# Patient Record
Sex: Female | Born: 1955 | Race: White | Hispanic: No | Marital: Married | Smoking: Never smoker
Health system: Southern US, Community
[De-identification: ages and names within clinical notes are randomized; demographics above are authoritative.]

## PROBLEM LIST (undated history)

## (undated) DIAGNOSIS — E079 Disorder of thyroid, unspecified: Secondary | ICD-10-CM

## (undated) DIAGNOSIS — I10 Essential (primary) hypertension: Secondary | ICD-10-CM

## (undated) DIAGNOSIS — H269 Unspecified cataract: Secondary | ICD-10-CM

## (undated) DIAGNOSIS — E785 Hyperlipidemia, unspecified: Secondary | ICD-10-CM

## (undated) DIAGNOSIS — K432 Incisional hernia without obstruction or gangrene: Secondary | ICD-10-CM

## (undated) DIAGNOSIS — F419 Anxiety disorder, unspecified: Secondary | ICD-10-CM

## (undated) DIAGNOSIS — Z8619 Personal history of other infectious and parasitic diseases: Secondary | ICD-10-CM

## (undated) DIAGNOSIS — E11620 Type 2 diabetes mellitus with diabetic dermatitis: Secondary | ICD-10-CM

## (undated) DIAGNOSIS — C649 Malignant neoplasm of unspecified kidney, except renal pelvis: Secondary | ICD-10-CM

## (undated) DIAGNOSIS — C801 Malignant (primary) neoplasm, unspecified: Secondary | ICD-10-CM

## (undated) DIAGNOSIS — G20A1 Parkinson's disease without dyskinesia, without mention of fluctuations: Secondary | ICD-10-CM

## (undated) DIAGNOSIS — G4730 Sleep apnea, unspecified: Secondary | ICD-10-CM

## (undated) DIAGNOSIS — D649 Anemia, unspecified: Secondary | ICD-10-CM

## (undated) DIAGNOSIS — M1990 Unspecified osteoarthritis, unspecified site: Secondary | ICD-10-CM

## (undated) DIAGNOSIS — K219 Gastro-esophageal reflux disease without esophagitis: Secondary | ICD-10-CM

## (undated) DIAGNOSIS — T7840XA Allergy, unspecified, initial encounter: Secondary | ICD-10-CM

## (undated) DIAGNOSIS — F32A Depression, unspecified: Secondary | ICD-10-CM

## (undated) HISTORY — DX: Disorder of thyroid, unspecified: E07.9

## (undated) HISTORY — DX: Hyperlipidemia, unspecified: E78.5

## (undated) HISTORY — PX: TUBAL LIGATION: SHX77

## (undated) HISTORY — DX: Type 2 diabetes mellitus with diabetic dermatitis: E11.620

## (undated) HISTORY — DX: Malignant (primary) neoplasm, unspecified: C80.1

## (undated) HISTORY — PX: JOINT REPLACEMENT: SHX530

## (undated) HISTORY — DX: Allergy, unspecified, initial encounter: T78.40XA

## (undated) HISTORY — PX: TONSILLECTOMY: SUR1361

## (undated) HISTORY — DX: Personal history of other infectious and parasitic diseases: Z86.19

## (undated) HISTORY — DX: Depression, unspecified: F32.A

## (undated) HISTORY — DX: Unspecified cataract: H26.9

## (undated) HISTORY — DX: Malignant neoplasm of unspecified kidney, except renal pelvis: C64.9

## (undated) HISTORY — DX: Anemia, unspecified: D64.9

---

## 1988-04-16 HISTORY — PX: OTHER SURGICAL HISTORY: SHX169

## 1989-04-16 HISTORY — PX: BREAST BIOPSY: SHX20

## 1992-04-16 HISTORY — PX: CHOLECYSTECTOMY: SHX55

## 1992-04-16 HISTORY — PX: APPENDECTOMY: SHX54

## 1992-04-16 HISTORY — PX: NEPHRECTOMY: SHX65

## 2004-09-19 ENCOUNTER — Ambulatory Visit: Payer: Self-pay | Admitting: Family Medicine

## 2004-09-22 ENCOUNTER — Ambulatory Visit: Payer: Self-pay | Admitting: Family Medicine

## 2005-04-30 ENCOUNTER — Ambulatory Visit: Payer: Self-pay | Admitting: Family Medicine

## 2006-09-25 ENCOUNTER — Ambulatory Visit: Payer: Self-pay | Admitting: Family Medicine

## 2007-07-16 ENCOUNTER — Ambulatory Visit: Payer: Self-pay | Admitting: Family Medicine

## 2007-07-16 DIAGNOSIS — I10 Essential (primary) hypertension: Secondary | ICD-10-CM | POA: Insufficient documentation

## 2007-07-16 DIAGNOSIS — E785 Hyperlipidemia, unspecified: Secondary | ICD-10-CM | POA: Insufficient documentation

## 2007-10-09 ENCOUNTER — Ambulatory Visit: Payer: Self-pay | Admitting: Family Medicine

## 2007-10-21 ENCOUNTER — Ambulatory Visit: Payer: Self-pay | Admitting: Family Medicine

## 2008-05-10 ENCOUNTER — Ambulatory Visit: Payer: Self-pay | Admitting: General Surgery

## 2008-08-06 ENCOUNTER — Ambulatory Visit: Payer: Self-pay | Admitting: Family Medicine

## 2008-08-06 DIAGNOSIS — F410 Panic disorder [episodic paroxysmal anxiety] without agoraphobia: Secondary | ICD-10-CM | POA: Insufficient documentation

## 2008-10-25 ENCOUNTER — Ambulatory Visit: Payer: Self-pay | Admitting: Family Medicine

## 2009-09-14 DIAGNOSIS — G4710 Hypersomnia, unspecified: Secondary | ICD-10-CM | POA: Insufficient documentation

## 2009-09-15 DIAGNOSIS — J309 Allergic rhinitis, unspecified: Secondary | ICD-10-CM | POA: Insufficient documentation

## 2009-09-15 DIAGNOSIS — B354 Tinea corporis: Secondary | ICD-10-CM | POA: Insufficient documentation

## 2009-11-07 ENCOUNTER — Ambulatory Visit: Payer: Self-pay | Admitting: Family Medicine

## 2009-12-21 ENCOUNTER — Ambulatory Visit: Payer: Self-pay | Admitting: Family Medicine

## 2011-01-01 ENCOUNTER — Ambulatory Visit: Payer: Self-pay | Admitting: Family Medicine

## 2011-02-12 ENCOUNTER — Ambulatory Visit: Payer: Self-pay | Admitting: Unknown Physician Specialty

## 2011-02-12 LAB — HM COLONOSCOPY

## 2012-05-20 ENCOUNTER — Ambulatory Visit: Payer: Self-pay | Admitting: Family Medicine

## 2012-05-20 LAB — HM MAMMOGRAPHY

## 2012-11-03 LAB — BASIC METABOLIC PANEL
BUN: 12 mg/dL (ref 4–21)
GLUCOSE: 106 mg/dL
POTASSIUM: 4.7 mmol/L (ref 3.4–5.3)
SODIUM: 139 mmol/L (ref 137–147)

## 2012-11-03 LAB — TSH: TSH: 3.61 u[IU]/mL (ref 0.41–5.90)

## 2014-01-20 LAB — BASIC METABOLIC PANEL: CREATININE: 1.1 mg/dL (ref <=1.1)

## 2014-01-20 LAB — LIPID PANEL
CHOLESTEROL: 163 mg/dL (ref 0–200)
HDL: 53 mg/dL (ref 35–70)
LDL Cholesterol: 88 mg/dL
TRIGLYCERIDES: 110 mg/dL (ref 40–160)

## 2014-01-20 LAB — HEMOGLOBIN A1C: Hgb A1c MFr Bld: 5.8 % (ref 4.0–6.0)

## 2014-09-22 ENCOUNTER — Encounter: Payer: Self-pay | Admitting: Family Medicine

## 2014-09-22 ENCOUNTER — Ambulatory Visit (INDEPENDENT_AMBULATORY_CARE_PROVIDER_SITE_OTHER): Payer: 59 | Admitting: Family Medicine

## 2014-09-22 VITALS — BP 110/70 | HR 120 | Temp 98.2°F | Resp 16 | Ht 60.0 in | Wt 228.0 lb

## 2014-09-22 DIAGNOSIS — C649 Malignant neoplasm of unspecified kidney, except renal pelvis: Secondary | ICD-10-CM | POA: Insufficient documentation

## 2014-09-22 DIAGNOSIS — R7303 Prediabetes: Secondary | ICD-10-CM | POA: Insufficient documentation

## 2014-09-22 DIAGNOSIS — E11620 Type 2 diabetes mellitus with diabetic dermatitis: Secondary | ICD-10-CM

## 2014-09-22 DIAGNOSIS — G4733 Obstructive sleep apnea (adult) (pediatric): Secondary | ICD-10-CM | POA: Insufficient documentation

## 2014-09-22 DIAGNOSIS — F410 Panic disorder [episodic paroxysmal anxiety] without agoraphobia: Secondary | ICD-10-CM

## 2014-09-22 DIAGNOSIS — N6310 Unspecified lump in the right breast, unspecified quadrant: Secondary | ICD-10-CM | POA: Insufficient documentation

## 2014-09-22 DIAGNOSIS — L299 Pruritus, unspecified: Secondary | ICD-10-CM | POA: Insufficient documentation

## 2014-09-22 DIAGNOSIS — L308 Other specified dermatitis: Secondary | ICD-10-CM | POA: Insufficient documentation

## 2014-09-22 HISTORY — DX: Type 2 diabetes mellitus with diabetic dermatitis: E11.620

## 2014-09-22 MED ORDER — ALPRAZOLAM 0.5 MG PO TABS: 0.5000 mg | ORAL_TABLET | ORAL | Status: DC | PRN

## 2014-09-22 MED ORDER — ALPRAZOLAM 0.5 MG PO TABS: 0.5000 mg | ORAL_TABLET | Freq: Every evening | ORAL | Status: DC | PRN

## 2014-09-22 NOTE — Progress Notes (Signed)
Patient: Brittany Benton Female    DOB: May 27, 1955   59 y.o.   MRN: [id number] Visit Date: 09/22/2014  Today's Provider: Lelon Huh, MD   Chief Complaint  Patient presents with  . Anxiety   Subjective:    Anxiety Presents for follow-up visit. Onset was in the past 7 days. The problem has been unchanged. Symptoms include nervous/anxious behavior and panic. Patient reports no chest pain, compulsions, confusion, decreased concentration, depressed mood, dizziness, dry mouth, excessive worry, feeling of choking, hyperventilation, impotence, insomnia, irritability, malaise, muscle tension, nausea, obsessions, palpitations, restlessness or suicidal ideas. Symptoms occur occasionally. The severity of symptoms is moderate. The symptoms are aggravated by work stress. The quality of sleep is good. Nighttime awakenings: occasional.   Risk factors include family history. Compliance with prior treatments has been good. Compliance with medications is 76-100%.   Has long history of anxiety an panic attacks which has been under complete control for over 15 years on imipramine, but started having a few minor panic attacks over the last few weeks. Had severe panic attack yesterday which didn't go away until going to sleep. Are exactly the same as previous panic attacks. Has never been on any other medications for panic attacks. Has been a little stressed at work, but no more than usual.    Previous Medications   AMLODIPINE (NORVASC) 5 MG TABLET    Take 1 tablet by mouth daily.   ASPIRIN 81 MG TABLET    Take 1 tablet by mouth daily.   ATORVASTATIN (LIPITOR) 10 MG TABLET    Take 1 tablet by mouth daily.   CLOTRIMAZOLE-BETAMETHASONE (LOTRISONE) CREAM    Apply 1 application topically 2 (two) times daily as needed. to affected area   FLUTICASONE (FLONASE) 50 MCG/ACT NASAL SPRAY    Place 2 sprays into both nostrils daily as needed.   GLUCOSE BLOOD TEST STRIP    Check blood sugar daily   IMIPRAMINE (TOFRANIL) 25 MG  TABLET    Take 1 tablet by mouth daily.   IMIPRAMINE (TOFRANIL) 50 MG TABLET    Take 1 tablet by mouth daily.   MULTIPLE VITAMINS-MINERALS (MULTIVITAMIN ADULT PO)    Take 1 tablet by mouth daily.    Review of Systems  Constitutional: Negative for irritability.  Cardiovascular: Negative for chest pain and palpitations.  Gastrointestinal: Negative for nausea.  Genitourinary: Negative for impotence.  Neurological: Negative for dizziness.  Psychiatric/Behavioral: Negative for suicidal ideas, confusion and decreased concentration. The patient is nervous/anxious. The patient does not have insomnia.     History  Substance Use Topics  . Smoking status: Never Smoker   . Smokeless tobacco: Not on file  . Alcohol Use: 0.0 oz/week    0 Standard drinks or equivalent per week     Comment: occasional use   Objective:   BP 110/70 mmHg  Pulse 120  Temp(Src) 98.2 F (36.8 C) (Oral)  Resp 16  Ht 5' (1.524 m)  Wt 228 lb (103.42 kg)  BMI 44.53 kg/m2  SpO2 96%  Physical Exam  General appearance: alert, well developed, well nourished, cooperative and in no distress Head: Normocephalic, without obvious abnormality, atraumatic Lungs: Respirations even and unlabored Extremities: No gross deformities Skin: Skin color, texture, turgor normal. No rashes seen  Psych: Appropriate mood and affect. Neurologic: Mental status: Alert, oriented to person, place, and time, thought content appropriate.     Assessment & Plan:     1. Panic disorder Worsening over the last few weeks. She has been taking  imipramine for many years and would like to continue, but go up to 50mg  in the morning for now. Consider titration up further, consider SSRI. Will cover with prn benzo for now.  - ALPRAZolam (XANAX) 0.5 MG tablet; Take 1 tablet (0.5 mg total) by mouth every 4 (four) hours as needed for anxiety.  Dispense: 15 tablet; Refill: 0   Given work excuse until her appointment next week.     Follow up: Return in  about 1 week (around 09/29/2014).

## 2014-09-29 ENCOUNTER — Encounter: Payer: Self-pay | Admitting: Family Medicine

## 2014-09-29 ENCOUNTER — Ambulatory Visit (INDEPENDENT_AMBULATORY_CARE_PROVIDER_SITE_OTHER): Payer: 59 | Admitting: Family Medicine

## 2014-09-29 VITALS — BP 120/78 | HR 124 | Temp 98.5°F | Resp 16 | Ht 61.0 in | Wt 230.0 lb

## 2014-09-29 DIAGNOSIS — Z6841 Body Mass Index (BMI) 40.0 and over, adult: Secondary | ICD-10-CM

## 2014-09-29 DIAGNOSIS — Z23 Encounter for immunization: Secondary | ICD-10-CM

## 2014-09-29 DIAGNOSIS — R7303 Prediabetes: Secondary | ICD-10-CM

## 2014-09-29 DIAGNOSIS — R7309 Other abnormal glucose: Secondary | ICD-10-CM

## 2014-09-29 DIAGNOSIS — E559 Vitamin D deficiency, unspecified: Secondary | ICD-10-CM

## 2014-09-29 DIAGNOSIS — Z01419 Encounter for gynecological examination (general) (routine) without abnormal findings: Secondary | ICD-10-CM | POA: Diagnosis not present

## 2014-09-29 DIAGNOSIS — I10 Essential (primary) hypertension: Secondary | ICD-10-CM

## 2014-09-29 NOTE — Patient Instructions (Signed)

## 2014-09-29 NOTE — Progress Notes (Signed)
Patient: Brittany Benton, Female    DOB: 1955-12-13, 59 y.o.   MRN: [id number] Visit Date: 09/29/2014  Today's Provider: Lelon Huh, MD   Chief Complaint  Patient presents with  . Annual Exam  . Hypertension    follow up  . Panic Attack    follow up   Subjective:    Annual physical exam Brittany Benton is a 59 y.o. female who presents today for health maintenance and complete physical. She feels well. She reports exercising never. She reports she is sleeping fairly well.  -----------------------------------------------------------------  Hypertension, follow-up:  BP Readings from Last 3 Encounters:  09/29/14 120/78  09/22/14 110/70  01/29/14 112/74    She was last seen for hypertension 7 months ago.  BP at that visit was  110/70. Management changes since that visit include none. She reports good compliance with treatment. She is not having side effects.  She is not exercising. She is not adherent to low salt diet.   Outside blood pressures are  Not being checked. She is experiencing none.  Patient denies chest pain, chest pressure/discomfort, claudication, dyspnea, exertional chest pressure/discomfort, fatigue, irregular heart beat, lower extremity edema and near-syncope.   Cardiovascular risk factors include advanced age (older than 61 for men, 42 for women).  Use of agents associated with hypertension: none.    Weight trend: stable Current diet: in general, an "unhealthy" diet  ------------------------------------------------------------------------   Panic Disorder Follow up:   Patient was last seen 09/22/2014. Changes made during this visit includes increasing Imipramine to 50mg  every morning. Patient was also started on Xanax as needed and advised to recheck in 1 week. Patient comes in today reporting symptoms have greatly improved but she is still not 100% better.  She has only taken Xanax three times and does not thinks she will need to take it much in the  future.   Review of Systems  Constitutional: Negative.   HENT: Positive for tinnitus. Negative for congestion, ear pain, facial swelling, hearing loss, rhinorrhea and sinus pressure.   Eyes: Negative.   Respiratory: Negative.   Cardiovascular: Negative.   Gastrointestinal: Negative.   Endocrine: Negative.   Genitourinary: Negative.   Musculoskeletal: Positive for arthralgias (right knee pain).  Allergic/Immunologic: Negative.   Neurological: Negative.   All other systems reviewed and are negative.   Social History She  reports that she has never smoked. She does not have any smokeless tobacco history on file. She reports that she drinks alcohol. She reports that she does not use illicit drugs.  Patient Active Problem List   Diagnosis Date Noted  . Vitamin D deficiency 09/29/2014  . 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 Lump of right breast 09/22/2014  . Prediabetes 09/22/2014  . Pruritic dermatitis 09/22/2014  . Obstructive sleep apnea 09/22/2014  . Allergic rhinitis 09/15/2009  . Hypersomnia 09/14/2009  . Panic disorder 08/06/2008  . BMI 40.0-44.9, adult 09/08/2007  . Essential (primary) hypertension 07/16/2007  . Hyperlipidemia 07/16/2007    Past Surgical History  Procedure Laterality Date  . Appendectomy  1994  . Cholecystectomy  1994  . Nephrectomy  1994    Renal Cell Carcinoma  . Breast biopsy  1991  . Parotid gland removal  1990    also removed a tumor    Family History Her family history includes Dementia in her mother; Heart disease in her father; Osteoporosis in her mother.    Previous Medications   ALPRAZOLAM (XANAX) 0.5 MG TABLET    Take 1 tablet (0.5 mg  total) by mouth every 4 (four) hours as needed for anxiety.   AMLODIPINE (NORVASC) 5 MG TABLET    Take 1 tablet by mouth daily.   ASPIRIN 81 MG TABLET    Take 1 tablet by mouth daily.   ATORVASTATIN (LIPITOR) 10 MG TABLET    Take 1 tablet by mouth daily.   CLOTRIMAZOLE-BETAMETHASONE (LOTRISONE)  CREAM    Apply 1 application topically 2 (two) times daily as needed. to affected area   FLUTICASONE (FLONASE) 50 MCG/ACT NASAL SPRAY    Place 2 sprays into both nostrils daily as needed.   GLUCOSE BLOOD TEST STRIP    Check blood sugar daily   IMIPRAMINE (TOFRANIL) 50 MG TABLET    Take 1 tablet by mouth 2 (two) times daily.    MULTIPLE VITAMINS-MINERALS (MULTIVITAMIN ADULT PO)    Take 1 tablet by mouth daily.    Patient Care Team: Birdie Sons, MD as PCP - General (Family Medicine)     Objective:   Vitals: BP 120/78 mmHg  Pulse 124  Temp(Src) 98.5 F (36.9 C) (Oral)  Resp 16  Ht 5\' 1"  (1.549 m)  Wt 230 lb (104.327 kg)  BMI 43.48 kg/m2  SpO2 97%   Physical Exam BP 120/78 mmHg  Pulse 124  Temp(Src) 98.5 F (36.9 C) (Oral)  Resp 16  Ht 5\' 1"  (1.549 m)  Wt 230 lb (104.327 kg)  BMI 43.48 kg/m2  SpO2 97%  General Appearance:    Alert, cooperative, no distress, appears stated age, morbidly obese  Head:    Normocephalic, without obvious abnormality, atraumatic  Eyes:    PERRL, conjunctiva/corneas clear, EOM's intact, fundi    benign, both eyes  Ears:    Normal TM's and external ear canals, both ears  Nose:   Nares normal, septum midline, mucosa normal, no drainage    or sinus tenderness  Throat:   Lips, mucosa, and tongue normal; teeth and gums normal  Neck:   Supple, symmetrical, trachea midline, no adenopathy;    thyroid:  no enlargement/tenderness/nodules; no carotid   bruit or JVD  Back:     Symmetric, no curvature, ROM normal, no CVA tenderness  Lungs:     Clear to auscultation bilaterally, respirations unlabored  Chest Wall:    No tenderness or deformity   Heart:    Regular rate and rhythm, S1 and S2 normal, no murmur, rub   or gallop  Breast Exam:    No tenderness, masses, or nipple abnormality  Abdomen:     Soft, non-tender, bowel sounds active all four quadrants,    no masses, no organomegaly  Genitalia:    Normal female without lesion, discharge or tenderness      Extremities:   Extremities normal, atraumatic, no cyanosis or edema  Pulses:   2+ and symmetric all extremities  Skin:   Skin color, texture, turgor normal, no rashes or lesions  Lymph nodes:   Cervical, supraclavicular, and axillary nodes normal  Neurologic:   CNII-XII intact, normal strength, sensation and reflexes    throughout    Depression Screen PHQ 2/9 Scores 09/29/2014  PHQ - 2 Score 0  PHQ- 9 Score 0      Assessment & Plan:     Routine Health Maintenance and Physical Exam   Immunization History  Administered Date(s) Administered  . Tdap 09/29/2014  . Zoster 09/29/2014    Health Maintenance  Topic Date Due  . PNEUMOCOCCAL POLYSACCHARIDE VACCINE (1) 07/17/1955  . FOOT EXAM  07/17/1963  .  OPHTHALMOLOGY EXAM  07/17/1963  . URINE MICROALBUMIN  07/17/1963  . HIV Screening  07/16/1968  . PAP SMEAR  07/17/1971  . TETANUS/TDAP  07/16/1972  . ZOSTAVAX  07/16/2013  . MAMMOGRAM  05/20/2014  . HEMOGLOBIN A1C  07/22/2014  . INFLUENZA VACCINE  11/15/2014  . COLONOSCOPY  02/11/2021      Discussed health benefits of physical activity, and encouraged her to engage in regular exercise appropriate for her age and condition.    --------------------------------------------------------------------  1. Well woman exam with routine gynecological exam  - Pap IG and HPV (high risk) DNA detection  2. Prediabetes  - TSH - Hemoglobin A1c  3. Essential (primary) hypertension well controlled Continue current medications.   - EKG 12-Lead  4. BMI 40.0-44.9, adult Encouraged healthy diet and exercise.   5. Vitamin D deficiency  - Vit D  25 hydroxy (rtn osteoporosis monitoring)  6. Panic disorder - Doing much better with increase in imipramine and occasional alprazolam

## 2014-09-30 LAB — HEMOGLOBIN A1C
Est. average glucose Bld gHb Est-mCnc: 131 mg/dL
Hgb A1c MFr Bld: 6.2 % — ABNORMAL HIGH (ref 4.8–5.6)

## 2014-09-30 LAB — TSH: TSH: 3.41 u[IU]/mL (ref 0.450–4.500)

## 2014-09-30 LAB — VITAMIN D 25 HYDROXY (VIT D DEFICIENCY, FRACTURES): Vit D, 25-Hydroxy: 29.5 ng/mL — ABNORMAL LOW (ref 30.0–100.0)

## 2014-10-01 ENCOUNTER — Encounter: Payer: Self-pay | Admitting: Family Medicine

## 2014-10-01 ENCOUNTER — Other Ambulatory Visit: Payer: Self-pay | Admitting: Family Medicine

## 2014-10-01 LAB — PAP IG AND HPV HIGH-RISK: PAP SMEAR COMMENT: 0

## 2014-10-01 MED ORDER — CEPHALEXIN 500 MG PO CAPS: 500.0000 mg | ORAL_CAPSULE | Freq: Four times a day (QID) | ORAL | Status: AC

## 2014-10-05 ENCOUNTER — Ambulatory Visit (INDEPENDENT_AMBULATORY_CARE_PROVIDER_SITE_OTHER): Payer: 59 | Admitting: Family Medicine

## 2014-10-05 ENCOUNTER — Telehealth: Payer: Self-pay | Admitting: Family Medicine

## 2014-10-05 ENCOUNTER — Encounter: Payer: Self-pay | Admitting: Family Medicine

## 2014-10-05 VITALS — BP 140/82 | HR 120 | Temp 97.7°F | Resp 17

## 2014-10-05 DIAGNOSIS — F410 Panic disorder [episodic paroxysmal anxiety] without agoraphobia: Secondary | ICD-10-CM

## 2014-10-05 MED ORDER — SERTRALINE HCL 25 MG PO TABS: ORAL_TABLET | ORAL | Status: DC

## 2014-10-05 NOTE — Telephone Encounter (Signed)
Called pt and scheduled her to come in today at 3:45 this afternoon. Thanks TNP

## 2014-10-05 NOTE — Telephone Encounter (Signed)
The 3:45 slot is open today.

## 2014-10-05 NOTE — Progress Notes (Signed)
Patient: Brittany Benton Female    DOB: 1955-05-29   59 y.o.   MRN: [id number] Visit Date: 10/05/2014  Today's Provider: Lelon Huh, MD   No chief complaint on file.  Subjective:    Anxiety Presents for follow-up visit. Onset was more than 5 years ago. The problem has been unchanged. Symptoms include nervous/anxious behavior and panic. Patient reports no chest pain, dizziness, excessive worry, hyperventilation, insomnia, irritability, palpitations or shortness of breath. Symptoms occur occasionally. The severity of symptoms is severe and interfering with daily activities. The symptoms are aggravated by social activities and work stress. The quality of sleep is good. Nighttime awakenings: none.   Her past medical history is significant for anxiety/panic attacks. The treatment provided mild relief. Compliance with prior treatments has been good.    She states her anxiety had been much better after increasing dose of imipramine earlier this month. She had been out of work to to panic attack, but had no panic attacks when she was out of work. On her first day back to work she started to have panic attack early in the afternoon and had to go home. She is very anxious about return to work as she feels this is a likely trigger.   Previous Medications   ALPRAZOLAM (XANAX) 0.5 MG TABLET    Take 1 tablet (0.5 mg total) by mouth every 4 (four) hours as needed for anxiety.   AMLODIPINE (NORVASC) 5 MG TABLET    Take 1 tablet by mouth daily.   ASPIRIN 81 MG TABLET    Take 1 tablet by mouth daily.   ATORVASTATIN (LIPITOR) 10 MG TABLET    Take 1 tablet by mouth daily.   CEPHALEXIN (KEFLEX) 500 MG CAPSULE    Take 1 capsule (500 mg total) by mouth 4 (four) times daily.   CLOTRIMAZOLE-BETAMETHASONE (LOTRISONE) CREAM    Apply 1 application topically 2 (two) times daily as needed. to affected area   FLUTICASONE (FLONASE) 50 MCG/ACT NASAL SPRAY    Place 2 sprays into both nostrils daily as needed.   GLUCOSE  BLOOD TEST STRIP    Check blood sugar daily   IMIPRAMINE (TOFRANIL) 50 MG TABLET    Take 1 tablet by mouth 2 (two) times daily.    MULTIPLE VITAMINS-MINERALS (MULTIVITAMIN ADULT PO)    Take 1 tablet by mouth daily.    Review of Systems  Constitutional: Negative for irritability.  Respiratory: Negative for shortness of breath.   Cardiovascular: Negative for chest pain and palpitations.  Neurological: Negative for dizziness.  Psychiatric/Behavioral: The patient is nervous/anxious. The patient does not have insomnia.     History  Substance Use Topics  . Smoking status: Never Smoker   . Smokeless tobacco: Not on file  . Alcohol Use: 0.0 oz/week    0 Standard drinks or equivalent per week     Comment: occasional use   Objective:   BP 140/82 mmHg  Pulse 120  Temp(Src) 97.7 F (36.5 C) (Oral)  Resp 17  SpO2 97%  Physical Exam  General Appearance:    Alert, cooperative, no distress  Eyes:    PERRL, conjunctiva/corneas clear, EOM's intact       Lungs:     Clear to auscultation bilaterally, respirations unlabored  Heart:    Regular rate and rhythm  Neurologic:   Awake, alert, oriented x 3. No apparent focal neurological           defect. Anxious at times when discussing work stresses.  Assessment & Plan:     1. Panic disorder Long discussion regarding counseling versus adding SSRI to regiment. She wants to stay out of work until she is better. Will start on low dose of sertraline and titrate slowly to 50mg  daily. Work excuse to return on 7-5--16. Follow up here in 3 weeks. Call any trouble with medications.  Spent over half of this 25 minute visit in counseling and coordination of care.   Follow up: 3 weeks.

## 2014-10-05 NOTE — Telephone Encounter (Signed)
Pt would like to see you today and wanted to see if you can work her in today. Pt stated that when she went back to work she started having panic attacks and anxiety. Pt stated it only happens when she is at work. Pt wanted to discuss being written out of work for a little longer. Thanks TNP

## 2014-10-08 ENCOUNTER — Telehealth: Payer: Self-pay

## 2014-10-08 NOTE — Telephone Encounter (Signed)
-----   Message from Birdie Sons, MD sent at 10/07/2014  3:20 PM EDT ----- Please advise patient that pap test is negative. Repeat in 3-4 years.

## 2014-10-08 NOTE — Telephone Encounter (Signed)
Pt advised as directed below.   Thanks,   -Oskar Cretella  

## 2014-10-22 ENCOUNTER — Telehealth: Payer: Self-pay | Admitting: Family Medicine

## 2014-10-22 ENCOUNTER — Other Ambulatory Visit: Payer: Self-pay | Admitting: Family Medicine

## 2014-10-22 NOTE — Telephone Encounter (Signed)
Brittany Benton wanted to let Dr. Caryn Section know they need the information for pt's short term disability by Monday 10/25/14. Brittany Benton stated it has been faxed multiple times and wanted to know if our office received it and need it back by Monday. Fax# [phone]. Thanks TNP

## 2014-10-25 ENCOUNTER — Ambulatory Visit (INDEPENDENT_AMBULATORY_CARE_PROVIDER_SITE_OTHER): Payer: 59 | Admitting: Family Medicine

## 2014-10-25 ENCOUNTER — Encounter: Payer: Self-pay | Admitting: Family Medicine

## 2014-10-25 VITALS — BP 110/70 | HR 117 | Temp 98.3°F | Resp 16 | Wt 229.0 lb

## 2014-10-25 DIAGNOSIS — F410 Panic disorder [episodic paroxysmal anxiety] without agoraphobia: Secondary | ICD-10-CM | POA: Diagnosis not present

## 2014-10-25 MED ORDER — SERTRALINE HCL 50 MG PO TABS: 75.0000 mg | ORAL_TABLET | Freq: Every day | ORAL | Status: DC

## 2014-10-25 NOTE — Progress Notes (Signed)
Patient: Brittany Benton Female    DOB: [birth year]   59 y.o.   MRN: [id number] Visit Date: 10/25/2014  Today's Provider: Lelon Huh, MD   Chief Complaint  Patient presents with  . Follow-up    3 weeks  . Panic Attack   Subjective:    HPI   Follow-up for panic attacks, she was last seen 10/05/14 and was advised to start on low dose of sertraline and titrate slowly to 50mg  daily. She did have one panic attack a few days after that visit,  but has had none since. She states she has had a few minor dizzy spells and felt a little car sick one time after starting sertraline, but overall she feels much better. She still gets a little anxious from time to time, but she was able to return to work on 10-19-2014 and is not having any significant difficulty.   Allergies  Allergen Reactions  . Codeine   . Morphine    Previous Medications   ALPRAZOLAM (XANAX) 0.5 MG TABLET    Take 1 tablet (0.5 mg total) by mouth every 4 (four) hours as needed for anxiety.   AMLODIPINE (NORVASC) 5 MG TABLET    Take 1 tablet by mouth daily.   ASPIRIN 81 MG TABLET    Take 1 tablet by mouth daily.   ATORVASTATIN (LIPITOR) 10 MG TABLET    Take 1 tablet by mouth daily.   CLOTRIMAZOLE-BETAMETHASONE (LOTRISONE) CREAM    Apply 1 application topically 2 (two) times daily as needed. to affected area   FLUTICASONE (FLONASE) 50 MCG/ACT NASAL SPRAY    Place 2 sprays into both nostrils daily as needed.   GLUCOSE BLOOD TEST STRIP    Check blood sugar daily   IMIPRAMINE (TOFRANIL) 50 MG TABLET    Take 1 tablet (50 mg total) by mouth 2 (two) times daily.   MULTIPLE VITAMINS-MINERALS (MULTIVITAMIN ADULT PO)    Take 1 tablet by mouth daily.   SERTRALINE (ZOLOFT) 50 MG TABLET    Take 1 tablet (50 mg total) by mouth daily.    Review of Systems  Constitutional: Negative for fatigue.  HENT: Positive for tinnitus. Negative for drooling and ear discharge.   Respiratory: Positive for chest tightness. Negative for wheezing.     Cardiovascular: Negative for chest pain, palpitations and leg swelling.  Gastrointestinal: Negative for abdominal pain.  Neurological: Positive for dizziness, light-headedness and headaches.    History  Substance Use Topics  . Smoking status: Never Smoker   . Smokeless tobacco: Not on file  . Alcohol Use: 0.0 oz/week    0 Standard drinks or equivalent per week     Comment: occasional use   Objective:   BP 110/70 mmHg  Pulse 117  Temp(Src) 98.3 F (36.8 C) (Oral)  Resp 16  Wt 229 lb (103.874 kg)  SpO2 97%  Physical Exam  General appearance: alert, well developed, well nourished, cooperative and in no distress Head: Normocephalic, without obvious abnormality, atraumatic Lungs: Respirations even and unlabored Extremities: No gross deformities Skin: Skin color, texture, turgor normal. No rashes seen  Psych: Appropriate mood and affect. Neurologic: Mental status: Alert, oriented to person, place, and time, thought content appropriate.     Assessment & Plan:     1. Panic disorder Doing much better, although still gets anxious every few days. Will increase sertraline to 1 1/2 x 50mg  a day and follow up in a month.     Follow up: Return  in about 4 weeks (around 11/22/2014).      Lelon Huh, MD  [facility]

## 2014-10-27 ENCOUNTER — Other Ambulatory Visit: Payer: Self-pay | Admitting: Family Medicine

## 2014-10-27 DIAGNOSIS — Z1231 Encounter for screening mammogram for malignant neoplasm of breast: Secondary | ICD-10-CM

## 2014-11-01 ENCOUNTER — Ambulatory Visit
Admission: RE | Admit: 2014-11-01 | Discharge: 2014-11-01 | Disposition: A | Discharge status: Home / Self Care | Payer: 59 | Source: Ambulatory Visit | Attending: Family Medicine | Admitting: Family Medicine

## 2014-11-01 DIAGNOSIS — Z1231 Encounter for screening mammogram for malignant neoplasm of breast: Secondary | ICD-10-CM

## 2014-11-16 ENCOUNTER — Other Ambulatory Visit: Payer: Self-pay | Admitting: Family Medicine

## 2014-11-16 DIAGNOSIS — F410 Panic disorder [episodic paroxysmal anxiety] without agoraphobia: Secondary | ICD-10-CM

## 2014-11-16 MED ORDER — SERTRALINE HCL 50 MG PO TABS: 75.0000 mg | ORAL_TABLET | Freq: Every day | ORAL | Status: DC

## 2014-11-29 ENCOUNTER — Ambulatory Visit (INDEPENDENT_AMBULATORY_CARE_PROVIDER_SITE_OTHER): Payer: 59 | Admitting: Family Medicine

## 2014-11-29 ENCOUNTER — Encounter: Payer: Self-pay | Admitting: Family Medicine

## 2014-11-29 VITALS — BP 120/70 | HR 119 | Temp 98.1°F | Resp 18 | Wt 230.0 lb

## 2014-11-29 DIAGNOSIS — F410 Panic disorder [episodic paroxysmal anxiety] without agoraphobia: Secondary | ICD-10-CM

## 2014-11-29 NOTE — Progress Notes (Signed)
       Patient: Brittany Benton Female    DOB: 14-Apr-1956   59 y.o.   MRN: [id number] Visit Date: 11/29/2014  Today's Provider: Lelon Huh, MD   Chief Complaint  Patient presents with  . Panic Attack    follow up   Subjective:    HPI Panic Disorder follow up: Last office visit was 4 weeks ago. Changes made during this visit includes increasing Sertraline 50mg  to 1.5 tablets daily. Today patient comes in stating her panic attacks have improved significantly since her last visit. Patient states up until today she has not had any episodes of panic attacks. She states she did have one today while at the [facility] doctor. She states she took a Xanax and symptoms resolved. Patient reports good compliance with treatment, good tolerance and good symptom control.     Allergies  Allergen Reactions  . Codeine   . Morphine    Previous Medications   ALPRAZOLAM (XANAX) 0.5 MG TABLET    Take 1 tablet (0.5 mg total) by mouth every 4 (four) hours as needed for anxiety.   AMLODIPINE (NORVASC) 5 MG TABLET    Take 1 tablet by mouth daily.   ASPIRIN 81 MG TABLET    Take 1 tablet by mouth daily.   ATORVASTATIN (LIPITOR) 10 MG TABLET    Take 1 tablet by mouth daily.   CLOTRIMAZOLE-BETAMETHASONE (LOTRISONE) CREAM    Apply 1 application topically 2 (two) times daily as needed. to affected area   FLUTICASONE (FLONASE) 50 MCG/ACT NASAL SPRAY    Place 2 sprays into both nostrils daily as needed.   GLUCOSE BLOOD TEST STRIP    Check blood sugar daily   IMIPRAMINE (TOFRANIL) 50 MG TABLET    Take 1 tablet (50 mg total) by mouth 2 (two) times daily.   MULTIPLE VITAMINS-MINERALS (MULTIVITAMIN ADULT PO)    Take 1 tablet by mouth daily.   SERTRALINE (ZOLOFT) 50 MG TABLET    Take 1.5 tablets (75 mg total) by mouth daily.    Review of Systems  Psychiatric/Behavioral: The patient is nervous/anxious (one episode of panic attack today).     Social History  Substance Use Topics  . Smoking status: Never Smoker   .  Smokeless tobacco: Not on file  . Alcohol Use: 0.0 oz/week    0 Standard drinks or equivalent per week     Comment: occasional use   Objective:   BP 120/70 mmHg  Pulse 119  Temp(Src) 98.1 F (36.7 C) (Oral)  Resp 18  Wt 230 lb (104.327 kg)  SpO2 97%  Physical Exam  General appearance: alert, well developed, obese, cooperative and in no distress Head: Normocephalic, without obvious abnormality, atraumatic Lungs: Respirations even and unlabored Extremities: No gross deformities Skin: Skin color, texture, turgor normal. No rashes seen  Psych: Appropriate mood and affect. Neurologic: Mental status: Alert, oriented to person, place, and time, thought content appropriate.     Assessment & Plan:     1. Panic disorder Doing very well with sertraline and rarely taking alprazolam. Continue current medications.  She is planning on retiring in December and would like to continue on sertraline until then. Will have her follow up in January and consider weaning SSRI at that time.       Lelon Huh, MD  [facility]

## 2014-11-30 LAB — HM DIABETES EYE EXAM

## 2014-12-02 ENCOUNTER — Other Ambulatory Visit: Payer: Self-pay | Admitting: Family Medicine

## 2014-12-24 ENCOUNTER — Other Ambulatory Visit: Payer: Self-pay | Admitting: Family Medicine

## 2014-12-24 DIAGNOSIS — F410 Panic disorder [episodic paroxysmal anxiety] without agoraphobia: Secondary | ICD-10-CM

## 2014-12-24 MED ORDER — SERTRALINE HCL 50 MG PO TABS: 75.0000 mg | ORAL_TABLET | Freq: Every day | ORAL | Status: DC

## 2015-01-21 ENCOUNTER — Other Ambulatory Visit: Payer: Self-pay | Admitting: *Deleted

## 2015-01-21 MED ORDER — IMIPRAMINE HCL 50 MG PO TABS: 50.0000 mg | ORAL_TABLET | Freq: Two times a day (BID) | ORAL | Status: DC

## 2015-01-28 ENCOUNTER — Other Ambulatory Visit: Payer: Self-pay | Admitting: *Deleted

## 2015-01-29 MED ORDER — AMLODIPINE BESYLATE 5 MG PO TABS: 5.0000 mg | ORAL_TABLET | Freq: Every day | ORAL | Status: DC

## 2015-02-15 ENCOUNTER — Encounter: Payer: Self-pay | Admitting: Family Medicine

## 2015-02-15 ENCOUNTER — Ambulatory Visit (INDEPENDENT_AMBULATORY_CARE_PROVIDER_SITE_OTHER): Payer: 59 | Admitting: Family Medicine

## 2015-02-15 VITALS — BP 122/72 | Temp 97.9°F | Resp 20 | Wt 231.0 lb

## 2015-02-15 DIAGNOSIS — M25562 Pain in left knee: Secondary | ICD-10-CM | POA: Insufficient documentation

## 2015-02-15 MED ORDER — NAPROXEN 500 MG PO TABS: 500.0000 mg | ORAL_TABLET | Freq: Two times a day (BID) | ORAL | Status: DC

## 2015-02-15 NOTE — Progress Notes (Signed)
Patient: Brittany Benton Female    DOB: 05/23/1955   59 y.o.   MRN: [id number] Visit Date: 02/15/2015  Today's Provider: Lelon Huh, MD   Chief Complaint  Patient presents with  . Knee Pain   Subjective:    Knee Pain  There was no injury mechanism. The pain is present in the right knee and left knee. The pain has been constant since onset. Associated symptoms include an inability to bear weight. She has tried NSAIDs (Aleve) for the symptoms. The treatment provided mild relief.  Patient states she has had pain in her right knee for 1 year. She has taken Advil for this ongoing pain and it seems to relieve the pain. Pain worsens when walking on [place]. Patient states in the past week she has developed pain in her left knee also that feel different. Patient describes the left knee pain as constant pressure that occurs when every she stands up. A little worse when walking, especially going up stairs. . Patient has tried taking Aleve for this knee also and it does not help with relieving pain.     Allergies  Allergen Reactions  . Codeine   . Morphine    Previous Medications   ALPRAZOLAM (XANAX) 0.5 MG TABLET    Take 1 tablet (0.5 mg total) by mouth every 4 (four) hours as needed for anxiety.   AMLODIPINE (NORVASC) 5 MG TABLET    Take 1 tablet (5 mg total) by mouth daily.   ASPIRIN 81 MG TABLET    Take 1 tablet by mouth daily.   ATORVASTATIN (LIPITOR) 10 MG TABLET    Take 1 tablet by mouth daily.   CLOTRIMAZOLE-BETAMETHASONE (LOTRISONE) CREAM    Apply 1 application topically 2 (two) times daily as needed. to affected area   FLUTICASONE (FLONASE) 50 MCG/ACT NASAL SPRAY    Place 2 sprays into both nostrils daily as needed.   IMIPRAMINE (TOFRANIL) 50 MG TABLET    Take 1 tablet (50 mg total) by mouth 2 (two) times daily.   MULTIPLE VITAMINS-MINERALS (MULTIVITAMIN ADULT PO)    Take 1 tablet by mouth daily.   SERTRALINE (ZOLOFT) 50 MG TABLET    Take 1.5 tablets (75 mg total) by mouth  daily.    Review of Systems  Constitutional: Negative for fever, chills, diaphoresis, appetite change and fatigue.  Respiratory: Negative for chest tightness and shortness of breath.   Cardiovascular: Negative for chest pain and palpitations.  Gastrointestinal: Negative for nausea, vomiting and abdominal pain.  Musculoskeletal: Positive for joint swelling (in left knee), arthralgias (bilateral knee pain) and gait problem.  Neurological: Negative for dizziness and weakness.    Social History  Substance Use Topics  . Smoking status: Never Smoker   . Smokeless tobacco: Not on file  . Alcohol Use: 0.0 oz/week    0 Standard drinks or equivalent per week     Comment: occasional use   Objective:   BP 122/72 mmHg  Temp(Src) 97.9 F (36.6 C) (Oral)  Resp 20  Wt 231 lb (104.781 kg)  SpO2 97%  Physical Exam  General appearance: alert, well developed, well nourished, cooperative and in no distress Head: Normocephalic, without obvious abnormality, atraumatic Lungs: Respirations even and unlabored Extremities: No gross deformities Skin: Skin color, texture, turgor normal. No rashes seen  Psych: Appropriate mood and affect. Neurologic: Mental status: Alert, oriented to person, place, and time, thought content appropriate. MS: Slight swelling around patella. Tender along medial and superior aspect of  left patella.     Assessment & Plan:     1. Left knee pain No recent trauma or injury. Likely OA. Try NSAID and OTC elastic knee brace. Call for Xrays if not improving in 3-4 days.  - naproxen (NAPROSYN) 500 MG tablet; Take 1 tablet (500 mg total) by mouth 2 (two) times daily with a meal.  Dispense: 30 tablet; Refill: 0        Lelon Huh, MD  [facility]

## 2015-02-15 NOTE — Patient Instructions (Signed)
Wear an elastic knee brace during the day.   Call for order for knee Xray if not much better by the end of the week.

## 2015-02-17 ENCOUNTER — Ambulatory Visit (INDEPENDENT_AMBULATORY_CARE_PROVIDER_SITE_OTHER): Payer: 59 | Admitting: Family Medicine

## 2015-02-17 ENCOUNTER — Encounter: Payer: Self-pay | Admitting: Family Medicine

## 2015-02-17 VITALS — BP 122/72 | HR 125 | Temp 97.6°F | Resp 20 | Wt 230.0 lb

## 2015-02-17 DIAGNOSIS — M25561 Pain in right knee: Secondary | ICD-10-CM

## 2015-02-17 NOTE — Patient Instructions (Signed)
Apply ice to back of knee for 8-10 minutes every four hours today and Friday. Continue to wear knee wrap and take naprosyn.

## 2015-02-17 NOTE — Progress Notes (Signed)
Patient: Brittany Benton Female    DOB: October 25, 1955   59 y.o.   MRN: [id number] Visit Date: 02/17/2015  Today's Provider: Lelon Huh, MD   Chief Complaint  Patient presents with  . Knee Pain    left knee   Subjective:    HPI   Patient was walking to parking lot yesterday when she had sudden onset of pain in the back of her left leg and knee after stepping down off of curb. He knee felt like it was frozen and she could hardly move it. Pain has persisted off and on since then. She was last seen 02/15/2015 for arthritic left knee pain and was started on Naprosyn and OTC elastic knee brace, and had improved until incident occurred yesterday.     Allergies  Allergen Reactions  . Codeine   . Morphine    Previous Medications   ALPRAZOLAM (XANAX) 0.5 MG TABLET    Take 1 tablet (0.5 mg total) by mouth every 4 (four) hours as needed for anxiety.   AMLODIPINE (NORVASC) 5 MG TABLET    Take 1 tablet (5 mg total) by mouth daily.   ASPIRIN 81 MG TABLET    Take 1 tablet by mouth daily.   ATORVASTATIN (LIPITOR) 10 MG TABLET    Take 1 tablet by mouth daily.   CLOTRIMAZOLE-BETAMETHASONE (LOTRISONE) CREAM    Apply 1 application topically 2 (two) times daily as needed. to affected area   FLUTICASONE (FLONASE) 50 MCG/ACT NASAL SPRAY    Place 2 sprays into both nostrils daily as needed.   IMIPRAMINE (TOFRANIL) 50 MG TABLET    Take 1 tablet (50 mg total) by mouth 2 (two) times daily.   MULTIPLE VITAMINS-MINERALS (MULTIVITAMIN ADULT PO)    Take 1 tablet by mouth daily.   NAPROXEN (NAPROSYN) 500 MG TABLET    Take 1 tablet (500 mg total) by mouth 2 (two) times daily with a meal.   SERTRALINE (ZOLOFT) 50 MG TABLET    Take 1.5 tablets (75 mg total) by mouth daily.    Review of Systems  Constitutional: Negative for fever, chills, diaphoresis, appetite change and fatigue.  Respiratory: Negative for chest tightness and shortness of breath.   Cardiovascular: Negative for chest pain and palpitations.    Gastrointestinal: Negative for nausea, vomiting and abdominal pain.  Musculoskeletal: Positive for myalgias (left leg) and arthralgias (knee pain).  Neurological: Negative for dizziness and weakness.    Social History  Substance Use Topics  . Smoking status: Never Smoker   . Smokeless tobacco: Not on file  . Alcohol Use: 0.0 oz/week    0 Standard drinks or equivalent per week     Comment: occasional use   Objective:   BP 122/72 mmHg  Pulse 125  Temp(Src) 97.6 F (36.4 C) (Oral)  Resp 20  Wt 230 lb (104.327 kg)  SpO2 97%  Physical Exam  General appearance: alert, well developed, well nourished, cooperative and in no distress Head: Normocephalic, without obvious abnormality, atraumatic Lungs: Respirations even and unlabored Extremities: No gross deformities Skin: Skin color, texture, turgor normal. No rashes seen  Psych: Appropriate mood and affect. Neurologic: Mental status: Alert, oriented to person, place, and time, thought content appropriate. MS: FROM of knee. Mild joint line tenderness. Tender at origin of gastrocnemius. Slightly swelling. No cords. Negative Homan's sign. No calf tenderness.     Assessment & Plan:     1. Posterior knee pain, right New since evaluation of 02/15/2015 and suggestive  of soft tissue injury. She is to continue naprosyn and knee brace and apply ice Q4 hours when able. She is to call if not much improved over the weekend.        Lelon Huh, MD  [facility]

## 2015-03-14 ENCOUNTER — Telehealth: Payer: Self-pay | Admitting: Family Medicine

## 2015-03-14 DIAGNOSIS — M25569 Pain in unspecified knee: Secondary | ICD-10-CM

## 2015-03-14 NOTE — Telephone Encounter (Signed)
Left message to call back  

## 2015-03-14 NOTE — Telephone Encounter (Signed)
She needs referral to orthopedist. Please advise and forward to sarah for referral.

## 2015-03-14 NOTE — Telephone Encounter (Signed)
Pt states she is still having pain left knee and is having trouble walking.  Pt is asking if she needs to come back in to see Dr Caryn Section or can she be referred to a specialist.  [phone]

## 2015-03-14 NOTE — Telephone Encounter (Signed)
Advised patient as below. Please refer patient to ortho. Im not sure if order was placed or not. Be more than happy to put order in just let me know. Thanks!

## 2015-03-23 DIAGNOSIS — M1610 Unilateral primary osteoarthritis, unspecified hip: Secondary | ICD-10-CM | POA: Insufficient documentation

## 2015-04-05 ENCOUNTER — Other Ambulatory Visit: Payer: Self-pay | Admitting: Family Medicine

## 2015-04-05 DIAGNOSIS — F410 Panic disorder [episodic paroxysmal anxiety] without agoraphobia: Secondary | ICD-10-CM

## 2015-04-05 MED ORDER — ALPRAZOLAM 0.5 MG PO TABS: 0.5000 mg | ORAL_TABLET | ORAL | Status: DC | PRN

## 2015-04-05 NOTE — Telephone Encounter (Signed)
Pt needs new refill on her xanax.  She will be traveling and it causes anxiety.  She uses [facility].  Her call back is [phone]

## 2015-04-05 NOTE — Telephone Encounter (Signed)
Please call in alprazolam.  

## 2015-04-06 NOTE — Telephone Encounter (Signed)
Rx called in to pharmacy. 

## 2015-04-27 ENCOUNTER — Other Ambulatory Visit: Payer: Self-pay | Admitting: Family Medicine

## 2015-04-28 ENCOUNTER — Other Ambulatory Visit: Payer: Self-pay

## 2015-05-09 ENCOUNTER — Encounter: Payer: Self-pay | Admitting: Family Medicine

## 2015-05-09 ENCOUNTER — Ambulatory Visit (INDEPENDENT_AMBULATORY_CARE_PROVIDER_SITE_OTHER): Payer: 59 | Admitting: Family Medicine

## 2015-05-09 VITALS — BP 120/70 | HR 122 | Temp 97.6°F | Resp 18 | Wt 229.0 lb

## 2015-05-09 DIAGNOSIS — Z6841 Body Mass Index (BMI) 40.0 and over, adult: Secondary | ICD-10-CM

## 2015-05-09 DIAGNOSIS — I10 Essential (primary) hypertension: Secondary | ICD-10-CM

## 2015-05-09 DIAGNOSIS — L608 Other nail disorders: Secondary | ICD-10-CM

## 2015-05-09 DIAGNOSIS — F410 Panic disorder [episodic paroxysmal anxiety] without agoraphobia: Secondary | ICD-10-CM | POA: Diagnosis not present

## 2015-05-09 DIAGNOSIS — R7303 Prediabetes: Secondary | ICD-10-CM

## 2015-05-09 LAB — POCT GLYCOSYLATED HEMOGLOBIN (HGB A1C)
Est. average glucose Bld gHb Est-mCnc: 128
HEMOGLOBIN A1C: 6.1

## 2015-05-09 MED ORDER — METFORMIN HCL ER 500 MG PO TB24: 500.0000 mg | ORAL_TABLET | Freq: Every day | ORAL | Status: DC

## 2015-05-09 NOTE — Addendum Note (Signed)
Addended by: Birdie Sons on: 05/09/2015 05:19 PM   Modules accepted: Orders

## 2015-05-09 NOTE — Progress Notes (Addendum)
Patient: Brittany Benton Female    DOB: 11-18-1955   60 y.o.   MRN: [id number] Visit Date: 05/09/2015  Today's Provider: Lelon Huh, MD   Chief Complaint  Patient presents with  . Panic Attack    follow up  . Hyperglycemia    follow up  . Nail Problem   Subjective:    HPI   Follow up Panic disorder:  Last office visit was 5 months ago and no changes were made. Would consider weaning Sertraline at next follow up after patient retires. Since last visit patient states she has retired 03/2015. She reports that she is very happy and has not had anymore recurrences of panic attacks.     Hyperglycemia, Follow-up:   Lab Results  Component Value Date   HGBA1C 6.2* 09/29/2014   HGBA1C 5.8 01/20/2014    Last seen for for this 7 months ago.  Management since then includes advising patient to work on diet. Would recheck AIC in 6 months. Current symptoms include none and have been stable.  Weight trend: stable Prior visit with dietician: no Current diet: in general, an "unhealthy" diet Current exercise: walking  Pertinent Labs:    Component Value Date/Time   CHOL 163 01/20/2014   TRIG 110 01/20/2014   CREATININE 1.1 01/20/2014    Wt Readings from Last 3 Encounters:  02/17/15 230 lb (104.327 kg)  02/15/15 231 lb (104.781 kg)  11/29/14 230 lb (104.327 kg)     Skin discoloration:  Patient comes in stating she has found a black spot underneath the thumb nail on her right hand. Patient first noticed the spot 1 month ago. Patient reports the spot seems to be moving up her nail bed.   Allergies  Allergen Reactions  . Codeine   . Morphine    Previous Medications   ALPRAZOLAM (XANAX) 0.5 MG TABLET    Take 1 tablet (0.5 mg total) by mouth every 4 (four) hours as needed for anxiety.   AMLODIPINE (NORVASC) 5 MG TABLET    Take 1 tablet (5 mg total) by mouth daily.   ASPIRIN 81 MG TABLET    Take 1 tablet by mouth daily.   ATORVASTATIN (LIPITOR) 10 MG TABLET    TAKE 1  TABLET EVERY DAY USUALLY IN THE EVENING   CLOTRIMAZOLE-BETAMETHASONE (LOTRISONE) CREAM    Apply 1 application topically 2 (two) times daily as needed. to affected area   FLUTICASONE (FLONASE) 50 MCG/ACT NASAL SPRAY    Place 2 sprays into both nostrils daily as needed.   IMIPRAMINE (TOFRANIL) 50 MG TABLET    Take 1 tablet (50 mg total) by mouth 2 (two) times daily.   MELOXICAM (MOBIC) 15 MG TABLET    Take 15 mg by mouth daily. For inflammatory pain   MULTIPLE VITAMINS-MINERALS (MULTIVITAMIN ADULT PO)    Take 1 tablet by mouth daily.   SERTRALINE (ZOLOFT) 50 MG TABLET    Take 1.5 tablets (75 mg total) by mouth daily.    Review of Systems  Constitutional: Negative for fever, chills, appetite change and fatigue.  Respiratory: Negative for chest tightness and shortness of breath.   Cardiovascular: Negative for chest pain and palpitations.  Gastrointestinal: Negative for nausea, vomiting and abdominal pain.  Endocrine: Negative for cold intolerance, heat intolerance, polydipsia, polyphagia and polyuria.  Skin: Positive for color change.  Neurological: Negative for dizziness and weakness.  Psychiatric/Behavioral: Negative for suicidal ideas, behavioral problems, confusion, sleep disturbance, self-injury, dysphoric mood, decreased concentration and agitation.  The patient is not nervous/anxious.     Social History  Substance Use Topics  . Smoking status: Never Smoker   . Smokeless tobacco: Not on file  . Alcohol Use: 0.0 oz/week    0 Standard drinks or equivalent per week     Comment: occasional   Objective:   BP 120/70 mmHg  Pulse 122  Temp(Src) 97.6 F (36.4 C) (Oral)  Resp 18  Wt 229 lb (103.874 kg)  SpO2 99%  Physical Exam  General Appearance:    Alert, cooperative, no distress, morbidly obese  Eyes:    PERRL, conjunctiva/corneas clear, EOM's intact       Lungs:     Clear to auscultation bilaterally, respirations unlabored  Heart:    Regular rate and rhythm  Neurologic:   Awake,  alert, oriented x 3. No apparent focal neurological           defect.   Integ:   About 61mm black dot with trail not cuticle seen through right thumbnail.     Results for orders placed or performed in visit on 05/09/15  POCT HgB A1C  Result Value Ref Range   Hemoglobin A1C 6.1    Est. average glucose Bld gHb Est-mCnc 128        Assessment & Plan:     1. Prediabetes  - POCT HgB A1C - metFORMIN (GLUCOPHAGE XR) 500 MG 24 hr tablet; Take 1 tablet (500 mg total) by mouth daily with breakfast.  Dispense: 30 tablet; Refill: 5  2. Essential (primary) hypertension Well controlled.  Continue current medications.    3. Panic disorder Well controlled since retiring. Continue SSRI for now. Consider reducing sertraline dose if still doing well at follow up.   4. BMI 40.0-44.9, adult [facility]) She inquired about bariatric surgery which she would be a candidate. For. Discussed risks and benefits and advised she could call for referral if she would like.   5. Nail discoloration Refer dermatology  Follow up 4 months.         Lelon Huh, MD  [facility]

## 2015-06-08 DIAGNOSIS — E669 Obesity, unspecified: Secondary | ICD-10-CM | POA: Insufficient documentation

## 2015-06-08 DIAGNOSIS — K219 Gastro-esophageal reflux disease without esophagitis: Secondary | ICD-10-CM | POA: Insufficient documentation

## 2015-06-08 DIAGNOSIS — G4733 Obstructive sleep apnea (adult) (pediatric): Secondary | ICD-10-CM | POA: Insufficient documentation

## 2015-06-08 DIAGNOSIS — Z9989 Dependence on other enabling machines and devices: Secondary | ICD-10-CM | POA: Insufficient documentation

## 2015-06-08 DIAGNOSIS — K432 Incisional hernia without obstruction or gangrene: Secondary | ICD-10-CM | POA: Insufficient documentation

## 2015-06-08 DIAGNOSIS — R5381 Other malaise: Secondary | ICD-10-CM | POA: Insufficient documentation

## 2015-06-15 ENCOUNTER — Other Ambulatory Visit: Payer: Self-pay | Admitting: Bariatrics

## 2015-06-17 ENCOUNTER — Other Ambulatory Visit: Payer: Self-pay | Admitting: Family Medicine

## 2015-06-23 ENCOUNTER — Other Ambulatory Visit: Payer: Self-pay | Admitting: Bariatrics

## 2015-06-23 ENCOUNTER — Ambulatory Visit
Admission: RE | Admit: 2015-06-23 | Discharge: 2015-06-23 | Disposition: A | Discharge status: Home / Self Care | Payer: 59 | Source: Ambulatory Visit | Attending: Bariatrics | Admitting: Bariatrics

## 2015-06-23 DIAGNOSIS — K219 Gastro-esophageal reflux disease without esophagitis: Secondary | ICD-10-CM | POA: Diagnosis not present

## 2015-06-23 DIAGNOSIS — Z9049 Acquired absence of other specified parts of digestive tract: Secondary | ICD-10-CM | POA: Insufficient documentation

## 2015-07-04 ENCOUNTER — Other Ambulatory Visit: Payer: Self-pay | Admitting: Family Medicine

## 2015-08-01 ENCOUNTER — Ambulatory Visit (INDEPENDENT_AMBULATORY_CARE_PROVIDER_SITE_OTHER)
Admission: RE | Admit: 2015-08-01 | Discharge: 2015-08-01 | Disposition: A | Discharge status: Home / Self Care | Payer: Self-pay | Source: Ambulatory Visit | Attending: Cardiovascular Disease | Admitting: Cardiovascular Disease

## 2015-08-01 ENCOUNTER — Encounter: Payer: Self-pay | Admitting: Cardiovascular Disease

## 2015-08-01 ENCOUNTER — Ambulatory Visit (INDEPENDENT_AMBULATORY_CARE_PROVIDER_SITE_OTHER): Payer: 59 | Admitting: Cardiovascular Disease

## 2015-08-01 ENCOUNTER — Encounter (INDEPENDENT_AMBULATORY_CARE_PROVIDER_SITE_OTHER): Payer: Self-pay

## 2015-08-01 VITALS — BP 118/80 | HR 110 | Ht 60.0 in | Wt 215.1 lb

## 2015-08-01 DIAGNOSIS — Z7689 Persons encountering health services in other specified circumstances: Secondary | ICD-10-CM

## 2015-08-01 DIAGNOSIS — Z7189 Other specified counseling: Secondary | ICD-10-CM

## 2015-08-01 DIAGNOSIS — E785 Hyperlipidemia, unspecified: Secondary | ICD-10-CM

## 2015-08-01 DIAGNOSIS — R9431 Abnormal electrocardiogram [ECG] [EKG]: Secondary | ICD-10-CM

## 2015-08-01 DIAGNOSIS — Z01810 Encounter for preprocedural cardiovascular examination: Secondary | ICD-10-CM

## 2015-08-01 DIAGNOSIS — R000 Tachycardia, unspecified: Secondary | ICD-10-CM | POA: Diagnosis not present

## 2015-08-01 DIAGNOSIS — I10 Essential (primary) hypertension: Secondary | ICD-10-CM

## 2015-08-01 DIAGNOSIS — R7303 Prediabetes: Secondary | ICD-10-CM

## 2015-08-01 MED ORDER — METOPROLOL SUCCINATE ER 50 MG PO TB24: 50.0000 mg | ORAL_TABLET | Freq: Every day | ORAL | Status: DC

## 2015-08-01 NOTE — Assessment & Plan Note (Signed)
CT coronary calcium score ordered in the next day or so. If the score comes back very low, she would be except will risk for upcoming gastric surgery. No other testing needed. No strong indication for stress testing, will proceed with calcium score scan as above.

## 2015-08-01 NOTE — Progress Notes (Signed)
Patient ID: Brittany Benton, female    DOB: 1955-06-14, 60 y.o.   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 She was referred for consultation by Dr. Arletta Bale.  In general she reports that she is doing well, denies any significant shortness of breath or chest pain on exertion. No prior cardiac history. She denies any strong family history of coronary disease. She's been on cholesterol medication for approximately 20 years. Reports that her sugars are typically well controlled. Most recent hemoglobin A1c 6.1. Previously had problems with panic attacks, this has not been a issue recently. Sees Dr. Caryn Section for this.  She has indicated that she would like to have hip surgery though she cannot have hip surgery until she loses weight per the patient. Given this, she would like to have bariatric surgery followed by hip surgery at a later date. She is hoping for minimally invasive laparoscopic procedure, possibly sleeve procedure to be done by Dr. Duke Salvia. This has not been scheduled yet.  She reports that she was has a limited heart rate. This seems to run in her family Review of prior office note records typically shows heart rate greater than 100. She takes amlodipine for blood pressure control.  EKG on today's visit shows sinus tachycardia with rate 10 7 bpm, no significant ST or T-wave changes   Allergies  Allergen Reactions  . Codeine   . Morphine     Current Outpatient Prescriptions on File Prior to Visit  Medication Sig Dispense Refill  . ALPRAZolam (XANAX) 0.5 MG tablet Take 1 tablet (0.5 mg total) by mouth every 4 (four) hours as needed for anxiety. 15 tablet 0  . aspirin 81 MG tablet Take 1 tablet by mouth daily.    Marland Kitchen atorvastatin (LIPITOR) 10 MG tablet TAKE  1 TABLET EVERY DAY USUALLY IN THE EVENING 90 tablet 4  . fluticasone (FLONASE) 50 MCG/ACT nasal spray Place 2 sprays into both nostrils daily as needed.    Marland Kitchen imipramine (TOFRANIL) 50 MG tablet TAKE ONE TABLET TWICE DAILY 60 tablet 6  . meloxicam (MOBIC) 15 MG tablet Take 15 mg by mouth daily. For inflammatory pain    . metFORMIN (GLUCOPHAGE XR) 500 MG 24 hr tablet Take 1 tablet (500 mg total) by mouth daily with breakfast. 30 tablet 5  . Multiple Vitamins-Minerals (MULTIVITAMIN ADULT PO) Take 1 tablet by mouth daily.    . sertraline (ZOLOFT) 50 MG tablet TAKE 1 AND 1/2 TABLETS EVERY DAY 45 tablet 12   No current facility-administered medications on file prior to visit.    Past Medical History  Diagnosis Date  . Renal cell carcinoma ([place])     s/p right nephrectomy 1994  . History of chicken pox     Past Surgical History  Procedure Laterality Date  . Appendectomy  1994  . Cholecystectomy  1994  . Nephrectomy  1994    Renal Cell Carcinoma  . Parotid gland removal  1990    also removed a tumor  . Breast biopsy Right 1991    Negative    Social History  reports that she has never smoked. She does not have any smokeless tobacco history on file. She reports that she drinks alcohol. She reports that she does not use illicit drugs.  Family History family history includes Breast cancer in her  maternal aunt; Dementia in her mother; Heart disease in her father; Osteoporosis in her mother.  Review of Systems  Constitutional: Negative.   Eyes: Negative.   Respiratory: Negative.   Cardiovascular: Negative.   Gastrointestinal: Negative.   Endocrine: Negative.   Musculoskeletal: Positive for arthralgias.  Allergic/Immunologic: Negative.   Neurological: Negative.   Hematological: Negative.   Psychiatric/Behavioral: Negative.   All other systems reviewed and are negative.   BP 118/80 mmHg  Pulse 110  Ht 5' (1.524 m)  Wt 215 lb 1.9 oz (97.578 kg)  BMI 42.01 kg/m2  SpO2  96%   Physical Exam  Constitutional: She is oriented to person, place, and time. She appears well-developed and well-nourished.  Obese  HENT:  Head: Normocephalic.  Nose: Nose normal.  Mouth/Throat: Oropharynx is clear and moist.  Eyes: Conjunctivae are normal. Pupils are equal, round, and reactive to light.  Neck: Normal range of motion. Neck supple. No JVD present.  Cardiovascular: Normal rate, regular rhythm, normal heart sounds and intact distal pulses.  Exam reveals no gallop and no friction rub.   No murmur heard. Pulmonary/Chest: Effort normal and breath sounds normal. No respiratory distress. She has no wheezes. She has no rales. She exhibits no tenderness.  Abdominal: Soft. Bowel sounds are normal. She exhibits no distension. There is no tenderness.  Musculoskeletal: Normal range of motion. She exhibits no edema or tenderness.  Lymphadenopathy:    She has no cervical adenopathy.  Neurological: She is alert and oriented to person, place, and time. Coordination normal.  Skin: Skin is warm and dry. No rash noted. No erythema.  Psychiatric: She has a normal mood and affect. Her behavior is normal. Judgment and thought content normal.

## 2015-08-01 NOTE — Assessment & Plan Note (Signed)
Blood pressure well controlled, heart rate elevated Recommended she stop the amlodipine, start metoprolol succinate 25 mg daily If no dramatic improvement in her heart rate, would increase up to 50 mg daily

## 2015-08-01 NOTE — Assessment & Plan Note (Signed)
Heart rate above 100 on a regular basis. Recommended we change amlodipine to a beta blocker as above for heart rate control.

## 2015-08-01 NOTE — Assessment & Plan Note (Signed)
Cholesterol is at goal on the current lipid regimen. No changes to the medications were made.  

## 2015-08-01 NOTE — Assessment & Plan Note (Signed)
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 This is likely a reading error, overall looks relatively normal. To confirm this, CT coronary calcium score has been ordered. If score is low, this would place her at very low risk with upcoming procedure.

## 2015-08-01 NOTE — Patient Instructions (Addendum)
You are doing well.  Stop the amlodipine  Start metoprolol 1/2 pill in the morning If your heart rate continue to run fast (>95 beats per minute), Then increase the metoprolol up to a full pill  We will schedule a CT coronary calcium score for abnormal EKG There is a one-time fee of $150 due at the time of your procedure  Today @ 2:00, please arrive @ 1:45  [address] [place] [phone]  Please call us if you have new issues that need to be addressed before your next appt.    Coronary Calcium Scan A coronary calcium scan is an imaging test used to look for deposits of calcium and other fatty materials (plaques) in the inner lining of the blood vessels of your heart (coronary arteries). These deposits of calcium and plaques can partly clog and narrow the coronary arteries without producing any symptoms or warning signs. This puts you at risk for a heart attack. This test can detect these deposits before symptoms develop.  LET [facility] CARE PROVIDER KNOW ABOUT:  Any allergies you have.  All medicines you are taking, including vitamins, herbs, eye drops, creams, and over-the-counter medicines.  Previous problems you or members of your family have had with the use of anesthetics.  Any blood disorders you have.  Previous surgeries you have had.  Medical conditions you have.  Possibility of pregnancy, if this applies. RISKS AND COMPLICATIONS Generally, this is a safe procedure. However, as with any procedure, complications can occur. This test involves the use of radiation. Radiation exposure can be dangerous to a pregnant woman and her unborn baby. If you are pregnant, you should not have this procedure done.  BEFORE THE PROCEDURE There is no special preparation for the procedure. PROCEDURE  You will need to undress and put on a hospital gown. You will need to remove any jewelry around your neck or chest.  Sticky electrodes are placed on your chest  and are connected to an electrocardiogram (EKG or electrocardiography) machine to recorda tracing of the electrical activity of your heart.  A CT scanner will take pictures of your heart. During this time, you will be asked to lie still and hold your breath for 2-3 seconds while a picture is being taken of your heart. AFTER THE PROCEDURE   You will be allowed to get dressed.  You can return to your normal activities after the scan is done.   This information is not intended to replace advice given to you by your health care provider. Make sure you discuss any questions you have with your health care provider.   Document Released: 09/29/2007 Document Revised: 04/07/2013 Document Reviewed: 12/08/2012 Elsevier Interactive Patient Education [employer].

## 2015-08-01 NOTE — Assessment & Plan Note (Signed)
We have encouraged continued exercise, careful diet management in an effort to lose weight. 

## 2015-08-03 ENCOUNTER — Telehealth: Payer: Self-pay | Admitting: Cardiovascular Disease

## 2015-08-03 ENCOUNTER — Encounter: Payer: Self-pay | Admitting: *Deleted

## 2015-08-03 NOTE — Telephone Encounter (Signed)
Pt returning a call  States it may be about her CT test she did Please call back

## 2015-08-03 NOTE — Telephone Encounter (Signed)
CT calcium score of 0, excellent news Can we send a preop letter to her surgeon doing gastric bypass surgery Acceptable risk, no further testing needed

## 2015-08-03 NOTE — Telephone Encounter (Signed)
Letter completed and forwarded to Dr. Duke Salvia in [place].

## 2015-08-03 NOTE — Telephone Encounter (Signed)
Patient returning call regarding CT results. Reviewed results with her and she then wanted to know if Dr. Rockey Situ would be releasing her for upcoming bariatric surgery. Let her know that I would forward to him for that release and she had no further questions at this time.

## 2015-08-08 ENCOUNTER — Telehealth: Payer: Self-pay | Admitting: Family Medicine

## 2015-08-08 NOTE — Telephone Encounter (Signed)
Patient is having bariatric surgery and needs to have proof that she uses a cpap machine at home. Patient was told that we could get data readings from her cpap machine. Advised patient that she needs to contact the company where she gets her cpap machine and supplies.

## 2015-08-08 NOTE — Telephone Encounter (Signed)
Pt needs you to call her back regarding the usage of her C-pap machine, and reordering parts.  Please call her at [phone]  Thanks, Con Memos

## 2015-08-17 ENCOUNTER — Other Ambulatory Visit: Payer: Self-pay | Admitting: Family Medicine

## 2015-11-02 ENCOUNTER — Other Ambulatory Visit: Payer: Self-pay | Admitting: Family Medicine

## 2015-11-23 ENCOUNTER — Other Ambulatory Visit: Payer: Self-pay | Admitting: Family Medicine

## 2016-01-04 ENCOUNTER — Telehealth: Payer: Self-pay | Admitting: Family Medicine

## 2016-01-04 NOTE — Telephone Encounter (Signed)
Pt called saying she needs a written note saying when she needs to stop the baby Asprin that she is taking and when she needs to start taking it again. She is having bariatric surgery possible as soon as next week.  She will come pick up the note when it is ready.  Her call back is [phone]  Thanks Con Memos

## 2016-01-05 NOTE — Telephone Encounter (Signed)
I will be out of office until 01-14-2016 so I an unable to write this note. Ok to take last dose of aspirin 6 days before surgery

## 2016-01-06 NOTE — Telephone Encounter (Signed)
Patient was notified. Patient stated that she found out yesterday it will be as late as 02/02/2016 before she has the bariatric surgery. Patient stated that the surgeon  Dr. Duke Salvia told her she had to have a letter with written instructions. Patient said it is okay with her to wait until after you return.

## 2016-01-19 ENCOUNTER — Other Ambulatory Visit: Payer: Self-pay | Admitting: Family Medicine

## 2016-02-02 HISTORY — PX: LAPAROSCOPIC GASTRIC SLEEVE RESECTION: SHX5895

## 2016-03-23 ENCOUNTER — Other Ambulatory Visit: Payer: Self-pay | Admitting: Family Medicine

## 2016-03-23 DIAGNOSIS — Z1231 Encounter for screening mammogram for malignant neoplasm of breast: Secondary | ICD-10-CM

## 2016-03-26 ENCOUNTER — Other Ambulatory Visit: Payer: Self-pay | Admitting: Family Medicine

## 2016-03-26 ENCOUNTER — Encounter: Payer: Self-pay | Admitting: Family Medicine

## 2016-03-26 ENCOUNTER — Ambulatory Visit (INDEPENDENT_AMBULATORY_CARE_PROVIDER_SITE_OTHER): Payer: 59 | Admitting: Family Medicine

## 2016-03-26 ENCOUNTER — Ambulatory Visit
Admission: RE | Admit: 2016-03-26 | Discharge: 2016-03-26 | Disposition: A | Discharge status: Home / Self Care | Payer: 59 | Source: Ambulatory Visit | Attending: Family Medicine | Admitting: Family Medicine

## 2016-03-26 VITALS — BP 112/72 | HR 112 | Temp 97.8°F | Resp 16 | Wt 190.0 lb

## 2016-03-26 DIAGNOSIS — I10 Essential (primary) hypertension: Secondary | ICD-10-CM

## 2016-03-26 DIAGNOSIS — Z9884 Bariatric surgery status: Secondary | ICD-10-CM

## 2016-03-26 DIAGNOSIS — M1611 Unilateral primary osteoarthritis, right hip: Secondary | ICD-10-CM | POA: Diagnosis not present

## 2016-03-26 DIAGNOSIS — E785 Hyperlipidemia, unspecified: Secondary | ICD-10-CM | POA: Diagnosis not present

## 2016-03-26 DIAGNOSIS — Z1231 Encounter for screening mammogram for malignant neoplasm of breast: Secondary | ICD-10-CM

## 2016-03-26 DIAGNOSIS — R7303 Prediabetes: Secondary | ICD-10-CM | POA: Diagnosis not present

## 2016-03-26 LAB — POCT GLYCOSYLATED HEMOGLOBIN (HGB A1C)
Est. average glucose Bld gHb Est-mCnc: 100
Hemoglobin A1C: 5.1

## 2016-03-26 MED ORDER — TRAMADOL HCL 50 MG PO TABS: 50.0000 mg | ORAL_TABLET | Freq: Three times a day (TID) | ORAL | 2 refills | Status: DC | PRN

## 2016-03-26 NOTE — Progress Notes (Signed)
Patient: Brittany Benton Female    DOB: [birth year]   60 y.o.   MRN: [id number] Visit Date: 03/26/2016  Today's Provider: Lelon Huh, MD   Chief Complaint  Patient presents with  . Hypertension  . Hyperglycemia  . Hyperlipidemia   Subjective:    HPI  Prediabetes, Follow-up:   Lab Results  Component Value Date   HGBA1C 5.1 03/26/2016   HGBA1C 6.1 05/09/2015   HGBA1C 6.2 (H) 09/29/2014    Last seen for for this10 months ago.  Management since that visit includes starting on Metformin. Patient reports that she has been off of the Metformin since her bariatric surgery in October ( 2 months ago).  Current symptoms include none and have been stable.  Weight trend: decreasing steadily Prior visit with dietician: no Current diet: on average, 3 meals per day Current exercise: none  Pertinent Labs:    Component Value Date/Time   CHOL 163 01/20/2014   TRIG 110 01/20/2014   CREATININE 1.1 01/20/2014    Wt Readings from Last 3 Encounters:  03/26/16 190 lb (86.2 kg)  08/01/15 215 lb 1.9 oz (97.6 kg)  05/09/15 229 lb (103.9 kg)    Hypertension, follow-up:  BP Readings from Last 3 Encounters:  03/26/16 112/72  08/01/15 118/80  05/09/15 120/70    She was last seen for hypertension 10 months ago.  BP at that visit was 120/70. Management since that visit includes no changes. However, patient reports that she has seen the cardiologist and he started her on Metoprolol.  She reports good compliance with treatment. She is not having side effects.  She is not exercising. She is adherent to low salt diet.   Outside blood pressures are not being checked. She is experiencing fatigue.  Patient denies exertional chest pressure/discomfort and syncope.   Cardiovascular risk factors include dyslipidemia, obesity (BMI >= 30 kg/m2) and sedentary lifestyle.     Weight trend: decreasing steadily Wt Readings from Last 3 Encounters:  03/26/16 190 lb (86.2 kg)  08/01/15 215  lb 1.9 oz (97.6 kg)  05/09/15 229 lb (103.9 kg)    Current diet: on average, 3 meals per day   Has follow up Dr. Duke Salvia at the end of the month regarding bariatric surgery  She states that she has stopped tylenol and meloxicam since bariatric surgery, but is having more right hip pain. He was seen by Dr. Sabra Heck prior to bariatric surgery and is anticipating hip replacement next year, but is now mostly confined to chair due to severe pain in hip when walking.     Allergies  Allergen Reactions  . Codeine   . Morphine      Current Outpatient Prescriptions:  .  ALPRAZolam (XANAX) 0.5 MG tablet, Take 1 tablet (0.5 mg total) by mouth every 4 (four) hours as needed for anxiety., Disp: 15 tablet, Rfl: 0 .  fluticasone (FLONASE) 50 MCG/ACT nasal spray, Place 2 sprays into both nostrils daily as needed., Disp: , Rfl:  .  imipramine (TOFRANIL) 50 MG tablet, TAKE ONE TABLET TWICE DAILY, Disp: 180 tablet, Rfl: 1 .  metoprolol succinate (TOPROL-XL) 50 MG 24 hr tablet, Take 1 tablet (50 mg total) by mouth daily. Take with or immediately following a meal., Disp: 90 tablet, Rfl: 3 .  Multiple Vitamins-Minerals (MULTIVITAMIN ADULT PO), Take 1 tablet by mouth daily., Disp: , Rfl:  .  omeprazole (PRILOSEC) 20 MG capsule, Take 20 mg by mouth daily., Disp: , Rfl:  .  sertraline (  ZOLOFT) 50 MG tablet, TAKE ONE AND A HALF TABLETS BY MOUTH EVERY DAY, Disp: 135 tablet, Rfl: 0 .  aspirin 81 MG tablet, Take 1 tablet by mouth daily., Disp: , Rfl:  .  atorvastatin (LIPITOR) 10 MG tablet, TAKE 1 TABLET EVERY DAY USUALLY IN THE EVENING (Patient not taking: Reported on 03/26/2016), Disp: 90 tablet, Rfl: 4 .  meloxicam (MOBIC) 15 MG tablet, Take 15 mg by mouth daily. For inflammatory pain, Disp: , Rfl:  .  metFORMIN (GLUCOPHAGE-XR) 500 MG 24 hr tablet, Take 1 tablet (500 mg total) by mouth daily with breakfast. PATIENT NEEDS TO SCHEDULE OFFICE VISIT FOR FOLLOW UP (Patient not taking: Reported on 03/26/2016), Disp: 90  tablet, Rfl: 0 .  metFORMIN (GLUCOPHAGE-XR) 500 MG 24 hr tablet, TAKE ONE TABLET BY MOUTH EVERY DAY WITH BREAKFAST (Patient not taking: Reported on 03/26/2016), Disp: 90 tablet, Rfl: 4  Review of Systems  Constitutional: Positive for activity change and fatigue.  Respiratory: Negative.   Cardiovascular: Negative.   Genitourinary: Negative.   Musculoskeletal: Negative.   Neurological: Negative.     Social History  Substance Use Topics  . Smoking status: Never Smoker  . Smokeless tobacco: Not on file  . Alcohol use 0.0 oz/week     Comment: occasional   Objective:   BP 112/72 (BP Location: Left Arm, Patient Position: Sitting, Cuff Size: Large)   Pulse (!) 112   Temp 97.8 F (36.6 C)   Resp 16   Wt 190 lb (86.2 kg)   SpO2 97%   BMI 37.11 kg/m   Physical Exam  General Appearance:    Alert, cooperative, no distress, obese  Eyes:    PERRL, conjunctiva/corneas clear, EOM's intact       Lungs:     Clear to auscultation bilaterally, respirations unlabored  Heart:    Regular rate and rhythm  Neurologic:   Awake, alert, oriented x 3. No apparent focal neurological           defect.         Results for orders placed or performed in visit on 03/26/16  POCT glycosylated hemoglobin (Hb A1C)  Result Value Ref Range   Hemoglobin A1C 5.1    Est. average glucose Bld gHb Est-mCnc 100        Assessment & Plan:     1. Essential (primary) hypertension Was changed to metoprolol by cardiology prior to surgery. Continue current medications.  Consider weaning betablocker after a few months if she continues to lose weight.   2. Hyperlipidemia, unspecified hyperlipidemia type Off of statin since bariatric surgery.  - Lipid panel  3. Prediabetes A1c now normal off of metformin since bariatric surgery - POCT glycosylated hemoglobin (Hb A1C)  4. Osteoarthritis of right hip, unspecified osteoarthritis type Try tramadol 50mg  for pain until she gets back in with orthopedics.   5. Status  post bariatric surgery      The entirety of the information documented in the History of Present Illness, Review of Systems and Physical Exam were personally obtained by me. Portions of this information were initially documented by Wilburt Finlay, CMA and reviewed by me for thoroughness and accuracy.    Lelon Huh, MD  [facility]

## 2016-03-31 ENCOUNTER — Encounter: Payer: Self-pay | Admitting: Family Medicine

## 2016-03-31 LAB — LIPID PANEL
CHOL/HDL RATIO: 5.4 ratio — AB (ref 0.0–4.4)
Cholesterol, Total: 212 mg/dL — ABNORMAL HIGH (ref 100–199)
HDL: 39 mg/dL — ABNORMAL LOW (ref >39)
LDL Calculated: 141 mg/dL — ABNORMAL HIGH (ref 0–99)
Triglycerides: 159 mg/dL — ABNORMAL HIGH (ref 0–149)
VLDL CHOLESTEROL CAL: 32 mg/dL (ref 5–40)

## 2016-04-19 ENCOUNTER — Other Ambulatory Visit: Payer: Self-pay | Admitting: Family Medicine

## 2016-06-08 ENCOUNTER — Telehealth: Payer: Self-pay | Admitting: Cardiovascular Disease

## 2016-06-08 NOTE — Telephone Encounter (Signed)
Received cardiac clearance request for pt to proceed w/ Rt total hip surgery w/ Dr. Earnestine Leys under spinal anesthesia DOS has not been scheduled pending this clearance.  [facility] requests clearance and any notes & EKGs be sent to [phone].

## 2016-06-10 NOTE — Telephone Encounter (Signed)
Previous CT coronary calcium score of zero Would confirm no new sx since her last clinic visit Need to maker sure BP is acceptable for surgery If above acceptable with no new sx, She would be acceptable risk for surgery

## 2016-06-11 NOTE — Telephone Encounter (Signed)
Spoke w/ pt.  She reports that she had bariatric surgery at the end of 2017 and has lost ~ 60 lbs. She states that she is doing well, denies any chest pain, SOB or any other sx and that her BP has been "perfect". She reports that she is doing very well and is ready to get her hip pain taken care of.  Advised her that I am routing this clearance to Dr. Ammie Ferrier office and asked her to call back if we can be of further assistance.

## 2016-06-15 ENCOUNTER — Ambulatory Visit (INDEPENDENT_AMBULATORY_CARE_PROVIDER_SITE_OTHER): Payer: 59 | Admitting: Family Medicine

## 2016-06-15 ENCOUNTER — Telehealth: Payer: Self-pay | Admitting: Family Medicine

## 2016-06-15 VITALS — BP 128/76 | HR 88 | Temp 97.7°F | Resp 16 | Wt 168.0 lb

## 2016-06-15 DIAGNOSIS — Z01818 Encounter for other preprocedural examination: Secondary | ICD-10-CM

## 2016-06-15 DIAGNOSIS — I10 Essential (primary) hypertension: Secondary | ICD-10-CM

## 2016-06-15 NOTE — Progress Notes (Signed)
Patient: Brittany Benton Female    DOB: Jul 04, 1955   61 y.o.   MRN: [id number] Visit Date: 06/15/2016  Today's Provider: Lelon Huh, MD   Chief Complaint  Patient presents with  . Pre-op Exam   Subjective:    HPI  Patient presents today for a surgical clearance of her right hip in which she is having surgery in April. She is 4 1/2 months s/p gastric sleeve resection and recovering well. She has had no complications from prior surgeries or anesthesia. She has no chest pain, shortness or breath, dizziness, or palpitations. She has had cardiac clearance by Dr. Rockey Situ.     Allergies  Allergen Reactions  . Codeine   . Morphine   . Tramadol     hallucinations     Current Outpatient Prescriptions:  .  ALPRAZolam (XANAX) 0.5 MG tablet, Take 1 tablet (0.5 mg total) by mouth every 4 (four) hours as needed for anxiety., Disp: 15 tablet, Rfl: 0 .  fluticasone (FLONASE) 50 MCG/ACT nasal spray, Place 2 sprays into both nostrils daily as needed., Disp: , Rfl:  .  imipramine (TOFRANIL) 50 MG tablet, TAKE ONE TABLET TWICE DAILY, Disp: 180 tablet, Rfl: 1 .  metoprolol succinate (TOPROL-XL) 50 MG 24 hr tablet, Take 1 tablet (50 mg total) by mouth daily. Take with or immediately following a meal., Disp: 90 tablet, Rfl: 3 .  Multiple Vitamins-Minerals (MULTIVITAMIN ADULT PO), Take 1 tablet by mouth daily., Disp: , Rfl:  .  omeprazole (PRILOSEC) 20 MG capsule, Take 20 mg by mouth daily., Disp: , Rfl:  .  sertraline (ZOLOFT) 50 MG tablet, TAKE ONE AND A HALF TABLETS BY MOUTH EVERY DAY, Disp: 135 tablet, Rfl: 12 .  traMADol (ULTRAM) 50 MG tablet, Take 1 tablet (50 mg total) by mouth every 8 (eight) hours as needed. (Patient not taking: Reported on 06/15/2016), Disp: 30 tablet, Rfl: 2  Review of Systems  Constitutional: Negative.   Respiratory: Negative.   Cardiovascular: Negative.   Musculoskeletal: Positive for arthralgias, back pain and gait problem.  Psychiatric/Behavioral: Negative.      Past Surgical History:  Procedure Laterality Date  . APPENDECTOMY  1994  . BREAST BIOPSY Right 1991   Negative  . CHOLECYSTECTOMY  1994  . LAPAROSCOPIC GASTRIC SLEEVE RESECTION  02/02/2016   Dr Duke Salvia at [facility]  . NEPHRECTOMY  1994   Renal Cell Carcinoma  . parotid gland removal  1990   also removed a tumor   Patient Active Problem List   Diagnosis Date Noted  . Osteoarthritis of right hip 03/26/2016  . Status post bariatric surgery 03/26/2016  . Sinus tachycardia 08/01/2015  . Abnormal EKG 08/01/2015  . Left knee pain 02/15/2015  . Vitamin D deficiency 09/29/2014  . 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 Lump of right breast 09/22/2014  . Prediabetes 09/22/2014  . Pruritic dermatitis 09/22/2014  . Obstructive sleep apnea 09/22/2014  . Allergic rhinitis 09/15/2009  . Hypersomnia 09/14/2009  . Panic disorder 08/06/2008  . BMI 40.0-44.9, adult ([place]) 09/08/2007  . Essential (primary) hypertension 07/16/2007  . Hyperlipidemia 07/16/2007   Past Medical History:  Diagnosis Date  . History of chicken pox   . Renal cell carcinoma [facility])    s/p right nephrectomy 1994   Lab Results  Component Value Date   HGBA1C 5.1 03/26/2016    Social History  Substance Use Topics  . Smoking status: Never Smoker  . Smokeless tobacco: Not on file  . Alcohol  use 0.0 oz/week     Comment: occasional   Objective:   BP 128/76 (BP Location: Left Arm, Patient Position: Sitting, Cuff Size: Normal)   Pulse 88   Temp 97.7 F (36.5 C)   Resp 16   Wt 168 lb (76.2 kg)   SpO2 98%   BMI 32.81 kg/m  Vitals:   06/15/16 1356  BP: 128/76  Pulse: 88  Resp: 16  Temp: 97.7 F (36.5 C)  SpO2: 98%  Weight: 168 lb (76.2 kg)     Physical Exam   General Appearance:    Alert, cooperative, no distress  Eyes:    PERRL, conjunctiva/corneas clear, EOM's intact       Lungs:     Clear to auscultation bilaterally, respirations unlabored  Heart:    Regular rate and rhythm. No  murmurs, rubs, or gallops.   Neurologic:   Awake, alert, oriented x 3. No apparent focal neurological           defect.           Assessment & Plan:     1. Pre-op exam Patient has no absolute or relative contraindications to planned surgery and anesthesia including general anesthesia. She is at low risk for non-cardiac complications. Cardiac clearance deferred to Dr. Rockey Situ.       Lelon Huh, MD  [facility]

## 2016-06-15 NOTE — Telephone Encounter (Signed)
Please advise patient that orthopedist wants [place] to check blood tests, urinalysis and EKG before they will do surgery. Have printed lab order. She just needs to stop back to pick lab, get EKG, and provide urine samples.

## 2016-06-18 ENCOUNTER — Telehealth: Payer: Self-pay | Admitting: Family Medicine

## 2016-06-18 ENCOUNTER — Other Ambulatory Visit: Payer: Self-pay | Admitting: Family Medicine

## 2016-06-18 NOTE — Telephone Encounter (Signed)
LMTCB, both numbers.

## 2016-06-18 NOTE — Telephone Encounter (Signed)
Pt returning call.  CB#[phone]/MW

## 2016-06-19 NOTE — Telephone Encounter (Signed)
Left message to call back  

## 2016-06-19 NOTE — Telephone Encounter (Signed)
Refer to previous message 

## 2016-06-20 NOTE — Telephone Encounter (Signed)
Pt advised as below. Renaldo Fiddler, CMA

## 2016-06-22 LAB — HM DIABETES EYE EXAM

## 2016-06-25 ENCOUNTER — Other Ambulatory Visit (INDEPENDENT_AMBULATORY_CARE_PROVIDER_SITE_OTHER): Payer: 59 | Admitting: *Deleted

## 2016-06-25 DIAGNOSIS — Z01818 Encounter for other preprocedural examination: Secondary | ICD-10-CM

## 2016-06-25 LAB — POCT URINALYSIS DIPSTICK
Bilirubin, UA: NEGATIVE
GLUCOSE UA: NEGATIVE
Ketones, UA: NEGATIVE
NITRITE UA: NEGATIVE
PH UA: 6
PROTEIN UA: NEGATIVE
SPEC GRAV UA: 1.02
UROBILINOGEN UA: 0.2

## 2016-06-26 ENCOUNTER — Encounter: Payer: Self-pay | Admitting: *Deleted

## 2016-06-26 LAB — COMPREHENSIVE METABOLIC PANEL
A/G RATIO: 1.9 (ref 1.2–2.2)
ALBUMIN: 4.5 g/dL (ref 3.6–4.8)
ALT: 21 IU/L (ref 0–32)
AST: 23 IU/L (ref 0–40)
Alkaline Phosphatase: 103 IU/L (ref 39–117)
BILIRUBIN TOTAL: 0.5 mg/dL (ref 0.0–1.2)
BUN / CREAT RATIO: 12 (ref 12–28)
BUN: 10 mg/dL (ref 8–27)
CHLORIDE: 100 mmol/L (ref 96–106)
CO2: 25 mmol/L (ref 18–29)
Calcium: 9.9 mg/dL (ref 8.7–10.3)
Creatinine, Ser: 0.85 mg/dL (ref 0.57–1.00)
GFR calc non Af Amer: 74 mL/min/{1.73_m2} (ref >59)
GFR, EST AFRICAN AMERICAN: 85 mL/min/{1.73_m2} (ref >59)
GLUCOSE: 91 mg/dL (ref 65–99)
Globulin, Total: 2.4 g/dL (ref 1.5–4.5)
Potassium: 4.6 mmol/L (ref 3.5–5.2)
Sodium: 141 mmol/L (ref 134–144)
TOTAL PROTEIN: 6.9 g/dL (ref 6.0–8.5)

## 2016-06-26 LAB — CBC
HEMATOCRIT: 45.7 % (ref 34.0–46.6)
HEMOGLOBIN: 15 g/dL (ref 11.1–15.9)
MCH: 27 pg (ref 26.6–33.0)
MCHC: 32.8 g/dL (ref 31.5–35.7)
MCV: 82 fL (ref 79–97)
Platelets: 434 10*3/uL — ABNORMAL HIGH (ref 150–379)
RBC: 5.56 x10E6/uL — AB (ref 3.77–5.28)
RDW: 14.7 % (ref 12.3–15.4)
WBC: 8.8 10*3/uL (ref 3.4–10.8)

## 2016-06-26 LAB — HEMOGLOBIN A1C
Est. average glucose Bld gHb Est-mCnc: 100 mg/dL
Hgb A1c MFr Bld: 5.1 % (ref 4.8–5.6)

## 2016-07-02 ENCOUNTER — Encounter: Payer: Self-pay | Admitting: Cardiovascular Disease

## 2016-07-02 NOTE — Telephone Encounter (Signed)
This encounter was created in error - please disregard.

## 2016-07-02 NOTE — Telephone Encounter (Signed)
Received repeat cardiac clearance request. Routed this message to Dr. Ammie Ferrier office @ [phone].

## 2016-07-13 ENCOUNTER — Other Ambulatory Visit: Payer: Self-pay | Admitting: Cardiovascular Disease

## 2016-07-13 MED ORDER — METOPROLOL SUCCINATE ER 50 MG PO TB24: 50.0000 mg | ORAL_TABLET | Freq: Every day | ORAL | 0 refills | Status: DC

## 2016-07-19 ENCOUNTER — Telehealth: Payer: Self-pay | Admitting: Cardiovascular Disease

## 2016-07-19 NOTE — Telephone Encounter (Signed)
Lmov for patient to call back and schedule appointment with Dr Rockey Situ  Will try again at a later time

## 2016-07-19 NOTE — Telephone Encounter (Signed)
-----   Message from Kittie Plater, LPN sent at 05/22/154  5:16 PM EDT ----- Pt needs f/u appt with [facility]. Thanks

## 2016-07-22 ENCOUNTER — Other Ambulatory Visit: Payer: Self-pay | Admitting: Specialist

## 2016-07-25 ENCOUNTER — Encounter
Admission: RE | Admit: 2016-07-25 | Discharge: 2016-07-25 | Disposition: A | Discharge status: Home / Self Care | Payer: 59 | Source: Ambulatory Visit | Attending: Specialist | Admitting: Specialist

## 2016-07-25 DIAGNOSIS — Z01812 Encounter for preprocedural laboratory examination: Secondary | ICD-10-CM | POA: Diagnosis present

## 2016-07-25 DIAGNOSIS — Z0183 Encounter for blood typing: Secondary | ICD-10-CM | POA: Diagnosis not present

## 2016-07-25 HISTORY — DX: Unspecified osteoarthritis, unspecified site: M19.90

## 2016-07-25 HISTORY — DX: Sleep apnea, unspecified: G47.30

## 2016-07-25 HISTORY — DX: Gastro-esophageal reflux disease without esophagitis: K21.9

## 2016-07-25 HISTORY — DX: Essential (primary) hypertension: I10

## 2016-07-25 HISTORY — DX: Incisional hernia without obstruction or gangrene: K43.2

## 2016-07-25 HISTORY — DX: Anxiety disorder, unspecified: F41.9

## 2016-07-25 LAB — CBC
HEMATOCRIT: 43.2 % (ref 35.0–47.0)
HEMOGLOBIN: 14.3 g/dL (ref 12.0–16.0)
MCH: 27.5 pg (ref 26.0–34.0)
MCHC: 33 g/dL (ref 32.0–36.0)
MCV: 83.3 fL (ref 80.0–100.0)
Platelets: 297 10*3/uL (ref 150–440)
RBC: 5.19 MIL/uL (ref 3.80–5.20)
RDW: 15 % — ABNORMAL HIGH (ref 11.5–14.5)
WBC: 7.2 10*3/uL (ref 3.6–11.0)

## 2016-07-25 LAB — TYPE AND SCREEN
ABO/RH(D): A POS
Antibody Screen: NEGATIVE
EXTEND SAMPLE REASON: UNDETERMINED

## 2016-07-25 LAB — SURGICAL PCR SCREEN
MRSA, PCR: NEGATIVE
STAPHYLOCOCCUS AUREUS: POSITIVE — AB

## 2016-07-25 NOTE — Patient Instructions (Signed)
Your procedure is scheduled on: Wednesday August 08, 2016 Su procedimiento est programado para: Report to- come thru revolving door of medical mall to the second floor Presntese a: To find out your arrival time please call [phone] between 1PM - 3PM on Tuesday August 07, 2016 Para saber su hora de llegada por favor llame al ([place]:  Remember: Instructions that are not followed completely may result in serious medical risk, up to and including death, or upon the discretion of your surgeon and anesthesiologist your surgery may need to be rescheduled.  Recuerde: Las instrucciones que no se siguen completamente Heritage manager en un riesgo de salud grave, incluyendo hasta la [place] o a discrecin de su cirujano y Environmental health practitioner, su ciruga se puede posponer.   __X__ 1. Do not eat food or drink liquids after midnight. No gum chewing or hard candies.  No coma alimentos ni tome lquidos despus de la medianoche.  No mastique chicle ni caramelos  duros.     __X__ 2. No alcohol for 24 hours before or after surgery.    No tome alcohol durante las 24 horas antes ni despus de la [place].   ___X_ 3. Bring all medications with you on the day of surgery if instructed. BRING ANY NEW MEDICATION    Lleve todos los medicamentos con usted el da de su ciruga si se le ha indicado as.   __X__ 4. Notify your doctor if there is any change in your medical condition (cold, fever,                             infections).    Informe a su mdico si hay algn cambio en su condicin mdica (resfriado, fiebre, infecciones).   Do not wear jewelry, make-up, hairpins, clips or nail polish.  No use joyas, maquillajes, pinzas/ganchos para el cabello ni esmalte de uas.  Do not wear lotions, powders, or perfumes. You may NOT wear deodorant.  No use lociones, polvos o perfumes.  Puede usar desodorante.    Do not shave 48 hours prior to surgery. Men may shave face and neck.  No se  afeite 48 horas antes de la [place].  Los hombres pueden [employer] cara y el cuello.   Do not bring valuables to the hospital.   No lleve objetos [place] is not responsible for any belongings or valuables.  [place] no se hace responsable de ningn tipo de pertenencias u objetos de Geographical information systems officer.               Contacts, dentures or bridgework may not be worn into surgery.  Los lentes de [place], las dentaduras postizas o puentes no se pueden usar en la [place].  Leave your suitcase in the car. After surgery it may be brought to your room.  Deje su maleta en el auto.  Despus de la ciruga podr traerla a su habitacin.  For patients admitted to the hospital, discharge time is determined by your treatment team.  Para los pacientes que sean ingresados al hospital, el tiempo en el cual se le dar de alta es determinado por su                equipo de [place].   Patients discharged the day of surgery will not be allowed to drive home. A los pacientes que se les da de alta el mismo da de la  ciruga no se les permitir conducir a casa.   Please read over the following fact sheets that you were given: Por favor [place] informacin que le dieron:   MRSA INFO AND CHG SOAP    __X__ Take these medicines the morning of surgery with A SIP OF WATER:          [employer] medicinas la maana de la ciruga con UN SORBO DE AGUA:  1. METOPROLOL  2. OMEPRAZOLE DOSE NIGHT BEFORE AND MORNING   3. ZOLOFT  4. IMIPRAMINE      5.  6.  ____ Fleet Enema (as directed)          Enema de [place] (segn lo indicado)    ___X_ Use CHG Soap as directed          Utilice el jabn de CHG segn lo indicado  ____ Use inhalers on the day of surgery          Use los inhaladores el da de la ciruga  ____ Stop metformin 2 days prior to surgery          Deje de tomar el metformin 2 das antes de la ciruga    ____ Take 1/2 of usual insulin dose the night before surgery  and none on the morning of surgery           Tome la mitad de la dosis habitual de insulina la noche antes de la [place] y no tome nada en la maana de la             ciruga  ____ Stop Coumadin/Plavix/aspirin on           Deje de tomar el Coumadin/Plavix/aspirina el da:  ___X_ Stop Anti-inflammatories 7-10 DAYS BEFORE SURGERY           Deje de tomar antiinflamatorios el da:   ____ Stop supplements until after surgery            Deje de tomar suplementos hasta despus de la ciruga  _X___ Bring C-Pap to the hospital          [place] al hospital

## 2016-07-26 NOTE — Pre-Procedure Instructions (Signed)
POSITIVE STAPH FAXED TO DR H MILLER AND LM FOR Brittany Benton

## 2016-08-01 ENCOUNTER — Other Ambulatory Visit: Payer: Self-pay | Admitting: Family Medicine

## 2016-08-01 MED ORDER — METOPROLOL SUCCINATE ER 50 MG PO TB24: 50.0000 mg | ORAL_TABLET | Freq: Every day | ORAL | 4 refills | Status: DC

## 2016-08-01 NOTE — Telephone Encounter (Signed)
Pt contacted office for refill request on the following medications:  metoprolol succinate (TOPROL-XL) 50 MG 24 hr tablet.  [facility].  CB#[phone]/MW

## 2016-08-01 NOTE — Telephone Encounter (Signed)
Called patient, she is going to see if her PCP will take over on her care for medications, will call back if she needs [place]

## 2016-08-07 MED ORDER — CELECOXIB 200 MG PO CAPS
400.0000 mg | ORAL_CAPSULE | ORAL | Status: AC
Start: 2016-08-08 — End: 2016-08-08
  Administered 2016-08-08: 400 mg via ORAL

## 2016-08-07 MED ORDER — VANCOMYCIN HCL IN DEXTROSE 1-5 GM/200ML-% IV SOLN
1000.0000 mg | INTRAVENOUS | Status: AC
  Administered 2016-08-08: 1000 mg via INTRAVENOUS

## 2016-08-07 MED ORDER — PREGABALIN 75 MG PO CAPS
75.0000 mg | ORAL_CAPSULE | Freq: Two times a day (BID) | ORAL | Status: DC
  Administered 2016-08-08: 75 mg via ORAL

## 2016-08-08 ENCOUNTER — Inpatient Hospital Stay: Payer: 59 | Admitting: Certified Registered Nurse Anesthetist

## 2016-08-08 ENCOUNTER — Inpatient Hospital Stay
Admission: RE | Admit: 2016-08-08 | Discharge: 2016-08-11 | DRG: 470 | Disposition: A | Discharge status: Home Health | Payer: 59 | Source: Ambulatory Visit | Attending: Specialist | Admitting: Specialist

## 2016-08-08 ENCOUNTER — Inpatient Hospital Stay: Payer: 59

## 2016-08-08 ENCOUNTER — Encounter
Admission: RE | Disposition: A | Discharge status: Home Health | Payer: Self-pay | Source: Ambulatory Visit | Attending: Specialist

## 2016-08-08 DIAGNOSIS — D62 Acute posthemorrhagic anemia: Secondary | ICD-10-CM | POA: Diagnosis not present

## 2016-08-08 DIAGNOSIS — Z9049 Acquired absence of other specified parts of digestive tract: Secondary | ICD-10-CM | POA: Diagnosis not present

## 2016-08-08 DIAGNOSIS — F419 Anxiety disorder, unspecified: Secondary | ICD-10-CM | POA: Diagnosis present

## 2016-08-08 DIAGNOSIS — E860 Dehydration: Secondary | ICD-10-CM | POA: Diagnosis not present

## 2016-08-08 DIAGNOSIS — Z8249 Family history of ischemic heart disease and other diseases of the circulatory system: Secondary | ICD-10-CM | POA: Diagnosis not present

## 2016-08-08 DIAGNOSIS — Z888 Allergy status to other drugs, medicaments and biological substances status: Secondary | ICD-10-CM | POA: Diagnosis not present

## 2016-08-08 DIAGNOSIS — Z8262 Family history of osteoporosis: Secondary | ICD-10-CM | POA: Diagnosis not present

## 2016-08-08 DIAGNOSIS — Z885 Allergy status to narcotic agent status: Secondary | ICD-10-CM

## 2016-08-08 DIAGNOSIS — K219 Gastro-esophageal reflux disease without esophagitis: Secondary | ICD-10-CM | POA: Diagnosis present

## 2016-08-08 DIAGNOSIS — Z91048 Other nonmedicinal substance allergy status: Secondary | ICD-10-CM

## 2016-08-08 DIAGNOSIS — Z85528 Personal history of other malignant neoplasm of kidney: Secondary | ICD-10-CM

## 2016-08-08 DIAGNOSIS — I10 Essential (primary) hypertension: Secondary | ICD-10-CM | POA: Diagnosis present

## 2016-08-08 DIAGNOSIS — Z96649 Presence of unspecified artificial hip joint: Secondary | ICD-10-CM

## 2016-08-08 DIAGNOSIS — G629 Polyneuropathy, unspecified: Secondary | ICD-10-CM | POA: Diagnosis present

## 2016-08-08 DIAGNOSIS — Z803 Family history of malignant neoplasm of breast: Secondary | ICD-10-CM

## 2016-08-08 DIAGNOSIS — I959 Hypotension, unspecified: Secondary | ICD-10-CM | POA: Diagnosis not present

## 2016-08-08 DIAGNOSIS — G4733 Obstructive sleep apnea (adult) (pediatric): Secondary | ICD-10-CM | POA: Diagnosis present

## 2016-08-08 DIAGNOSIS — Z9884 Bariatric surgery status: Secondary | ICD-10-CM | POA: Diagnosis not present

## 2016-08-08 DIAGNOSIS — Z905 Acquired absence of kidney: Secondary | ICD-10-CM

## 2016-08-08 DIAGNOSIS — M1611 Unilateral primary osteoarthritis, right hip: Secondary | ICD-10-CM | POA: Diagnosis present

## 2016-08-08 DIAGNOSIS — F329 Major depressive disorder, single episode, unspecified: Secondary | ICD-10-CM | POA: Diagnosis present

## 2016-08-08 DIAGNOSIS — Z96641 Presence of right artificial hip joint: Secondary | ICD-10-CM

## 2016-08-08 HISTORY — PX: TOTAL HIP ARTHROPLASTY: SHX124

## 2016-08-08 LAB — CBC
HCT: 36.7 % (ref 35.0–47.0)
Hemoglobin: 12.3 g/dL (ref 12.0–16.0)
MCH: 27.8 pg (ref 26.0–34.0)
MCHC: 33.7 g/dL (ref 32.0–36.0)
MCV: 82.5 fL (ref 80.0–100.0)
PLATELETS: 225 10*3/uL (ref 150–440)
RBC: 4.44 MIL/uL (ref 3.80–5.20)
RDW: 14.3 % (ref 11.5–14.5)
WBC: 12.4 10*3/uL — ABNORMAL HIGH (ref 3.6–11.0)

## 2016-08-08 LAB — CREATININE, SERUM
CREATININE: 0.83 mg/dL (ref 0.44–1.00)
GFR calc Af Amer: >60 mL/min (ref >60)
GFR calc non Af Amer: >60 mL/min (ref >60)

## 2016-08-08 SURGERY — ARTHROPLASTY, HIP, TOTAL,POSTERIOR APPROACH
Anesthesia: Spinal | Site: Hip | Laterality: Right | Wound class: Clean

## 2016-08-08 MED ORDER — MENTHOL 3 MG MT LOZG
1.0000 | LOZENGE | OROMUCOSAL | Status: DC | PRN
  Filled 2016-08-08: qty 9

## 2016-08-08 MED ORDER — MAGNESIUM HYDROXIDE 400 MG/5ML PO SUSP: 30.0000 mL | Freq: Every day | ORAL | Status: DC | PRN

## 2016-08-08 MED ORDER — BUPIVACAINE-EPINEPHRINE (PF) 0.5% -1:200000 IJ SOLN
INTRAMUSCULAR | Status: AC
  Filled 2016-08-08: qty 30

## 2016-08-08 MED ORDER — ONDANSETRON HCL 4 MG PO TABS: 4.0000 mg | ORAL_TABLET | Freq: Four times a day (QID) | ORAL | Status: DC | PRN

## 2016-08-08 MED ORDER — BISACODYL 10 MG RE SUPP: 10.0000 mg | Freq: Every day | RECTAL | Status: DC | PRN

## 2016-08-08 MED ORDER — SODIUM CHLORIDE 0.9 % IJ SOLN
INTRAMUSCULAR | Status: AC
  Filled 2016-08-08: qty 50

## 2016-08-08 MED ORDER — IMIPRAMINE HCL 25 MG PO TABS
50.0000 mg | ORAL_TABLET | Freq: Two times a day (BID) | ORAL | Status: DC
  Administered 2016-08-08 – 2016-08-11 (×6): 50 mg via ORAL
  Filled 2016-08-08 (×6): qty 2

## 2016-08-08 MED ORDER — LIDOCAINE HCL (PF) 2 % IJ SOLN
INTRAMUSCULAR | Status: AC
  Filled 2016-08-08: qty 2

## 2016-08-08 MED ORDER — ENOXAPARIN SODIUM 30 MG/0.3ML ~~LOC~~ SOLN
30.0000 mg | Freq: Two times a day (BID) | SUBCUTANEOUS | Status: DC
  Administered 2016-08-09 – 2016-08-11 (×5): 30 mg via SUBCUTANEOUS
  Filled 2016-08-08 (×5): qty 0.3

## 2016-08-08 MED ORDER — PANTOPRAZOLE SODIUM 40 MG PO TBEC
40.0000 mg | DELAYED_RELEASE_TABLET | Freq: Every day | ORAL | Status: DC
  Administered 2016-08-09 – 2016-08-11 (×3): 40 mg via ORAL
  Filled 2016-08-08 (×3): qty 1

## 2016-08-08 MED ORDER — PHENOL 1.4 % MT LIQD
1.0000 | OROMUCOSAL | Status: DC | PRN
  Filled 2016-08-08: qty 177

## 2016-08-08 MED ORDER — BUPIVACAINE-EPINEPHRINE (PF) 0.25% -1:200000 IJ SOLN
INTRAMUSCULAR | Status: DC | PRN
  Administered 2016-08-08: 30 mL via PERINEURAL

## 2016-08-08 MED ORDER — METOCLOPRAMIDE HCL 5 MG/ML IJ SOLN: 5.0000 mg | Freq: Three times a day (TID) | INTRAMUSCULAR | Status: DC | PRN

## 2016-08-08 MED ORDER — CHLORHEXIDINE GLUCONATE CLOTH 2 % EX PADS: 6.0000 | MEDICATED_PAD | Freq: Once | CUTANEOUS | Status: DC

## 2016-08-08 MED ORDER — ALUM & MAG HYDROXIDE-SIMETH 200-200-20 MG/5ML PO SUSP: 30.0000 mL | ORAL | Status: DC | PRN

## 2016-08-08 MED ORDER — EPHEDRINE SULFATE 50 MG/ML IJ SOLN
INTRAMUSCULAR | Status: DC | PRN
  Administered 2016-08-08: 5 mg via INTRAVENOUS
  Administered 2016-08-08 (×2): 10 mg via INTRAVENOUS

## 2016-08-08 MED ORDER — CELECOXIB 200 MG PO CAPS
ORAL_CAPSULE | ORAL | Status: AC
  Administered 2016-08-08: 400 mg via ORAL
  Filled 2016-08-08: qty 2

## 2016-08-08 MED ORDER — METOCLOPRAMIDE HCL 10 MG PO TABS: 5.0000 mg | ORAL_TABLET | Freq: Three times a day (TID) | ORAL | Status: DC | PRN

## 2016-08-08 MED ORDER — PROPOFOL 500 MG/50ML IV EMUL
INTRAVENOUS | Status: AC
  Filled 2016-08-08: qty 50

## 2016-08-08 MED ORDER — FENTANYL CITRATE (PF) 100 MCG/2ML IJ SOLN
INTRAMUSCULAR | Status: DC | PRN
  Administered 2016-08-08 (×2): 50 ug via INTRAVENOUS

## 2016-08-08 MED ORDER — SENNA 8.6 MG PO TABS
1.0000 | ORAL_TABLET | Freq: Two times a day (BID) | ORAL | Status: DC
Start: 2016-08-08 — End: 2016-08-11
  Administered 2016-08-08 – 2016-08-11 (×6): 8.6 mg via ORAL
  Filled 2016-08-08 (×6): qty 1

## 2016-08-08 MED ORDER — HYDROMORPHONE HCL 1 MG/ML IJ SOLN: 1.0000 mg | INTRAMUSCULAR | Status: DC | PRN

## 2016-08-08 MED ORDER — ONDANSETRON HCL 4 MG/2ML IJ SOLN: 4.0000 mg | Freq: Four times a day (QID) | INTRAMUSCULAR | Status: DC | PRN

## 2016-08-08 MED ORDER — SODIUM CHLORIDE 0.9 % IV SOLN: 1000.0000 mg | INTRAVENOUS | Status: DC

## 2016-08-08 MED ORDER — ACETAMINOPHEN 10 MG/ML IV SOLN
INTRAVENOUS | Status: AC
  Filled 2016-08-08: qty 100

## 2016-08-08 MED ORDER — NEOMYCIN-POLYMYXIN B GU 40-200000 IR SOLN
Status: DC | PRN
  Administered 2016-08-08: 16 mL

## 2016-08-08 MED ORDER — BUPIVACAINE LIPOSOME 1.3 % IJ SUSP
INTRAMUSCULAR | Status: AC
  Filled 2016-08-08: qty 20

## 2016-08-08 MED ORDER — DEXAMETHASONE SODIUM PHOSPHATE 10 MG/ML IJ SOLN
INTRAMUSCULAR | Status: AC
  Filled 2016-08-08: qty 1

## 2016-08-08 MED ORDER — TETRACAINE HCL 1 % IJ SOLN
INTRAMUSCULAR | Status: DC | PRN
  Administered 2016-08-08: 10 mg via INTRASPINAL

## 2016-08-08 MED ORDER — TRANEXAMIC ACID 1000 MG/10ML IV SOLN
INTRAVENOUS | Status: AC
  Filled 2016-08-08: qty 10

## 2016-08-08 MED ORDER — SODIUM CHLORIDE 0.9 % IV SOLN
INTRAVENOUS | Status: DC | PRN
  Administered 2016-08-08: 10 ug/min via INTRAVENOUS

## 2016-08-08 MED ORDER — FENTANYL CITRATE (PF) 100 MCG/2ML IJ SOLN: 25.0000 ug | INTRAMUSCULAR | Status: DC | PRN

## 2016-08-08 MED ORDER — PROMETHAZINE HCL 25 MG/ML IJ SOLN: 6.2500 mg | INTRAMUSCULAR | Status: DC | PRN

## 2016-08-08 MED ORDER — VANCOMYCIN HCL IN DEXTROSE 1-5 GM/200ML-% IV SOLN
INTRAVENOUS | Status: AC
  Administered 2016-08-08: 1000 mg via INTRAVENOUS
  Filled 2016-08-08: qty 200

## 2016-08-08 MED ORDER — DIPHENHYDRAMINE HCL 12.5 MG/5ML PO ELIX
12.5000 mg | ORAL_SOLUTION | ORAL | Status: DC | PRN
  Administered 2016-08-09: 12.5 mg via ORAL
  Filled 2016-08-08: qty 10

## 2016-08-08 MED ORDER — ONDANSETRON HCL 4 MG/2ML IJ SOLN
INTRAMUSCULAR | Status: DC | PRN
  Administered 2016-08-08: 4 mg via INTRAVENOUS

## 2016-08-08 MED ORDER — ACETAMINOPHEN 500 MG PO TABS
1000.0000 mg | ORAL_TABLET | Freq: Four times a day (QID) | ORAL | Status: AC
  Administered 2016-08-08 – 2016-08-09 (×3): 1000 mg via ORAL
  Filled 2016-08-08 (×3): qty 2

## 2016-08-08 MED ORDER — SODIUM CHLORIDE 0.9 % IV SOLN
INTRAVENOUS | Status: DC | PRN
  Administered 2016-08-08: 60 mL

## 2016-08-08 MED ORDER — PREGABALIN 75 MG PO CAPS
75.0000 mg | ORAL_CAPSULE | Freq: Two times a day (BID) | ORAL | Status: DC
  Administered 2016-08-08 – 2016-08-11 (×5): 75 mg via ORAL
  Filled 2016-08-08 (×6): qty 1

## 2016-08-08 MED ORDER — BUPIVACAINE HCL (PF) 0.5 % IJ SOLN
INTRAMUSCULAR | Status: DC | PRN
  Administered 2016-08-08: 2 mL

## 2016-08-08 MED ORDER — ACETAMINOPHEN 650 MG RE SUPP
650.0000 mg | Freq: Four times a day (QID) | RECTAL | Status: DC | PRN
Start: 2016-08-08 — End: 2016-08-11

## 2016-08-08 MED ORDER — LIDOCAINE HCL (CARDIAC) 20 MG/ML IV SOLN
INTRAVENOUS | Status: DC | PRN
  Administered 2016-08-08: 40 mg via INTRAVENOUS

## 2016-08-08 MED ORDER — ACETAMINOPHEN 325 MG PO TABS
650.0000 mg | ORAL_TABLET | Freq: Four times a day (QID) | ORAL | Status: DC | PRN
  Administered 2016-08-10 – 2016-08-11 (×3): 650 mg via ORAL
  Filled 2016-08-08 (×3): qty 2

## 2016-08-08 MED ORDER — SERTRALINE HCL 50 MG PO TABS
50.0000 mg | ORAL_TABLET | Freq: Every day | ORAL | Status: DC
  Administered 2016-08-09 – 2016-08-11 (×3): 50 mg via ORAL
  Filled 2016-08-08 (×3): qty 1

## 2016-08-08 MED ORDER — HYDROCODONE-ACETAMINOPHEN 10-325 MG PO TABS
1.0000 | ORAL_TABLET | ORAL | Status: DC | PRN
  Administered 2016-08-09: 1 via ORAL
  Filled 2016-08-08: qty 1

## 2016-08-08 MED ORDER — FLEET ENEMA 7-19 GM/118ML RE ENEM: 1.0000 | ENEMA | Freq: Once | RECTAL | Status: DC | PRN

## 2016-08-08 MED ORDER — SODIUM CHLORIDE 0.45 % IV SOLN
INTRAVENOUS | Status: DC
  Administered 2016-08-08 – 2016-08-09 (×2): via INTRAVENOUS

## 2016-08-08 MED ORDER — PREGABALIN 75 MG PO CAPS
ORAL_CAPSULE | ORAL | Status: AC
Start: 2016-08-08 — End: 2016-08-08
  Administered 2016-08-08: 75 mg via ORAL
  Filled 2016-08-08: qty 1

## 2016-08-08 MED ORDER — VANCOMYCIN HCL IN DEXTROSE 1-5 GM/200ML-% IV SOLN
1000.0000 mg | Freq: Two times a day (BID) | INTRAVENOUS | Status: AC
  Administered 2016-08-08 – 2016-08-09 (×2): 1000 mg via INTRAVENOUS
  Filled 2016-08-08 (×2): qty 200

## 2016-08-08 MED ORDER — LACTATED RINGERS IV SOLN
INTRAVENOUS | Status: DC
  Administered 2016-08-08: 09:00:00 via INTRAVENOUS
  Administered 2016-08-08: 1000 mL via INTRAVENOUS

## 2016-08-08 MED ORDER — PROPOFOL 500 MG/50ML IV EMUL
INTRAVENOUS | Status: DC | PRN
  Administered 2016-08-08: 60 ug/kg/min via INTRAVENOUS

## 2016-08-08 MED ORDER — ACETAMINOPHEN 10 MG/ML IV SOLN
INTRAVENOUS | Status: DC | PRN
  Administered 2016-08-08: 1000 mg via INTRAVENOUS

## 2016-08-08 MED ORDER — CALCIUM CARBONATE-VITAMIN D 500-200 MG-UNIT PO TABS
1.0000 | ORAL_TABLET | Freq: Every day | ORAL | Status: DC
  Administered 2016-08-09 – 2016-08-11 (×3): 1 via ORAL
  Filled 2016-08-08 (×3): qty 1

## 2016-08-08 MED ORDER — METHOCARBAMOL 500 MG PO TABS
500.0000 mg | ORAL_TABLET | Freq: Four times a day (QID) | ORAL | Status: DC | PRN
  Administered 2016-08-09 (×2): 500 mg via ORAL
  Filled 2016-08-08 (×2): qty 1

## 2016-08-08 MED ORDER — FENTANYL CITRATE (PF) 100 MCG/2ML IJ SOLN
INTRAMUSCULAR | Status: AC
  Filled 2016-08-08: qty 2

## 2016-08-08 MED ORDER — PROPOFOL 10 MG/ML IV BOLUS
INTRAVENOUS | Status: AC
  Filled 2016-08-08: qty 20

## 2016-08-08 MED ORDER — METHOCARBAMOL 1000 MG/10ML IJ SOLN
500.0000 mg | Freq: Four times a day (QID) | INTRAVENOUS | Status: DC | PRN
  Filled 2016-08-08: qty 5

## 2016-08-08 MED ORDER — CALCIUM POLYCARBOPHIL 625 MG PO TABS
625.0000 mg | ORAL_TABLET | Freq: Three times a day (TID) | ORAL | Status: DC | PRN
  Filled 2016-08-08: qty 1

## 2016-08-08 MED ORDER — MIDAZOLAM HCL 2 MG/2ML IJ SOLN
INTRAMUSCULAR | Status: AC
  Filled 2016-08-08: qty 2

## 2016-08-08 MED ORDER — NEOMYCIN-POLYMYXIN B GU 40-200000 IR SOLN
Status: AC
  Filled 2016-08-08: qty 20

## 2016-08-08 MED ORDER — FERROUS SULFATE 325 (65 FE) MG PO TABS
325.0000 mg | ORAL_TABLET | Freq: Every day | ORAL | Status: DC
  Administered 2016-08-09 – 2016-08-11 (×3): 325 mg via ORAL
  Filled 2016-08-08 (×3): qty 1

## 2016-08-08 MED ORDER — TETRACAINE HCL 1 % IJ SOLN
INTRAMUSCULAR | Status: AC
  Filled 2016-08-08: qty 2

## 2016-08-08 MED ORDER — MEPERIDINE HCL 50 MG/ML IJ SOLN: 6.2500 mg | INTRAMUSCULAR | Status: DC | PRN

## 2016-08-08 MED ORDER — MIDAZOLAM HCL 5 MG/5ML IJ SOLN
INTRAMUSCULAR | Status: DC | PRN
  Administered 2016-08-08: 2 mg via INTRAVENOUS

## 2016-08-08 MED ORDER — DEXAMETHASONE SODIUM PHOSPHATE 10 MG/ML IJ SOLN
INTRAMUSCULAR | Status: DC | PRN
  Administered 2016-08-08: 10 mg via INTRAVENOUS

## 2016-08-08 MED ORDER — METOPROLOL SUCCINATE ER 50 MG PO TB24: 50.0000 mg | ORAL_TABLET | Freq: Every day | ORAL | Status: DC

## 2016-08-08 MED ORDER — BUPIVACAINE-EPINEPHRINE (PF) 0.25% -1:200000 IJ SOLN
INTRAMUSCULAR | Status: AC
  Filled 2016-08-08: qty 30

## 2016-08-08 MED ORDER — PHENYLEPHRINE HCL 10 MG/ML IJ SOLN
INTRAMUSCULAR | Status: DC | PRN
  Administered 2016-08-08: 50 ug via INTRAVENOUS
  Administered 2016-08-08: 100 ug via INTRAVENOUS
  Administered 2016-08-08: 200 ug via INTRAVENOUS
  Administered 2016-08-08 (×2): 100 ug via INTRAVENOUS

## 2016-08-08 MED ORDER — ADULT MULTIVITAMIN W/MINERALS CH
1.0000 | ORAL_TABLET | Freq: Every day | ORAL | Status: DC
  Administered 2016-08-09 – 2016-08-10 (×2): 1 via ORAL
  Filled 2016-08-08 (×2): qty 1

## 2016-08-08 SURGICAL SUPPLY — 54 items
AUTOTRANSFUS HAS 1/8 (MISCELLANEOUS) ×2
BAG COUNTER SPONGE EZ (MISCELLANEOUS) IMPLANT
BLADE DEBAKEY 8.0 (BLADE) ×2 IMPLANT
BLADE SAGITTAL WIDE XTHICK NO (BLADE) ×2 IMPLANT
BRUSH STANDARD PREP 14M (MISCELLANEOUS) ×2 IMPLANT
BUR EGG ELITE 5.0 (BURR) ×2 IMPLANT
CANISTER SUCT 1200ML W/VALVE (MISCELLANEOUS) ×2 IMPLANT
CANISTER SUCT 3000ML PPV (MISCELLANEOUS) ×2 IMPLANT
CAPT HIP TOTAL 2 ×2 IMPLANT
CATH FOL 2WAY LX 16X5 (CATHETERS) ×2 IMPLANT
CATH TRAY METER 16FR LF (MISCELLANEOUS) ×2 IMPLANT
CHLORAPREP W/TINT 26ML (MISCELLANEOUS) ×2 IMPLANT
DRAPE INCISE IOBAN 66X60 STRL (DRAPES) ×2 IMPLANT
DRAPE SHEET LG 3/4 BI-LAMINATE (DRAPES) ×2 IMPLANT
DRAPE TABLE BACK 80X90 (DRAPES) ×2 IMPLANT
DRSG AQUACEL AG ADV 3.5X10 (GAUZE/BANDAGES/DRESSINGS) IMPLANT
DRSG AQUACEL AG ADV 3.5X14 (GAUZE/BANDAGES/DRESSINGS) ×2 IMPLANT
ELECT BLADE 6 FLAT ULTRCLN (ELECTRODE) ×2 IMPLANT
ELECT REM PT RETURN 9FT ADLT (ELECTROSURGICAL) ×2
ELECTRODE REM PT RTRN 9FT ADLT (ELECTROSURGICAL) ×1 IMPLANT
GAUZE PETRO XEROFOAM 1X8 (MISCELLANEOUS) ×2 IMPLANT
GAUZE SPONGE 4X4 12PLY STRL (GAUZE/BANDAGES/DRESSINGS) ×2 IMPLANT
GLOVE INDICATOR 8.0 STRL GRN (GLOVE) ×2 IMPLANT
GLOVE SURG ORTHO 8.5 STRL (GLOVE) ×2 IMPLANT
GOWN STRL REUS W/ TWL LRG LVL3 (GOWN DISPOSABLE) ×2 IMPLANT
GOWN STRL REUS W/TWL LRG LVL3 (GOWN DISPOSABLE) ×2
GOWN STRL REUS W/TWL LRG LVL4 (GOWN DISPOSABLE) ×2 IMPLANT
KIT RM TURNOVER STRD PROC AR (KITS) ×2 IMPLANT
NEEDLE SPNL 18GX3.5 QUINCKE PK (NEEDLE) ×2 IMPLANT
NS IRRIG 1000ML POUR BTL (IV SOLUTION) ×2 IMPLANT
PACK HIP PROSTHESIS (MISCELLANEOUS) ×2 IMPLANT
PAD PREP 24X41 OB/GYN DISP (PERSONAL CARE ITEMS) ×2 IMPLANT
PIN STEIN THRED 5/32 (Pin) ×2 IMPLANT
PULSAVAC PLUS IRRIG FAN TIP (DISPOSABLE) ×2
SOL .9 NS 3000ML IRR  AL (IV SOLUTION) ×1
SOL .9 NS 3000ML IRR UROMATIC (IV SOLUTION) ×1 IMPLANT
SPONGE LAP 18X18 5 PK (GAUZE/BANDAGES/DRESSINGS) ×2 IMPLANT
STAPLER SKIN PROX 35W (STAPLE) ×2 IMPLANT
SUT BONE WAX W31G (SUTURE) ×2 IMPLANT
SUT DVC 2 QUILL PDO  T11 36X36 (SUTURE) ×1
SUT DVC 2 QUILL PDO T11 36X36 (SUTURE) ×1 IMPLANT
SUT ETHIBOND NAB CT1 #1 30IN (SUTURE) ×2 IMPLANT
SUT QUILL PDO 0 36 36 VIOLET (SUTURE) ×2 IMPLANT
SUT TICRON 2-0 30IN 311381 (SUTURE) ×8 IMPLANT
SUT VIC AB 2-0 CT1 27 (SUTURE) ×2
SUT VIC AB 2-0 CT1 TAPERPNT 27 (SUTURE) ×2 IMPLANT
SYR 20CC LL (SYRINGE) ×2 IMPLANT
SYR 30ML LL (SYRINGE) ×2 IMPLANT
SYSTEM AUTOTRANSFUS DUAL TROCR (MISCELLANEOUS) ×1 IMPLANT
TAPE TRANSPORE STRL 2 31045 (GAUZE/BANDAGES/DRESSINGS) ×2 IMPLANT
TIP BRUSH PULSAVAC PLUS 24.33 (MISCELLANEOUS) ×2 IMPLANT
TIP FAN IRRIG PULSAVAC PLUS (DISPOSABLE) ×1 IMPLANT
TUBE SUCT KAM VAC (TUBING) ×2 IMPLANT
WATER STERILE IRR 1000ML POUR (IV SOLUTION) ×2 IMPLANT

## 2016-08-08 NOTE — Progress Notes (Signed)
Notified floor RN of arrival in approx 15 minutes.

## 2016-08-08 NOTE — Progress Notes (Signed)
5 lbs bucks traction applied per Dr Sabra Heck verbal Order.

## 2016-08-08 NOTE — Progress Notes (Signed)
Autovac transferred over to hemovac per order. 100cc output from autovac.

## 2016-08-08 NOTE — Anesthesia Post-op Follow-up Note (Cosign Needed)
Anesthesia QCDR form completed.        

## 2016-08-08 NOTE — Op Note (Signed)
08/08/2016  10:26 AM  PATIENT:  Brittany Benton   MRN: [id number]  PRE-OPERATIVE DIAGNOSIS:  M16.11 Unilateral primary osteoarthritis, right hip  POST-OPERATIVE DIAGNOSIS:  Osteoarthritis right hip  PROCEDURE:  Procedure(s): TOTAL HIP ARTHROPLASTY  PREOPERATIVE INDICATIONS:    Brittany Benton is an 61 y.o. female who has a diagnosis of M16.11 Unilateral primary osteoarthritis,right hip and elected for surgical management after failing conservative treatment.  The risks benefits and alternatives were discussed with the patient including but not limited to the risks of nonoperative treatment, versus surgical intervention including infection, bleeding, nerve injury, periprosthetic fracture, the need for revision surgery, dislocation, leg length discrepancy, blood clots, cardiopulmonary complications, morbidity, mortality, among others, and they were willing to proceed.     OPERATIVE REPORT      SURGEON: Park Breed, MD    ASSISTANT:    Carlynn Spry, [facility]  ANESTHESIA: Spinal    COMPLICATIONS:  None.   DRAINS: 2 Autovacs  EBL:   300 cc                           REPLACED:   None    COMPONENTS:  Depuy AML  femur size 12.0 mm with a 32 mm/ +1 mm head ball and a Pinnacle 300 acetabular shell size 48 mm with a neutral polyethylene liner    PROCEDURE IN DETAIL:   The patient was met in the holding area and  identified.  The appropriate hip was identified and marked at the operative site.  The patient was then transported to the OR  and  placed under spinall anesthesia. Leg lengths showed minimal shortening on the right side. At that point, the patient was  placed in the lateral decubitus position with the operative side up and  secured to the operating room table and all bony prominences padded.     The operative lower extremity was prepped from the iliac crest to the distal leg.  Sterile draping was performed.  Time out was performed prior to incision.      A routine posterolateral  approach was utilized via sharp dissection  carried down to the subcutaneous tissue.  Gross bleeders were Bovie coagulated.  The iliotibial band was identified and incised along the length of the skin incision.  Self-retaining Charnley retractor was inserted.  With the hip internally rotated, the short external rotators  were identified, released, and tagged.  A Steinman pin was placed in the pelvis above the joint to evaluate leg length and bent appropriately.  The vastus was tagged to identify our starting length.   The hip capsule was released in a T-type fashion and the flaps tagged.    The hip was then dislocated posteriorly and the femoral neck was exposed.  I resected the femoral neck using the appropriate jig. This was performed at approximately a thumb's breadth above the lesser trochanter.    I then exposed the deep acetabulum, cleared out any tissue including the ligamentum teres.  A wing retractor was placed.  After adequate visualization, I excised the labrum, and then sequentially reamed.  I placed the trial acetabulum, which seated nicely, and then impacted the real cup into place.  Appropriate version and inclination was confirmed clinically matching their bony anatomy, and also with the use of the jig.  A trial polyethylene liner was placed and the wing retractor removed.    I then prepared the proximal femur using the cookie-cutter, the lateralizing reamer, and then  sequentially reamed and broached.  A trial broach, neck, and head was utilized, and I reduced the hip and it was found to have excellent stability with functional range of motion. The trial components were then removed, and the real polyethylene liner was placed with the lip directed posteriorly.  I then impacted the real femoral prosthesis into place into the appropriate version, slightly anteverted to the normal anatomy, and I impacted the real head ball into place. The hip was then reduced and taken through functional  range of motion and found to have excellent stability. Leg lengths were restored. Because of the patient's small stature, the shortest neck length was used.    I closed the T in the capsule and repaired the posterior capsule with #2 Ticron.   2 Autovac drains were placed. The Steinman pin was removed by hand.  I then irrigated the hip copiously again with pulse lavage, and repaired the fascia with #2 Quill, followed by 0 Quill for the subcutaneous tissue, and staplesl for the skin, Steri-Strips and Aquacel The wounds were injected. Sponge and needle counts were correct.  The patient was then awakened and returned to PACU in stable and satisfactory condition. There were no complications.  Park Breed, MD  08/08/2016 10:26 AM

## 2016-08-08 NOTE — Progress Notes (Signed)
Dr Randa Lynn in PACU, wants patient to receive The remainder of her iv fluid and then she should Be ok to transfer on to the floor.  1500 infused so far.

## 2016-08-08 NOTE — H&P (Signed)
THE PATIENT WAS SEEN PRIOR TO SURGERY TODAY.  HISTORY, ALLERGIES, HOME MEDICATIONS AND OPERATIVE PROCEDURE WERE REVIEWED. RISKS AND BENEFITS OF SURGERY DISCUSSED WITH PATIENT AGAIN.  NO CHANGES FROM INITIAL HISTORY AND PHYSICAL NOTED.    

## 2016-08-08 NOTE — NC FL2 (Signed)
[place] LEVEL OF CARE SCREENING TOOL     IDENTIFICATION  Patient Name: Brittany Benton Birthdate: 01/18/1956 Sex: female Admission Date (Current Location): 08/08/2016  [place] and [place] Number:  Engineering geologist and Address:  [facility], [address], [place], [place] [address]      Provider Number: [id number]  Attending Physician Name and Address:  Earnestine Leys, MD  Relative Name and Phone Number:       Current Level of Care: Hospital Recommended Level of Care: [place] Prior Approval Number:    Date Approved/Denied:   PASRR Number:  [id number])  Discharge Plan: SNF    Current Diagnoses: Patient Active Problem List   Diagnosis Date Noted  . Status post total hip replacement, 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  . Osteoarthritis of right hip 03/26/2016  . Status post bariatric surgery 03/26/2016  . Sinus tachycardia 08/01/2015  . Abnormal EKG 08/01/2015  . Left knee pain 02/15/2015  . Vitamin D deficiency 09/29/2014  . 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 Lump of right breast 09/22/2014  . Prediabetes 09/22/2014  . Pruritic dermatitis 09/22/2014  . Obstructive sleep apnea 09/22/2014  . Allergic rhinitis 09/15/2009  . Hypersomnia 09/14/2009  . Panic disorder 08/06/2008  . BMI 40.0-44.9, adult ([place]) 09/08/2007  . Essential (primary) hypertension 07/16/2007  . Hyperlipidemia 07/16/2007    Orientation RESPIRATION BLADDER Height & Weight     Self, Time, Situation, Place  Normal Continent Weight: 156 lb (70.8 kg) Height:  5' (152.4 cm)  BEHAVIORAL SYMPTOMS/MOOD NEUROLOGICAL BOWEL NUTRITION STATUS   (none)  (none) Continent Diet (Diet: clear liquid to be advanced )  AMBULATORY STATUS COMMUNICATION OF NEEDS Skin   Extensive Assist Verbally Surgical wounds (Incision: Right Hip. )                       Personal Care Assistance Level of Assistance  Bathing, Feeding, Dressing Bathing Assistance:  Limited assistance Feeding assistance: Independent Dressing Assistance: Limited assistance     Functional Limitations Info  Sight, Hearing, Speech Sight Info: Impaired Hearing Info: Adequate Speech Info: Adequate    SPECIAL CARE FACTORS FREQUENCY  PT (By licensed PT), OT (By licensed OT)     PT Frequency:  (5) OT Frequency:  (5)            Contractures      Additional Factors Info  Code Status, Allergies Code Status Info:  (Full Code. ) Allergies Info:  (Codeine, Morphine, Tape, Tramadol)           Current Medications (08/08/2016):  This is the current hospital active medication list Current Facility-Administered Medications  Medication Dose Route Frequency Provider Last Rate Last Dose  . 0.45 % sodium chloride infusion   Intravenous Continuous Earnestine Leys, MD 75 mL/hr at 08/08/16 1400    . acetaminophen (TYLENOL) tablet 650 mg  650 mg Oral Q6H PRN Earnestine Leys, MD       Or  . acetaminophen (TYLENOL) suppository 650 mg  650 mg Rectal Q6H PRN Earnestine Leys, MD      . acetaminophen (TYLENOL) tablet 1,000 mg  1,000 mg Oral Q6H Earnestine Leys, MD      . alum & mag hydroxide-simeth (MAALOX/MYLANTA) 200-200-20 MG/5ML suspension 30 mL  30 mL Oral Q4H PRN Earnestine Leys, MD      . bisacodyl (DULCOLAX) suppository 10 mg  10 mg Rectal Daily PRN Earnestine Leys, MD      . calcium-vitamin D Darron Doom WITH  D) 500-200 MG-UNIT per tablet 1 tablet  1 tablet Oral Daily Earnestine Leys, MD      . diphenhydrAMINE (BENADRYL) 12.5 MG/5ML elixir 12.5-25 mg  12.5-25 mg Oral Q4H PRN Earnestine Leys, MD      . Derrill Memo ON 08/09/2016] enoxaparin (LOVENOX) injection 30 mg  30 mg Subcutaneous Q12H Earnestine Leys, MD      . Derrill Memo ON 08/09/2016] ferrous sulfate tablet 325 mg  325 mg Oral Q breakfast Earnestine Leys, MD      . HYDROcodone-acetaminophen [facility]) 10-325 MG per tablet 1-2 tablet  1-2 tablet Oral Q4H PRN Earnestine Leys, MD      . HYDROmorphone (DILAUDID) injection 1 mg  1 mg Intravenous Q2H PRN Earnestine Leys, MD      . imipramine (TOFRANIL) tablet 50 mg  50 mg Oral BID Earnestine Leys, MD      . magnesium hydroxide (MILK OF MAGNESIA) suspension 30 mL  30 mL Oral Daily PRN Earnestine Leys, MD      . menthol-cetylpyridinium (CEPACOL) lozenge 3 mg  1 lozenge Oral PRN Earnestine Leys, MD       Or  . phenol (CHLORASEPTIC) mouth spray 1 spray  1 spray Mouth/Throat PRN Earnestine Leys, MD      . methocarbamol (ROBAXIN) tablet 500 mg  500 mg Oral Q6H PRN Earnestine Leys, MD       Or  . methocarbamol (ROBAXIN) 500 mg in dextrose 5 % 50 mL IVPB  500 mg Intravenous Q6H PRN Earnestine Leys, MD      . metoCLOPramide (REGLAN) tablet 5-10 mg  5-10 mg Oral Q8H PRN Earnestine Leys, MD       Or  . metoCLOPramide (REGLAN) injection 5-10 mg  5-10 mg Intravenous Q8H PRN Earnestine Leys, MD      . Derrill Memo ON 08/09/2016] metoprolol succinate (TOPROL-XL) 24 hr tablet 50 mg  50 mg Oral Daily Earnestine Leys, MD      . multivitamin with minerals tablet 1 tablet  1 tablet Oral Daily Earnestine Leys, MD      . ondansetron [facility]) tablet 4 mg  4 mg Oral Q6H PRN Earnestine Leys, MD       Or  . ondansetron [facility]) injection 4 mg  4 mg Intravenous Q6H PRN Earnestine Leys, MD      . Derrill Memo ON 08/09/2016] pantoprazole (PROTONIX) EC tablet 40 mg  40 mg Oral Daily Earnestine Leys, MD      . polycarbophil (FIBERCON) tablet 625 mg  625 mg Oral TID PRN Earnestine Leys, MD      . pregabalin (LYRICA) capsule 75 mg  75 mg Oral BID Earnestine Leys, MD      . [facility]) tablet 8.6 mg  1 tablet Oral BID Earnestine Leys, MD      . Derrill Memo ON 08/09/2016] sertraline (ZOLOFT) tablet 50 mg  50 mg Oral Daily Earnestine Leys, MD      . sodium phosphate (FLEET) 7-19 GM/118ML enema 1 enema  1 enema Rectal Once PRN Earnestine Leys, MD      . vancomycin (VANCOCIN) IVPB 1000 mg/200 mL premix  1,000 mg Intravenous Q12H Earnestine Leys, MD         Discharge Medications: Please see discharge summary for a list of discharge medications.  Relevant Imaging Results:  Relevant Lab  Results:   Additional Information  (SSN: [id number])  Ananias Kolander, Veronia Beets, LCSW

## 2016-08-08 NOTE — Anesthesia Procedure Notes (Signed)
Spinal  Patient location during procedure: OR Start time: 08/08/2016 7:40 AM End time: 08/08/2016 7:50 AM Staffing Anesthesiologist: Emmie Niemann Performed: anesthesiologist  Preanesthetic Checklist Completed: patient identified, site marked, surgical consent, pre-op evaluation, timeout performed, IV checked, risks and benefits discussed and monitors and equipment checked Spinal Block Patient position: sitting Prep: ChloraPrep Patient monitoring: heart rate, continuous pulse ox, blood pressure and cardiac monitor Approach: midline Location: L4-5 Injection technique: single-shot Needle Needle type: Introducer and Pencil-Tip  Needle gauge: 24 G Needle length: 9 cm Additional Notes Negative paresthesia. Negative blood return. Positive free-flowing CSF. Expiration date of kit checked and confirmed. Patient tolerated procedure well, without complications.

## 2016-08-08 NOTE — Progress Notes (Signed)
PT Cancellation Note  Patient Details Name: Brittany Benton MRN: [id number] DOB: 08/30/1955   Cancelled Treatment:    Reason Eval/Treat Not Completed: Other (comment). Evaluation attempted, however pt with no sensation in R LE. Unable to get out of bed at this time. Will re-attempt next date.   Hilary Milks 08/08/2016, 3:14 PM Greggory Stallion, PT, DPT [phone]

## 2016-08-08 NOTE — Progress Notes (Signed)
While rounding unit [place] visited the Pt. Pt met Pt. Pt's sister was bedside. Pt stated that her surgery went well and asked [place] to offer a prayer of thanksgivings, which [place] provided with a ministry of presence.    08/08/16 1500  Clinical Encounter Type  Visited With Patient;Family  Visit Type Initial;Spiritual support  Referral From Nurse  Consult/Referral To Chaplain  Spiritual Encounters  Spiritual Needs Prayer;Other (Comment)

## 2016-08-08 NOTE — Transfer of Care (Signed)
Immediate Anesthesia Transfer of Care Note  Patient: Brittany Benton  Procedure(s) Performed: Procedure(s): TOTAL HIP ARTHROPLASTY (Right)  Patient Location: PACU  Anesthesia Type:Spinal  Level of Consciousness: awake, alert  and oriented  Airway & Oxygen Therapy: Patient Spontanous Breathing and Patient connected to nasal cannula oxygen  Post-op Assessment: Report given to RN and Post -op Vital signs reviewed and stable  Post vital signs: Reviewed and stable  Last Vitals:  Vitals:   08/08/16 0619  BP: 138/84  Pulse: 64  Resp: 12  Temp: 36.2 C    Last Pain:  Vitals:   08/08/16 0619  TempSrc: Tympanic         Complications: No apparent anesthesia complications

## 2016-08-08 NOTE — Anesthesia Preprocedure Evaluation (Signed)
Anesthesia Evaluation  Patient identified by MRN, date of birth, ID band Patient awake    Reviewed: Allergy & Precautions, NPO status , Patient's Chart, lab work & pertinent test results  History of Anesthesia Complications Negative for: history of anesthetic complications  Airway Mallampati: II  TM Distance: >3 FB Neck ROM: Full    Dental no notable dental hx.    Pulmonary sleep apnea (has not needed CPAP since weight loss after bariatric surgery) , neg COPD,    breath sounds clear to auscultation- rhonchi (-) wheezing      Cardiovascular hypertension, Pt. on medications (-) CAD and (-) Past MI  Rhythm:Regular Rate:Normal - Systolic murmurs and - Diastolic murmurs    Neuro/Psych PSYCHIATRIC DISORDERS Anxiety negative neurological ROS     GI/Hepatic Neg liver ROS, GERD  ,  Endo/Other  negative endocrine ROSneg diabetes  Renal/GU Renal disease: hx of RCC s/p nephrectomy.     Musculoskeletal  (+) Arthritis ,   Abdominal (+) + obese,   Peds  Hematology negative hematology ROS (+)   Anesthesia Other Findings Past Medical History: No date: Anxiety No date: Arthritis     Comment: osteoarthritis -right hip No date: GERD (gastroesophageal reflux disease) No date: Hernia, incisional     Comment: after renal surgery No date: History of chicken pox No date: Hypertension No date: Renal cell carcinoma (HCC)     Comment: s/p right nephrectomy 1994 No date: Sleep apnea   Reproductive/Obstetrics                             Anesthesia Physical Anesthesia Plan  ASA: III  Anesthesia Plan: Spinal   Post-op Pain Management:    Induction:   Airway Management Planned: Natural Airway  Additional Equipment:   Intra-op Plan:   Post-operative Plan:   Informed Consent: I have reviewed the patients History and Physical, chart, labs and discussed the procedure including the risks, benefits and  alternatives for the proposed anesthesia with the patient or authorized representative who has indicated his/her understanding and acceptance.   Dental advisory given  Plan Discussed with: Anesthesiologist and CRNA  Anesthesia Plan Comments:         Lab Results  Component Value Date   WBC 7.2 07/25/2016   HGB 14.3 07/25/2016   HCT 43.2 07/25/2016   MCV 83.3 07/25/2016   PLT 297 07/25/2016    Anesthesia Quick Evaluation

## 2016-08-09 ENCOUNTER — Encounter: Payer: Self-pay | Admitting: Specialist

## 2016-08-09 LAB — BASIC METABOLIC PANEL
Anion gap: 6 (ref 5–15)
BUN: 18 mg/dL (ref 6–20)
CO2: 27 mmol/L (ref 22–32)
Calcium: 8.9 mg/dL (ref 8.9–10.3)
Chloride: 105 mmol/L (ref 101–111)
Creatinine, Ser: 0.76 mg/dL (ref 0.44–1.00)
Glucose, Bld: 115 mg/dL — ABNORMAL HIGH (ref 65–99)
Potassium: 4.1 mmol/L (ref 3.5–5.1)
SODIUM: 138 mmol/L (ref 135–145)

## 2016-08-09 LAB — CBC
HCT: 34.5 % — ABNORMAL LOW (ref 35.0–47.0)
Hemoglobin: 11.7 g/dL — ABNORMAL LOW (ref 12.0–16.0)
MCH: 27.6 pg (ref 26.0–34.0)
MCHC: 33.8 g/dL (ref 32.0–36.0)
MCV: 81.9 fL (ref 80.0–100.0)
Platelets: 241 10*3/uL (ref 150–440)
RBC: 4.22 MIL/uL (ref 3.80–5.20)
RDW: 14.3 % (ref 11.5–14.5)
WBC: 11.2 10*3/uL — ABNORMAL HIGH (ref 3.6–11.0)

## 2016-08-09 LAB — HEMOGLOBIN AND HEMATOCRIT, BLOOD
HEMATOCRIT: 28.7 % — AB (ref 35.0–47.0)
HEMOGLOBIN: 9.7 g/dL — AB (ref 12.0–16.0)

## 2016-08-09 MED ORDER — HYDROCODONE-ACETAMINOPHEN 5-325 MG PO TABS: 1.0000 | ORAL_TABLET | ORAL | Status: DC | PRN

## 2016-08-09 MED ORDER — SODIUM CHLORIDE 0.9 % IV BOLUS (SEPSIS)
500.0000 mL | Freq: Once | INTRAVENOUS | Status: AC
  Administered 2016-08-09: 500 mL via INTRAVENOUS

## 2016-08-09 MED ORDER — SODIUM CHLORIDE 0.9 % IV SOLN
INTRAVENOUS | Status: DC
  Administered 2016-08-09 – 2016-08-10 (×4): via INTRAVENOUS

## 2016-08-09 MED ORDER — SODIUM CHLORIDE 0.9 % IV BOLUS (SEPSIS)
1000.0000 mL | Freq: Once | INTRAVENOUS | Status: AC
  Administered 2016-08-09: 1000 mL via INTRAVENOUS

## 2016-08-09 NOTE — Progress Notes (Signed)
OT Cancellation Note  Patient Details Name: RAELA BOHL MRN: [id number] DOB: 1955-04-20   Cancelled Treatment:    Reason Eval/Treat Not Completed: Medical issues which prohibited therapy (Pt. has Low BP. Will continue to monitor, and eval when appropriate.)  Harrel Carina, MS, OTR/L 08/09/2016, 11:17 AM

## 2016-08-09 NOTE — Progress Notes (Signed)
Pt resting with eyes closed semi-fowlers. Family at bedside. Pt alert and oriented x 3. Pt arousable. BP 95/46 P 89 resp 16 Ax temp 97.7 Oxy sat 92% RA. Pt denies pain. IV running NS at 125 cc/hr. Pt states she is "awaiting supper".

## 2016-08-09 NOTE — Progress Notes (Signed)
PT Cancellation Note  Patient Details Name: Brittany Benton MRN: [id number] DOB: 1956-01-29   Cancelled Treatment:    Reason Eval/Treat Not Completed: Other (comment). Pt continues with low BP. Per MD, will be bedrest this date and PT to start tomorrow.   [name] 08/09/2016, 1:16 PM  Greggory Stallion, PT, DPT [phone]

## 2016-08-09 NOTE — Progress Notes (Signed)
PT Cancellation Note  Patient Details Name: Brittany Benton MRN: [id number] DOB: [birth year]   Cancelled Treatment:    Reason Eval/Treat Not Completed: Other (comment)  Pt's BP in supine 59/30, RN in room and assessed BP. RN stated she will inform MD. PT will re-attempt at a later date/time as tolerated by pt.    Isaul Landi L 08/09/2016, 10:18 AM   Geoffry Paradise, PT,DPT 08/09/16 10:18 AM Phone: [phone] Fax: [phone]

## 2016-08-09 NOTE — Progress Notes (Signed)
Pt in semi fowlers position. BP 114/60 p 86. Pt resting comfortably. Pt remains alert and oriented x 3. Family at bedside and supportive. Will continue to monitor.

## 2016-08-09 NOTE — Progress Notes (Signed)
OT Cancellation Note  Patient Details Name: Brittany Benton MRN: [id number] DOB: 04-07-1956   Cancelled Treatment:    Reason Eval/Treat Not Completed: Medical issues which prohibited therapy. Pt continues to have low BP. On bed rest this date per MD. Will re-attempt OT evaluation tomorrow.  Jeni Salles, MPH, MS, OTR/L ascom [phone] 08/09/16, 3:52 PM

## 2016-08-09 NOTE — Progress Notes (Addendum)
BP 62/42. NS IV bolus started. Dr. Sabra Heck paged. Pt sitting semi fowlers in bed. Pt is alert and oriented x 3. Pt lethargic but arousable. Pt denies pain. Rapid Response paged. Pt placed in trendelenberg.  Monitoring.

## 2016-08-09 NOTE — Progress Notes (Signed)
Rept to Dr. Sabra Heck regarding pt's blood pressure. Order received for 500 CC normal saline IV bolus. Family at bedside and supportive. Pt remains lethargic but arousable and alert and oriented x 3. Rapid Response in. Monitoring pt's condition.

## 2016-08-09 NOTE — Progress Notes (Signed)
Subjective: 1 Day Post-Op Procedure(s) (LRB): TOTAL HIP ARTHROPLASTY (Right)    Patient reports pain as none. Awake and alert.  BP has been low all morning but she is bright and alert.  Will continue IV fluids. Hospitalist will see soon.  Objective:   VITALS:   Vitals:   08/09/16 0532 08/09/16 0807  BP: 113/65 (!) 99/35  Pulse: 93 87  Resp: 18 16  Temp: 97.7 F (36.5 C) 97.5 F (36.4 C)    Neurologically intact ABD soft Neurovascular intact Sensation intact distally Intact pulses distally Dorsiflexion/Plantar flexion intact Incision: no drainage  LABS  Recent Labs  08/08/16 1423 08/09/16 0531  HGB 12.3 11.7*  HCT 36.7 34.5*  WBC 12.4* 11.2*  PLT 225 241     Recent Labs  08/08/16 1423 08/09/16 0531  NA  --  138  K  --  4.1  BUN  --  18  CREATININE 0.83 0.76  GLUCOSE  --  115*    No results for input(s): LABPT, INR in the last 72 hours.   Assessment/Plan: 1 Day Post-Op Procedure(s) (LRB): TOTAL HIP ARTHROPLASTY (Right)   Continue IV fluids Recheck H/H Bedrest for now PT tomorrow

## 2016-08-09 NOTE — Progress Notes (Signed)
500 cc NS IV bolus in. BP 103/81 P 77. Pt talkative. Pt's family at bedside and supportive. Rapid Response out of room. Monitoring.

## 2016-08-09 NOTE — Progress Notes (Signed)
Pts. Husband refused for pt. To be woke up for midnight vital signs.

## 2016-08-09 NOTE — Progress Notes (Signed)
Rept to Dr. Verdell Carmine. Pt's BP now 74/39 p 88. Per his order, 1000 cc IV NS bolus started. Pt remains in trendelenberg. Pt remains talkative and family at bedside and supportive. Monitoring.

## 2016-08-09 NOTE — Progress Notes (Signed)
Chaplain responded to a RR page for a Pt in [place]. Pt presented low blood pressure. Pt's husband and sister at bedside. [place] provide support and presence for the family as the MT was assessing Pt. CH made a follow up visit with Pt again at 11:11am. Pt stated that her blood pressure was low and requested prayers for healing, which [place] provided.     08/09/16 1100  Clinical Encounter Type  Visited With Patient;Patient and family together  Visit Type Follow-up;Spiritual support;Code;Trauma;Other (Comment)  Referral From Nurse  Consult/Referral To Nurse  Spiritual Encounters  Spiritual Needs Prayer;Other (Comment)

## 2016-08-09 NOTE — Anesthesia Postprocedure Evaluation (Signed)
Anesthesia Post Note  Patient: Brittany Benton  Procedure(s) Performed: Procedure(s) (LRB): TOTAL HIP ARTHROPLASTY (Right)  Patient location during evaluation: Nursing Unit Anesthesia Type: Spinal Level of consciousness: awake, awake and alert and oriented Pain management: pain level controlled Vital Signs Assessment: post-procedure vital signs reviewed and stable Respiratory status: spontaneous breathing and nonlabored ventilation Cardiovascular status: stable Postop Assessment: no headache, no backache, patient able to bend at knees and adequate PO intake Anesthetic complications: no     Last Vitals:  Vitals:   08/08/16 2200 08/09/16 0532  BP: 119/60 113/65  Pulse: 86 93  Resp:  18  Temp: 36.8 C 36.5 C    Last Pain:  Vitals:   08/09/16 0606  TempSrc:   PainSc: Yolanda Manges

## 2016-08-09 NOTE — Care Management Note (Signed)
Case Management Note  Patient Details  Name: Brittany Benton MRN: [id number] Date of Birth: 04/01/1956  Subjective/Objective:   Patient a bundle patient. OP PT has been arranged and scheduled. Equipment has been ordered by bundle case manager. Will use ASA at DC. Will monitor chart for acute needs until discharge.                Action/Plan:   Expected Discharge Date:                  Expected Discharge Plan:  OP Rehab  In-House Referral:     Discharge planning Services  CM Consult  Post Acute Care Choice:  NA Choice offered to:     DME Arranged:    DME Agency:     HH Arranged:    HH Agency:     Status of Service:  In process, will continue to follow  If discussed at Long Length of Stay Meetings, dates discussed:    Additional Comments:  Jolly Mango, RN 08/09/2016, 11:58 AM

## 2016-08-09 NOTE — Progress Notes (Signed)
Pt 1000cc IV NS bolus in. Pt BP 100/54. Trial out of trendelenberg. Pt still alert and oriented x 3. Family at bedside and supportive. Monitoring.

## 2016-08-09 NOTE — Consult Note (Signed)
[facility] [place] NAME: Brittany Benton    MR#:  [id number]  DATE OF BIRTH:  Jan 17, 1956  DATE OF CONSULT:  08/09/2016  PRIMARY CARE PHYSICIAN: Lelon Huh, MD   REQUESTING/REFERRING PHYSICIAN: Dr. Earnestine Leys  CHIEF COMPLAINT:  No chief complaint on file. Hypotension  HISTORY OF PRESENT ILLNESS:  Brittany Benton  is a 61 y.o. female with a known history of Osteoarthritis, hypertension, GERD, history of renal cell carcinoma, obstructive sleep apnea who presented to the hospital for elective right-sided hip replacement for severe osteoarthritis. Patient is postop day #1 today and noted to be severely hypotensive with systolic blood pressures in the 70s. Clinically patient was asymptomatic and not having any dizziness, chest pain, shortness of breath nausea vomiting or any other associated symptoms. Patient was given a IV fluid bolus and blood pressure did improve but she continues to be more on the hypotensive side and therefore hospitalist services were contacted for consultation.  PAST MEDICAL HISTORY:   Past Medical History:  Diagnosis Date  . Anxiety   . Arthritis    osteoarthritis -right hip  . GERD (gastroesophageal reflux disease)   . Hernia, incisional    after renal surgery  . History of chicken pox   . Hypertension   . Renal cell carcinoma ([place])    s/p right nephrectomy 1994  . Sleep apnea     PAST SURGICAL HISTOIRY:   Past Surgical History:  Procedure Laterality Date  . APPENDECTOMY  1994  . BREAST BIOPSY Right 1991   Negative  . CHOLECYSTECTOMY  1994  . LAPAROSCOPIC GASTRIC SLEEVE RESECTION  02/02/2016   Dr Duke Sal[facility] at [facility]  . NEPHRECTOMY  1994   Renal Cell Carcinoma  . parotid gland removal  1990   also removed a tumor  . TONSILLECTOMY    . TOTAL HIP ARTHROPLASTY Right 08/08/2016   Procedure: TOTAL HIP ARTHROPLASTY;  Surgeon: Earnestine Leys, MD;  Location: [facility] ORS;  Service: Orthopedics;  Laterality: Right;     SOCIAL HISTORY:   Social History  Substance Use Topics  . Smoking status: Never Smoker  . Smokeless tobacco: Never Used  . Alcohol use 0.0 oz/week     Comment: occasional    FAMILY HISTORY:   Family History  Problem Relation Age of Onset  . Osteoporosis Mother   . Dementia Mother   . Heart disease Father   . Heart attack Father   . Breast cancer Maternal Aunt     DRUG ALLERGIES:   Allergies  Allergen Reactions  . Codeine Nausea And Vomiting  . Morphine Hives and Swelling  . Tape Other (See Comments)    blisters  . Tramadol Other (See Comments)    Hallucinations    REVIEW OF SYSTEMS:   Review of Systems  Constitutional: Negative for fever and weight loss.  HENT: Negative for congestion, nosebleeds and tinnitus.   Eyes: Negative for blurred vision, double vision and redness.  Respiratory: Negative for cough, hemoptysis and shortness of breath.   Cardiovascular: Negative for chest pain, orthopnea, leg swelling and PND.  Gastrointestinal: Negative for abdominal pain, diarrhea, melena, nausea and vomiting.  Genitourinary: Negative for dysuria, hematuria and urgency.  Musculoskeletal: Negative for falls and joint pain.  Neurological: Negative for dizziness, tingling, sensory change, focal weakness, seizures, weakness and headaches.  Endo/Heme/Allergies: Negative for polydipsia. Does not bruise/bleed easily.  Psychiatric/Behavioral: Negative for depression and memory loss. The patient is not nervous/anxious.      MEDICATIONS AT  HOME:   Prior to Admission medications   Medication Sig Start Date End Date Taking? Authorizing Provider  Ca Phosphate-Cholecalciferol (CALCIUM 500 + D3) 250-500 MG-UNIT CHEW Chew 1 tablet by mouth daily.   Yes Historical Provider, MD  imipramine (TOFRANIL) 50 MG tablet TAKE ONE TABLET BY MOUTH TWICE DAILY 06/18/16  Yes Birdie Sons, MD  metoprolol succinate (TOPROL-XL) 50 MG 24 hr tablet Take 1 tablet (50 mg total) by mouth daily. Take  with or immediately following a meal. 08/01/16  Yes Birdie Sons, MD  Multiple Vitamins-Minerals (MULTIVITAMIN ADULT PO) Take 1 tablet by mouth daily. 07/16/07  Yes Historical Provider, MD  omeprazole (PRILOSEC) 20 MG capsule Take 20 mg by mouth daily before breakfast.    Yes Historical Provider, MD  sertraline (ZOLOFT) 50 MG tablet TAKE ONE AND A HALF TABLETS BY MOUTH EVERY DAY 04/19/16  Yes Birdie Sons, MD  Wheat Dextrin (BENEFIBER DRINK MIX PO) Take 10 mLs by mouth 3 (three) times daily as needed.   Yes Historical Provider, MD      VITAL SIGNS:  Blood pressure (!) 86/42, pulse 87, temperature 97.5 F (36.4 C), temperature source Oral, resp. rate 16, height 5' (1.524 m), weight 70.8 kg (156 lb), SpO2 93 %.  PHYSICAL EXAMINATION:  GENERAL:  61 y.o.-year-old patient lying in bed in no acute distress.  EYES: Pupils equal, round, reactive to light and accommodation. No scleral icterus. Extraocular muscles intact.  HEENT: Head atraumatic, normocephalic. Oropharynx and nasopharynx clear.  NECK:  Supple, no jugular venous distention. No thyroid enlargement, no tenderness.  LUNGS: Normal breath sounds bilaterally, no wheezing, rales, rhonchi . No use of accessory muscles of respiration.  CARDIOVASCULAR: S1, S2, RRR. No murmurs, rubs, gallops, clicks.  ABDOMEN: Soft, nontender, nondistended. Bowel sounds present. No organomegaly or mass.  EXTREMITIES: No pedal edema, cyanosis, or clubbing. Right Hip dressing from recent surgery and right leg in tractions NEUROLOGIC: Cranial nerves II through XII are intact. No focal motor or sensory deficits appreciated bilaterally  PSYCHIATRIC: The patient is alert and oriented x 3. Good affect SKIN: No obvious rash, lesion, or ulcer.   LABORATORY PANEL:   CBC  Recent Labs Lab 08/09/16 0531 08/09/16 1230  WBC 11.2*  --   HGB 11.7* 9.7*  HCT 34.5* 28.7*  PLT 241  --     ------------------------------------------------------------------------------------------------------------------  Chemistries   Recent Labs Lab 08/09/16 0531  NA 138  K 4.1  CL 105  CO2 27  GLUCOSE 115*  BUN 18  CREATININE 0.76  CALCIUM 8.9   ------------------------------------------------------------------------------------------------------------------  Cardiac Enzymes No results for input(s): TROPONINI in the last 168 hours. ------------------------------------------------------------------------------------------------------------------  RADIOLOGY:  Dg Hip Unilat W Or W/o Pelvis 1v Right  Result Date: 08/08/2016 CLINICAL DATA:  Status post RIGHT hemiarthroplasty. EXAM: DG HIP (WITH OR WITHOUT PELVIS) 1V RIGHT COMPARISON:  None. FINDINGS: Satisfactory postoperative appearance. Acetabular and femoral components. Good position. Surgical drain. IMPRESSION: No adverse features. Electronically Signed   By: Staci Righter M.D.   On: 08/08/2016 11:38     IMPRESSION AND PLAN:   61 year old female with past medical history of renal cell carcinoma status post right-sided nephrectomy in the past, hypertension, osteoarthritis and neuropathy who presented to the hospital for elective right-sided hip replacement and postoperatively noted to be hypotensive.  1. Hypotension-secondary to dehydration postoperatively and also patient getting high doses of narcotics. -Mental status stable, patient is asymptomatic. Hypotension is improved to with aggressive IV fluid hydration which I will continue. -We'll check TSH,  cortisol level. -Follow clinically. No evidence of acute kidney injury.  2. Status post right hip replacement postop day #1-continue pain control and further care as per orthopedics. -We'll start physical therapy tomorrow.  3. Essential hypertension-discontinue Toprol given her relative hypotension presently.  4. GERD-continue Protonix.  5. Neuropathy-continue  Lyrica.  6. Depression-continue Zoloft.  Thanks for the consult and will follow along with you.  All the records are reviewed and case discussed with Consulting provider. Management plans discussed with the patient, family and they are in agreement.  CODE STATUS: Full code  TOTAL TIME TAKING CARE OF THIS PATIENT: 40 minutes.    Henreitta Leber M.D on 08/09/2016 at 3:27 PM  Between 7am to 6pm - Pager - [phone]  After 6pm go to [email or web address] - password [email or web address] [facility]  [facility]  Office  [phone]  CC: Primary care Physician: Lelon Huh, MD

## 2016-08-10 LAB — CBC
HCT: 26.8 % — ABNORMAL LOW (ref 35.0–47.0)
HEMOGLOBIN: 8.9 g/dL — AB (ref 12.0–16.0)
MCH: 27.5 pg (ref 26.0–34.0)
MCHC: 33.2 g/dL (ref 32.0–36.0)
MCV: 82.7 fL (ref 80.0–100.0)
Platelets: 176 10*3/uL (ref 150–440)
RBC: 3.24 MIL/uL — ABNORMAL LOW (ref 3.80–5.20)
RDW: 14.5 % (ref 11.5–14.5)
WBC: 9.2 10*3/uL (ref 3.6–11.0)

## 2016-08-10 LAB — SURGICAL PATHOLOGY

## 2016-08-10 MED ORDER — HYDROCODONE-ACETAMINOPHEN 5-325 MG PO TABS: 1.0000 | ORAL_TABLET | Freq: Four times a day (QID) | ORAL | 0 refills | Status: DC | PRN

## 2016-08-10 MED ORDER — GABAPENTIN 400 MG PO CAPS: 400.0000 mg | ORAL_CAPSULE | Freq: Two times a day (BID) | ORAL | 3 refills | Status: DC

## 2016-08-10 MED ORDER — ASPIRIN EC 325 MG PO TBEC: 325.0000 mg | DELAYED_RELEASE_TABLET | Freq: Two times a day (BID) | ORAL | 0 refills | Status: DC

## 2016-08-10 NOTE — Progress Notes (Signed)
Brittany Benton NAME: Brittany Benton    MR#:  [id number]  DATE OF BIRTH:  06-20-1955  SUBJECTIVE:  CHIEF COMPLAINT:  No chief complaint on file.  - Status post right hip surgery for arthritis, postop day 2 today. -Medicine consult for  hypotension. Blood pressure is much improved with fluids.  REVIEW OF SYSTEMS:  Review of Systems  Constitutional: Negative for chills, fever and malaise/fatigue.  HENT: Negative for ear discharge, hearing loss and nosebleeds.   Eyes: Negative for blurred vision and double vision.  Respiratory: Negative for cough, shortness of breath and wheezing.   Cardiovascular: Negative for chest pain, palpitations and leg swelling.  Gastrointestinal: Negative for abdominal pain, constipation, diarrhea, nausea and vomiting.  Genitourinary: Negative for dysuria.  Musculoskeletal: Positive for joint pain and myalgias.  Neurological: Negative for dizziness, speech change, focal weakness, seizures and headaches.  Psychiatric/Behavioral: Negative for depression.    DRUG ALLERGIES:   Allergies  Allergen Reactions  . Codeine Nausea And Vomiting  . Morphine Hives and Swelling  . Tape Other (See Comments)    blisters  . Tramadol Other (See Comments)    Hallucinations    VITALS:  Blood pressure 116/90, pulse (!) 118, temperature 98.2 F (36.8 C), temperature source Oral, resp. rate 16, height 5' (1.524 m), weight 70.8 kg (156 lb), SpO2 94 %.  PHYSICAL EXAMINATION:  Physical Exam  GENERAL:  61 y.o.-year-old patient lying in the bed with no acute distress.  EYES: Pupils equal, round, reactive to light and accommodation. No scleral icterus. Extraocular muscles intact.  HEENT: Head atraumatic, normocephalic. Oropharynx and nasopharynx clear.  NECK:  Supple, no jugular venous distention. No thyroid enlargement, no tenderness.  LUNGS: Normal breath sounds bilaterally, no wheezing, rales,rhonchi or crepitation. No use  of accessory muscles of respiration. Decreased bibasilar breath sounds. CARDIOVASCULAR: S1, S2 normal. No murmurs, rubs, or gallops.  ABDOMEN: Soft, nontender, nondistended. Bowel sounds present. No organomegaly or mass.  EXTREMITIES: No pedal edema, cyanosis, or clubbing. Right hip dressing, more posteriorly and TENS stimulator in place NEUROLOGIC: Cranial nerves II through XII are intact. Muscle strength 5/5 in all extremities. Sensation intact. Gait not checked.  PSYCHIATRIC: The patient is alert and oriented x 3.  SKIN: No obvious rash, lesion, or ulcer.    LABORATORY PANEL:   CBC  Recent Labs Lab 08/10/16 0547  WBC 9.2  HGB 8.9*  HCT 26.8*  PLT 176   ------------------------------------------------------------------------------------------------------------------  Chemistries   Recent Labs Lab 08/09/16 0531  NA 138  K 4.1  CL 105  CO2 27  GLUCOSE 115*  BUN 18  CREATININE 0.76  CALCIUM 8.9   ------------------------------------------------------------------------------------------------------------------  Cardiac Enzymes No results for input(s): TROPONINI in the last 168 hours. ------------------------------------------------------------------------------------------------------------------  RADIOLOGY:  No results found.  EKG:   Orders placed or performed in visit on 06/25/16  . EKG 12-Lead    ASSESSMENT AND PLAN:   61 year old female with past medical history significant for osteoarthritis, hypertension, GERD, history of renal cell carcinoma, obstructive sleep apnea admitted to hospital for elective right hip replacement surgery secondary to arthritis. Medical consult requested for hypotension.  #1 hypertension-likely secondary to hypovolemia. -Takes low-dose Toprol at home which is on hold at this time. -Blood pressure has improved significantly with IV fluids.  #2 sinus tachycardia-Toprol is on hold. Can be restarted when her blood pressure is  improved  #3 osteoarthritis-status post right hip replacement surgery, postoperative day 2 today. Abdomen pain is much Well  controlled. Continue further care per orthopedics. -Physical therapy consulted  #4 GERD-on Protonix  #5 depression-continue Zoloft  #6 DVT prophylaxis-on Lovenox   Medically stable for discharge tomorrow. Will sign off. Call if any questions. Patient will be discharged home with home health.   All the records are reviewed and case discussed with Care Management/Social Workerr. Management plans discussed with the patient, family and they are in agreement.  CODE STATUS: Full code  TOTAL TIME TAKING CARE OF THIS PATIENT: 28 minutes.   POSSIBLE D/C TOMORROW,  DEPENDING ON CLINICAL CONDITION.   Gladstone Lighter M.D on 08/10/2016 at 1:31 PM  Between 7am to 6pm - Pager - [phone]  After 6pm go to [email or web address] - password [email or web address] [facility]  Office  [phone]  CC: Primary care physician; Lelon Huh, MD

## 2016-08-10 NOTE — Care Management Note (Signed)
Case Management Note  Patient Details  Name: Brittany Benton MRN: [id number] Date of Birth: 1956-01-25  Subjective/Objective:  Met with patient to discuss PT recommendations. Patient reluctant to have home health but after speaking with Dr. Sabra Heck she is agreeable. [facility]erral to [facility]. [facility] bundle case manager is having a walker and bsc delivered.                    Action/Plan: [facility] for PT.   Expected Discharge Date:    08/11/2016              Expected Discharge Plan:  [facility]  In-House Referral:     Discharge planning Services  CM Consult  Post Acute Care Choice:  Home Health Choice offered to:  Patient  DME Arranged:    DME Agency:     HH Arranged:  PT [place]:  [facility]  Status of Service:  In process, will continue to follow  If discussed at Long Length of Stay Meetings, dates discussed:    Additional Comments:  Jolly Mango, RN 08/10/2016, 12:29 PM

## 2016-08-10 NOTE — Progress Notes (Signed)
Patient BP 99/42, patient denies any dizziness. Patient fingernails are blue and oxygen saturations down to 89%, Rn placed on 2LO2 and now 95%. MD notified. No new orders at this time.  Deri Fuelling, RN

## 2016-08-10 NOTE — Plan of Care (Signed)
Problem: Physical Regulation: Goal: Ability to maintain clinical measurements within normal limits will improve Outcome: Progressing Patient last BP 111/54. MD notified and BP is doing a lot higher than yesterday. Patient still having hypotensive episodes when getting up.   Deri Fuelling, RN

## 2016-08-10 NOTE — Discharge Summary (Signed)
Physician Discharge Summary  Patient ID: Brittany Benton MRN: [id number] DOB/AGE: October 22, 1955 61 y.o.  Admit date: 08/08/2016 Discharge date: 08/10/2016  Admission Diagnoses: Severe arthritis right hip  Discharge Diagnoses:  Active Problems:   Status post total hip replacement, right   Discharged Condition: good  Hospital Course: The patient had satisfactory right total hip replacement on 08/08/2016.  Postop x-rays were excellent.  She had little or no pain.  She remained hypotensive.  The day after surgery and received increase fluids which corrected this.  PT started on 08/10/2016.  She is to be discharged tomorrow 08/11/2016 to home with home health PT.  Consults: Hospitalist services  Significant Diagnostic Studies: radiology: X-Ray: Satisfactory right total hip replacement  Treatments: antibiotics: vancomycin  Discharge Exam: Blood pressure 116/90, pulse (!) 118, temperature 98.2 F (36.8 C), temperature source Oral, resp. rate 16, height 5' (1.524 m), weight 70.8 kg (156 lb), SpO2 94 %. The right leg and hip were stable.  Leg lengths are good.  Dressing is changed and wound is benign.  Disposition: Final discharge disposition not confirmed  Discharge Instructions    Ambulatory referral to [place]    Complete by:  As directed    Please evaluate [name] for admission to [facility].  Disciplines requested: Physical Therapy   Services to provide:  Strengthening Exercises  Physician to follow patient's care (the person listed here will be responsible for signing ongoing orders): Referring Provider  Requested Start of Care Date: Tomorrow  I certify that this patient is under my care and that I, or a Nurse Practitioner or Physician's Assistant working with me, had a face-to-face encounter that meets the physician face-to-face requirements with patient on 08/10/2016. The encounter with the patient was in whole, or in part for the following medical condition(s) which is  the primary reason for home health care (List medical condition). Total hip replacement  Special Instructions:  PWB right leg   Does the patient have Medicare or Medicaid?:  No   The encounter with the patient was in whole, or in part, for the following medical condition, which is the primary reason for home health care:  right total hip replacement   Reason for Medically Necessary Home Health Services:  Therapy- Personnel officer, Public librarian   My clinical findings support the need for the above services:  Unable to leave home safely without assistance and/or assistive device   I certify that, based on my findings, the following services are medically necessary home health services:  Physical therapy   Further, I certify that my clinical findings support that this patient is homebound due to:  Ambulates short distances less than 300 feet   Call MD for:  persistant nausea and vomiting    Complete by:  As directed    Call MD for:  redness, tenderness, or signs of infection (pain, swelling, redness, odor or green/yellow discharge around incision site)    Complete by:  As directed    Call MD for:  severe uncontrolled pain    Complete by:  As directed    Call MD for:  temperature >100.4    Complete by:  As directed    Diet - low sodium heart healthy    Complete by:  As directed    Discharge instructions    Complete by:  As directed    Partial weight on right leg with walker. Enteric-coated aspirin 325 mg twice a day for 6 weeks Ice to right  hip Return to clinic next Wednesday   Increase activity slowly    Complete by:  As directed    Partial weight bearing    Complete by:  As directed    50%     Allergies as of 08/10/2016      Reactions   Codeine Nausea And Vomiting   Morphine Hives, Swelling   Tape Other (See Comments)   blisters   Tramadol Other (See Comments)   Hallucinations      Medication List    TAKE these medications   aspirin EC 325 MG  tablet Take 1 tablet (325 mg total) by mouth 2 (two) times daily.   BENEFIBER DRINK MIX PO Take 10 mLs by mouth 3 (three) times daily as needed.   CALCIUM 500 + D3 250-500 MG-UNIT Chew Generic drug:  Ca Phosphate-Cholecalciferol Chew 1 tablet by mouth daily.   gabapentin 400 MG capsule Commonly known as:  NEURONTIN Take 1 capsule (400 mg total) by mouth 2 (two) times daily.   HYDROcodone-acetaminophen 5-325 MG tablet Commonly known as:  NORCO Take 1 tablet by mouth every 6 (six) hours as needed.   imipramine 50 MG tablet Commonly known as:  TOFRANIL TAKE ONE TABLET BY MOUTH TWICE DAILY   metoprolol succinate 50 MG 24 hr tablet Commonly known as:  TOPROL-XL Take 1 tablet (50 mg total) by mouth daily. Take with or immediately following a meal.   MULTIVITAMIN ADULT PO Take 1 tablet by mouth daily.   omeprazole 20 MG capsule Commonly known as:  PRILOSEC Take 20 mg by mouth daily before breakfast.   sertraline 50 MG tablet Commonly known as:  ZOLOFT TAKE ONE AND A HALF TABLETS BY MOUTH EVERY DAY            Durable Medical Equipment        Start     Ordered   08/10/16 1310  For home use only DME Walker  [facility])  Once    Question:  Patient needs a walker to treat with the following condition  Answer:  Status post total hip replacement, right   08/10/16 1318   08/08/16 1349  DME Walker rolling  Once    Question:  Patient needs a walker to treat with the following condition  Answer:  Status post total hip replacement, right   08/08/16 1349       Signed: Delva Derden E 08/10/2016, 1:19 PM

## 2016-08-10 NOTE — Progress Notes (Signed)
Clinical Social Worker (CSW) received SNF consult. PT is recommending home health. RN case manager aware of above. Please reconsult if future social work needs arise. CSW signing off.   Nechama Escutia, LCSW [phone] 

## 2016-08-10 NOTE — Progress Notes (Signed)
Physical Therapy Treatment Patient Details Name: Brittany Benton MRN: [id number] DOB: September 30, 1955 Today's Date: 08/10/2016    History of Present Illness Pt. is a 61 y.o. female who was admitted to [facility] for an elective Right Total Hip replacement. Pt. PMHx includes: Osteoarthritis, HTN, GERD, Renal Cell Carcinoma, Anxiety, Obstructive Sleep Apnea, Hypertensive.     PT Comments    Pt is making good progress towards goals with improved ambulation distance this session. Pt appears motivated to participate, however fatigues quickly and still has low BP with ambulation (95/53). RN aware. Needs chair follow for safety as she does not complain of dizziness. Good endurance with HEP and reviewed post hip precautions. Needs to do stair training prior to dc home. Will continue to assess.   Follow Up Recommendations  Home health PT     Equipment Recommendations  Rolling walker with 5" wheels;3in1 (PT)    Recommendations for Other Services       Precautions / Restrictions Precautions Precautions: Posterior Hip Precaution Booklet Issued: Yes (comment) Restrictions Weight Bearing Restrictions: Yes RLE Weight Bearing: Partial weight bearing    Mobility  Bed Mobility Overal bed mobility: Needs Assistance Bed Mobility: Sit to Supine     Supine to sit: Min assist Sit to supine: Mod assist   General bed mobility comments: assist for bringing B LEs up onto bed and assist for trunk support. Pt limited by pain  Transfers Overall transfer level: Needs assistance Equipment used: Rolling walker (2 wheeled) Transfers: Sit to/from Stand Sit to Stand: Min assist         General transfer comment: multiple transfers practiced with pt able to improve from needing min assist to cga. Pt cued for safe technique with pushing from seated surface. Upright stance performed with ability to maintain correct WB status  Ambulation/Gait Ambulation/Gait assistance: Min guard Ambulation Distance (Feet): 45  Feet Assistive device: Rolling walker (2 wheeled) Gait Pattern/deviations: Step-to pattern     General Gait Details: Pt able to ambulate with slow step to gait pattern with chair follow for safety. Pt with cues to look forward during ambulation. Complains of arm fatigue during ambulation, needed seated rest break and wheeled back to room. BP assessed at 95/53. RN informed. No complaints of dizziness with ambulation   Stairs            Wheelchair Mobility    Modified Rankin (Stroke Patients Only)       Balance Overall balance assessment: Needs assistance Sitting-balance support: Feet supported;Bilateral upper extremity supported Sitting balance-Leahy Scale: Good     Standing balance support: Bilateral upper extremity supported Standing balance-Leahy Scale: Fair                              Cognition Arousal/Alertness: Awake/alert Behavior During Therapy: WFL for tasks assessed/performed Overall Cognitive Status: Within Functional Limits for tasks assessed                                        Exercises Other Exercises Other Exercises: Seated ther-ex performed x 10 reps on R LE including ankle pumps, quad sets, glut sets, LAQ, and hip abd/add. ALl, ther-ex performed with min assist Other Exercises: Pt ambulated to [facility] for BM needing min assist for transfer and cga for hygiene.     General Comments        Pertinent Vitals/Pain Pain  Assessment: 0-10 Pain Score: 2  Pain Location: Right Hip Pain Descriptors / Indicators: Operative site guarding Pain Intervention(s): Limited activity within patient's tolerance    Home Living Family/patient expects to be discharged to:: Private residence Living Arrangements: Spouse/significant other Available Help at Discharge: Family Type of Home: House Home Access: Stairs to enter   Home Layout: Multi-level Home Equipment: Environmental consultant - 4 wheels;Cane - single point      Prior Function Level of  Independence: Needs assistance      Comments: Pt was limited household ambulator with rollater, limited by pain.   PT Goals (current goals can now be found in the care plan section) Acute Rehab PT Goals Patient Stated Goal: To return home PT Goal Formulation: With patient Time For Goal Achievement: 08/24/16 Potential to Achieve Goals: Good Additional Goals Additional Goal #1: Pt will be able to perform bed mobility/transfers with cga and safe technique in order to improve functional mobility Progress towards PT goals: Progressing toward goals    Frequency    BID      PT Plan Current plan remains appropriate    Co-evaluation             End of Session Equipment Utilized During Treatment: Gait belt Activity Tolerance: Patient limited by fatigue Patient left: in bed;with bed alarm set Nurse Communication: Mobility status PT Visit Diagnosis: Unsteadiness on feet (R26.81);Muscle weakness (generalized) (M62.81);Pain Pain - Right/Left: Right Pain - part of body: Hip     Time: 1348-1430 PT Time Calculation (min) (ACUTE ONLY): 42 min  Charges:  $Gait Training: 8-22 mins $Therapeutic Exercise: 8-22 mins $Therapeutic Activity: 8-22 mins                    G Codes:       Brittany Benton, PT, DPT [phone]    Brittany Benton 08/10/2016, 4:03 PM

## 2016-08-10 NOTE — Progress Notes (Signed)
Pt. Request not to be woke up for midnight vital signs.

## 2016-08-10 NOTE — Progress Notes (Signed)
Subjective: 2 Days Post-Op Procedure(s) (LRB): TOTAL HIP ARTHROPLASTY (Right)    Patient reports pain as mild.  Doing much better today.  Blood pressures have been stable.  Hemoglobin is 8.9.  After hydration.  She started PT.  We will let her go home tomorrow with home health PT for a week.  She is to see me in my office next Wednesday.  Objective:   VITALS:   Vitals:   08/10/16 1100 08/10/16 1114  BP: 122/70 116/90  Pulse: (!) 118   Resp:    Temp:      Neurologically intact ABD soft Neurovascular intact Sensation intact distally Intact pulses distally Dorsiflexion/Plantar flexion intact Incision: no drainage  LABS  Recent Labs  08/08/16 1423 08/09/16 0531 08/09/16 1230 08/10/16 0547  HGB 12.3 11.7* 9.7* 8.9*  HCT 36.7 34.5* 28.7* 26.8*  WBC 12.4* 11.2*  --  9.2  PLT 225 241  --  176     Recent Labs  08/08/16 1423 08/09/16 0531  NA  --  138  K  --  4.1  BUN  --  18  CREATININE 0.83 0.76  GLUCOSE  --  115*    No results for input(s): LABPT, INR in the last 72 hours.   Assessment/Plan: 2 Days Post-Op Procedure(s) (LRB): TOTAL HIP ARTHROPLASTY (Right)   Advance diet Up with therapy D/C IV fluids Discharge home with home health

## 2016-08-10 NOTE — Care Management (Signed)
Walker and bsc delivered.

## 2016-08-10 NOTE — Evaluation (Signed)
Physical Therapy Evaluation Patient Details Name: Brittany Benton MRN: [id number] DOB: 22-Oct-1955 Today's Date: 08/10/2016   History of Present Illness  Pt. is a 61 y.o. female who was admitted to [facility] for an elective Right Total Hip replacement. Pt. PMHx includes: Osteoarthritis, HTN, GERD, Renal Cell Carcinoma, Anxiety, Obstructive Sleep Apnea, Hypertensive.   Clinical Impression  Pt is a pleasant 61 year old female who was admitted for R THR. Pt performs bed mobility/transfers with min assist and ambulation with cga and RW. Pt is very motivated to perform therapy. Secondary to BP issues, seated BP assessed at 122/70 with no reports of dizziness. Pt demonstrates deficits with strength/mobility/pain. Would benefit from skilled PT to address above deficits and promote optimal return to PLOF. Recommend transition to [place] upon discharge from acute hospitalization.        Follow Up Recommendations (P) Home health PT    Equipment Recommendations  Rolling walker with 5" wheels;3in1 (PT)    Recommendations for Other Services       Precautions / Restrictions Precautions Precautions: Posterior Hip Precaution Booklet Issued: No Restrictions Weight Bearing Restrictions: Yes RLE Weight Bearing: Partial weight bearing      Mobility  Bed Mobility Overal bed mobility: Needs Assistance Bed Mobility: Supine to Sit     Supine to sit: Min assist     General bed mobility comments: assist for sliding B LE off bed and cues for sequencing. Once seated at EOB, pt with slight post trunk leaning, however is correctable with verbal/tactile cues  Transfers Overall transfer level: Needs assistance Equipment used: Rolling walker (2 wheeled) Transfers: Sit to/from Stand Sit to Stand: Min assist         General transfer comment: cues for sequencing and maintaining correct WBing status. Once upright, pt with no reports of dizziness. BP at 122/70 with static  sitting  Ambulation/Gait Ambulation/Gait assistance: Min guard Ambulation Distance (Feet): 3 Feet Assistive device: Rolling walker (2 wheeled) Gait Pattern/deviations: Step-to pattern     General Gait Details: needs cues for sequencing and moving towards recliner including maintaining hip precautions.  Stairs            Wheelchair Mobility    Modified Rankin (Stroke Patients Only)       Balance Overall balance assessment: Needs assistance Sitting-balance support: Feet supported;Bilateral upper extremity supported Sitting balance-Leahy Scale: Good     Standing balance support: Bilateral upper extremity supported Standing balance-Leahy Scale: Fair                               Pertinent Vitals/Pain Pain Assessment: 0-10 Pain Score: 1  Pain Location: Right Hip Pain Descriptors / Indicators: Aching Pain Intervention(s): Limited activity within patient's tolerance;Repositioned    Home Living Family/patient expects to be discharged to:: Private residence Living Arrangements: Spouse/significant other Available Help at Discharge: Family Type of Home: House Home Access: Stairs to enter   [employer] of Steps: 4 stairs to enter with B rails followed by 4 steps with R rail Home Layout: Multi-level Home Equipment: Walker - 4 wheels;Cane - single point      Prior Function Level of Independence: Needs assistance      ADL's / Homemaking Assistance Needed: Pt. required assist from husband for LE ADL tasks, independent with IADLs, limited driving, and limited meal prep since bariatric surgery.  Comments: Pt was limited household ambulator with rollater, limited by pain.     Hand Dominance   Dominant  Hand: Right    Extremity/Trunk Assessment   Upper Extremity Assessment Upper Extremity Assessment: Overall WFL for tasks assessed    Lower Extremity Assessment Lower Extremity Assessment: Generalized weakness (R LE grossly 3/5)        Communication   Communication: No difficulties  Cognition Arousal/Alertness: Awake/alert Behavior During Therapy: WFL for tasks assessed/performed Overall Cognitive Status: Within Functional Limits for tasks assessed                                        General Comments      Exercises Other Exercises Other Exercises: supine ther-ex performed x 10 reps on R LE including ankle pump, quad sets, hip abd/add, glut sets, and SAQ. All ther-ex performed x 10 reps with min assist and cues for technique.   Assessment/Plan    PT Assessment Patient needs continued PT services  PT Problem List Decreased strength;Decreased activity tolerance;Decreased balance;Decreased mobility;Pain       PT Treatment Interventions Gait training;Therapeutic activities;Therapeutic exercise;Stair training    PT Goals (Current goals can be found in the Care Plan section)  Acute Rehab PT Goals Patient Stated Goal: To return home PT Goal Formulation: With patient Time For Goal Achievement: 08/24/16 Potential to Achieve Goals: Good    Frequency BID   Barriers to discharge        Co-evaluation               End of Session Equipment Utilized During Treatment: Gait belt Activity Tolerance: Patient tolerated treatment well Patient left: in chair;with chair alarm set Nurse Communication: Mobility status PT Visit Diagnosis: Unsteadiness on feet (R26.81);Muscle weakness (generalized) (M62.81);Pain Pain - Right/Left: Right Pain - part of body: Hip    Time: 1030-1108 PT Time Calculation (min) (ACUTE ONLY): 38 min   Charges:   PT Evaluation $PT Eval Moderate Complexity: 1 Procedure PT Treatments $Therapeutic Exercise: 8-22 mins   PT G Codes:        Greggory Stallion, PT, DPT [phone]   Jonee Lamore 08/10/2016, 1:19 PM

## 2016-08-10 NOTE — Evaluation (Signed)
Occupational Therapy Evaluation Patient Details Name: Brittany Benton MRN: [id number] DOB: 28-May-1955 Today's Date: 08/10/2016    History of Present Illness Pt. is a 61 y.o. female who was admitted to [facility] for an elective Right Total Hip replacement. Pt. PMHx includes: Osteoarthritis, HTN, GERD, Renal Cell Carcinoma, Anxiety, Obstructive Sleep Apnea, Hypertensive.    Clinical Impression   Pt. Is a 61 y.o. female who was admitted to [facility] for an elective Right Total Hip Replacement. Pt. presents with pain, posterior hip precautions, and impaired functional mobility which hinder her ability to complete ADL, and IADL functioning. Pt. BP 119/60 during session. Pt. Had episodes of low BP, and was on bedrest the previous day. Pt. declined attempts for OOB activity, or sitting at EOB. Pt. could benefit from skilled OT services to work for ADL training, A/E training, UE there. Ex, there. Act., and to provide pt. Education about home modification, and DME in order to return to PLOF at her previous living envronment. Pt. Plans to return home with husband upon discharge.    Follow Up Recommendations  Home health OT    Equipment Recommendations       Recommendations for Other Services       Precautions / Restrictions Precautions Precautions: Posterior Hip Restrictions Weight Bearing Restrictions: Yes RLE Weight Bearing: Partial weight bearing                                                    ADL either performed or assessed with clinical judgement   ADL Overall ADL's : Needs assistance/impaired Eating/Feeding: Set up   Grooming: Set up               Lower Body Dressing: Maximal assistance                 General ADL Comments: Pt. education was provided about A/E use for LE dressing skills, and posterior hip precautions.     Vision         Perception     Praxis      Pertinent Vitals/Pain Pain Assessment: 0-10 Pain Score: 5  Pain Location: Right  Hip Pain Descriptors / Indicators: Aching     Hand Dominance Right   Extremity/Trunk Assessment Upper Extremity Assessment Upper Extremity Assessment: Overall WFL for tasks assessed           Communication Communication Communication: No difficulties   Cognition Arousal/Alertness: Awake/alert Behavior During Therapy: WFL for tasks assessed/performed Overall Cognitive Status: Within Functional Limits for tasks assessed                                     General Comments       Exercises     Shoulder Instructions      Home Living Family/patient expects to be discharged to:: Private residence Living Arrangements: Spouse/significant other Available Help at Discharge: Family Type of Home: House Home Access: Stairs to enter [employer] of Steps: 2 steps to enter, 4 steps once in the house to bedroom, bathroom, and den, which pt. plans to spend most of the time.   Home Layout: Multi-level     Bathroom Shower/Tub: Walk-in shower;Door         Home Equipment: Hand held shower head;Walker - 4 wheels;Cane - single  point          Prior Functioning/Environment Level of Independence: Needs assistance    ADL's / Homemaking Assistance Needed: Pt. required assist from husband for LE ADL tasks, independent with IADLs, limited driving, and limited meal prep since bariatric surgery.            OT Problem List: Decreased strength;Decreased activity tolerance;Pain;Decreased knowledge of use of DME or AE      OT Treatment/Interventions: Therapeutic exercise;Energy conservation;DME and/or AE instruction;Therapeutic activities;Patient/family education;Visual/perceptual remediation/compensation    OT Goals(Current goals can be found in the care plan section) Acute Rehab OT Goals Patient Stated Goal: To return home OT Goal Formulation: With patient Potential to Achieve Goals: Good  OT Frequency: Min 2X/week   Barriers to D/C:             Co-evaluation              End of Session    Activity Tolerance: Patient tolerated treatment well Patient left: in bed;with bed alarm set;with call [place]/phone within reach  OT Visit Diagnosis: Muscle weakness (generalized) (M62.81)                Time: 7782-4235 OT Time Calculation (min): 31 min Charges:  OT General Charges $OT Visit: 1 Procedure OT Evaluation $OT Eval Low Complexity: 1 Procedure G-Codes:    Harrel Carina, MS, OTR/L Harrel Carina, MS, OTR/L 08/10/2016, 11:36 AM

## 2016-08-11 LAB — HEMOGLOBIN AND HEMATOCRIT, BLOOD
HCT: 27.5 % — ABNORMAL LOW (ref 35.0–47.0)
Hemoglobin: 9.3 g/dL — ABNORMAL LOW (ref 12.0–16.0)

## 2016-08-11 LAB — CBC
HEMATOCRIT: 22.8 % — AB (ref 35.0–47.0)
Hemoglobin: 7.8 g/dL — ABNORMAL LOW (ref 12.0–16.0)
MCH: 28.5 pg (ref 26.0–34.0)
MCHC: 34.2 g/dL (ref 32.0–36.0)
MCV: 83.2 fL (ref 80.0–100.0)
Platelets: 176 10*3/uL (ref 150–440)
RBC: 2.74 MIL/uL — ABNORMAL LOW (ref 3.80–5.20)
RDW: 14.6 % — ABNORMAL HIGH (ref 11.5–14.5)
WBC: 7.7 10*3/uL (ref 3.6–11.0)

## 2016-08-11 LAB — PREPARE RBC (CROSSMATCH)

## 2016-08-11 MED ORDER — SODIUM CHLORIDE 0.9 % IV SOLN
Freq: Once | INTRAVENOUS | Status: AC
  Administered 2016-08-11: 13:00:00 via INTRAVENOUS

## 2016-08-11 MED ORDER — DIPHENHYDRAMINE HCL 25 MG PO CAPS
25.0000 mg | ORAL_CAPSULE | Freq: Once | ORAL | Status: AC
  Administered 2016-08-11: 25 mg via ORAL
  Filled 2016-08-11: qty 1

## 2016-08-11 MED ORDER — ACETAMINOPHEN 325 MG PO TABS
650.0000 mg | ORAL_TABLET | Freq: Once | ORAL | Status: AC
  Administered 2016-08-11: 650 mg via ORAL
  Filled 2016-08-11: qty 2

## 2016-08-11 NOTE — Care Management Note (Signed)
Case Management Note  Patient Details  Name: Brittany Benton MRN: [id number] Date of Birth: October 22, 1955  Subjective/Objective:       Referral for HH-PT called to [facility] at [facility]. Bundle manager from [facility] will provide DME.            Action/Plan:   Expected Discharge Date:  08/11/16               Expected Discharge Plan:  OP Rehab  In-House Referral:     Discharge planning Services  CM Consult  Post Acute Care Choice:  Home Health Choice offered to:  Patient  DME Arranged:    DME Agency:     HH Arranged:  PT [place]:  [place]  Status of Service:  In process, will continue to follow  If discussed at Long Length of Stay Meetings, dates discussed:    Additional Comments:  [name] A, RN 08/11/2016, 11:16 AM

## 2016-08-11 NOTE — Progress Notes (Signed)
  Subjective:  POD #3 s/p right THA.  Patient is dressed and packed to go home.  She is sitting up in a chair and states she has no pain.  She walked around the nurses station today and practiced the stairs successfully.  She is ready to go home.    Objective:   VITALS:   Vitals:   08/10/16 1702 08/10/16 1930 08/11/16 0021 08/11/16 0749  BP: (!) 111/53 (!) 132/54 (!) 105/56 (!) 124/57  Pulse: (!) 112 (!) 110 (!) 113 (!) 112  Resp:  18 16 18   Temp:  99.5 F (37.5 C) 97.8 F (36.6 C) 98.9 F (37.2 C)  TempSrc:  Oral Oral Oral  SpO2: 100% 96% 94% 98%  Weight:      Height:        PHYSICAL EXAM: Right lower extremity: Neurovascular intact Sensation intact distally Intact pulses distally Dorsiflexion/Plantar flexion intact Compartment soft  LABS  Results for orders placed or performed during the hospital encounter of 08/08/16 (from the past 24 hour(s))  CBC     Status: Abnormal   Collection Time: 08/11/16  4:09 AM  Result Value Ref Range   WBC 7.7 3.6 - 11.0 K/uL   RBC 2.74 (L) 3.80 - 5.20 MIL/uL   Hemoglobin 7.8 (L) 12.0 - 16.0 g/dL   HCT 22.8 (L) 35.0 - 47.0 %   MCV 83.2 80.0 - 100.0 fL   MCH 28.5 26.0 - 34.0 pg   MCHC 34.2 32.0 - 36.0 g/dL   RDW 14.6 (H) 11.5 - 14.5 %   Platelets 176 150 - 440 K/uL    No results found.  Assessment/Plan: 3 Days Post-Op   Active Problems:   Status post total hip replacement, right  Patient doing well post-op.  She is going home on ECASA for DVT prophylaxis and TED stockings.   Despite having a previous gastric bypass, ECASA use has been approved by her bariatric surgeon Dr. Duke Salvia per the patient.   She will follow up with Dr. Earnestine Leys within 2 weeks.    Thornton Park , MD 08/11/2016, 11:27 AM

## 2016-08-11 NOTE — Progress Notes (Signed)
PRBC transfusion running. Started at 1310. STAT H/H ordered for 530pm to recheck levels after infusion.  Loleta Dicker, RN

## 2016-08-11 NOTE — Progress Notes (Signed)
OT Cancellation Note  Patient Details Name: Brittany Benton MRN: [id number] DOB: 08/22/1955   Cancelled Treatment:     Patient reports she is getting ready to leave and did not feel the need for OT this am.  Verbally reviewed adaptive equipment and precautions and where she can purchase equipment locally.  Husband and sister in the room who will be assisting patient upon discharge.  No further OT needs at this time.   Amy T Lovett, OTR/L, CLT   Lovett,Amy 08/11/2016, 9:26 AM

## 2016-08-11 NOTE — Progress Notes (Signed)
@   1750, H/H returned at 9.3 and 27.5. Results read to Dr. Mack Guise. He also asked about her vitals. Last vitals done at 1626 were 123/56 and HR 102. Per Dr. Mack Guise, if pt feels ok, ok for d/c home.  Order for d/c home placed. Pt d/c home w/ husband via w/c. IV d/c'd. AVS discussed w/ pt, rx's given to pt. Pt voices understanding.

## 2016-08-11 NOTE — Progress Notes (Signed)
I have discussed with the patient that her hemoglobin is 7.8 today.  She is tachycardic.  Therefore I discussed with the patient about a blood transfusion and she would like to proceed.  Patient will have a CBC checked after her transfusion.  If the hemoglobin improves, she may go home this evening.

## 2016-08-11 NOTE — Progress Notes (Signed)
Physical Therapy Treatment Patient Details Name: Brittany Benton MRN: [id number] DOB: 10/22/1955 Today's Date: 08/11/2016    History of Present Illness Pt. is a 61 y.o. female who was admitted to [facility] for an elective Right Total Hip replacement. Pt. PMHx includes: Osteoarthritis, HTN, GERD, Renal Cell Carcinoma, Anxiety, Obstructive Sleep Apnea, Hypertensive.     PT Comments    Bed mobility improved today and was able to do without assist but with increased time.  Stood and was able to ambulate 15' and 54' with walker and step-to pattern PWB R.  Practiced up/down 4 steps with bilateral rails and overall steady gait with min guard.  Pt reported it was easier now than before her surgery and with no pain.  She has 4 steps with B rails then 4 steps with right rail only but has chair molding that she uses for support. Pt shown options for up.down with 1 rail but declined to trial stating she was comfortable.  Family in attendance and agreed.  Participated in exercises as described below.  Reviewed precautions and answered pt/family questions as appropriate.   Follow Up Recommendations  Home health PT     Equipment Recommendations  Rolling walker with 5" wheels;3in1 (PT)    Recommendations for Other Services       Precautions / Restrictions Precautions Precautions: Posterior Hip Precaution Booklet Issued: Yes (comment) Restrictions Weight Bearing Restrictions: Yes RLE Weight Bearing: Partial weight bearing    Mobility  Bed Mobility Overal bed mobility: Needs Assistance Bed Mobility: Sit to Supine     Supine to sit: Supervision     General bed mobility comments: able to do without assist today  Transfers Overall transfer level: Needs assistance Equipment used: Rolling walker (2 wheeled) Transfers: Sit to/from Stand              Ambulation/Gait Ambulation/Gait assistance: Supervision Ambulation Distance (Feet): 60 Feet Assistive device: Rolling walker (2 wheeled) Gait  Pattern/deviations: Step-to pattern   Gait velocity interpretation: <1.8 ft/sec, indicative of risk for recurrent falls General Gait Details: 45' 60' generally steady   Stairs Stairs: Yes   Stair Management: Two rails      Wheelchair Mobility    Modified Rankin (Stroke Patients Only)       Balance Overall balance assessment: Needs assistance Sitting-balance support: Feet supported;Bilateral upper extremity supported       Standing balance support: Bilateral upper extremity supported Standing balance-Leahy Scale: Good                              Cognition Arousal/Alertness: Awake/alert Behavior During Therapy: WFL for tasks assessed/performed Overall Cognitive Status: Within Functional Limits for tasks assessed                                        Exercises Other Exercises Other Exercises: supine Ab/add, slr and heel slides x 10, glut and quad sets and ankle pumps independantly    General Comments        Pertinent Vitals/Pain Pain Assessment: No/denies pain Pain Location: Right Hip Pain Descriptors / Indicators: Operative site guarding Pain Intervention(s): Limited activity within patient's tolerance    Home Living Family/patient expects to be discharged to:: Private residence Living Arrangements: Spouse/significant other                  Prior Function  PT Goals (current goals can now be found in the care plan section) Progress towards PT goals: Progressing toward goals    Frequency    BID      PT Plan Current plan remains appropriate    Co-evaluation             End of Session Equipment Utilized During Treatment: Gait belt Activity Tolerance: Patient limited by fatigue Patient left: in chair;with call Reiley/phone within reach;with family/visitor present Nurse Communication: Other (comment) Pain - Right/Left: Right Pain - part of body: Hip     Time: 7893-8101 PT Time Calculation  (min) (ACUTE ONLY): 31 min  Charges:  $Gait Training: 8-22 mins $Therapeutic Exercise: 8-22 mins                    G Codes:       Chesley Noon, PTA 08/11/16, 10:33 AM

## 2016-08-12 LAB — TYPE AND SCREEN
ABO/RH(D): A POS
ANTIBODY SCREEN: NEGATIVE
UNIT DIVISION: 0

## 2016-08-12 LAB — BPAM RBC
Blood Product Expiration Date: 201805072359
ISSUE DATE / TIME: 201804281247
UNIT TYPE AND RH: 6200

## 2016-08-24 ENCOUNTER — Other Ambulatory Visit: Payer: Self-pay | Admitting: Family Medicine

## 2016-08-28 ENCOUNTER — Encounter: Payer: Self-pay | Admitting: Cardiovascular Disease

## 2016-08-28 ENCOUNTER — Ambulatory Visit (INDEPENDENT_AMBULATORY_CARE_PROVIDER_SITE_OTHER): Payer: 59 | Admitting: Cardiovascular Disease

## 2016-08-28 VITALS — BP 122/60 | HR 116 | Ht 60.0 in | Wt 154.5 lb

## 2016-08-28 DIAGNOSIS — Z96641 Presence of right artificial hip joint: Secondary | ICD-10-CM | POA: Diagnosis not present

## 2016-08-28 DIAGNOSIS — E785 Hyperlipidemia, unspecified: Secondary | ICD-10-CM | POA: Diagnosis not present

## 2016-08-28 DIAGNOSIS — R000 Tachycardia, unspecified: Secondary | ICD-10-CM

## 2016-08-28 DIAGNOSIS — Z9884 Bariatric surgery status: Secondary | ICD-10-CM | POA: Insufficient documentation

## 2016-08-28 DIAGNOSIS — I10 Essential (primary) hypertension: Secondary | ICD-10-CM

## 2016-08-28 MED ORDER — METOPROLOL SUCCINATE ER 25 MG PO TB24: 25.0000 mg | ORAL_TABLET | Freq: Every day | ORAL | 3 refills | Status: DC | PRN

## 2016-08-28 MED ORDER — METOPROLOL SUCCINATE ER 25 MG PO TB24: 25.0000 mg | ORAL_TABLET | Freq: Every day | ORAL | 3 refills | Status: DC

## 2016-08-28 NOTE — Progress Notes (Signed)
Cardiology Office Note  Date:  08/28/2016   ID:  CAMYRA VAETH, DOB December 30, 1955, MRN [id number]  PCP:  Birdie Sons, MD   Chief Complaint  Patient presents with  . other    Discuss BP medication Metoprolol D/C. Meds reviewed verbally with pt.    HPI:  Ms. Keys is a very pleasant 61 year old woman with history of  panic attacks,  borderline diabetes, HBA1C 5.1 hyperlipidemia,  obesity with osteoarthritis of her hip  who presents  bariatric surgery,  Sleeve  01/2016 CT coronary calcium score in 2017 of  0 Baseline sinus tachycardia Who presents for routine follow-up of her hypertension  Weight is down from 215 pounds on her last clinic visit in April 2017 Weight today 150 pounds  She had total hip replacement on the right and of April 2018 Date of surgery April 25 She was hypotensive and tachycardic after surgery and required fluids Secondary to blood loss, anesthesia, pain meds Blood loss anemia   Problems with anemia over the past 2 weeks hematocrit 34 down to 27 Required blood transfusion  Feels well,  She has baseline tachycardia even prior to surgery Tachycardia persisting today She is asymptomatic Walking with a walker but more active, participating in physical therapy No significant leg swelling, wearing compression hose Lipitor held Amlodipine held as blood pressure has improved She is no longer taking metoprolol, this was held after surgery  EKG personally reviewed by myself on today's visit showing sinus tachycardia rate 116 bpm no significant ST or T-wave changes  Other past medical history On cholesterol medication for 20 years  problems with panic attacks, this has not been a issue recently.    PMH:   has a past medical history of Anxiety; Arthritis; GERD (gastroesophageal reflux disease); Hernia, incisional; History of chicken pox; Hypertension; Renal cell carcinoma ([place]); and Sleep apnea.  PSH:    Past Surgical History:  Procedure Laterality  Date  . APPENDECTOMY  1994  . BREAST BIOPSY Right 1991   Negative  . CHOLECYSTECTOMY  1994  . LAPAROSCOPIC GASTRIC SLEEVE RESECTION  02/02/2016   Dr Duke Salvia at [facility]  . NEPHRECTOMY  1994   Renal Cell Carcinoma  . parotid gland removal  1990   also removed a tumor  . TONSILLECTOMY    . TOTAL HIP ARTHROPLASTY Right 08/08/2016   Procedure: TOTAL HIP ARTHROPLASTY;  Surgeon: Earnestine Leys, MD;  Location: [facility] ORS;  Service: Orthopedics;  Laterality: Right;    Current Outpatient Prescriptions  Medication Sig Dispense Refill  . Ca Phosphate-Cholecalciferol (CALCIUM 500 + D3) 250-500 MG-UNIT CHEW Chew 1 tablet by mouth daily.    Marland Kitchen gabapentin (NEURONTIN) 400 MG capsule Take 1 capsule (400 mg total) by mouth 2 (two) times daily. 60 capsule 3  . HYDROcodone-acetaminophen (NORCO) 5-325 MG tablet Take 1 tablet by mouth every 6 (six) hours as needed. 40 tablet 0  . imipramine (TOFRANIL) 50 MG tablet TAKE ONE TABLET BY MOUTH TWICE DAILY 180 tablet 4  . Multiple Vitamins-Minerals (MULTIVITAMIN ADULT PO) Take 1 tablet by mouth daily.    Marland Kitchen omeprazole (PRILOSEC) 20 MG capsule TAKE 1 CAPSULE BY MOUTH EVERY DAY AS DIRECTED 30 capsule 12  . sertraline (ZOLOFT) 50 MG tablet TAKE ONE AND A HALF TABLETS BY MOUTH EVERY DAY 135 tablet 12  . Wheat Dextrin (BENEFIBER DRINK MIX PO) Take 10 mLs by mouth 3 (three) times daily as needed.    . metoprolol succinate (TOPROL-XL) 25 MG 24 hr tablet Take 1 tablet (25  mg total) by mouth daily as needed. Take with or immediately following a meal. 90 tablet 3   No current facility-administered medications for this visit.      Allergies:   Codeine; Morphine; Tape; and Tramadol   Social History:  The patient  reports that she has never smoked. She has never used smokeless tobacco. She reports that she drinks alcohol. She reports that she does not use drugs.   Family History:   family history includes Breast cancer in her maternal aunt; Dementia in her mother; Heart attack  in her father; Heart disease in her father; Osteoporosis in her mother.    Review of Systems: Review of Systems  Constitutional: Positive for weight loss.  Respiratory: Negative.   Cardiovascular: Negative.   Gastrointestinal: Negative.   Musculoskeletal: Negative.   Neurological: Negative.   Psychiatric/Behavioral: Negative.   All other systems reviewed and are negative.    PHYSICAL EXAM: VS:  BP 122/60 (BP Location: Right Arm, Patient Position: Sitting, Cuff Size: Normal)   Pulse (!) 116   Ht 5' (1.524 m)   Wt 154 lb 8 oz (70.1 kg)   BMI 30.17 kg/m  , BMI Body mass index is 30.17 kg/m. GEN: Well nourished, well developed, in no acute distress , walking with a walker, no distress HEENT: normal  Neck: no JVD, carotid bruits, or masses Cardiac: Regular rhythm, tachycardic,; no murmurs, rubs, or gallops,no edema  Respiratory:  clear to auscultation bilaterally, normal work of breathing GI: soft, nontender, nondistended, + BS MS: no deformity or atrophy , compression hose in place Skin: warm and dry, no rash Neuro:  Strength and sensation are intact Psych: euthymic mood, full affect    Recent Labs: 06/25/2016: ALT 21 08/09/2016: BUN 18; Creatinine, Ser 0.76; Potassium 4.1; Sodium 138 08/11/2016: Hemoglobin 9.3; Platelets 176    Lipid Panel Lab Results  Component Value Date   CHOL 212 (H) 03/30/2016   HDL 39 (L) 03/30/2016   LDLCALC 141 (H) 03/30/2016   TRIG 159 (H) 03/30/2016      Wt Readings from Last 3 Encounters:  08/28/16 154 lb 8 oz (70.1 kg)  08/08/16 156 lb (70.8 kg)  07/25/16 156 lb (70.8 kg)       ASSESSMENT AND PLAN:  Essential (primary) hypertension - Plan: EKG 12-Lead Blood pressure improved We'll restart low-dose metoprolol only as tolerated for tachycardia  Hyperlipidemia, unspecified hyperlipidemia type - Plan: EKG 12-Lead She is no longer taking Lipitor Recommended we wait until she has repeat lipid panel before making a decision on  whether to restart medication  Sinus tachycardia - Plan: EKG 12-Lead Long discussion concerning her sinus tachycardia Recommended she try the metoprolol succinate one half pill in the evening with close monitoring of her blood pressure. His could be increased up to a full pill as tolerated She is relatively asymptomatic and realizes we are only treating her tachycardia not her symptoms  Status post total hip replacement, right She has question on her aspirin 325 mg twice a day postoperatively She has placed a call to Dr. Sabra Heck to see how long she needs to stay on this As she has anemia,  We suggested that we will research other options for her.  Potentially she could take low-dose Xarelto 10 mg typically taken for the first 35 days  Alternatively she could take eliquis 2.5 mg twice a day for the first 35 days  H/O gastric bypass 70 pound weight loss over the past year since her bypass surgery  Disposition:  F/U  12 months   Total encounter time more than 25 minutes  Greater than 50% was spent in counseling and coordination of care with the patient    Orders Placed This Encounter  Procedures  . EKG 12-Lead     Signed, Esmond Plants, M.D., Ph.D. 08/28/2016  [place], [place]

## 2016-08-28 NOTE — Patient Instructions (Signed)
Medication Instructions:   No medication changes made  Please take 1/2 pill up to whole pill as needed for tachycardia  Labwork:  No new labs needed  Testing/Procedures:  No further testing at this time   I recommend watching educational videos on topics of interest to you at:       [email or web address]  Enter code: [email or web address]    Follow-Up: It was a pleasure seeing you in the office today. Please call us if you have new issues that need to be addressed before your next appt.  [phone]  Your physician wants you to follow-up in: 12 months.  You will receive a reminder letter in the mail two months in advance. If you don't receive a letter, please call our office to schedule the follow-up appointment.  If you need a refill on your cardiac medications before your next appointment, please call your pharmacy.

## 2016-08-29 ENCOUNTER — Telehealth: Payer: Self-pay | Admitting: *Deleted

## 2016-08-29 NOTE — Telephone Encounter (Signed)
Spoke with patient at length regarding Dr. Donivan Scull recommendations and she verbalized understanding and would like to think about this for a day before making any decisions. Her hip surgery was 08/08/16 so 35 days would be 09/11/16. She wrote down both medications and our number to call back tomorrow once she thinks on this. Sh e reports that she has not had any new symptoms taking the aspirin. She stated that she would call back tomorrow with her decision.

## 2016-08-29 NOTE — Telephone Encounter (Signed)
-----   Message from Minna Merritts, MD sent at 08/28/2016  7:59 PM EDT ----- Regarding: anticoagulation Would let patient know there is some data on 325 mg aspirin twice a day for up to 35 days after hip surgery  More compelling data is using xarelto 10 mg daily for 35 days Or eliquis 2.5 mill grams twice a day for 35 days following hip surgery  If she would like either of the last 2, we could provide samples until she has reached 35 days  TG

## 2016-08-31 ENCOUNTER — Ambulatory Visit: Payer: 59 | Admitting: Family Medicine

## 2016-08-31 ENCOUNTER — Telehealth: Payer: Self-pay | Admitting: Cardiovascular Disease

## 2016-08-31 NOTE — Telephone Encounter (Signed)
Patient returning call. tx Olin Hauser

## 2016-08-31 NOTE — Telephone Encounter (Signed)
Left detailed voicemail message that I saw where she called back stating that she would like to continue the aspirin per Dr. Sabra Heck and that if she should change her mind to give [place] a call back or if she should have any further questions.

## 2016-08-31 NOTE — Telephone Encounter (Signed)
Patient stated that she is,"so grateful for the kinds words that Dr. Rockey Situ said (to her) and for the follow-up call regarding the Aspirin." Patient has decided to continue with Dr. Ammie Ferrier orders.

## 2016-08-31 NOTE — Telephone Encounter (Signed)
Left voicemail message for her to call back.  

## 2016-08-31 NOTE — Telephone Encounter (Signed)
See other telephone encounter.

## 2016-11-16 ENCOUNTER — Encounter: Payer: Self-pay | Admitting: Family Medicine

## 2017-03-04 IMAGING — MG MM DIGITAL SCREENING BILAT W/ TOMO W/ CAD
8 of 13 series · 8 of 29 positions shown · non-contrast
Comparison: Previous exam(s).

ACR Breast Density Category a: The breast tissue is almost entirely
fatty.

CLINICAL DATA: Screening.

EXAM:
2D DIGITAL SCREENING BILATERAL MAMMOGRAM WITH CAD AND ADJUNCT TOMO

[L XCCL]
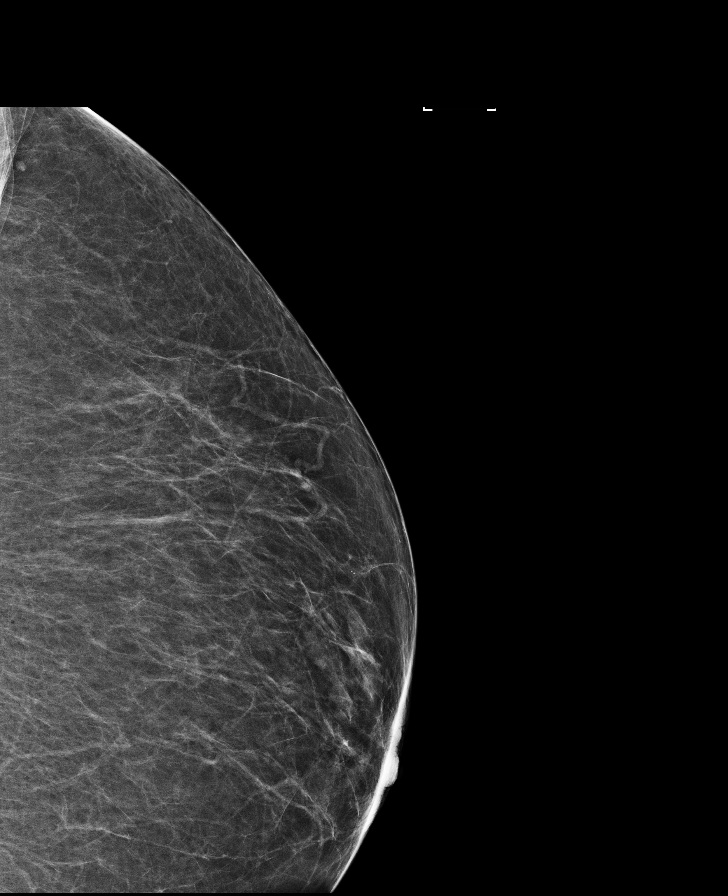

[L CC synth-2D]
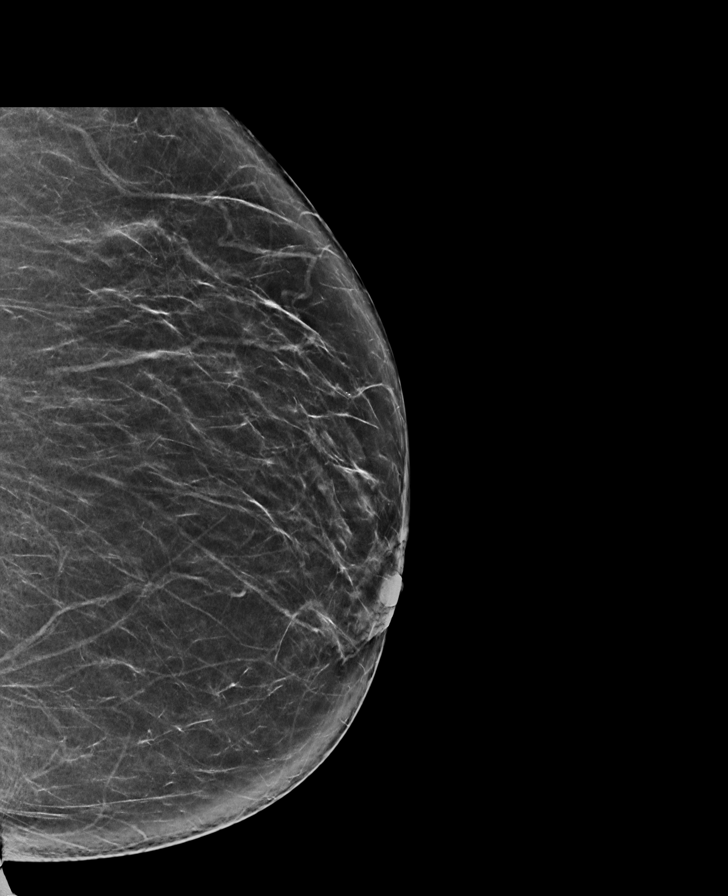

[R MLO]
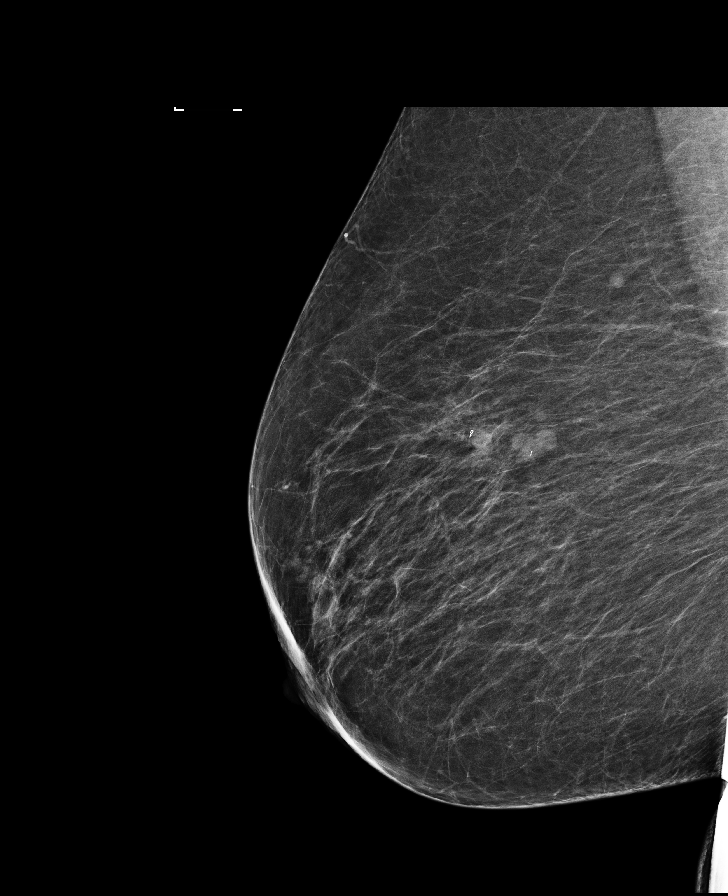

[R MLO synth-2D]
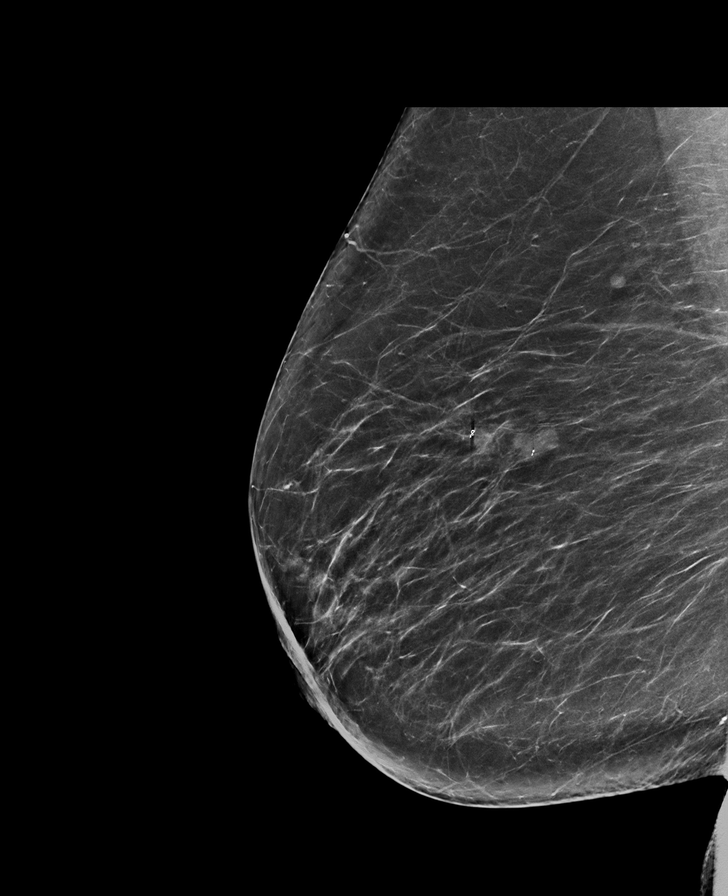

[L MLO]
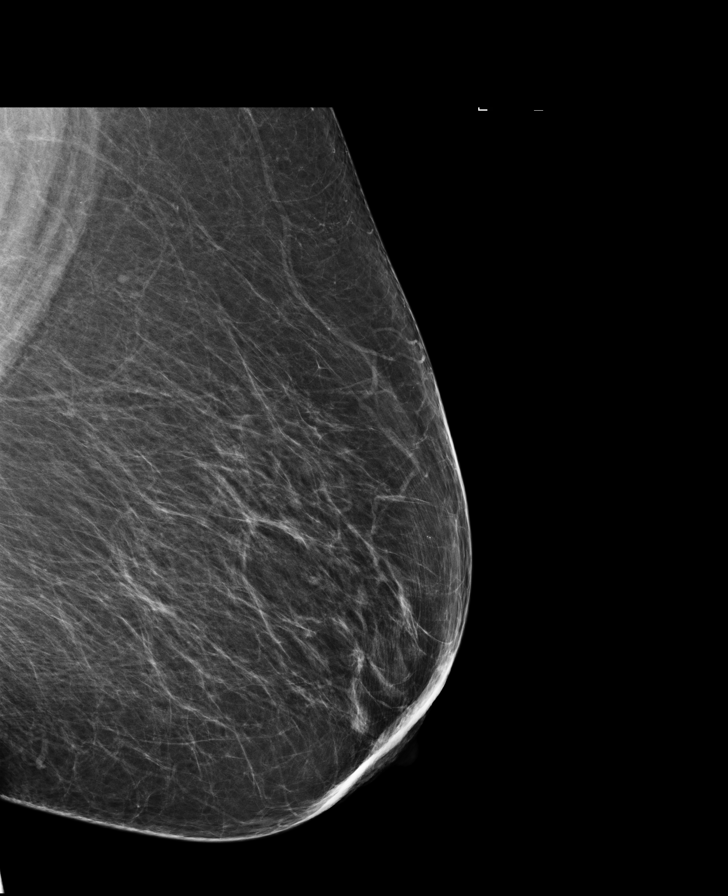

[L CC]
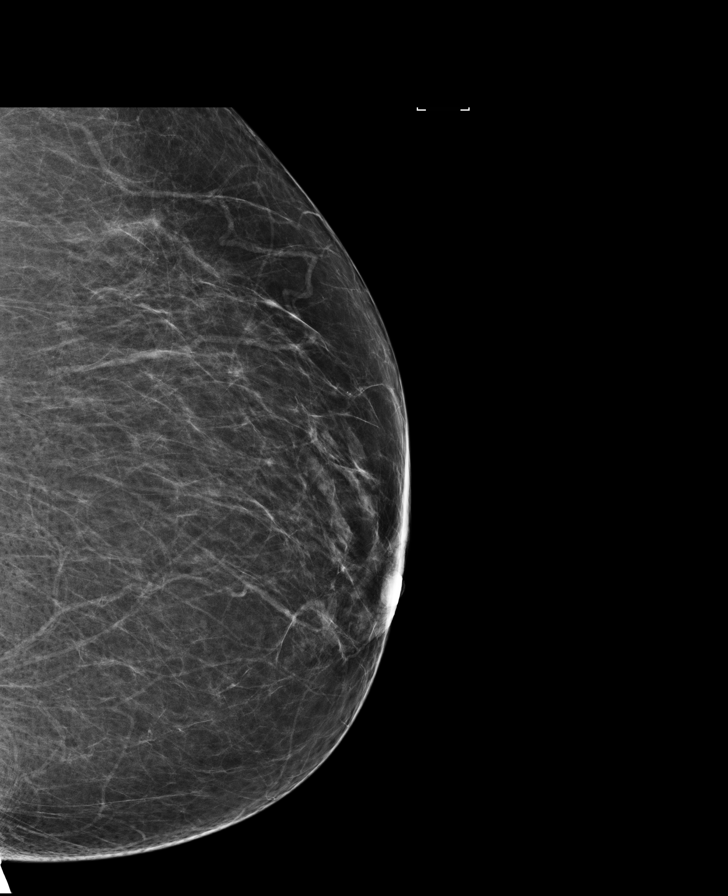

[L MLO synth-2D]
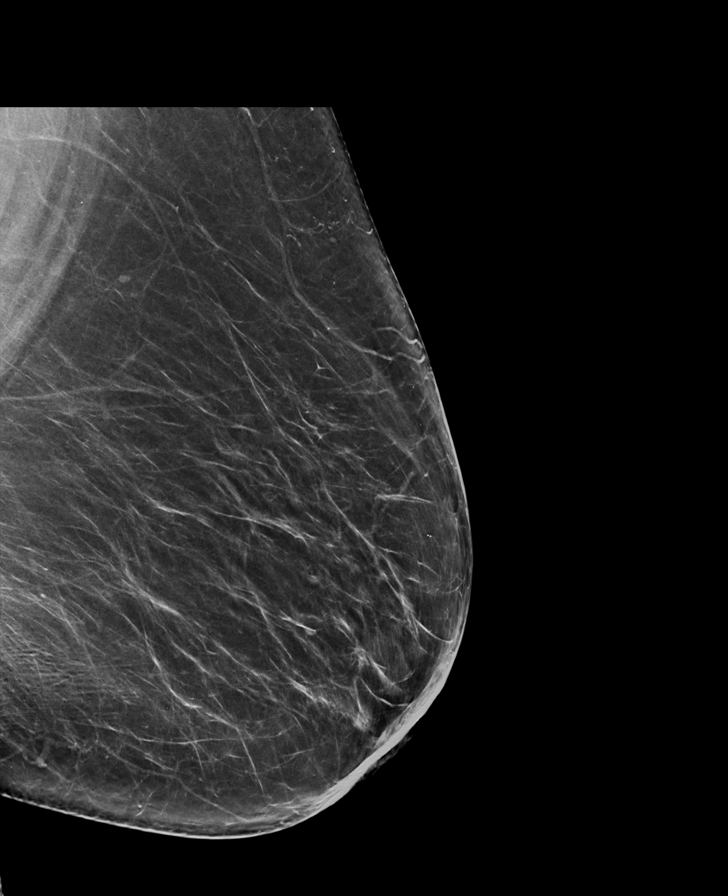

[R CC synth-2D]
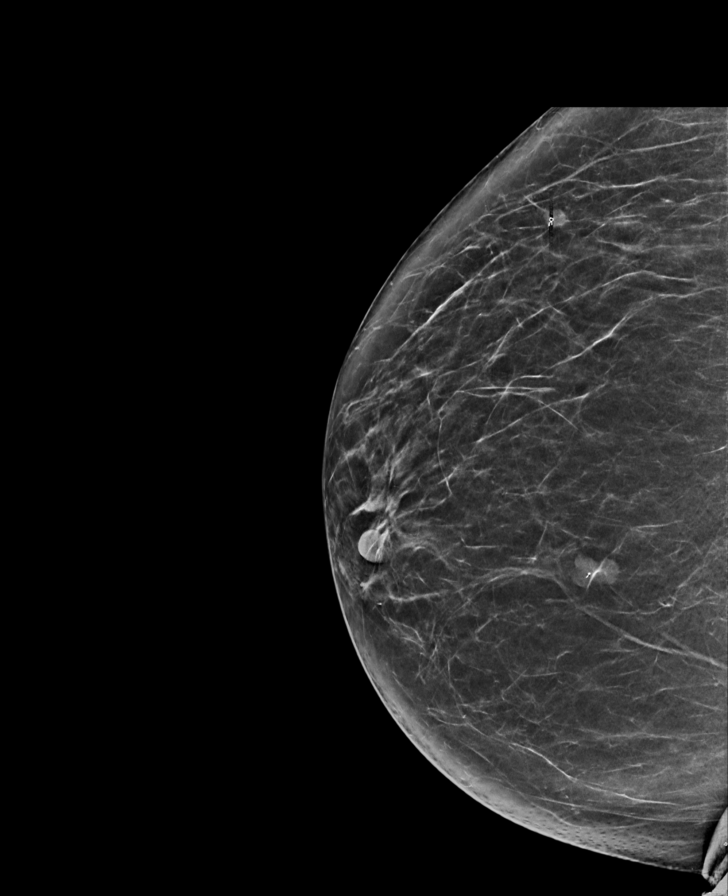

[8 of 29 positions shown; findings below may reference images not displayed]

FINDINGS: There are no findings suspicious for malignancy. Images were
processed with CAD.
IMPRESSION: No mammographic evidence of malignancy. A result letter of this
screening mammogram will be mailed directly to the patient.

RECOMMENDATION:
Screening mammogram in one year. (Code:1B-[id number])

BI-RADS CATEGORY  1: Negative.

## 2017-06-05 ENCOUNTER — Other Ambulatory Visit: Payer: Self-pay | Admitting: Family Medicine

## 2017-07-04 ENCOUNTER — Other Ambulatory Visit: Payer: Self-pay | Admitting: Family Medicine

## 2017-08-02 ENCOUNTER — Ambulatory Visit: Payer: BLUE CROSS/BLUE SHIELD | Admitting: Family Medicine

## 2017-08-02 ENCOUNTER — Encounter: Payer: Self-pay | Admitting: Family Medicine

## 2017-08-02 VITALS — BP 138/78 | HR 92 | Temp 98.7°F | Resp 16

## 2017-08-02 DIAGNOSIS — J329 Chronic sinusitis, unspecified: Secondary | ICD-10-CM

## 2017-08-02 MED ORDER — AMOXICILLIN 500 MG PO CAPS: 1000.0000 mg | ORAL_CAPSULE | Freq: Two times a day (BID) | ORAL | 0 refills | Status: DC

## 2017-08-02 NOTE — Progress Notes (Signed)
Patient: Brittany Benton Female    DOB: 1956/02/11   61 y.o.   MRN: [id number] Visit Date: 08/02/2017  Today's Provider: Lelon Huh, MD   Chief Complaint  Patient presents with  . Cough    x 3-4 weeks   Subjective:    Cough  This is a new problem. Episode onset: 3-4 weeks ago. The problem has been gradually worsening. The cough is productive of sputum (green-yellow mucus). Associated symptoms include ear pain, a fever (low grade 99), headaches, nasal congestion, postnasal drip, rhinorrhea and a sore throat. Pertinent negatives include no chest pain, chills, eye redness, hemoptysis, shortness of breath, sweats or wheezing. The symptoms are aggravated by lying down. Treatments tried: Tylenol and Claritin  The treatment provided mild relief.      Allergies  Allergen Reactions  . Codeine Nausea And Vomiting  . Morphine Hives and Swelling  . Tape Other (See Comments)    blisters  . Tramadol Other (See Comments)    Hallucinations     Current Outpatient Medications:  .  Ca Phosphate-Cholecalciferol (CALCIUM 500 + D3) 250-500 MG-UNIT CHEW, Chew 1 tablet by mouth daily., Disp: , Rfl:  .  Cholecalciferol (VITAMIN D-3) 1000 units CAPS, Take 1 capsule by mouth daily., Disp: , Rfl:  .  imipramine (TOFRANIL) 50 MG tablet, TAKE 1 TABLET BY MOUTH TWICE DAILY, Disp: 180 tablet, Rfl: 4 .  metoprolol succinate (TOPROL-XL) 25 MG 24 hr tablet, Take 1 tablet (25 mg total) by mouth daily as needed. Take with or immediately following a meal., Disp: 90 tablet, Rfl: 3 .  Multiple Vitamins-Minerals (MULTIVITAMIN ADULT PO), Take 1 tablet by mouth daily., Disp: , Rfl:  .  omeprazole (PRILOSEC) 20 MG capsule, TAKE 1 CAPSULE EVERY DAY AS DIRECTED, Disp: 30 capsule, Rfl: 12 .  sertraline (ZOLOFT) 50 MG tablet, TAKE ONE AND A HALF TABLETS BY MOUTH EVERY DAY, Disp: 135 tablet, Rfl: 12 .  gabapentin (NEURONTIN) 400 MG capsule, Take 1 capsule (400 mg total) by mouth 2 (two) times daily. (Patient not  taking: Reported on 08/02/2017), Disp: 60 capsule, Rfl: 3 .  HYDROcodone-acetaminophen (NORCO) 5-325 MG tablet, Take 1 tablet by mouth every 6 (six) hours as needed. (Patient not taking: Reported on 08/02/2017), Disp: 40 tablet, Rfl: 0 .  Wheat Dextrin (BENEFIBER DRINK MIX PO), Take 10 mLs by mouth 3 (three) times daily as needed., Disp: , Rfl:   Review of Systems  Constitutional: Positive for fever (low grade 99). Negative for appetite change, chills, diaphoresis and fatigue.  HENT: Positive for congestion, ear pain, postnasal drip, rhinorrhea and sore throat.   Eyes: Negative for photophobia, pain, discharge, redness, itching and visual disturbance.  Respiratory: Positive for cough (productive cough with green-yellow mucus). Negative for hemoptysis, chest tightness, shortness of breath and wheezing.   Cardiovascular: Negative for chest pain and palpitations.  Gastrointestinal: Negative for abdominal pain, nausea and vomiting.  Neurological: Positive for headaches. Negative for dizziness and weakness.  Psychiatric/Behavioral: Positive for sleep disturbance.    Social History   Tobacco Use  . Smoking status: Never Smoker  . Smokeless tobacco: Never Used  Substance Use Topics  . Alcohol use: Not Currently    Alcohol/week: 0.0 oz   Objective:   BP 138/78 (BP Location: Left Arm, Patient Position: Sitting, Cuff Size: Large)   Pulse 92   Temp 98.7 F (37.1 C) (Oral)   Resp 16   SpO2 98% Comment: room air    Physical Exam  General  Appearance:    Alert, cooperative, no distress  HENT:   ENT exam normal, no neck nodes. Frontal sinuses tenderness  Eyes:    PERRL, conjunctiva/corneas clear, EOM's intact       Lungs:     Clear to auscultation bilaterally, respirations unlabored  Heart:    Regular rate and rhythm  Neurologic:   Awake, alert, oriented x 3. No apparent focal neurological           defect.            Assessment & Plan:     1. Sinusitis, unspecified chronicity,  unspecified location  - amoxicillin (AMOXIL) 500 MG capsule; Take 2 capsules (1,000 mg total) by mouth 2 (two) times daily for 7 days.  Dispense: 28 capsule; Refill: 0 Call if symptoms change or if not rapidly improving.          Lelon Huh, MD  [facility]

## 2017-08-07 ENCOUNTER — Ambulatory Visit: Payer: BLUE CROSS/BLUE SHIELD | Admitting: Physician Assistant

## 2017-08-07 ENCOUNTER — Encounter: Payer: Self-pay | Admitting: Physician Assistant

## 2017-08-07 VITALS — BP 128/74 | HR 66 | Temp 98.4°F | Wt 159.0 lb

## 2017-08-07 DIAGNOSIS — R05 Cough: Secondary | ICD-10-CM

## 2017-08-07 DIAGNOSIS — J40 Bronchitis, not specified as acute or chronic: Secondary | ICD-10-CM

## 2017-08-07 DIAGNOSIS — R062 Wheezing: Secondary | ICD-10-CM | POA: Diagnosis not present

## 2017-08-07 DIAGNOSIS — R059 Cough, unspecified: Secondary | ICD-10-CM

## 2017-08-07 MED ORDER — BENZONATATE 100 MG PO CAPS: 100.0000 mg | ORAL_CAPSULE | Freq: Three times a day (TID) | ORAL | 0 refills | Status: AC | PRN

## 2017-08-07 MED ORDER — ALBUTEROL SULFATE HFA 108 (90 BASE) MCG/ACT IN AERS
2.0000 | INHALATION_SPRAY | Freq: Four times a day (QID) | RESPIRATORY_TRACT | 2 refills | Status: DC | PRN
Start: 2017-08-07 — End: 2019-05-08

## 2017-08-07 NOTE — Progress Notes (Signed)
Patient: Brittany Benton Female    DOB: [birth year]   62 y.o.   MRN: [id number] Visit Date: 08/07/2017  Today's Provider: Trinna Post, PA-C   Chief Complaint  Patient presents with  . Cough   Subjective:    Cough  This is a new problem. The current episode started more than 1 month ago. The problem has been gradually worsening. The cough is productive of sputum. Associated symptoms include nasal congestion. Associated symptoms comments: Hoarseness . Treatments tried: Patient was seen on 08/02/17 by Dr. Caryn Section for sinusitis. She was prescribed Amoxicillin 500 mg. She reports good compliance with treatment plan. She states she is still coughing. She took Mucinex with no relief.      Allergies  Allergen Reactions  . Codeine Nausea And Vomiting  . Morphine Hives and Swelling  . Tape Other (See Comments)    blisters blisters  . Tramadol Other (See Comments)    Hallucinations     Current Outpatient Medications:  .  amoxicillin (AMOXIL) 500 MG capsule, Take 2 capsules (1,000 mg total) by mouth 2 (two) times daily for 7 days., Disp: 28 capsule, Rfl: 0 .  Ca Phosphate-Cholecalciferol (CALCIUM 500 + D3) 250-500 MG-UNIT CHEW, Chew 1 tablet by mouth daily., Disp: , Rfl:  .  Cholecalciferol (VITAMIN D-3) 1000 units CAPS, Take 1 capsule by mouth daily., Disp: , Rfl:  .  imipramine (TOFRANIL) 50 MG tablet, TAKE 1 TABLET BY MOUTH TWICE DAILY, Disp: 180 tablet, Rfl: 4 .  metoprolol succinate (TOPROL-XL) 25 MG 24 hr tablet, Take 1 tablet (25 mg total) by mouth daily as needed. Take with or immediately following a meal., Disp: 90 tablet, Rfl: 3 .  Multiple Vitamins-Minerals (MULTIVITAMIN ADULT PO), Take 1 tablet by mouth daily., Disp: , Rfl:  .  omeprazole (PRILOSEC) 20 MG capsule, TAKE 1 CAPSULE EVERY DAY AS DIRECTED, Disp: 30 capsule, Rfl: 12 .  sertraline (ZOLOFT) 50 MG tablet, TAKE ONE AND A HALF TABLETS BY MOUTH EVERY DAY, Disp: 135 tablet, Rfl: 12 .  Wheat Dextrin (BENEFIBER DRINK  MIX PO), Take 10 mLs by mouth 3 (three) times daily as needed., Disp: , Rfl:    Review of Systems  Constitutional: Negative.   Respiratory: Positive for cough.   Cardiovascular: Negative.    Family History  Problem Relation Age of Onset  . Osteoporosis Mother   . Dementia Mother   . Heart disease Father   . Heart attack Father   . Breast cancer Maternal Aunt    Past Surgical History:  Procedure Laterality Date  . APPENDECTOMY  1994  . BREAST BIOPSY Right 1991   Negative  . CHOLECYSTECTOMY  1994  . LAPAROSCOPIC GASTRIC SLEEVE RESECTION  02/02/2016   Dr Duke Salvia at [facility]  . NEPHRECTOMY  1994   Renal Cell Carcinoma  . parotid gland removal  1990   also removed a tumor  . TONSILLECTOMY    . TOTAL HIP ARTHROPLASTY Right 08/08/2016   Procedure: TOTAL HIP ARTHROPLASTY;  Surgeon: Earnestine Leys, MD;  Location: [facility] ORS;  Service: Orthopedics;  Laterality: Right;    Social History   Tobacco Use  . Smoking status: Never Smoker  . Smokeless tobacco: Never Used  Substance Use Topics  . Alcohol use: Not Currently    Alcohol/week: 0.0 oz   Objective:   BP 128/74 (BP Location: Right Arm, Patient Position: Sitting, Cuff Size: Normal)   Pulse 66   Temp 98.4 F (36.9 C) (Oral)  Wt 159 lb (72.1 kg)   SpO2 98%   BMI 31.05 kg/m    Physical Exam  Constitutional: She is oriented to person, place, and time. She appears well-developed and well-nourished.  Cardiovascular: Normal rate and regular rhythm.  Pulmonary/Chest: She has wheezes.  Neurological: She is alert and oriented to person, place, and time.  Skin: Skin is warm and dry.  Psychiatric: She has a normal mood and affect. Her behavior is normal.        Assessment & Plan:     1. Bronchitis  - albuterol (PROVENTIL HFA;VENTOLIN HFA) 108 (90 Base) MCG/ACT inhaler; Inhale 2 puffs into the lungs every 6 (six) hours as needed for wheezing or shortness of breath.  Dispense: 1 Inhaler; Refill: 2 - benzonatate (TESSALON  PERLES) 100 MG capsule; Take 1 capsule (100 mg total) by mouth 3 (three) times daily as needed for up to 7 days.  Dispense: 21 capsule; Refill: 0  2. Wheezing  - albuterol (PROVENTIL HFA;VENTOLIN HFA) 108 (90 Base) MCG/ACT inhaler; Inhale 2 puffs into the lungs every 6 (six) hours as needed for wheezing or shortness of breath.  Dispense: 1 Inhaler; Refill: 2  3. Cough  - benzonatate (TESSALON PERLES) 100 MG capsule; Take 1 capsule (100 mg total) by mouth 3 (three) times daily as needed for up to 7 days.  Dispense: 21 capsule; Refill: 0  Return if symptoms worsen or fail to improve.  The entirety of the information documented in the History of Present Illness, Review of Systems and Physical Exam were personally obtained by me. Portions of this information were initially documented by Wilburt Finlay, CMA and reviewed by me for thoroughness and accuracy.           Trinna Post, PA-C  [facility]

## 2017-08-07 NOTE — Patient Instructions (Signed)

## 2017-08-09 ENCOUNTER — Telehealth: Payer: Self-pay | Admitting: Family Medicine

## 2017-08-09 DIAGNOSIS — J40 Bronchitis, not specified as acute or chronic: Secondary | ICD-10-CM

## 2017-08-09 MED ORDER — PREDNISONE 20 MG PO TABS: 40.0000 mg | ORAL_TABLET | Freq: Every day | ORAL | 0 refills | Status: AC

## 2017-08-09 NOTE — Telephone Encounter (Signed)
Pt was in recently and seen Dr. Caryn Section for a cough.  She has taken the antibiotic and the pearls for cough but is still having a lot of congestion and cough.  Please advise [phone]  Thanks Con Memos

## 2017-08-09 NOTE — Telephone Encounter (Signed)
40 mg prednisone daily x 5 days for SOB and tightness. Follow up if worsening.

## 2017-08-09 NOTE — Telephone Encounter (Signed)
Pt advised.

## 2017-08-09 NOTE — Telephone Encounter (Signed)
2nd time calling today.  Patient has finished amoxicillin but is still having tightness and problems with breathing.  Very congested and has a severe cough.

## 2017-10-01 ENCOUNTER — Other Ambulatory Visit: Payer: Self-pay | Admitting: Family Medicine

## 2017-10-01 NOTE — Telephone Encounter (Signed)
Please verify with patient what dose of metoprolol she has been taking. The last prescription we sent in was for 25mg  about a year ago, but the prescription request was for 50mg .

## 2017-10-03 NOTE — Telephone Encounter (Signed)
Patient called back stating that she has been taking one  50mg  tablet daily of the Metoprolol. She says this dosage was sent in 12/25/2016 by Dr. Caryn Section.

## 2017-10-03 NOTE — Telephone Encounter (Signed)
LMTCB 10/03/2017  Thanks,   -Mickel Baas

## 2018-07-02 ENCOUNTER — Other Ambulatory Visit: Payer: Self-pay | Admitting: Family Medicine

## 2018-08-04 ENCOUNTER — Other Ambulatory Visit: Payer: Self-pay | Admitting: Family Medicine

## 2018-10-01 ENCOUNTER — Other Ambulatory Visit: Payer: Self-pay | Admitting: Family Medicine

## 2019-01-01 ENCOUNTER — Other Ambulatory Visit: Payer: Self-pay | Admitting: Family Medicine

## 2019-05-04 ENCOUNTER — Other Ambulatory Visit: Payer: Self-pay | Admitting: Family Medicine

## 2019-05-08 ENCOUNTER — Ambulatory Visit (INDEPENDENT_AMBULATORY_CARE_PROVIDER_SITE_OTHER): Payer: Medicare Other | Admitting: Family Medicine

## 2019-05-08 ENCOUNTER — Encounter: Payer: Self-pay | Admitting: Family Medicine

## 2019-05-15 ENCOUNTER — Encounter: Payer: Self-pay | Admitting: Family Medicine

## 2019-05-15 ENCOUNTER — Other Ambulatory Visit: Payer: Self-pay

## 2019-05-15 ENCOUNTER — Ambulatory Visit (INDEPENDENT_AMBULATORY_CARE_PROVIDER_SITE_OTHER): Payer: Medicare Other | Admitting: Family Medicine

## 2019-05-15 VITALS — BP 128/82 | HR 97 | Temp 96.8°F | Wt 208.0 lb

## 2019-05-15 DIAGNOSIS — R7303 Prediabetes: Secondary | ICD-10-CM

## 2019-05-15 DIAGNOSIS — Z1159 Encounter for screening for other viral diseases: Secondary | ICD-10-CM

## 2019-05-15 DIAGNOSIS — E559 Vitamin D deficiency, unspecified: Secondary | ICD-10-CM

## 2019-05-15 DIAGNOSIS — E785 Hyperlipidemia, unspecified: Secondary | ICD-10-CM

## 2019-05-15 DIAGNOSIS — F410 Panic disorder [episodic paroxysmal anxiety] without agoraphobia: Secondary | ICD-10-CM

## 2019-05-15 DIAGNOSIS — I10 Essential (primary) hypertension: Secondary | ICD-10-CM | POA: Diagnosis not present

## 2019-05-15 LAB — POCT GLYCOSYLATED HEMOGLOBIN (HGB A1C)

## 2019-05-15 MED ORDER — SERTRALINE HCL 50 MG PO TABS: 75.0000 mg | ORAL_TABLET | Freq: Every day | ORAL | 12 refills | Status: DC

## 2019-05-15 MED ORDER — IMIPRAMINE HCL 50 MG PO TABS
50.0000 mg | ORAL_TABLET | Freq: Two times a day (BID) | ORAL | 3 refills | Status: DC
Start: 2019-05-15 — End: 2020-04-04

## 2019-05-15 MED ORDER — METOPROLOL SUCCINATE ER 50 MG PO TB24: 50.0000 mg | ORAL_TABLET | Freq: Every day | ORAL | 3 refills | Status: DC

## 2019-05-15 MED ORDER — OMEPRAZOLE 20 MG PO CPDR: 20.0000 mg | DELAYED_RELEASE_CAPSULE | Freq: Every day | ORAL | 3 refills | Status: DC

## 2019-05-15 NOTE — Progress Notes (Signed)
Patient: Brittany Benton Female    DOB: 12/19/1955   64 y.o.   MRN: [id number] Visit Date: 05/15/2019  Today's Provider: Lelon Huh, MD   Chief Complaint  Patient presents with  . Pre-diabetes  . Hyperlipidemia  . Hypertension   Subjective:     HPI  The patient is a 64 year old female who has not been seen since 07/2017.  She presents today for medication refills.  1. Prediabetes Her last A1C was 2 years ago and was 5.1.  She does not check her glucose at home and reports no signs or symptoms to suggest hypoglycemic events.  She is due for her diabetic foot and eye exam, urine micro-albumin and A1C.  She is not currently on any medication.    2. Essential (primary) hypertension The patient is also here to follow up on her blood pressure.  As noted she has not been seen and does not have any current readings.   BP Readings from Last 3 Encounters:  05/15/19 128/82  08/07/17 128/74  08/02/17 138/78   3. Hyperlipidemia, unspecified hyperlipidemia type Patient is in need of having her lipids checked she is currently not on any medication, although she states she was previously on Lipitor which she tolerated well.  Lab Results  Component Value Date   CHOL 212 (H) 03/30/2016   HDL 39 (L) 03/30/2016   LDLCALC 141 (H) 03/30/2016   TRIG 159 (H) 03/30/2016   CHOLHDL 5.4 (H) 03/30/2016   Follow up anxiety and depression She continues on sertraline and imipramine and reports these medications are working very well. Mood has been very stable and no panic or anxiety attacks since being on sertraline. She wishes to continue same medications.   Allergies  Allergen Reactions  . Codeine Nausea And Vomiting  . Morphine Hives and Swelling  . Tape Other (See Comments)    blisters blisters  . Tramadol Other (See Comments)    Hallucinations     Current Outpatient Medications:  .  imipramine (TOFRANIL) 50 MG tablet, TAKE ONE TABLET TWICE DAILY, Disp: 60 tablet, Rfl: 0 .   metoprolol succinate (TOPROL-XL) 50 MG 24 hr tablet, Take 1 tablet (50 mg total) by mouth daily., Disp: 30 tablet, Rfl: 0 .  omeprazole (PRILOSEC) 20 MG capsule, TAKE 1 CAPSULE EVERY DAY, Disp: 30 capsule, Rfl: 12 .  sertraline (ZOLOFT) 50 MG tablet, TAKE 1 AND 1/2 TABLET BY MOUTH DAILY, Disp: 135 tablet, Rfl: 12 .  Ca Phosphate-Cholecalciferol (CALCIUM 500 + D3) 250-500 MG-UNIT CHEW, Chew 1 tablet by mouth daily., Disp: , Rfl:  .  Cholecalciferol (VITAMIN D-3) 1000 units CAPS, Take 1 capsule by mouth daily., Disp: , Rfl:  .  Multiple Vitamins-Minerals (MULTIVITAMIN ADULT PO), Take 1 tablet by mouth daily., Disp: , Rfl:  .  Wheat Dextrin (BENEFIBER DRINK MIX PO), Take 10 mLs by mouth 3 (three) times daily as needed., Disp: , Rfl:   Review of Systems  Constitutional: Negative for fatigue.  HENT: Negative for congestion, sinus pain and sore throat.   Respiratory: Negative for cough, chest tightness and shortness of breath.   Cardiovascular: Negative for chest pain, palpitations and leg swelling.  Gastrointestinal: Negative for abdominal pain.  Endocrine: Negative for polydipsia and polyuria.  Musculoskeletal: Negative for myalgias.    Social History   Tobacco Use  . Smoking status: Never Smoker  . Smokeless tobacco: Never Used  Substance Use Topics  . Alcohol use: Not Currently    Alcohol/week:  0.0 standard drinks      Objective:   BP 128/82 (BP Location: Right Arm, Patient Position: Sitting, Cuff Size: Normal)   Pulse 97   Temp (!) 96.8 F (36 C) (Skin)   Wt 208 lb (94.3 kg)   SpO2 99%   BMI 40.62 kg/m  Vitals:   05/15/19 0835  BP: 128/82  Pulse: 97  Temp: (!) 96.8 F (36 C)  TempSrc: Skin  SpO2: 99%  Weight: 208 lb (94.3 kg)  Body mass index is 40.62 kg/m.   Physical Exam   General: Appearance:    Obese female in no acute distress  Eyes:    PERRL, conjunctiva/corneas clear, EOM's intact       Lungs:     Clear to auscultation bilaterally, respirations unlabored   Heart:    Normal heart rate. Normal rhythm. No murmurs, rubs, or gallops.   MS:   All extremities are intact.   Neurologic:   Awake, alert, oriented x 3. No apparent focal neurological           defect.       Results for orders placed or performed in visit on 05/15/19  POCT glycosylated hemoglobin (Hb A1C)  Result Value Ref Range   Hemoglobin A1C     HbA1c POC (<> result, manual entry)     HbA1c, POC (prediabetic range)     HbA1c, POC (controlled diabetic range)           Assessment & Plan    1. Prediabetes Has been well controlled since bariatric surgery in 2017, but she has put on significant weight, nearly 50 pounds, since 2019.   2. Essential (primary) hypertension Well controlled.  Continue current medications.   - EKG 12-Lead  3. Hyperlipidemia, unspecified hyperlipidemia type Previously on atorvastatin which she is willing to take again If cholesterol is back up.  - Comprehensive metabolic panel - Lipid panel - CBC  4. Vitamin D deficiency  - VITAMIN D 25 Hydroxy (Vit-D Deficiency, Fractures)  5. Morbid obesity ([place]) Encouraged to start back on daily exercise weather permitting.  - TSH  6. Panic disorder Doing very well on current combincation of sertraline and imipramine.   7. Need for hepatitis C screening test  - Hepatitis C antibody      Lelon Huh, MD  [facility]

## 2019-05-15 NOTE — Patient Instructions (Signed)
.   Please review the attached list of medications and notify my office if there are any errors.   . Please bring all of your medications to every appointment so we can make sure that our medication list is the same as yours.   . Please call the [facility] ([phone]) to schedule a routine screening mammogram.  

## 2019-05-16 LAB — COMPREHENSIVE METABOLIC PANEL
ALT: 8 IU/L (ref 0–32)
AST: 17 IU/L (ref 0–40)
Albumin/Globulin Ratio: 1.6 (ref 1.2–2.2)
Albumin: 4.2 g/dL (ref 3.8–4.8)
Alkaline Phosphatase: 102 IU/L (ref 39–117)
BUN/Creatinine Ratio: 13 (ref 12–28)
BUN: 15 mg/dL (ref 8–27)
Bilirubin Total: 0.4 mg/dL (ref 0.0–1.2)
CO2: 23 mmol/L (ref 20–29)
Calcium: 9.5 mg/dL (ref 8.7–10.3)
Chloride: 100 mmol/L (ref 96–106)
Creatinine, Ser: 1.12 mg/dL — ABNORMAL HIGH (ref 0.57–1.00)
GFR calc Af Amer: 60 mL/min/{1.73_m2} (ref >59)
GFR calc non Af Amer: 52 mL/min/{1.73_m2} — ABNORMAL LOW (ref >59)
Globulin, Total: 2.7 g/dL (ref 1.5–4.5)
Glucose: 97 mg/dL (ref 65–99)
Potassium: 4.6 mmol/L (ref 3.5–5.2)
Sodium: 138 mmol/L (ref 134–144)
Total Protein: 6.9 g/dL (ref 6.0–8.5)

## 2019-05-16 LAB — TSH: TSH: 4 u[IU]/mL (ref 0.450–4.500)

## 2019-05-16 LAB — CBC
Hematocrit: 44.3 % (ref 34.0–46.6)
Hemoglobin: 14.4 g/dL (ref 11.1–15.9)
MCH: 26.3 pg — ABNORMAL LOW (ref 26.6–33.0)
MCHC: 32.5 g/dL (ref 31.5–35.7)
MCV: 81 fL (ref 79–97)
Platelets: 315 10*3/uL (ref 150–450)
RBC: 5.48 x10E6/uL — ABNORMAL HIGH (ref 3.77–5.28)
RDW: 13.7 % (ref 11.7–15.4)
WBC: 7 10*3/uL (ref 3.4–10.8)

## 2019-05-16 LAB — LIPID PANEL
Chol/HDL Ratio: 5.5 ratio — ABNORMAL HIGH (ref 0.0–4.4)
Cholesterol, Total: 268 mg/dL — ABNORMAL HIGH (ref 100–199)
HDL: 49 mg/dL (ref >39)
LDL Chol Calc (NIH): 174 mg/dL — ABNORMAL HIGH (ref 0–99)
Triglycerides: 238 mg/dL — ABNORMAL HIGH (ref 0–149)
VLDL Cholesterol Cal: 45 mg/dL — ABNORMAL HIGH (ref 5–40)

## 2019-05-16 LAB — VITAMIN D 25 HYDROXY (VIT D DEFICIENCY, FRACTURES): Vit D, 25-Hydroxy: 22.1 ng/mL — ABNORMAL LOW (ref 30.0–100.0)

## 2019-05-16 LAB — HEPATITIS C ANTIBODY: Hep C Virus Ab: <0.1 s/co ratio (ref 0.0–0.9)

## 2019-05-18 ENCOUNTER — Other Ambulatory Visit: Payer: Self-pay | Admitting: Family Medicine

## 2019-05-18 DIAGNOSIS — E785 Hyperlipidemia, unspecified: Secondary | ICD-10-CM

## 2019-05-18 MED ORDER — ATORVASTATIN CALCIUM 20 MG PO TABS: 20.0000 mg | ORAL_TABLET | Freq: Every evening | ORAL | 30 refills | Status: DC

## 2019-05-22 NOTE — Progress Notes (Signed)
appt cancelled

## 2019-05-29 ENCOUNTER — Other Ambulatory Visit: Payer: 59

## 2019-06-09 ENCOUNTER — Other Ambulatory Visit: Payer: Self-pay | Admitting: Family Medicine

## 2019-06-09 DIAGNOSIS — E785 Hyperlipidemia, unspecified: Secondary | ICD-10-CM

## 2019-06-09 NOTE — Telephone Encounter (Signed)
Requested Prescriptions  Pending Prescriptions Disp Refills  . atorvastatin (LIPITOR) 20 MG tablet [Pharmacy Med Name: ATORVASTATIN CALCIUM 20 MG TAB] 90 tablet 1    Sig: TAKE 1 TABLET BY MOUTH EVERY EVENING     Cardiovascular:  Antilipid - Statins Failed - 06/09/2019  4:05 PM      Failed - Total Cholesterol in normal range and within 360 days    Cholesterol, Total  Date Value Ref Range Status  05/15/2019 268 (H) 100 - 199 mg/dL Final         Failed - LDL in normal range and within 360 days    LDL Chol Calc (NIH)  Date Value Ref Range Status  05/15/2019 174 (H) 0 - 99 mg/dL Final         Failed - Triglycerides in normal range and within 360 days    Triglycerides  Date Value Ref Range Status  05/15/2019 238 (H) 0 - 149 mg/dL Final         Passed - HDL in normal range and within 360 days    HDL  Date Value Ref Range Status  05/15/2019 49 >39 mg/dL Final         Passed - Patient is not pregnant      Passed - Valid encounter within last 12 months    Recent Outpatient Visits          3 weeks ago Prediabetes   [facility] Birdie Sons, MD   1 month ago Erroneous encounter - disregard   [facility] Birdie Sons, MD   1 year ago [place], [place], [place]   1 year ago Sinusitis, unspecified chronicity, unspecified location   [facility] Birdie Sons, MD   2 years ago Pre-op exam   [facility] Birdie Sons, MD      Future Appointments            In 5 months Fisher, Kirstie Peri, MD [facility] [place]

## 2019-06-12 ENCOUNTER — Ambulatory Visit: Payer: Medicare Other | Attending: Internal Medicine

## 2019-06-12 DIAGNOSIS — Z23 Encounter for immunization: Secondary | ICD-10-CM

## 2019-06-12 NOTE — Progress Notes (Signed)
   Covid-19 Vaccination Clinic  Name:  EVOLETTE LASSWELL    MRN: [id number] DOB: 09/25/1955  06/12/2019  Ms. Mcadoo was observed post Covid-19 immunization for 30 minutes based on pre-vaccination screening without incidence. She was provided with Vaccine Information Sheet and instruction to access the V-Safe system.   Ms. Vock was instructed to call 911 with any severe reactions post vaccine: Marland Kitchen Difficulty breathing  . Swelling of your face and throat  . A fast heartbeat  . A bad rash all over your body  . Dizziness and weakness    Immunizations Administered    Name Date Dose VIS Date Route   Pfizer COVID-19 Vaccine 06/12/2019 10:14 AM 0.3 mL 03/27/2019 Intramuscular   Manufacturer: [place]   Lot: [id number]   [place]: [id number]

## 2019-07-08 ENCOUNTER — Ambulatory Visit: Payer: Medicare Other | Attending: Internal Medicine

## 2019-07-08 DIAGNOSIS — Z23 Encounter for immunization: Secondary | ICD-10-CM

## 2019-07-08 NOTE — Progress Notes (Signed)
   Covid-19 Vaccination Clinic  Name:  Brittany Benton    MRN: [id number] DOB: Sep 10, 1955  07/08/2019  [name] was observed post Covid-19 immunization for 30 minutes based on pre-vaccination screening without incident. She was provided with Vaccine Information Sheet and instruction to access the V-Safe system.   [name] was instructed to call 911 with any severe reactions post vaccine: Marland Kitchen Difficulty breathing  . Swelling of face and throat  . A fast heartbeat  . A bad rash all over body  . Dizziness and weakness   Immunizations Administered    Name Date Dose VIS Date Route   Pfizer COVID-19 Vaccine 07/08/2019  9:45 AM 0.3 mL 03/27/2019 Intramuscular   Manufacturer: [place]   Lot: [id number]   [place]: [id number]

## 2019-11-11 ENCOUNTER — Ambulatory Visit: Payer: Self-pay | Admitting: Family Medicine

## 2019-12-10 DIAGNOSIS — J301 Allergic rhinitis due to pollen: Secondary | ICD-10-CM | POA: Diagnosis not present

## 2019-12-10 DIAGNOSIS — J019 Acute sinusitis, unspecified: Secondary | ICD-10-CM | POA: Diagnosis not present

## 2019-12-22 DIAGNOSIS — J0100 Acute maxillary sinusitis, unspecified: Secondary | ICD-10-CM | POA: Diagnosis not present

## 2020-01-05 ENCOUNTER — Telehealth: Payer: Self-pay | Admitting: Family Medicine

## 2020-01-05 NOTE — Telephone Encounter (Signed)
Copied from [place] [phone]. Topic: Medicare AWV >> Jan 05, 2020  9:33 AM Cher Nakai R wrote: Reason for CRM:  Left message for patient to call back and schedule Medicare Annual Wellness Visit (AWV) either virtually or in office.  No hx of AWV eligible as of 07/16/2019 AWVI   please schedule at anytime with [facility] Health Advisor.

## 2020-03-31 ENCOUNTER — Telehealth: Payer: Self-pay | Admitting: Cardiovascular Disease

## 2020-03-31 NOTE — Telephone Encounter (Signed)
3 attempts to schedule fu appt from recall list.   Deleting recall.   

## 2020-04-04 ENCOUNTER — Other Ambulatory Visit: Payer: Self-pay | Admitting: Family Medicine

## 2020-04-04 DIAGNOSIS — F410 Panic disorder [episodic paroxysmal anxiety] without agoraphobia: Secondary | ICD-10-CM

## 2020-04-04 DIAGNOSIS — I10 Essential (primary) hypertension: Secondary | ICD-10-CM

## 2020-04-04 NOTE — Telephone Encounter (Signed)
Requested medication (s) are due for refill today: yes  Requested medication (s) are on the active medication list: yes  Last refill: 01/04/2020  Future visit scheduled: no  Notes to clinic:  overdue for follow up appt Vm left for patient to callback    Requested Prescriptions  Pending Prescriptions Disp Refills   metoprolol succinate (TOPROL-XL) 50 MG 24 hr tablet [Pharmacy Med Name: METOPROLOL SUCCINATE ER 50 MG TAB] 90 tablet 3    Sig: TAKE ONE TABLET BY MOUTH EVERY DAY      Cardiovascular:  Beta Blockers Failed - 04/04/2020  3:10 PM      Failed - Valid encounter within last 6 months    Recent Outpatient Visits           10 months ago Prediabetes   [facility] Birdie Sons, MD   11 months ago Erroneous encounter - disregard   [facility] Birdie Sons, MD   2 years ago [place], [place], [place]   2 years ago Sinusitis, unspecified chronicity, unspecified location   [facility] Birdie Sons, MD   3 years ago Pre-op exam   [facility] Birdie Sons, MD                Passed - Last BP in normal range    BP Readings from Last 1 Encounters:  05/15/19 128/82          Passed - Last Heart Rate in normal range    Pulse Readings from Last 1 Encounters:  05/15/19 97

## 2020-04-04 NOTE — Telephone Encounter (Signed)
Requested medication (s) are due for refill today: yes  Requested medication (s) are on the active medication list: yes  Last refill: 01/04/2020  Future visit scheduled: no  Notes to clinic: overdue for follow up Vm left for patient to callback     Requested Prescriptions  Pending Prescriptions Disp Refills   imipramine (TOFRANIL) 50 MG tablet [Pharmacy Med Name: IMIPRAMINE HCL 50 MG TAB] 180 tablet 3    Sig: TAKE ONE TABLET BY MOUTH TWICE DAILY      Psychiatry:  Antidepressants - Heterocyclics (TCAs) Failed - 04/04/2020  3:09 PM      Failed - Valid encounter within last 6 months    Recent Outpatient Visits           10 months ago Prediabetes   [facility] Birdie Sons, MD   11 months ago Erroneous encounter - disregard   [facility] Birdie Sons, MD   2 years ago [place], [place], [place]   2 years ago Sinusitis, unspecified chronicity, unspecified location   [facility] Birdie Sons, MD   3 years ago Pre-op exam   [facility] Birdie Sons, MD

## 2020-05-03 ENCOUNTER — Other Ambulatory Visit: Payer: Self-pay | Admitting: Family Medicine

## 2020-05-03 ENCOUNTER — Encounter: Payer: Self-pay | Admitting: *Deleted

## 2020-05-03 DIAGNOSIS — I10 Essential (primary) hypertension: Secondary | ICD-10-CM

## 2020-05-03 DIAGNOSIS — E785 Hyperlipidemia, unspecified: Secondary | ICD-10-CM

## 2020-05-03 NOTE — Telephone Encounter (Signed)
Courtesy refill provided. MyChart message sent to patient advising appointment was needed. VM left on 04/04/20 advising patient to return call to schedule appointment.

## 2020-05-05 NOTE — Progress Notes (Signed)
Subjective:   Brittany Benton is a 65 y.o. female who presents for an Initial Medicare Annual Wellness Visit.  I connected with Jonell Cluck today by telephone and verified that I am speaking with the correct person using two identifiers. Location patient: home Location provider: work Persons participating in the virtual visit: patient, provider.   I discussed the limitations, risks, security and privacy concerns of performing an evaluation and management service by telephone and the availability of in person appointments. I also discussed with the patient that there may be a patient responsible charge related to this service. The patient expressed understanding and verbally consented to this telephonic visit.    Interactive audio and video telecommunications were attempted between this provider and patient, however failed, due to patient having technical difficulties OR patient did not have access to video capability.  We continued and completed visit with audio only.   Review of Systems    N/A  Cardiac Risk Factors include: advanced age (>39men, >41 women);dyslipidemia     Objective:    There were no vitals filed for this visit. There is no height or weight on file to calculate BMI.  Advanced Directives 05/09/2020 08/11/2016 08/08/2016 08/08/2016 07/25/2016  Does Patient Have a Medical Advance Directive? No No No No No  Would patient like information on creating a medical advance directive? Yes (ED - Information included in AVS) No - Patient declined No - Patient declined - No - Patient declined    Current Medications (verified) Outpatient Encounter Medications as of 05/09/2020  Medication Sig  . atorvastatin (LIPITOR) 20 MG tablet TAKE 1 TABLET BY MOUTH EVERY EVENING  . imipramine (TOFRANIL) 50 MG tablet Take 1 tablet (50 mg total) by mouth 2 (two) times daily. Please schedule an office visit before anymore refills.  . metoprolol succinate (TOPROL-XL) 50 MG 24 hr tablet TAKE ONE  TABLET BY MOUTH EVERY DAY NEED APPOINTMENT  . omeprazole (PRILOSEC) 20 MG capsule TAKE 1 CAPSULE BY MOUTH ONCE DAILY  . sertraline (ZOLOFT) 50 MG tablet Take 1.5 tablets (75 mg total) by mouth daily.  . Ca Phosphate-Cholecalciferol 250-500 MG-UNIT CHEW Chew 1 tablet by mouth daily. (Patient not taking: Reported on 05/09/2020)  . Cholecalciferol (VITAMIN D-3) 1000 units CAPS Take 1 capsule by mouth daily. (Patient not taking: Reported on 05/09/2020)  . Multiple Vitamins-Minerals (MULTIVITAMIN ADULT PO) Take 1 tablet by mouth daily. (Patient not taking: Reported on 05/09/2020)  . Wheat Dextrin (BENEFIBER DRINK MIX PO) Take 10 mLs by mouth 3 (three) times daily as needed. (Patient not taking: Reported on 05/09/2020)   No facility-administered encounter medications on file as of 05/09/2020.    Allergies (verified) Codeine, Morphine, Tape, and Tramadol   History: Past Medical History:  Diagnosis Date  . Anxiety   . Arthritis    osteoarthritis -right hip  . 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 GERD (gastroesophageal reflux disease)   . Hernia, incisional    after renal surgery  . History of chicken pox   . Hyperlipidemia   . Renal cell carcinoma ([place])    s/p right nephrectomy 1994  . Sleep apnea    Past Surgical History:  Procedure Laterality Date  . APPENDECTOMY  1994  . BREAST BIOPSY Right 1991   Negative  . CHOLECYSTECTOMY  1994  . LAPAROSCOPIC GASTRIC SLEEVE RESECTION  02/02/2016   Dr Duke Salvia at [facility]  . NEPHRECTOMY  1994   Renal Cell Carcinoma  . parotid gland removal  1990   also  removed a tumor  . TONSILLECTOMY    . TOTAL HIP ARTHROPLASTY Right 08/08/2016   Procedure: TOTAL HIP ARTHROPLASTY;  Surgeon: Earnestine Leys, MD;  Location: [facility] ORS;  Service: Orthopedics;  Laterality: Right;   Family History  Problem Relation Age of Onset  . Osteoporosis Mother   . Dementia Mother   . Heart disease Father   . Heart attack Father   . Breast cancer Maternal Aunt     Social History   Socioeconomic History  . Marital status: Married    Spouse name: Not on file  . Number of children: 1  . Years of education: 25  . Highest education level: High school graduate  Occupational History  . Occupation: Retired    Comment: 2016  Tobacco Use  . Smoking status: Never Smoker  . Smokeless tobacco: Never Used  Substance and Sexual Activity  . Alcohol use: Not Currently    Alcohol/week: 0.0 standard drinks  . Drug use: No  . Sexual activity: Not on file  Other Topics Concern  . Not on file  Social History Narrative  . Not on file   Social Determinants of Health   Financial Resource Strain: Low Risk   . Difficulty of Paying Living Expenses: Not hard at all  Food Insecurity: No Food Insecurity  . Worried About Charity fundraiser in the Last Year: Never true  . Ran Out of Food in the Last Year: Never true  Transportation Needs: No Transportation Needs  . Lack of Transportation (Medical): No  . Lack of Transportation (Non-Medical): No  Physical Activity: Inactive  . Days of Exercise per Week: 0 days  . Minutes of Exercise per Session: 0 min  Stress: No Stress Concern Present  . Feeling of Stress : Not at all  Social Connections: Moderately Isolated  . Frequency of Communication with Friends and Family: More than three times a week  . Frequency of Social Gatherings with Friends and Family: More than three times a week  . Attends Religious Services: Never  . Active Member of Clubs or Organizations: No  . Attends Archivist Meetings: Never  . Marital Status: Married    Tobacco Counseling Counseling given: Not Answered   Clinical Intake:  Pre-visit preparation completed: Yes  Pain : No/denies pain     Nutritional Risks: None Diabetes: No  How often do you need to have someone help you when you read instructions, pamphlets, or other written materials from your doctor or pharmacy?: 1 - Never  Diabetic? No  Interpreter  Needed?: No  Information entered by :: [facility], LPN   Activities of Daily Living In your present state of health, do you have any difficulty performing the following activities: 05/09/2020 05/15/2019  Hearing? N Y  Vision? N N  Difficulty concentrating or making decisions? N N  Walking or climbing stairs? N N  Dressing or bathing? N N  Doing errands, shopping? N N  Preparing Food and eating ? N -  Using the Toilet? N -  In the past six months, have you accidently leaked urine? N -  Do you have problems with loss of bowel control? N -  Managing your Medications? N -  Managing your Finances? N -  Housekeeping or managing your Housekeeping? N -  Some recent data might be hidden    Patient Care Team: Birdie Sons, MD as PCP - General (Family Medicine) Rockey Situ, Kathlene November, MD as Consulting Physician (Cardiology) Earnestine Leys, MD (Specialist) Pa, [facility] (Optometry)  Indicate any recent Medical Services you may have received from other than [facility] providers in the past year (date may be approximate).     Assessment:   This is a routine wellness examination for Brittany Benton.  Hearing/Vision screen No exam data present  Dietary issues and exercise activities discussed: Current Exercise Habits: The patient does not participate in regular exercise at present, Exercise limited by: None identified  Goals    . Prevent falls     Recommend to remove any items from the home that may cause slips or trips.      Depression Screen PHQ 2/9 Scores 05/09/2020 05/15/2019 08/02/2017 09/29/2014  PHQ - 2 Score 0 0 0 0  PHQ- 9 Score - - 1 0    Fall Risk Fall Risk  05/09/2020 05/15/2019  Falls in the past year? 1 0  Number falls in past yr: 1 -  Injury with Fall? 0 -  Follow up Falls prevention discussed -    FALL RISK PREVENTION PERTAINING TO THE HOME:  Any stairs in or around the home? Yes  If so, are there any without handrails? No  Home free of loose throw rugs in walkways,  pet beds, electrical cords, etc? Yes  Adequate lighting in your home to reduce risk of falls? Yes   ASSISTIVE DEVICES UTILIZED TO PREVENT FALLS:  Life alert? No  Use of a cane, walker or w/c? No  Grab bars in the bathroom? No  Shower chair or bench in shower? No  Elevated toilet seat or a handicapped toilet? Yes    Cognitive Function: Declined today.         Immunizations Immunization History  Administered Date(s) Administered  . PFIZER(Purple Top)SARS-COV-2 Vaccination 06/12/2019, 07/08/2019, 04/22/2020  . Tdap 09/29/2014  . Zoster 09/29/2014    TDAP status: Up to date  Flu Vaccine status: Declined, Education has been provided regarding the importance of this vaccine but patient still declined. Advised may receive this vaccine at local pharmacy or Health Dept. Aware to provide a copy of the vaccination record if obtained from local pharmacy or Health Dept. Verbalized acceptance and understanding.  Pneumococcal vaccine status: Declined,  Education has been provided regarding the importance of this vaccine but patient still declined. Advised may receive this vaccine at local pharmacy or Health Dept. Aware to provide a copy of the vaccination record if obtained from local pharmacy or Health Dept. Verbalized acceptance and understanding.   Covid-19 vaccine status: Completed vaccines  Qualifies for Shingles Vaccine? Yes   Zostavax completed Yes   Shingrix Completed?: No.    Education has been provided regarding the importance of this vaccine. Patient has been advised to call insurance company to determine out of pocket expense if they have not yet received this vaccine. Advised may also receive vaccine at local pharmacy or Health Dept. Verbalized acceptance and understanding.  Screening Tests Health Maintenance  Topic Date Due  . MAMMOGRAM  03/26/2018  . DEXA SCAN  Never done  . PNA vac Low Risk Adult (1 of 2 - PCV13) 05/14/2020 (Originally 07/17/2018)  . INFLUENZA VACCINE   07/14/2020 (Originally 11/15/2019)  . COLONOSCOPY (Pts 45-64yrs Insurance coverage will need to be confirmed)  02/11/2021  . TETANUS/TDAP  09/28/2024  . COVID-19 Vaccine  Completed  . Hepatitis C Screening  Completed    Health Maintenance  Health Maintenance Due  Topic Date Due  . MAMMOGRAM  03/26/2018  . DEXA SCAN  Never done    Colorectal cancer screening: Type  of screening: Colonoscopy. Completed 02/12/11. Repeat every 10 years  Mammogram status: Ordered today. Pt provided with contact info and advised to call to schedule appt.   Bone Density status: Ordered today. Pt provided with contact info and advised to call to schedule appt.  Lung Cancer Screening: (Low Dose CT Chest recommended if Age 57-80 years, 30 pack-year currently smoking OR have quit w/in 15years.) does not qualify.   Additional Screening:  Hepatitis C Screening: Up to date  Vision Screening: Recommended annual ophthalmology exams for early detection of glaucoma and other disorders of the eye. Is the patient up to date with their annual eye exam?  Yes  Who is the provider or what is the name of the office in which the patient attends annual eye exams? [facility] If pt is not established with a provider, would they like to be referred to a provider to establish care? No .   Dental Screening: Recommended annual dental exams for proper oral hygiene  Community Resource Referral / Chronic Care Management: CRR required this visit?  No   CCM required this visit?  No      Plan:     I have personally reviewed and noted the following in the patient's chart:   . Medical and social history . Use of alcohol, tobacco or illicit drugs  . Current medications and supplements . Functional ability and status . Nutritional status . Physical activity . Advanced directives . List of other physicians . Hospitalizations, surgeries, and ER visits in previous 12 months . Vitals . Screenings to include cognitive,  depression, and falls . Referrals and appointments  In addition, I have reviewed and discussed with patient certain preventive protocols, quality metrics, and best practice recommendations. A written personalized care plan for preventive services as well as general preventive health recommendations were provided to patient.     Adalaya [place], [place]   9/83/3825   Nurse Notes: Pt declined receiving a future flu or pneumonia vaccine.

## 2020-05-09 ENCOUNTER — Ambulatory Visit (INDEPENDENT_AMBULATORY_CARE_PROVIDER_SITE_OTHER): Payer: Medicare Other

## 2020-05-09 ENCOUNTER — Other Ambulatory Visit: Payer: Self-pay

## 2020-05-09 DIAGNOSIS — E2839 Other primary ovarian failure: Secondary | ICD-10-CM | POA: Diagnosis not present

## 2020-05-09 DIAGNOSIS — Z0000 Encounter for general adult medical examination without abnormal findings: Secondary | ICD-10-CM

## 2020-05-09 DIAGNOSIS — Z1231 Encounter for screening mammogram for malignant neoplasm of breast: Secondary | ICD-10-CM | POA: Diagnosis not present

## 2020-05-09 NOTE — Patient Instructions (Signed)
Brittany Benton , Thank you for taking time to come for your Medicare Wellness Visit. I appreciate your ongoing commitment to your health goals. Please review the following plan we discussed and let me know if I can assist you in the future.   Screening recommendations/referrals: Colonoscopy: Up to date, due 01/2021 Mammogram: Ordered today,  Bone Density: Previous DEXA scan was normal. No repeat needed unless advised by a physician. Recommended yearly ophthalmology/optometry visit for glaucoma screening and checkup Recommended yearly dental visit for hygiene and checkup  Vaccinations: Influenza vaccine: Currently due, declined receiving. Pneumococcal vaccine: Currently due, declined receiving. Tdap vaccine: Up to date, due 09/2024 Shingles vaccine: Shingrix discussed. Please contact your pharmacy for coverage information.     Advanced directives: Advance directive discussed with you today. I will mail you a copy for you to complete at home and have notarized. Once this is complete please bring a copy in to our office so we can scan it into your chart.  Conditions/risks identified: Fall risk preventatives discussed today.   Next appointment: 06/01/20 @ 8:40 AM with Dr Caryn Section    Preventive Care 67 Years and Older, Female Preventive care refers to lifestyle choices and visits with your health care provider that can promote health and wellness. What does preventive care include?  A yearly physical exam. This is also called an annual well check.  Dental exams once or twice a year.  Routine eye exams. Ask your health care provider how often you should have your eyes checked.  Personal lifestyle choices, including:  Daily care of your teeth and gums.  Regular physical activity.  Eating a healthy diet.  Avoiding tobacco and drug use.  Limiting alcohol use.  Practicing safe sex.  Taking low-dose aspirin every day.  Taking vitamin and mineral supplements as recommended by your health  care provider. What happens during an annual well check? The services and screenings done by your health care provider during your annual well check will depend on your age, overall health, lifestyle risk factors, and family history of disease. Counseling  Your health care provider may ask you questions about your:  Alcohol use.  Tobacco use.  Drug use.  Emotional well-being.  Home and relationship well-being.  Sexual activity.  Eating habits.  History of falls.  Memory and ability to understand (cognition).  Work and work Statistician.  Reproductive health. Screening  You may have the following tests or measurements:  Height, weight, and BMI.  Blood pressure.  Lipid and cholesterol levels. These may be checked every 5 years, or more frequently if you are over 67 years old.  Skin check.  Lung cancer screening. You may have this screening every year starting at age 67 if you have a 30-pack-year history of smoking and currently smoke or have quit within the past 15 years.  Fecal occult blood test (FOBT) of the stool. You may have this test every year starting at age 67.  Flexible sigmoidoscopy or colonoscopy. You may have a sigmoidoscopy every 5 years or a colonoscopy every 10 years starting at age 40.  Hepatitis C blood test.  Hepatitis B blood test.  Sexually transmitted disease (STD) testing.  Diabetes screening. This is done by checking your blood sugar (glucose) after you have not eaten for a while (fasting). You may have this done every 1-3 years.  Bone density scan. This is done to screen for osteoporosis. You may have this done starting at age 67.  Mammogram. This may be done every 1-2 years.  Talk to your health care provider about how often you should have regular mammograms. Talk with your health care provider about your test results, treatment options, and if necessary, the need for more tests. Vaccines  Your health care provider may recommend certain  vaccines, such as:  Influenza vaccine. This is recommended every year.  Tetanus, diphtheria, and acellular pertussis (Tdap, Td) vaccine. You may need a Td booster every 10 years.  Zoster vaccine. You may need this after age 67.  Pneumococcal 13-valent conjugate (PCV13) vaccine. One dose is recommended after age 67.  Pneumococcal polysaccharide (PPSV23) vaccine. One dose is recommended after age 67. Talk to your health care provider about which screenings and vaccines you need and how often you need them. This information is not intended to replace advice given to you by your health care provider. Make sure you discuss any questions you have with your health care provider. Document Released: 04/29/2015 Document Revised: 12/21/2015 Document Reviewed: 02/01/2015 Elsevier Interactive Patient Education  2017 [employer] Prevention in the Home Falls can cause injuries. They can happen to people of all ages. There are many things you can do to make your home safe and to help prevent falls. What can I do on the outside of my home?  Regularly fix the edges of walkways and driveways and fix any cracks.  Remove anything that might make you trip as you walk through a door, such as a raised step or threshold.  Trim any bushes or trees on the path to your home.  Use bright outdoor lighting.  Clear any walking paths of anything that might make someone trip, such as rocks or tools.  Regularly check to see if handrails are loose or broken. Make sure that both sides of any steps have handrails.  Any raised decks and porches should have guardrails on the edges.  Have any leaves, snow, or ice cleared regularly.  Use sand or salt on walking paths during winter.  Clean up any spills in your garage right away. This includes oil or grease spills. What can I do in the bathroom?  Use night lights.  Install grab bars by the toilet and in the tub and shower. Do not use towel bars as grab  bars.  Use non-skid mats or decals in the tub or shower.  If you need to sit down in the shower, use a plastic, non-slip stool.  Keep the floor dry. Clean up any water that spills on the floor as soon as it happens.  Remove soap buildup in the tub or shower regularly.  Attach bath mats securely with double-sided non-slip rug tape.  Do not have throw rugs and other things on the floor that can make you trip. What can I do in the bedroom?  Use night lights.  Make sure that you have a light by your bed that is easy to reach.  Do not use any sheets or blankets that are too big for your bed. They should not hang down onto the floor.  Have a firm chair that has side arms. You can use this for support while you get dressed.  Do not have throw rugs and other things on the floor that can make you trip. What can I do in the kitchen?  Clean up any spills right away.  Avoid walking on wet floors.  Keep items that you use a lot in easy-to-reach places.  If you need to reach something above you, use a strong step stool  that has a grab bar.  Keep electrical cords out of the way.  Do not use floor polish or wax that makes floors slippery. If you must use wax, use non-skid floor wax.  Do not have throw rugs and other things on the floor that can make you trip. What can I do with my stairs?  Do not leave any items on the stairs.  Make sure that there are handrails on both sides of the stairs and use them. Fix handrails that are broken or loose. Make sure that handrails are as long as the stairways.  Check any carpeting to make sure that it is firmly attached to the stairs. Fix any carpet that is loose or worn.  Avoid having throw rugs at the top or bottom of the stairs. If you do have throw rugs, attach them to the floor with carpet tape.  Make sure that you have a light switch at the top of the stairs and the bottom of the stairs. If you do not have them, ask someone to add them for  you. What else can I do to help prevent falls?  Wear shoes that:  Do not have high heels.  Have rubber bottoms.  Are comfortable and fit you well.  Are closed at the toe. Do not wear sandals.  If you use a stepladder:  Make sure that it is fully opened. Do not climb a closed stepladder.  Make sure that both sides of the stepladder are locked into place.  Ask someone to hold it for you, if possible.  Clearly mark and make sure that you can see:  Any grab bars or handrails.  First and last steps.  Where the edge of each step is.  Use tools that help you move around (mobility aids) if they are needed. These include:  Canes.  Walkers.  Scooters.  Crutches.  Turn on the lights when you go into a dark area. Replace any light bulbs as soon as they burn out.  Set up your furniture so you have a clear path. Avoid moving your furniture around.  If any of your floors are uneven, fix them.  If there are any pets around you, be aware of where they are.  Review your medicines with your doctor. Some medicines can make you feel dizzy. This can increase your chance of falling. Ask your doctor what other things that you can do to help prevent falls. This information is not intended to replace advice given to you by your health care provider. Make sure you discuss any questions you have with your health care provider. Document Released: 01/27/2009 Document Revised: 09/08/2015 Document Reviewed: 05/07/2014 Elsevier Interactive Patient Education  2017 [employer].

## 2020-06-01 ENCOUNTER — Encounter: Payer: Self-pay | Admitting: Family Medicine

## 2020-06-01 ENCOUNTER — Other Ambulatory Visit: Payer: Self-pay | Admitting: Family Medicine

## 2020-06-01 ENCOUNTER — Ambulatory Visit (INDEPENDENT_AMBULATORY_CARE_PROVIDER_SITE_OTHER): Payer: Medicare Other | Admitting: Family Medicine

## 2020-06-01 ENCOUNTER — Other Ambulatory Visit: Payer: Self-pay

## 2020-06-01 VITALS — BP 131/85 | HR 89 | Temp 97.6°F | Resp 16 | Ht 60.0 in | Wt 213.0 lb

## 2020-06-01 DIAGNOSIS — F410 Panic disorder [episodic paroxysmal anxiety] without agoraphobia: Secondary | ICD-10-CM

## 2020-06-01 DIAGNOSIS — E559 Vitamin D deficiency, unspecified: Secondary | ICD-10-CM

## 2020-06-01 DIAGNOSIS — I10 Essential (primary) hypertension: Secondary | ICD-10-CM

## 2020-06-01 DIAGNOSIS — E785 Hyperlipidemia, unspecified: Secondary | ICD-10-CM | POA: Diagnosis not present

## 2020-06-01 DIAGNOSIS — R7303 Prediabetes: Secondary | ICD-10-CM

## 2020-06-01 MED ORDER — IMIPRAMINE HCL 50 MG PO TABS: 50.0000 mg | ORAL_TABLET | Freq: Two times a day (BID) | ORAL | 3 refills | Status: DC

## 2020-06-01 MED ORDER — METOPROLOL SUCCINATE ER 50 MG PO TB24
50.0000 mg | ORAL_TABLET | Freq: Every day | ORAL | 4 refills | Status: DC
Start: 2020-06-01 — End: 2021-04-26

## 2020-06-01 NOTE — Progress Notes (Signed)
Established patient visit   Patient: Brittany Benton   DOB: 03-19-1956   65 y.o. Female  MRN: [id number] Visit Date: 06/01/2020  Today's healthcare provider: Lelon Huh, MD   Chief Complaint  Patient presents with  . Hypertension  . Hyperlipidemia  . Prediabetes   Subjective    HPI  Hypertension, follow-up  BP Readings from Last 3 Encounters:  06/01/20 131/85  05/15/19 128/82  08/07/17 128/74   Wt Readings from Last 3 Encounters:  06/01/20 213 lb (96.6 kg)  05/15/19 208 lb (94.3 kg)  08/07/17 159 lb (72.1 kg)     She was last seen for hypertension 1 year ago.  BP at that visit was 128/82. Management since that visit includes continuing same medications.  She reports good compliance with treatment. She is not having side effects.  She is following a Regular diet. She is not exercising. She does not smoke.  Use of agents associated with hypertension: none.   Outside blood pressures are not checked. Symptoms: No chest pain No chest pressure  No palpitations No syncope  No dyspnea No orthopnea  No paroxysmal nocturnal dyspnea No lower extremity edema   Pertinent labs: Lab Results  Component Value Date   CHOL 268 (H) 05/15/2019   HDL 49 05/15/2019   LDLCALC 174 (H) 05/15/2019   TRIG 238 (H) 05/15/2019   CHOLHDL 5.5 (H) 05/15/2019   Lab Results  Component Value Date   NA 138 05/15/2019   K 4.6 05/15/2019   CREATININE 1.12 (H) 05/15/2019   GFRNONAA 52 (L) 05/15/2019   GFRAA 60 05/15/2019   GLUCOSE 97 05/15/2019     The 10-year ASCVD risk score (Goff DC Jr., et al., 2013) is: 10.4%   ---------------------------------------------------------------------------------------------------  Prediabetes, Follow-up  Lab Results  Component Value Date   HGBA1C 5.1 06/25/2016   HGBA1C 5.1 03/26/2016   HGBA1C 6.1 05/09/2015   GLUCOSE 97 05/15/2019   GLUCOSE 115 (H) 08/09/2016   GLUCOSE 91 06/25/2016    Last seen for for this1 year ago.  Management  since that visit includes continue healthy diet. Current symptoms include none and have been stable.  Prior visit with dietician: no Current diet: well balanced Current exercise: none  Pertinent Labs:    Component Value Date/Time   CHOL 268 (H) 05/15/2019 0914   TRIG 238 (H) 05/15/2019 0914   CHOLHDL 5.5 (H) 05/15/2019 0914   CREATININE 1.12 (H) 05/15/2019 0914    Wt Readings from Last 3 Encounters:  06/01/20 213 lb (96.6 kg)  05/15/19 208 lb (94.3 kg)  08/07/17 159 lb (72.1 kg)    -----------------------------------------------------------------------------------------  Lipid/Cholesterol, Follow-up  Last lipid panel Other pertinent labs  Lab Results  Component Value Date   CHOL 268 (H) 05/15/2019   HDL 49 05/15/2019   LDLCALC 174 (H) 05/15/2019   TRIG 238 (H) 05/15/2019   CHOLHDL 5.5 (H) 05/15/2019   Lab Results  Component Value Date   ALT 8 05/15/2019   AST 17 05/15/2019   PLT 315 05/15/2019   TSH 4.000 05/15/2019     She was last seen for this 1 year ago.  Management since that visit includes restarting Atorvastatin 20mg  daily.  She reports good compliance with treatment. She is not having side effects.   Symptoms: No chest pain No chest pressure/discomfort  No dyspnea No lower extremity edema  No numbness or tingling of extremity No orthopnea  No palpitations No paroxysmal nocturnal dyspnea  No speech difficulty No syncope  Current diet: well balanced Current exercise: none  The 10-year ASCVD risk score Mikey Bussing DC Jr., et al., 2013) is: 10.4%  ---------------------------------------------------------------------------------------------------  Follow up for Vitamin D Deficiency:  The patient was last seen for this 1 year ago. Changes made at last visit include starting OTC vitamin d3 2000 units once a day .  Lab Results  Component Value Date   VD25OH 22.1 (L) 05/15/2019    She reports good compliance with treatment. Patient has been taking  1,000units of vitamin D daily. She feels that condition is Unchanged. She is not having side effects.   -----------------------------------------------------------------------------------------   She is also doing very well with sertraline which she takes for panic attacks. Has not had any attacks since being on current dose. No adverse effects.     Medications: Outpatient Medications Prior to Visit  Medication Sig  . atorvastatin (LIPITOR) 20 MG tablet TAKE 1 TABLET BY MOUTH EVERY EVENING  . Cholecalciferol (VITAMIN D-3) 1000 units CAPS Take 1 capsule by mouth daily.  Marland Kitchen imipramine (TOFRANIL) 50 MG tablet Take 1 tablet (50 mg total) by mouth 2 (two) times daily. Please schedule an office visit before anymore refills.  . metoprolol succinate (TOPROL-XL) 50 MG 24 hr tablet TAKE ONE TABLET BY MOUTH EVERY DAY NEED APPOINTMENT  . Multiple Vitamins-Minerals (MULTIVITAMIN ADULT PO) Take 1 tablet by mouth daily.  Marland Kitchen omeprazole (PRILOSEC) 20 MG capsule TAKE 1 CAPSULE BY MOUTH ONCE DAILY  . sertraline (ZOLOFT) 50 MG tablet Take 1.5 tablets (75 mg total) by mouth daily.  . Wheat Dextrin (BENEFIBER DRINK MIX PO) Take 10 mLs by mouth 3 (three) times daily as needed.  . [DISCONTINUED] Ca Phosphate-Cholecalciferol 250-500 MG-UNIT CHEW Chew 1 tablet by mouth daily. (Patient not taking: Reported on 06/01/2020)   No facility-administered medications prior to visit.    Review of Systems  Constitutional: Negative for appetite change, chills, fatigue and fever.  Respiratory: Negative for chest tightness and shortness of breath.   Cardiovascular: Negative for chest pain and palpitations.  Gastrointestinal: Negative for abdominal pain, nausea and vomiting.  Neurological: Negative for dizziness and weakness.       Objective    BP 131/85 (BP Location: Right Arm, Patient Position: Sitting, Cuff Size: Normal)   Pulse 89   Temp 97.6 F (36.4 C) (Temporal)   Resp 16   Ht 5' (1.524 m)   Wt 213 lb (96.6 kg)    BMI 41.60 kg/m     Physical Exam   General: Appearance:    Obese female in no acute distress  Eyes:    PERRL, conjunctiva/corneas clear, EOM's intact       Lungs:     Clear to auscultation bilaterally, respirations unlabored  Heart:    Normal heart rate. Normal rhythm. No murmurs, rubs, or gallops.   MS:   All extremities are intact.   Neurologic:   Awake, alert, oriented x 3. No apparent focal neurological           defect.        Assessment & Plan     1. Essential (primary) hypertension Well controlled.  Continue current medications.   - Comprehensive metabolic panel  2. Vitamin D deficiency Compliant with daily vitamin d supplement - VITAMIN D 25 Hydroxy (Vit-D Deficiency, Fractures)  3. Hyperlipidemia, unspecified hyperlipidemia type She is tolerating atorvastatin well with no adverse effects.   - CBC - Lipid panel - TSH  4. Panic disorder Doing well with sertraline which she wishes to continue.  5. Prediabetes  - Hemoglobin A1c   recommended flu and pneumonia vaccines which she declined.   She is scheduled for screening mammogram     The entirety of the information documented in the History of Present Illness, Review of Systems and Physical Exam were personally obtained by me. Portions of this information were initially documented by the CMA and reviewed by me for thoroughness and accuracy.      Lelon Huh, MD  [facility] [phone] (phone) [phone] (fax)  [place]

## 2020-06-01 NOTE — Patient Instructions (Addendum)
.   Please review the attached list of medications and notify my office if there are any errors.   I recommend that you get the Pneumovax-23 vaccine to protect yourself from certain strains of pneumonia. Please call our office at [phone] at your earliest convenience to schedule this vaccine.

## 2020-06-13 ENCOUNTER — Other Ambulatory Visit: Payer: Self-pay

## 2020-06-13 ENCOUNTER — Ambulatory Visit
Admission: RE | Admit: 2020-06-13 | Discharge: 2020-06-13 | Disposition: A | Discharge status: Home / Self Care | Payer: Medicare Other | Source: Ambulatory Visit | Attending: Family Medicine | Admitting: Family Medicine

## 2020-06-13 DIAGNOSIS — E2839 Other primary ovarian failure: Secondary | ICD-10-CM | POA: Diagnosis present

## 2020-06-13 DIAGNOSIS — Z1231 Encounter for screening mammogram for malignant neoplasm of breast: Secondary | ICD-10-CM | POA: Insufficient documentation

## 2020-06-13 DIAGNOSIS — M85852 Other specified disorders of bone density and structure, left thigh: Secondary | ICD-10-CM | POA: Diagnosis not present

## 2020-06-14 ENCOUNTER — Encounter: Payer: Self-pay | Admitting: Family Medicine

## 2020-06-14 DIAGNOSIS — M8580 Other specified disorders of bone density and structure, unspecified site: Secondary | ICD-10-CM | POA: Insufficient documentation

## 2020-07-01 ENCOUNTER — Other Ambulatory Visit: Payer: Self-pay | Admitting: Family Medicine

## 2020-07-01 DIAGNOSIS — E785 Hyperlipidemia, unspecified: Secondary | ICD-10-CM

## 2020-08-04 DIAGNOSIS — R7303 Prediabetes: Secondary | ICD-10-CM | POA: Diagnosis not present

## 2020-08-04 DIAGNOSIS — E785 Hyperlipidemia, unspecified: Secondary | ICD-10-CM | POA: Diagnosis not present

## 2020-08-04 DIAGNOSIS — E559 Vitamin D deficiency, unspecified: Secondary | ICD-10-CM | POA: Diagnosis not present

## 2020-08-04 DIAGNOSIS — I10 Essential (primary) hypertension: Secondary | ICD-10-CM | POA: Diagnosis not present

## 2020-08-05 LAB — LIPID PANEL
Chol/HDL Ratio: 2.7 ratio (ref 0.0–4.4)
Cholesterol, Total: 120 mg/dL (ref 100–199)
HDL: 45 mg/dL (ref >39)
LDL Chol Calc (NIH): 55 mg/dL (ref 0–99)
Triglycerides: 111 mg/dL (ref 0–149)
VLDL Cholesterol Cal: 20 mg/dL (ref 5–40)

## 2020-08-05 LAB — COMPREHENSIVE METABOLIC PANEL
ALT: 14 IU/L (ref 0–32)
AST: 22 IU/L (ref 0–40)
Albumin/Globulin Ratio: 1.4 (ref 1.2–2.2)
Albumin: 3.8 g/dL (ref 3.8–4.8)
Alkaline Phosphatase: 104 IU/L (ref 44–121)
BUN/Creatinine Ratio: 8 — ABNORMAL LOW (ref 12–28)
BUN: 8 mg/dL (ref 8–27)
Bilirubin Total: 0.2 mg/dL (ref 0.0–1.2)
CO2: 21 mmol/L (ref 20–29)
Calcium: 8.8 mg/dL (ref 8.7–10.3)
Chloride: 100 mmol/L (ref 96–106)
Creatinine, Ser: 0.95 mg/dL (ref 0.57–1.00)
Globulin, Total: 2.8 g/dL (ref 1.5–4.5)
Glucose: 92 mg/dL (ref 65–99)
Potassium: 3.9 mmol/L (ref 3.5–5.2)
Sodium: 139 mmol/L (ref 134–144)
Total Protein: 6.6 g/dL (ref 6.0–8.5)
eGFR: 66 mL/min/{1.73_m2} (ref >59)

## 2020-08-05 LAB — CBC
Hematocrit: 41 % (ref 34.0–46.6)
Hemoglobin: 13.1 g/dL (ref 11.1–15.9)
MCH: 24.5 pg — ABNORMAL LOW (ref 26.6–33.0)
MCHC: 32 g/dL (ref 31.5–35.7)
MCV: 77 fL — ABNORMAL LOW (ref 79–97)
Platelets: 218 10*3/uL (ref 150–450)
RBC: 5.34 x10E6/uL — ABNORMAL HIGH (ref 3.77–5.28)
RDW: 14.3 % (ref 11.7–15.4)
WBC: 4.1 10*3/uL (ref 3.4–10.8)

## 2020-08-05 LAB — TSH: TSH: 7.25 u[IU]/mL — ABNORMAL HIGH (ref 0.450–4.500)

## 2020-08-05 LAB — VITAMIN D 25 HYDROXY (VIT D DEFICIENCY, FRACTURES): Vit D, 25-Hydroxy: 33.3 ng/mL (ref 30.0–100.0)

## 2020-08-05 LAB — HEMOGLOBIN A1C
Est. average glucose Bld gHb Est-mCnc: 117 mg/dL
Hgb A1c MFr Bld: 5.7 % — ABNORMAL HIGH (ref 4.8–5.6)

## 2020-08-15 ENCOUNTER — Ambulatory Visit (INDEPENDENT_AMBULATORY_CARE_PROVIDER_SITE_OTHER): Payer: Medicare Other | Admitting: Family Medicine

## 2020-08-15 ENCOUNTER — Other Ambulatory Visit: Payer: Self-pay

## 2020-08-15 ENCOUNTER — Encounter: Payer: Self-pay | Admitting: Family Medicine

## 2020-08-15 VITALS — BP 119/82 | HR 87 | Temp 97.4°F | Resp 18 | Wt 208.4 lb

## 2020-08-15 DIAGNOSIS — G4733 Obstructive sleep apnea (adult) (pediatric): Secondary | ICD-10-CM | POA: Diagnosis not present

## 2020-08-15 DIAGNOSIS — R5383 Other fatigue: Secondary | ICD-10-CM | POA: Diagnosis not present

## 2020-08-15 DIAGNOSIS — J329 Chronic sinusitis, unspecified: Secondary | ICD-10-CM | POA: Diagnosis not present

## 2020-08-15 DIAGNOSIS — E039 Hypothyroidism, unspecified: Secondary | ICD-10-CM | POA: Diagnosis not present

## 2020-08-15 MED ORDER — LEVOTHYROXINE SODIUM 50 MCG PO TABS: 50.0000 ug | ORAL_TABLET | Freq: Every day | ORAL | 1 refills | Status: DC

## 2020-08-15 MED ORDER — AMOXICILLIN 500 MG PO CAPS
1000.0000 mg | ORAL_CAPSULE | Freq: Two times a day (BID) | ORAL | 0 refills | Status: AC
Start: 2020-08-15 — End: 2020-08-22

## 2020-08-15 NOTE — Patient Instructions (Addendum)
.   Please review the attached list of medications and notify my office if there are any errors.   . Please bring all of your medications to every appointment so we can make sure that our medication list is the same as yours.    Check with your insurance company to see if they cover Wegovy or Saxenda for weight loss.    Start using your CPAP every night for awhile to help with daytime sleepiness.

## 2020-08-15 NOTE — Progress Notes (Signed)
Established patient visit   Patient: Brittany Benton   DOB: 04-19-1955   65 y.o. Female  MRN: [id number] Visit Date: 08/15/2020  Today's healthcare provider: Lelon Huh, MD   Chief Complaint  Patient presents with  . Hypothyroidism  . Sinus Problem   Subjective    HPI  Follow up for thyroid  Patient had labs done 11 days ago. Labs showed an elevated TSH level. Patient was advised to recheck in 4-5 months.  Lab Results  Component Value Date   TSH 7.250 (H) 08/04/2020    Patient is here today to discuss Thyroid levels. She reports she feels exhausted all the time. Fatigue has been ongoing for 6 months or more. She describes fatigue and excessive sleepiness and exhaustion. She gets an average of 7hours  of sleep each night. 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 She does still have CPAP machine.   Wt Readings from Last 5 Encounters:  08/15/20 208 lb 6.4 oz (94.5 kg)  06/01/20 213 lb (96.6 kg)  05/15/19 208 lb (94.3 kg)  08/07/17 159 lb (72.1 kg)  08/28/16 154 lb 8 oz (70.1 kg)      Sinus Congestion: Patient complains of sinus and chest congestion for the past 3 weeks. She also has a productive cough with yellow-green sputum and post nasal drainage. She denies any fever, body aches, shortness of breath , headaches, chills or sweats. She has tried taking Zyrtec with some improvemnt. Patient states that the congestion and cough is still lingering.       Medications: Outpatient Medications Prior to Visit  Medication Sig  . atorvastatin (LIPITOR) 20 MG tablet TAKE 1 TABLET BY MOUTH EVERY EVENING  . Cholecalciferol (VITAMIN D-3) 1000 units CAPS Take 1 capsule by mouth daily.  Marland Kitchen imipramine (TOFRANIL) 50 MG tablet Take 1 tablet (50 mg total) by mouth 2 (two) times daily.  . metoprolol succinate (TOPROL-XL) 50 MG 24 hr tablet Take 1 tablet (50 mg total) by mouth daily. Take with or  immediately following a meal.  . Multiple Vitamins-Minerals (MULTIVITAMIN ADULT PO) Take 1 tablet by mouth daily.  Marland Kitchen omeprazole (PRILOSEC) 20 MG capsule TAKE 1 CAPSULE BY MOUTH ONCE DAILY  . sertraline (ZOLOFT) 50 MG tablet TAKE ONE AND A HALF TABLETS BY MOUTH EVERY DAY  . [DISCONTINUED] Wheat Dextrin (BENEFIBER DRINK MIX PO) Take 10 mLs by mouth 3 (three) times daily as needed. (Patient not taking: Reported on 08/15/2020)   No facility-administered medications prior to visit.    Review of Systems  Constitutional: Positive for fatigue. Negative for appetite change, chills and fever.  HENT: Positive for congestion (sinus and chest congestion), postnasal drip and sinus pressure. Negative for sore throat.   Respiratory: Positive for cough (productive). Negative for chest tightness and shortness of breath.   Cardiovascular: Negative for chest pain and palpitations.  Gastrointestinal: Negative for abdominal pain, nausea and vomiting.  Neurological: Negative for dizziness and weakness.       Objective    BP 119/82 (BP Location: Left Arm, Patient Position: Sitting, Cuff Size: Normal)   Pulse 87   Temp (!) 97.4 F (36.3 C) (Temporal)   Resp 18   Wt 208 lb 6.4 oz (94.5 kg)   BMI 40.70 kg/m     Physical Exam   General: Appearance:    Obese female in no acute distress  Eyes:    PERRL, conjunctiva/corneas clear, EOM's  intact       Lungs:     Clear to auscultation bilaterally, respirations unlabored  Heart:    Normal heart rate. Normal rhythm. No murmurs, rubs, or gallops.   MS:   All extremities are intact.   Neurologic:   Awake, alert, oriented x 3. No apparent focal neurological           defect.         Assessment & Plan     1. Hypothyroidism, unspecified type - levothyroxine (SYNTHROID) 50 MCG tablet; Take 1 tablet (50 mcg total) by mouth daily.  Dispense: 30 tablet; Refill: 1   Follow up to check TSH in 6 weeks.   2. Other fatigue Multifactorial.   3. OSA (obstructive sleep  apnea) Has been off CPAP for several years due to losing weight, but weigh is now gone back up and encouraged her to start back on CPAP  4. Sinusitis, unspecified chronicity, unspecified location  - amoxicillin (AMOXIL) 500 MG capsule; Take 2 capsules (1,000 mg total) by mouth 2 (two) times daily for 7 days.  Dispense: 28 capsule; Refill: 0  Other orders   No follow-ups on file.         Lelon Huh, MD  [facility] [phone] (phone) [phone] (fax)  [place]

## 2020-09-20 ENCOUNTER — Other Ambulatory Visit: Payer: Self-pay | Admitting: Family Medicine

## 2020-09-30 ENCOUNTER — Other Ambulatory Visit: Payer: Self-pay

## 2020-09-30 ENCOUNTER — Encounter: Payer: Self-pay | Admitting: Family Medicine

## 2020-09-30 ENCOUNTER — Ambulatory Visit (INDEPENDENT_AMBULATORY_CARE_PROVIDER_SITE_OTHER): Payer: Medicare Other | Admitting: Family Medicine

## 2020-09-30 VITALS — BP 120/82 | HR 82 | Temp 97.6°F | Resp 18 | Wt 205.0 lb

## 2020-09-30 DIAGNOSIS — E039 Hypothyroidism, unspecified: Secondary | ICD-10-CM | POA: Diagnosis not present

## 2020-09-30 DIAGNOSIS — I10 Essential (primary) hypertension: Secondary | ICD-10-CM | POA: Diagnosis not present

## 2020-09-30 NOTE — Progress Notes (Signed)
Established patient visit   Patient: Brittany Benton   DOB: 06/29/1955   65 y.o. Female  MRN: [id number] Visit Date: 09/30/2020  Today's healthcare provider: Lelon Huh, MD   Chief Complaint  Patient presents with   Hypothyroidism   Hypertension   Panic Attack   Subjective    HPI  Hypothyroid, follow-up  Lab Results  Component Value Date   TSH 7.250 (H) 08/04/2020   TSH 4.000 05/15/2019   TSH 3.410 09/29/2014   Wt Readings from Last 3 Encounters:  09/30/20 205 lb (93 kg)  08/15/20 208 lb 6.4 oz (94.5 kg)  06/01/20 213 lb (96.6 kg)    She was last seen for hypothyroid on 08/15/2020.   Management since that visit includes starting Levothyroxine 45mcg daily. She reports good compliance with treatment. She is not having side effects. States she started feeling better and improved energy within a few weeks of starting medication.   Symptoms: No change in energy level No constipation  No diarrhea No heat / cold intolerance  No nervousness No palpitations  No weight changes    -----------------------------------------------------------------------------------------   Hypertension, follow-up  BP Readings from Last 3 Encounters:  09/30/20 120/82  08/15/20 119/82  06/01/20 131/85   Wt Readings from Last 3 Encounters:  09/30/20 205 lb (93 kg)  08/15/20 208 lb 6.4 oz (94.5 kg)  06/01/20 213 lb (96.6 kg)     She was last seen for hypertension 4 months ago.  BP at that visit was 131/85. Management since that visit includes continuing same medication.  She reports good compliance with treatment. She is not having side effects.  She is following a Regular diet. She is not exercising. She does not smoke.  Use of agents associated with hypertension: none.   Outside blood pressures are not checked. Symptoms: No chest pain No chest pressure  No palpitations No syncope  No dyspnea No orthopnea  No paroxysmal nocturnal dyspnea No lower extremity edema    Pertinent labs: Lab Results  Component Value Date   CHOL 120 08/04/2020   HDL 45 08/04/2020   LDLCALC 55 08/04/2020   TRIG 111 08/04/2020   CHOLHDL 2.7 08/04/2020   Lab Results  Component Value Date   NA 139 08/04/2020   K 3.9 08/04/2020   CREATININE 0.95 08/04/2020   GFRNONAA 52 (L) 05/15/2019   GFRAA 60 05/15/2019   GLUCOSE 92 08/04/2020     The ASCVD Risk score (Goff DC Jr., et al., 2013) failed to calculate for the following reasons:   The valid total cholesterol range is 130 to 320 mg/dL   ---------------------------------------------------------------------------------------------------   Follow up for panic disorder:  The patient was last seen for this 4 months ago. Changes made at last visit include none. Continue Sertraline.   She reports good compliance with treatment. She feels that condition is  controlled . She is not having side effects.   -----------------------------------------------------------------------------------------       Medications: Outpatient Medications Prior to Visit  Medication Sig   atorvastatin (LIPITOR) 20 MG tablet TAKE 1 TABLET BY MOUTH EVERY EVENING   Cholecalciferol (VITAMIN D-3) 1000 units CAPS Take 1 capsule by mouth daily.   imipramine (TOFRANIL) 50 MG tablet Take 1 tablet (50 mg total) by mouth 2 (two) times daily.   levothyroxine (SYNTHROID) 50 MCG tablet Take 1 tablet (50 mcg total) by mouth daily.   metoprolol succinate (TOPROL-XL) 50 MG 24 hr tablet Take 1 tablet (50 mg total) by mouth daily.  Take with or immediately following a meal.   Multiple Vitamins-Minerals (MULTIVITAMIN ADULT PO) Take 1 tablet by mouth daily.   omeprazole (PRILOSEC) 20 MG capsule TAKE 1 CAPSULE BY MOUTH ONCE DAILY   sertraline (ZOLOFT) 50 MG tablet TAKE ONE AND A HALF TABLETS BY MOUTH EVERY DAY   No facility-administered medications prior to visit.    Review of Systems  Constitutional:  Negative for appetite change, chills, fatigue and  fever.  Respiratory:  Negative for chest tightness and shortness of breath.   Cardiovascular:  Negative for chest pain and palpitations.  Gastrointestinal:  Negative for abdominal pain, nausea and vomiting.  Neurological:  Negative for dizziness and weakness.      Objective    BP 120/82 (BP Location: Left Arm, Patient Position: Sitting)   Pulse 82   Temp 97.6 F (36.4 C) (Temporal)   Resp 18   Wt 205 lb (93 kg)   BMI 40.04 kg/m     Physical Exam   General: Appearance:    Obese female in no acute distress  Eyes:    PERRL, conjunctiva/corneas clear, EOM's intact       Lungs:     Clear to auscultation bilaterally, respirations unlabored  Heart:    Normal heart rate. Normal rhythm. No murmurs, rubs, or gallops.   MS:   All extremities are intact.   Neurologic:   Awake, alert, oriented x 3. No apparent focal neurological           defect.         Assessment & Plan     1. Hypothyroidism, unspecified type Doing well with initiation of levothyroxine and generally feeling better.  - T4, free - TSH  2. Morbid obesity ([place]) Eight pound weight loss noted since February. Expect additional weight loss when euthyroid.   3. Essential (primary) hypertension Well controlled.  Continue current medications.          The entirety of the information documented in the History of Present Illness, Review of Systems and Physical Exam were personally obtained by me. Portions of this information were initially documented by the CMA and reviewed by me for thoroughness and accuracy.     Lelon Huh, MD  [facility] [phone] (phone) [phone] (fax)  [place]

## 2020-10-01 ENCOUNTER — Other Ambulatory Visit: Payer: Self-pay | Admitting: Family Medicine

## 2020-10-01 DIAGNOSIS — E039 Hypothyroidism, unspecified: Secondary | ICD-10-CM

## 2020-10-01 LAB — TSH: TSH: 4.23 u[IU]/mL (ref 0.450–4.500)

## 2020-10-01 LAB — T4, FREE: Free T4: 1.21 ng/dL (ref 0.82–1.77)

## 2020-10-01 MED ORDER — LEVOTHYROXINE SODIUM 50 MCG PO TABS
50.0000 ug | ORAL_TABLET | Freq: Every day | ORAL | 2 refills | Status: DC
Start: 2020-10-01 — End: 2021-04-26

## 2020-12-06 ENCOUNTER — Telehealth: Payer: Self-pay

## 2020-12-06 MED ORDER — SEMAGLUTIDE-WEIGHT MANAGEMENT 0.25 MG/0.5ML ~~LOC~~ SOAJ
0.2500 mg | SUBCUTANEOUS | 0 refills | Status: AC
Start: 2020-12-06 — End: 2021-01-03

## 2020-12-06 NOTE — Telephone Encounter (Signed)
Copied from [place] [phone] [id number]. Topic: General - Other >> Dec 06, 2020 11:49 AM Celene Kras wrote: Reason for CRM: Pt calling in regards to the weight loss drug, ozempic, she states that she has contacted her insurance company and they are willing to cover this in injection, or tablets. Pt states that she has spoken with PCP regarding this before and is requesting to have this sent in. Please advise.    [facility] - [place], [place] - [address] [place] [place] [address] Phone: [phone] Fax: [phone] Hours: Not open 24 hours

## 2020-12-08 ENCOUNTER — Telehealth: Payer: Self-pay | Admitting: Family Medicine

## 2020-12-08 NOTE — Telephone Encounter (Signed)
Pt called to report that she needs Prior Authorization for [facility]empic at [facility]

## 2020-12-08 NOTE — Telephone Encounter (Signed)
Patient advised medication was sent to [facility] on 12/06/20.

## 2020-12-08 NOTE — Telephone Encounter (Signed)
Pt is calling checking on status of ozempic. Pt is aware dr Caryn Section not in office today nor friday

## 2020-12-09 ENCOUNTER — Other Ambulatory Visit: Payer: Self-pay | Admitting: Family Medicine

## 2020-12-09 DIAGNOSIS — E785 Hyperlipidemia, unspecified: Secondary | ICD-10-CM

## 2020-12-12 NOTE — Telephone Encounter (Signed)
PA done today. This is the outcome: Information regarding your request This medication or product is on your plan's list of covered drugs. Prior authorization is not required at this time. If your pharmacy has questions regarding the processing of your prescription, please have them call the [facility] pharmacy help desk at ([phone].  Message sent to patient via my chart.

## 2020-12-12 NOTE — Telephone Encounter (Signed)
FYI

## 2020-12-13 ENCOUNTER — Telehealth: Payer: Self-pay

## 2020-12-13 NOTE — Telephone Encounter (Signed)
Copied from [place] [phone]. Topic: General - Other >> Dec 13, 2020 12:21 PM Leward Quan A wrote: Reason for CRM: Amy with [facility] called in needing to speak to Dr Caryn Section or his nurse have some questions about Rx sent on 12/06/20 for Semaglutide-Weight Management 0.25 MG/0.5ML SOAJ. Please contact Amy at [place]

## 2020-12-14 MED ORDER — OZEMPIC (0.25 OR 0.5 MG/DOSE) 2 MG/1.5ML ~~LOC~~ SOPN: 0.2500 mg | PEN_INJECTOR | SUBCUTANEOUS | 0 refills | Status: DC

## 2020-12-14 NOTE — Addendum Note (Signed)
Addended by: Birdie Sons on: 12/14/2020 02:52 PM   Modules accepted: Orders

## 2020-12-14 NOTE — Telephone Encounter (Signed)
I called [facility] to inquires about the medication concerns and was advised that the issue has been resolved.

## 2020-12-23 ENCOUNTER — Other Ambulatory Visit: Payer: Self-pay | Admitting: Family Medicine

## 2020-12-23 DIAGNOSIS — E785 Hyperlipidemia, unspecified: Secondary | ICD-10-CM

## 2021-01-15 ENCOUNTER — Telehealth: Payer: Self-pay | Admitting: Family Medicine

## 2021-01-15 NOTE — Telephone Encounter (Signed)
Patient was started on 0.25mg  Semaglutide injections in August. Please see if she is having any trouble with medication. If tolerating can send in prescription to increase to the 0.5mg  dose for the next 4 weeks.

## 2021-01-16 NOTE — Telephone Encounter (Signed)
Tried calling; no answer.  PEC please advise as below.    Thanks,   -Mickel Baas

## 2021-01-27 ENCOUNTER — Other Ambulatory Visit: Payer: Self-pay | Admitting: Family Medicine

## 2021-01-28 NOTE — Telephone Encounter (Signed)
Requested Prescriptions  Pending Prescriptions Disp Refills  . OZEMPIC, 0.25 OR 0.5 MG/DOSE, 2 MG/1.5ML SOPN [Pharmacy Med Name: OZEMPIC (0.25 OR 0.5 MG/DOSE) 2 MG/] 1.5 mL 3    Sig: INJECT 0.25 MG INTO THE SKIN ONCE A WEEKFOR 4 WEEKS, THEN INJECT 0.5 MG ONCE A WEEK AND CONTINUE AT THIS DOSE.     Endocrinology:  Diabetes - GLP-1 Receptor Agonists Passed - 01/27/2021  1:49 PM      Passed - HBA1C is between 0 and 7.9 and within 180 days    Hgb A1c MFr Bld  Date Value Ref Range Status  08/04/2020 5.7 (H) 4.8 - 5.6 % Final    Comment:             Prediabetes: 5.7 - 6.4          Diabetes: >6.4          Glycemic control for adults with diabetes: <7.0          Passed - Valid encounter within last 6 months    Recent Outpatient Visits          4 months ago Hypothyroidism, unspecified type   [facility] Birdie Sons, MD   5 months ago Hypothyroidism, unspecified type   [facility] Birdie Sons, MD   8 months ago Essential (primary) hypertension   [facility] Birdie Sons, MD   1 year ago Sibley, Donald E, MD   1 year ago Erroneous encounter - disregard   [facility] Birdie Sons, MD

## 2021-02-22 ENCOUNTER — Other Ambulatory Visit: Payer: Self-pay | Admitting: Family Medicine

## 2021-02-22 ENCOUNTER — Telehealth: Payer: Self-pay | Admitting: Family Medicine

## 2021-02-22 MED ORDER — SEMAGLUTIDE (1 MG/DOSE) 4 MG/3ML ~~LOC~~ SOPN: 1.0000 mg | PEN_INJECTOR | SUBCUTANEOUS | 12 refills | Status: DC

## 2021-02-22 MED ORDER — OZEMPIC (0.25 OR 0.5 MG/DOSE) 2 MG/1.5ML ~~LOC~~ SOPN: PEN_INJECTOR | SUBCUTANEOUS | 3 refills | Status: DC

## 2021-02-22 NOTE — Telephone Encounter (Signed)
[facility] faxed refill request for the following medications:  OZEMPIC, 0.25 OR 0.5 MG/DOSE, 2 MG/1.5ML SOPN   Please advise.

## 2021-02-28 ENCOUNTER — Other Ambulatory Visit: Payer: Self-pay | Admitting: Family Medicine

## 2021-02-28 DIAGNOSIS — F410 Panic disorder [episodic paroxysmal anxiety] without agoraphobia: Secondary | ICD-10-CM

## 2021-04-03 ENCOUNTER — Emergency Department: Payer: Medicare Other

## 2021-04-03 ENCOUNTER — Other Ambulatory Visit: Payer: Self-pay

## 2021-04-03 ENCOUNTER — Ambulatory Visit: Payer: Self-pay | Admitting: *Deleted

## 2021-04-03 DIAGNOSIS — Z79899 Other long term (current) drug therapy: Secondary | ICD-10-CM | POA: Insufficient documentation

## 2021-04-03 DIAGNOSIS — M47816 Spondylosis without myelopathy or radiculopathy, lumbar region: Secondary | ICD-10-CM | POA: Diagnosis not present

## 2021-04-03 DIAGNOSIS — Z85528 Personal history of other malignant neoplasm of kidney: Secondary | ICD-10-CM | POA: Insufficient documentation

## 2021-04-03 DIAGNOSIS — E039 Hypothyroidism, unspecified: Secondary | ICD-10-CM | POA: Diagnosis not present

## 2021-04-03 DIAGNOSIS — R296 Repeated falls: Secondary | ICD-10-CM | POA: Diagnosis not present

## 2021-04-03 DIAGNOSIS — M2578 Osteophyte, vertebrae: Secondary | ICD-10-CM | POA: Diagnosis not present

## 2021-04-03 DIAGNOSIS — G319 Degenerative disease of nervous system, unspecified: Secondary | ICD-10-CM | POA: Diagnosis not present

## 2021-04-03 DIAGNOSIS — Z96641 Presence of right artificial hip joint: Secondary | ICD-10-CM | POA: Insufficient documentation

## 2021-04-03 DIAGNOSIS — Z7901 Long term (current) use of anticoagulants: Secondary | ICD-10-CM | POA: Insufficient documentation

## 2021-04-03 DIAGNOSIS — R269 Unspecified abnormalities of gait and mobility: Secondary | ICD-10-CM | POA: Insufficient documentation

## 2021-04-03 DIAGNOSIS — I10 Essential (primary) hypertension: Secondary | ICD-10-CM | POA: Diagnosis not present

## 2021-04-03 DIAGNOSIS — M25559 Pain in unspecified hip: Secondary | ICD-10-CM | POA: Diagnosis not present

## 2021-04-03 LAB — COMPREHENSIVE METABOLIC PANEL
ALT: 10 U/L (ref 0–44)
AST: 21 U/L (ref 15–41)
Albumin: 4.2 g/dL (ref 3.5–5.0)
Alkaline Phosphatase: 100 U/L (ref 38–126)
Anion gap: 9 (ref 5–15)
BUN: 11 mg/dL (ref 8–23)
CO2: 26 mmol/L (ref 22–32)
Calcium: 9.4 mg/dL (ref 8.9–10.3)
Chloride: 97 mmol/L — ABNORMAL LOW (ref 98–111)
Creatinine, Ser: 0.93 mg/dL (ref 0.44–1.00)
GFR, Estimated: >60 mL/min (ref >60)
Glucose, Bld: 94 mg/dL (ref 70–99)
Potassium: 4.5 mmol/L (ref 3.5–5.1)
Sodium: 132 mmol/L — ABNORMAL LOW (ref 135–145)
Total Bilirubin: 1.1 mg/dL (ref 0.3–1.2)
Total Protein: 7.8 g/dL (ref 6.5–8.1)

## 2021-04-03 LAB — DIFFERENTIAL
Abs Immature Granulocytes: 0.02 10*3/uL (ref 0.00–0.07)
Basophils Absolute: 0.1 10*3/uL (ref 0.0–0.1)
Basophils Relative: 1 %
Eosinophils Absolute: 0.1 10*3/uL (ref 0.0–0.5)
Eosinophils Relative: 1 %
Immature Granulocytes: 0 %
Lymphocytes Relative: 25 %
Lymphs Abs: 1.9 10*3/uL (ref 0.7–4.0)
Monocytes Absolute: 1 10*3/uL (ref 0.1–1.0)
Monocytes Relative: 14 %
Neutro Abs: 4.4 10*3/uL (ref 1.7–7.7)
Neutrophils Relative %: 59 %

## 2021-04-03 LAB — CBC
HCT: 41.7 % (ref 36.0–46.0)
Hemoglobin: 13 g/dL (ref 12.0–15.0)
MCH: 23.9 pg — ABNORMAL LOW (ref 26.0–34.0)
MCHC: 31.2 g/dL (ref 30.0–36.0)
MCV: 76.5 fL — ABNORMAL LOW (ref 80.0–100.0)
Platelets: 354 10*3/uL (ref 150–400)
RBC: 5.45 MIL/uL — ABNORMAL HIGH (ref 3.87–5.11)
RDW: 15 % (ref 11.5–15.5)
WBC: 7.5 10*3/uL (ref 4.0–10.5)
nRBC: 0 % (ref 0.0–0.2)

## 2021-04-03 LAB — PROTIME-INR
INR: 0.9 (ref 0.8–1.2)
Prothrombin Time: 12.4 seconds (ref 11.4–15.2)

## 2021-04-03 LAB — APTT: aPTT: 43 seconds — ABNORMAL HIGH (ref 24–36)

## 2021-04-03 NOTE — Telephone Encounter (Signed)
°  Chief Complaint: frequent fall x 2 in the past 5 days requesting appt .  Symptoms: difficulty walking and moving without physical assist from another person. Can move legs and arms but difficulty lifting foot and legs to ever step up on a curb.  Frequency: worsening x 6 months  and falls x 2 in the past 5 days  Pertinent Negatives: Patient denies inability to move legs.  Disposition: [x] ED /[] Urgent Care (no appt availability in office) / [] Appointment(In office/virtual)/ []  [facility]/ [] Home Care/ [] Refused Recommended Disposition  Additional Notes:  Recommended ED due to progressive weakness and unable to walk and stand without assist. C/o slid off of bed last night scrapping back and legs. Difficulty raising buttocks. Anxiety reported from patient when NT recommended ED and to call EMS for transportation.  Patient refused and FC contacted and recommended ED as well. Patient agreed at this time. Unsure of disposition.     Reason for Disposition  [1] MODERATE weakness (i.e., interferes with work, school, normal activities) AND [2] new-onset or worsening  Answer Assessment - Initial Assessment Questions 1. MECHANISM: "How did the fall happen?"     Last night  2. DOMESTIC VIOLENCE AND ELDER ABUSE SCREENING: "Did you fall because someone pushed you or tried to hurt you?" If Yes, ask: "Are you safe now?"     *No Answer* 3. ONSET: "When did the fall happen?" (e.g., minutes, hours, or days ago)     Slid off of bed and  4. LOCATION: "What part of the body hit the ground?" (e.g., back, buttocks, head, hips, knees, hands, head, stomach)     Back and legs  5. INJURY: "Did you hurt (injure) yourself when you fell?" If Yes, ask: "What did you injure? Tell me more about this?" (e.g., body area; type of injury; pain severity)"     *No Answer* 6. PAIN: "Is there any pain?" If Yes, ask: "How bad is the pain?" (e.g., Scale 1-10; or mild,  moderate, severe)   - NONE (0): No pain   - MILD  (1-3): Doesn't interfere with normal activities    - MODERATE (4-7): Interferes with normal activities or awakens from sleep    - SEVERE (8-10): Excruciating pain, unable to do any normal activities      *No Answer* 7. SIZE: For cuts, bruises, or swelling, ask: "How large is it?" (e.g., inches or centimeters)      *No Answer* 8. PREGNANCY: "Is there any chance you are pregnant?" "When was your last menstrual period?"     na 9. OTHER SYMPTOMS: "Do you have any other symptoms?" (e.g., dizziness, fever, weakness; new onset or worsening).      *No Answer* 10. CAUSE: "What do you think caused the fall (or falling)?" (e.g., tripped, dizzy spell)       Body not strong enough to hold self up  Protocols used: Falls and [facility]

## 2021-04-03 NOTE — ED Triage Notes (Signed)
Pt to ED for several falls over the past 6 months. Unsure of hitting head with falls. States has been falling from balance difficulties.  Denies injuries.

## 2021-04-04 ENCOUNTER — Emergency Department
Admission: EM | Admit: 2021-04-04 | Discharge: 2021-04-04 | Disposition: A | Discharge status: Home / Self Care | Payer: Medicare Other | Attending: Emergency Medicine | Admitting: Emergency Medicine

## 2021-04-04 ENCOUNTER — Emergency Department: Payer: Medicare Other

## 2021-04-04 DIAGNOSIS — M47816 Spondylosis without myelopathy or radiculopathy, lumbar region: Secondary | ICD-10-CM | POA: Diagnosis not present

## 2021-04-04 DIAGNOSIS — R2681 Unsteadiness on feet: Secondary | ICD-10-CM

## 2021-04-04 LAB — T4, FREE: Free T4: 1.54 ng/dL — ABNORMAL HIGH (ref 0.61–1.12)

## 2021-04-04 LAB — TSH: TSH: 3.081 u[IU]/mL (ref 0.350–4.500)

## 2021-04-04 MED ORDER — DIAZEPAM 5 MG/ML IJ SOLN
2.0000 mg | Freq: Once | INTRAMUSCULAR | Status: AC
  Administered 2021-04-04: 02:00:00 2 mg via INTRAVENOUS
  Filled 2021-04-04: qty 2

## 2021-04-04 NOTE — ED Notes (Signed)
Pt taken to MRI at this time

## 2021-04-04 NOTE — ED Notes (Signed)
Pt states leg weakness and feeling of legs giving out when standing.

## 2021-04-04 NOTE — Discharge Instructions (Signed)
Please use walker to help you balance as you walk.  Follow-up with neurology for further evaluation of unsteady gait and memory issues.  Follow-up with neurosurgery if you ever experience issues with intractable headaches, vision changes or seizures, or if you have low back pain with associated leg numbness/tingling.  Return to the ER for worsening symptoms, persistent vomiting, lethargy or other concerns.

## 2021-04-04 NOTE — Telephone Encounter (Signed)
Pt went to the ER today.  Thanks,   -Mickel Baas

## 2021-04-04 NOTE — ED Notes (Signed)
Pt resting in bed at this time, alert, oriented and talking to this RN.

## 2021-04-04 NOTE — Telephone Encounter (Signed)
She needs to go to ED to have that evaluated.

## 2021-04-04 NOTE — ED Provider Notes (Signed)
[facility] Emergency Department Provider Note   ____________________________________________   Event Date/Time   First MD Initiated Contact with Patient 04/04/21 [phone]     (approximate)  I have reviewed the triage vital signs and the nursing notes.   HISTORY  Chief Complaint Fall    HPI Brittany Benton is a 65 y.o. female who presents to the ED from home with a chief complaint of gait instability and recurrent falls.  Patient reports several falls over the past 6 months.  Most recently she has had 2 falls within the past 5 days.  States sometimes she feels off balance/dizzy, other times she trips, other times she leans to her side and falls.  Denies LOC.  Denies injury from falls.  States sometimes she is unable to get up from the falls because she feels like both legs are weak.  Denies back pain.  Denies recent fever, cough, chest pain, shortness of breath, abdominal pain, nausea, vomiting, diarrhea.  Denies slurred speech, altered mentation, facial droop, unilateral extremity weakness/numbness/tingling.  No family history of MS or neurodegenerative disease.  Also notes her memory has been increasingly worse.  Mother with Alzheimer's dementia.      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   GERD (gastroesophageal reflux disease)    Hernia, incisional    after renal surgery   History of chicken pox    Hyperlipidemia    Renal cell carcinoma ([place])    s/p right nephrectomy 1994   Sleep apnea     Patient Active Problem List   Diagnosis Date Noted   Hypothyroidism 08/15/2020   Osteopenia 06/14/2020   H/O gastric bypass 08/28/2016   Status post total hip replacement, 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   Osteoarthritis of right hip 03/26/2016   Status post bariatric surgery 03/26/2016   Sinus tachycardia 08/01/2015   Abnormal EKG 08/01/2015   Gastroesophageal reflux  disease 06/08/2015   Malaise 06/08/2015   Morbid obesity ([place]) 06/08/2015   OSA (obstructive sleep apnea) 06/08/2015   Arthritis of hip 03/23/2015   Left knee pain 02/15/2015   Vitamin D deficiency 09/29/2014   Lump of right breast 09/22/2014   Prediabetes 09/22/2014   Pruritic dermatitis 09/22/2014   Obstructive sleep apnea 09/22/2014   Allergic rhinitis 09/15/2009   Hypersomnia 09/14/2009   Panic disorder 08/06/2008   Essential (primary) hypertension 07/16/2007   Hyperlipidemia 07/16/2007    Past Surgical History:  Procedure Laterality Date   APPENDECTOMY  1994   BREAST BIOPSY Right 1991   Negative   CHOLECYSTECTOMY  1994   LAPAROSCOPIC GASTRIC SLEEVE RESECTION  02/02/2016   Dr Duke Salvia at [place]   Renal Cell Carcinoma   parotid gland removal  1990   also removed a tumor   TONSILLECTOMY     TOTAL HIP ARTHROPLASTY Right 08/08/2016   Procedure: TOTAL HIP ARTHROPLASTY;  Surgeon: Earnestine Leys, MD;  Location: [facility] ORS;  Service: Orthopedics;  Laterality: Right;    Prior to Admission medications   Medication Sig Start Date End Date Taking? Authorizing Provider  atorvastatin (LIPITOR) 20 MG tablet TAKE 1 TABLET BY MOUTH EVERY EVENING 12/23/20  Yes Birdie Sons, MD  Cholecalciferol (VITAMIN D-3) 1000 units CAPS Take 1 capsule by mouth daily.   Yes [provider]  imipramine (TOFRANIL) 50 MG tablet TAKE ONE TABLET BY MOUTH TWICE DAILY 02/28/21  Yes  Birdie Sons, MD  levothyroxine (SYNTHROID) 50 MCG tablet Take 1 tablet (50 mcg total) by mouth daily. 10/01/20  Yes Birdie Sons, MD  metoprolol succinate (TOPROL-XL) 50 MG 24 hr tablet Take 1 tablet (50 mg total) by mouth daily. Take with or immediately following a meal. 06/01/20  Yes Fisher, Kirstie Peri, MD  Multiple Vitamins-Minerals (MULTIVITAMIN ADULT PO) Take 1 tablet by mouth daily. 07/16/07  Yes [provider]  omeprazole (PRILOSEC) 20 MG capsule TAKE 1 CAPSULE BY MOUTH ONCE DAILY  12/23/20  Yes Birdie Sons, MD  Semaglutide, 1 MG/DOSE, 4 MG/3ML SOPN Inject 1 mg as directed once a week. 02/22/21  Yes Birdie Sons, MD  sertraline (ZOLOFT) 50 MG tablet TAKE ONE AND A HALF TABLETS BY MOUTH EVERY DAY 06/01/20  Yes Birdie Sons, MD    Allergies Codeine, Morphine, Tape, and Tramadol  Family History  Problem Relation Age of Onset   Osteoporosis Mother    Dementia Mother    Heart disease Father    Heart attack Father    Breast cancer Maternal Aunt     Social History Social History   Tobacco Use   Smoking status: Never   Smokeless tobacco: Never  Substance Use Topics   Alcohol use: Not Currently    Alcohol/week: 0.0 standard drinks   Drug use: No    Review of Systems  Constitutional: No fever/chills Eyes: No visual changes. ENT: No sore throat. Cardiovascular: Denies chest pain. Respiratory: Denies shortness of breath. Gastrointestinal: No abdominal pain.  No nausea, no vomiting.  No diarrhea.  No constipation. Genitourinary: Negative for dysuria. Musculoskeletal: Negative for back pain. Skin: Negative for rash. Neurological: Positive for gait instability.  Positive for memory deficits.  Negative for headaches, focal weakness or numbness.   ____________________________________________   PHYSICAL EXAM:  VITAL SIGNS: ED Triage Vitals  Enc Vitals Group     BP 04/03/21 1817 (!) 152/84     Pulse Rate 04/03/21 1817 (!) 102     Resp 04/03/21 1817 17     Temp 04/03/21 1817 97.9 F (36.6 C)     Temp src --      SpO2 04/03/21 1817 100 %     Weight 04/03/21 1818 180 lb (81.6 kg)     Height 04/03/21 1818 5' (1.524 m)     Head Circumference --      Peak Flow --      Pain Score 04/03/21 1818 0     Pain Loc --      Pain Edu? --      Excl. in [place]? --     Constitutional: Alert and oriented. Well appearing and in no acute distress. Eyes: Conjunctivae are normal. PERRL. EOMI. Head: Atraumatic. Nose: Atraumatic. Mouth/Throat: Mucous membranes  are moist.  No dental malocclusion. Neck: No stridor.  No cervical spine tenderness to palpation. Cardiovascular: Normal rate, regular rhythm. Grossly normal heart sounds.  Good peripheral circulation. Respiratory: Normal respiratory effort.  No retractions. Lungs CTAB. Gastrointestinal: Soft and nontender to light or deep palpation. No distention. No abdominal bruits. No CVA tenderness. Musculoskeletal: No lower extremity tenderness nor edema.  No joint effusions. Neurologic: Alert and oriented x3.  CN II to XII grossly intact.  Normal speech and language. No gross focal neurologic deficits are appreciated. MAEx4. Skin:  Skin is warm, dry and intact. No rash noted. Psychiatric: Mood and affect are normal. Speech and behavior are normal.  ____________________________________________   LABS (all labs ordered are listed, but  only abnormal results are displayed)  Labs Reviewed  APTT - Abnormal; Notable for the following components:      Result Value   aPTT 43 (*)    All other components within normal limits  CBC - Abnormal; Notable for the following components:   RBC 5.45 (*)    MCV 76.5 (*)    MCH 23.9 (*)    All other components within normal limits  COMPREHENSIVE METABOLIC PANEL - Abnormal; Notable for the following components:   Sodium 132 (*)    Chloride 97 (*)    All other components within normal limits  T4, FREE - Abnormal; Notable for the following components:   Free T4 1.54 (*)    All other components within normal limits  PROTIME-INR  DIFFERENTIAL  TSH   ____________________________________________  EKG  ED ECG REPORT I, Breonna Gafford J, the attending physician, personally viewed and interpreted this ECG.   Date: 04/04/2021  EKG Time: 1826  Rate: 98  Rhythm: normal sinus rhythm  Axis: Normal  Intervals:none  ST&T Change: Nonspecific  ____________________________________________  RADIOLOGY I, Zylee Marchiano J, personally viewed and evaluated these images (plain  radiographs) as part of my medical decision making, as well as reviewing the written report by the radiologist.  ED MD interpretation: No ICH, no cervical spine osseous injury, degenerative changes noted  Official radiology report(s): CT HEAD WO CONTRAST  Result Date: 04/03/2021 CLINICAL DATA:  Several falls over the past 6 months, initial encounter EXAM: CT HEAD WITHOUT CONTRAST TECHNIQUE: Contiguous axial images were obtained from the base of the skull through the vertex without intravenous contrast. COMPARISON:  None. FINDINGS: Brain: No evidence of acute infarction, hemorrhage, hydrocephalus, extra-axial collection or mass lesion/mass effect. Mild atrophic changes are noted commensurate with the patient's given age. Vascular: No hyperdense vessel or unexpected calcification. Skull: Normal. Negative for fracture or focal lesion. Sinuses/Orbits: No acute finding. Other: None. IMPRESSION: Chronic atrophic changes without acute abnormality. Electronically Signed   By: Inez Catalina M.D.   On: 04/03/2021 19:09   CT Cervical Spine Wo Contrast  Result Date: 04/03/2021 CLINICAL DATA:  Multiple falls. EXAM: CT CERVICAL SPINE WITHOUT CONTRAST TECHNIQUE: Multidetector CT imaging of the cervical spine was performed without intravenous contrast. Multiplanar CT image reconstructions were also generated. COMPARISON:  None. FINDINGS: Alignment: There is approximately 1 mm anterolisthesis of the L3 vertebral body on L4. Skull base and vertebrae: No acute fracture. No primary bone lesion or focal pathologic process. Soft tissues and spinal canal: No prevertebral fluid or swelling. No visible canal hematoma. Disc levels: Mild to moderate severity endplate sclerosis and mild anterior osteophyte formation are seen at the levels of C4-C5, C5-C6 and C6-C7. Mild anterior osteophyte formation is also seen at the level of C3-C4. Moderate severity multilevel degenerative changes are seen at the levels of C4-C5, C5-C6 and  C6-C7. Mild, bilateral multilevel facet joint hypertrophy is noted. Upper chest: Negative. Other: None. IMPRESSION: 1. Moderate severity multilevel degenerative changes, most prominent at the levels of C4-C5, C5-C6 and C6-C7. 2. No evidence of an acute fracture or subluxation. Electronically Signed   By: Virgina Norfolk M.D.   On: 04/03/2021 20:21   MR BRAIN WO CONTRAST  Result Date: 04/04/2021 CLINICAL DATA:  Initial evaluation for acute altered mental status. EXAM: MRI HEAD WITHOUT CONTRAST TECHNIQUE: Multiplanar, multiecho pulse sequences of the brain and surrounding structures were obtained without intravenous contrast. COMPARISON:  CT from 04/03/2021. FINDINGS: Brain: Mild age-related cerebral atrophy. Scattered patchy T2/FLAIR hyperintensity involving the periventricular and deep  white matter both cerebral hemispheres most consistent with chronic small vessel ischemic disease, mild in nature. Mild patchy involvement of the pons noted. No abnormal foci of restricted diffusion to suggest acute or subacute ischemia. Gray-white matter differentiation maintained. No encephalomalacia to suggest chronic cortical infarction. No acute or chronic intracranial hemorrhage. 1.1 cm extra-axial mass overlying the left frontal convexity most consistent with a small meningioma (series 15, image 30). No associated mass effect or edema. No other mass lesion. No midline shift or hydrocephalus. No extra-axial fluid collection. Pituitary gland suprasellar region within normal limits. Midline structures intact. Vascular: Major intracranial vascular flow voids are well maintained. Skull and upper cervical spine: Craniocervical junction within normal limits. Bone marrow signal intensity normal. No scalp soft tissue abnormality. Sinuses/Orbits: Globes and orbital soft tissues within normal limits. Paranasal sinuses are clear. No mastoid effusion. Inner ear structures grossly normal. Other: Right parotid is absent and/or  atrophic. IMPRESSION: 1. No acute intracranial abnormality. 2. Mild age-related cerebral atrophy with chronic small vessel ischemic disease. 3. 1.1 cm meningioma overlying the left frontal convexity without associated mass effect or edema. Electronically Signed   By: Jeannine Boga M.D.   On: 04/04/2021 03:00   MR LUMBAR SPINE WO CONTRAST  Result Date: 04/04/2021 CLINICAL DATA:  Initial evaluation for bilateral lower extremity weakness. EXAM: MRI LUMBAR SPINE WITHOUT CONTRAST TECHNIQUE: Multiplanar, multisequence MR imaging of the lumbar spine was performed. No intravenous contrast was administered. COMPARISON:  None available. FINDINGS: Segmentation: Standard. Lowest well-formed disc space labeled the L5-S1 level. Alignment: Trace 2 mm anterolisthesis of L4 on L5, chronic and facet mediated. Alignment otherwise normal preservation of the normal lumbar lordosis. Vertebrae: Vertebral body height maintained without acute or chronic fracture. Bone marrow signal intensity somewhat heterogeneous but overall within normal limits. 1.2 cm T2/stir hyperintense lesion within the T12 vertebral body favored to reflect a atypical hemangioma. No other discrete or worrisome osseous lesions. No other abnormal marrow edema. Conus medullaris and cauda equina: Conus extends to the L1 level. Conus and cauda equina appear normal. Paraspinal and other soft tissues: Paraspinous soft tissues within normal limits. Right kidney appears to be absent. Visualized visceral structures otherwise unremarkable. Disc levels: L1-2: Normal interspace. Mild facet hypertrophy, greater on the right. No canal or foraminal stenosis. L2-3: Mild disc bulge with disc desiccation. Superimposed small left foraminal to extraforaminal disc protrusion closely approximates the exiting left L2 nerve root (series 9, image 19). Mild facet hypertrophy. No significant spinal stenosis. Foramina remain patent. L3-4: Mild intervertebral disc space narrowing with  diffuse disc bulge and disc desiccation. Mild to moderate facet hypertrophy. Resultant mild spinal stenosis with mild left lateral recess narrowing. Foramina remain patent. L4-5: Trace anterolisthesis. Mild disc bulge with disc desiccation. Moderate bilateral facet arthrosis, slightly worse on the right. Resultant mild canal with right worse than left lateral recess stenosis. Foramina remain patent. L5-S1: Disc desiccation. Superimposed tiny central disc protrusion with slight superior migration. Moderate bilateral facet hypertrophy. No canal or lateral recess stenosis. Foramina remain patent. IMPRESSION: 1. No acute abnormality within the lumbar spine. No cord compression or high-grade spinal stenosis. 2. Degenerative spondylosis at L3-4 and L4-5 with resultant mild canal and bilateral lateral recess stenosis as above. No frank neural impingement. 3. Small left foraminal to extraforaminal disc protrusion at L2-3, closely approximating and potentially irritating the exiting left L2 nerve root. 4. Tiny central disc protrusion at L5-S1 without significant stenosis or impingement. Electronically Signed   By: Jeannine Boga M.D.   On: 04/04/2021 03:08  ____________________________________________   PROCEDURES  Procedure(s) performed (including Critical Care):  Procedures   ____________________________________________   INITIAL IMPRESSION / ASSESSMENT AND PLAN / ED COURSE  As part of my medical decision making, I reviewed the following data within the [place] History obtained from family, Nursing notes reviewed and incorporated, Labs reviewed, Old chart reviewed, and Notes from prior ED visits     65 year old female presenting with a 6-month history of gait instability and recurrent falls.  Differential diagnosis includes but is not limited to CVA, metabolic derangement, disc herniation, neurodegenerative disease, etc.  Patient has not seen her PCP for this problem and  called them today who directed her to the ED for evaluation.  Updated patient and her spouse on CT imaging studies and laboratory findings; will add thyroid panel.  Offered MRI brain and lumbar spine to which patient is agreeable.  Clinical Course as of 04/04/21 0412  Tue Apr 04, 2021  4540 Patient resting in no acute distress.  Updated her on all laboratory and imaging results.  Will refer her to neurology for outpatient follow-up.  We will also provide contact information for neurosurgery clinic should she start suffering from back pain due to herniated disc or intractable headaches from meningioma.  Will prescribe rolling walker for her to use at home.  Strict return precautions given.  Patient verbalizes understanding agrees to plan of care [JS]    Clinical Course User Index [JS] Paulette Blanch, MD     ____________________________________________   FINAL CLINICAL IMPRESSION(S) / ED DIAGNOSES  Final diagnoses:  Gait instability     ED Discharge Orders     None        Note:  This document was prepared using Dragon voice recognition software and may include unintentional dictation errors.    Paulette Blanch, MD 04/04/21 [phone]

## 2021-04-05 ENCOUNTER — Telehealth: Payer: Self-pay

## 2021-04-05 ENCOUNTER — Other Ambulatory Visit: Payer: Self-pay | Admitting: Family Medicine

## 2021-04-05 DIAGNOSIS — D329 Benign neoplasm of meninges, unspecified: Secondary | ICD-10-CM | POA: Insufficient documentation

## 2021-04-05 DIAGNOSIS — D496 Neoplasm of unspecified behavior of brain: Secondary | ICD-10-CM

## 2021-04-05 NOTE — Telephone Encounter (Signed)
Copied from [place] [phone]. Topic: Referral - Question >> Apr 05, 2021 11:14 AM Antonieta Iba C wrote: Reason for CRM: pt called in for assistance, pt says that she was seen at the hospital and Dx with a brain tumor and also 3 hernia disk. Pt says that she was told to follow up with Dr. Manuella Ghazi at [facility]. Pt says that she called [facility] and was told the first opening is in February. Pt says that she was told to see provider within 1 week. Pt would like to know if there is anything that her PCP could do to help her get in with provider sooner?    Please assist further.

## 2021-04-07 ENCOUNTER — Telehealth: Payer: Self-pay

## 2021-04-07 NOTE — Telephone Encounter (Signed)
Copied from [place] [phone]. Topic: General - Other >> Apr 07, 2021 10:22 AM McGill, Nelva Bush wrote: Reason for CRM: Pt is requesting handicap plaque from PCP. Stated she has started to use her walker again.   Please advise.

## 2021-04-12 NOTE — Telephone Encounter (Signed)
I have called [facility] and asked that pt be seen sooner. They said they would put her on cancellation list. I have also let pt know if she will see a PA she could be seen sooner

## 2021-04-12 NOTE — Telephone Encounter (Signed)
Pt is wanting to know if paperwork is ready for her to pick up. Please advise pt

## 2021-04-13 ENCOUNTER — Other Ambulatory Visit: Payer: Self-pay | Admitting: Family Medicine

## 2021-04-13 DIAGNOSIS — D496 Neoplasm of unspecified behavior of brain: Secondary | ICD-10-CM

## 2021-04-26 ENCOUNTER — Other Ambulatory Visit: Payer: Self-pay | Admitting: Family Medicine

## 2021-04-26 DIAGNOSIS — I10 Essential (primary) hypertension: Secondary | ICD-10-CM

## 2021-04-26 DIAGNOSIS — E039 Hypothyroidism, unspecified: Secondary | ICD-10-CM

## 2021-04-26 DIAGNOSIS — F410 Panic disorder [episodic paroxysmal anxiety] without agoraphobia: Secondary | ICD-10-CM

## 2021-04-26 NOTE — Telephone Encounter (Signed)
Requested Prescriptions  Pending Prescriptions Disp Refills   sertraline (ZOLOFT) 50 MG tablet [Pharmacy Med Name: SERTRALINE HCL 50 MG TAB] 135 tablet 0    Sig: TAKE ONE AND A HALF TABLETS BY MOUTH EVERY DAY     Psychiatry:  Antidepressants - SSRI Failed - 04/26/2021  1:54 PM      Failed - Valid encounter within last 6 months    Recent Outpatient Visits          6 months ago Hypothyroidism, unspecified type   [facility] Birdie Sons, MD   8 months ago Hypothyroidism, unspecified type   [facility] Birdie Sons, MD   10 months ago Essential (primary) hypertension   [facility], Kirstie Peri, MD   1 year ago Prediabetes   [facility] Birdie Sons, MD   1 year ago Erroneous encounter - disregard   [place], MD      Future Appointments            In 3 weeks Caryn Section, Kirstie Peri, MD [facility]            metoprolol succinate (TOPROL-XL) 50 MG 24 hr tablet [Pharmacy Med Name: METOPROLOL SUCCINATE ER 50 MG TAB] 90 tablet 0    Sig: TAKE ONE TABLET BY MOUTH EVERY DAY WITH OR IMMEDIATELY FOLLOWING A MEAL     Cardiovascular:  Beta Blockers Failed - 04/26/2021  1:54 PM      Failed - Valid encounter within last 6 months    Recent Outpatient Visits          6 months ago Hypothyroidism, unspecified type   [facility] Birdie Sons, MD   8 months ago Hypothyroidism, unspecified type   [facility] Birdie Sons, MD   10 months ago Essential (primary) hypertension   [facility], Kirstie Peri, MD   1 year ago [place], Donald E, MD   1 year ago Erroneous encounter - disregard   [facility] Birdie Sons, MD      Future Appointments            In 3 weeks Fisher, Kirstie Peri, MD [facility]           Passed - Last BP in normal  range    BP Readings from Last 1 Encounters:  04/04/21 122/68         Passed - Last Heart Rate in normal range    Pulse Readings from Last 1 Encounters:  04/04/21 88          levothyroxine (SYNTHROID) 50 MCG tablet [Pharmacy Med Name: LEVOTHYROXINE SODIUM 50 MCG TAB] 90 tablet 0    Sig: TAKE ONE TABLET BY MOUTH EVERY DAY     Endocrinology:  Hypothyroid Agents Failed - 04/26/2021  1:54 PM      Failed - TSH needs to be rechecked within 3 months after an abnormal result. Refill until TSH is due.      Passed - TSH in normal range and within 360 days    TSH  Date Value Ref Range Status  04/03/2021 3.081 0.350 - 4.500 uIU/mL Final    Comment:    Performed by a 3rd Generation assay with a functional sensitivity of <=0.01 uIU/mL. Performed at [facility], [address] [place], [place] [address]   09/30/2020 4.230 0.450 - 4.500 uIU/mL Final  Passed - Valid encounter within last 12 months    Recent Outpatient Visits          6 months ago Hypothyroidism, unspecified type   [facility] Birdie Sons, MD   8 months ago Hypothyroidism, unspecified type   [facility] Birdie Sons, MD   10 months ago Essential (primary) hypertension   [facility] Birdie Sons, MD   1 year ago Prediabetes   [facility] Birdie Sons, MD   1 year ago Erroneous encounter - disregard   [place], MD      Future Appointments            In 3 weeks Fisher, Kirstie Peri, MD [facility], [place]

## 2021-05-08 ENCOUNTER — Emergency Department
Admission: EM | Admit: 2021-05-08 | Discharge: 2021-05-08 | Disposition: A | Discharge status: Home / Self Care | Payer: Medicare Other | Attending: Emergency Medicine | Admitting: Emergency Medicine

## 2021-05-08 ENCOUNTER — Encounter: Payer: Self-pay | Admitting: Emergency Medicine

## 2021-05-08 ENCOUNTER — Other Ambulatory Visit: Payer: Self-pay

## 2021-05-08 DIAGNOSIS — S0990XA Unspecified injury of head, initial encounter: Secondary | ICD-10-CM | POA: Diagnosis not present

## 2021-05-08 DIAGNOSIS — W19XXXA Unspecified fall, initial encounter: Secondary | ICD-10-CM | POA: Diagnosis not present

## 2021-05-08 DIAGNOSIS — Y92019 Unspecified place in single-family (private) house as the place of occurrence of the external cause: Secondary | ICD-10-CM | POA: Diagnosis not present

## 2021-05-08 DIAGNOSIS — W109XXA Fall (on) (from) unspecified stairs and steps, initial encounter: Secondary | ICD-10-CM | POA: Insufficient documentation

## 2021-05-08 DIAGNOSIS — S0081XA Abrasion of other part of head, initial encounter: Secondary | ICD-10-CM | POA: Insufficient documentation

## 2021-05-08 DIAGNOSIS — S90512A Abrasion, left ankle, initial encounter: Secondary | ICD-10-CM | POA: Diagnosis not present

## 2021-05-08 DIAGNOSIS — R531 Weakness: Secondary | ICD-10-CM | POA: Diagnosis not present

## 2021-05-08 DIAGNOSIS — T148XXA Other injury of unspecified body region, initial encounter: Secondary | ICD-10-CM

## 2021-05-08 NOTE — ED Provider Notes (Signed)
[facility] Provider Note    Event Date/Time   First MD Initiated Contact with Patient 05/08/21 1711     (approximate)   History   Fall   HPI  Brittany Benton is a 66 y.o. female who presents after a fall.  Patient reports that over the last several months she has had difficulties with balance leading to frequent falls.  She has been seeing neurology for this.  She reports today she had a fall but this time it was outside on the sidewalk.,  She reports a mild head injury.  She denies LOC.  No nausea or vomiting.  No chest pain abdominal pain or back pain.  Not on blood thinners     Physical Exam   Triage Vital Signs: ED Triage Vitals [05/08/21 1706]  Enc Vitals Group     BP (!) 141/89     Pulse Rate 88     Resp 18     Temp (!) 97.4 F (36.3 C)     Temp Source Oral     SpO2 99 %     Weight 82.2 kg (181 lb 3.5 oz)     Height 1.524 m (5')     Head Circumference      Peak Flow      Pain Score 3     Pain Loc      Pain Edu?      Excl. in [place]?     Most recent vital signs: Vitals:   05/08/21 1706 05/08/21 1709  BP: (!) 141/89 (!) 157/85  Pulse: 88 89  Resp: 18 (!) 21  Temp: (!) 97.4 F (36.3 C)   SpO2: 99% 96%     General: Awake, no distress.  CV:  Good peripheral perfusion.  No chest wall tenderness Resp:  Normal effort.  Abd:  No distention.  No abdominal tenderness to palpation Other:  Head: Abrasion to the left forehead, no significant swelling, no pain with axial load of the cervical spine.  No vertebral tenderness to palpation.  Normal range of motion of all extremities.  Minor abrasion on the left ankle ED Results / Procedures / Treatments   Labs (all labs ordered are listed, but only abnormal results are displayed) Labs Reviewed - No data to display   EKG     RADIOLOGY     PROCEDURES:  Critical Care performed:   Procedures   MEDICATIONS ORDERED IN ED: Medications - No data to display   IMPRESSION / MDM /  [place] / ED COURSE  I reviewed the triage vital signs and the nursing notes.  Patient presents after a fall, she has neurology follow-up for work-up for balance issues, this is occurred multiple times over the last several months.  She feels that she was not seriously hurt from the fall today but because it was outside was more concerned  Exam is overall reassuring, she does have an abrasion to the head and foot but these appear minor.  She has been quite stable for over an hour since they occurred.  She is not on blood thinners.  Discussed with her that imaging unlikely to be necessary and she feels comfortable with not doing imaging today.  We had her ambulate with a walker how she typically gets around and she did very well, she feels discharge is appropriate          FINAL CLINICAL IMPRESSION(S) / ED DIAGNOSES   Final diagnoses:  Fall, initial encounter  Injury of head, initial encounter  Abrasion     Rx / DC Orders   ED Discharge Orders     None        Note:  This document was prepared using Dragon voice recognition software and may include unintentional dictation errors.   Lavonia Drafts, MD 05/08/21 [place]

## 2021-05-08 NOTE — ED Notes (Signed)
Patient ambulatory with walker without dizziness or other symptoms.

## 2021-05-08 NOTE — ED Triage Notes (Signed)
Patient to ED via [facility] from home for fall/ weakness. Patient had a mechanical fall at home falling down 2 steps with abrasions on left forehead/nose and pain on the right ankle. No LOC and not on blood thinners. Patient states she has been feeling weak for several weeks now. Pt was diagnosed with a brain tumor in December but has been unable to follow up with neurology. VS within normal limits per EMS. Aox4.

## 2021-05-15 ENCOUNTER — Ambulatory Visit (INDEPENDENT_AMBULATORY_CARE_PROVIDER_SITE_OTHER): Payer: Medicare Other

## 2021-05-15 DIAGNOSIS — Z0000 Encounter for general adult medical examination without abnormal findings: Secondary | ICD-10-CM | POA: Diagnosis not present

## 2021-05-15 NOTE — Patient Instructions (Signed)
Brittany Benton , Thank you for taking time to come for your Medicare Wellness Visit. I appreciate your ongoing commitment to your health goals. Please review the following plan we discussed and let me know if I can assist you in the future.   Screening recommendations/referrals: Colonoscopy: 02/12/11, due but pt declined Mammogram: 06/13/20 Bone Density: 06/13/20 Recommended yearly ophthalmology/optometry visit for glaucoma screening and checkup Recommended yearly dental visit for hygiene and checkup  Vaccinations: Influenza vaccine: n/d Pneumococcal vaccine: n/d Tdap vaccine: 09/29/14 Shingles vaccine: Zostavax 09/29/14   Covid-19:06/12/19, 07/08/19, 04/22/20  Advanced directives: no  Conditions/risks identified: none  Next appointment: Follow up in one year for your annual wellness visit    Preventive Care 35 Years and Older, Female Preventive care refers to lifestyle choices and visits with your health care provider that can promote health and wellness. What does preventive care include? A yearly physical exam. This is also called an annual well check. Dental exams once or twice a year. Routine eye exams. Ask your health care provider how often you should have your eyes checked. Personal lifestyle choices, including: Daily care of your teeth and gums. Regular physical activity. Eating a healthy diet. Avoiding tobacco and drug use. Limiting alcohol use. Practicing safe sex. Taking low-dose aspirin every day. Taking vitamin and mineral supplements as recommended by your health care provider. What happens during an annual well check? The services and screenings done by your health care provider during your annual well check will depend on your age, overall health, lifestyle risk factors, and family history of disease. Counseling  Your health care provider may ask you questions about your: Alcohol use. Tobacco use. Drug use. Emotional well-being. Home and relationship  well-being. Sexual activity. Eating habits. History of falls. Memory and ability to understand (cognition). Work and work Statistician. Reproductive health. Screening  You may have the following tests or measurements: Height, weight, and BMI. Blood pressure. Lipid and cholesterol levels. These may be checked every 5 years, or more frequently if you are over 25 years old. Skin check. Lung cancer screening. You may have this screening every year starting at age 53 if you have a 30-pack-year history of smoking and currently smoke or have quit within the past 15 years. Fecal occult blood test (FOBT) of the stool. You may have this test every year starting at age 59. Flexible sigmoidoscopy or colonoscopy. You may have a sigmoidoscopy every 5 years or a colonoscopy every 10 years starting at age 58. Hepatitis C blood test. Hepatitis B blood test. Sexually transmitted disease (STD) testing. Diabetes screening. This is done by checking your blood sugar (glucose) after you have not eaten for a while (fasting). You may have this done every 1-3 years. Bone density scan. This is done to screen for osteoporosis. You may have this done starting at age 55. Mammogram. This may be done every 1-2 years. Talk to your health care provider about how often you should have regular mammograms. Talk with your health care provider about your test results, treatment options, and if necessary, the need for more tests. Vaccines  Your health care provider may recommend certain vaccines, such as: Influenza vaccine. This is recommended every year. Tetanus, diphtheria, and acellular pertussis (Tdap, Td) vaccine. You may need a Td booster every 10 years. Zoster vaccine. You may need this after age 17. Pneumococcal 13-valent conjugate (PCV13) vaccine. One dose is recommended after age 53. Pneumococcal polysaccharide (PPSV23) vaccine. One dose is recommended after age 56. Talk to your health care  provider about which  screenings and vaccines you need and how often you need them. This information is not intended to replace advice given to you by your health care provider. Make sure you discuss any questions you have with your health care provider. Document Released: 04/29/2015 Document Revised: 12/21/2015 Document Reviewed: 02/01/2015 Elsevier Interactive Patient Education  2017 [place] Prevention in the Home Falls can cause injuries. They can happen to people of all ages. There are many things you can do to make your home safe and to help prevent falls. What can I do on the outside of my home? Regularly fix the edges of walkways and driveways and fix any cracks. Remove anything that might make you trip as you walk through a door, such as a raised step or threshold. Trim any bushes or trees on the path to your home. Use bright outdoor lighting. Clear any walking paths of anything that might make someone trip, such as rocks or tools. Regularly check to see if handrails are loose or broken. Make sure that both sides of any steps have handrails. Any raised decks and porches should have guardrails on the edges. Have any leaves, snow, or ice cleared regularly. Use sand or salt on walking paths during winter. Clean up any spills in your garage right away. This includes oil or grease spills. What can I do in the bathroom? Use night lights. Install grab bars by the toilet and in the tub and shower. Do not use towel bars as grab bars. Use non-skid mats or decals in the tub or shower. If you need to sit down in the shower, use a plastic, non-slip stool. Keep the floor dry. Clean up any water that spills on the floor as soon as it happens. Remove soap buildup in the tub or shower regularly. Attach bath mats securely with double-sided non-slip rug tape. Do not have throw rugs and other things on the floor that can make you trip. What can I do in the bedroom? Use night lights. Make sure that you have a  light by your bed that is easy to reach. Do not use any sheets or blankets that are too big for your bed. They should not hang down onto the floor. Have a firm chair that has side arms. You can use this for support while you get dressed. Do not have throw rugs and other things on the floor that can make you trip. What can I do in the kitchen? Clean up any spills right away. Avoid walking on wet floors. Keep items that you use a lot in easy-to-reach places. If you need to reach something above you, use a strong step stool that has a grab bar. Keep electrical cords out of the way. Do not use floor polish or wax that makes floors slippery. If you must use wax, use non-skid floor wax. Do not have throw rugs and other things on the floor that can make you trip. What can I do with my stairs? Do not leave any items on the stairs. Make sure that there are handrails on both sides of the stairs and use them. Fix handrails that are broken or loose. Make sure that handrails are as long as the stairways. Check any carpeting to make sure that it is firmly attached to the stairs. Fix any carpet that is loose or worn. Avoid having throw rugs at the top or bottom of the stairs. If you do have throw rugs, attach them to the  floor with carpet tape. Make sure that you have a light switch at the top of the stairs and the bottom of the stairs. If you do not have them, ask someone to add them for you. What else can I do to help prevent falls? Wear shoes that: Do not have high heels. Have rubber bottoms. Are comfortable and fit you well. Are closed at the toe. Do not wear sandals. If you use a stepladder: Make sure that it is fully opened. Do not climb a closed stepladder. Make sure that both sides of the stepladder are locked into place. Ask someone to hold it for you, if possible. Clearly mark and make sure that you can see: Any grab bars or handrails. First and last steps. Where the edge of each step  is. Use tools that help you move around (mobility aids) if they are needed. These include: Canes. Walkers. Scooters. Crutches. Turn on the lights when you go into a dark area. Replace any light bulbs as soon as they burn out. Set up your furniture so you have a clear path. Avoid moving your furniture around. If any of your floors are uneven, fix them. If there are any pets around you, be aware of where they are. Review your medicines with your doctor. Some medicines can make you feel dizzy. This can increase your chance of falling. Ask your doctor what other things that you can do to help prevent falls. This information is not intended to replace advice given to you by your health care provider. Make sure you discuss any questions you have with your health care provider. Document Released: 01/27/2009 Document Revised: 09/08/2015 Document Reviewed: 05/07/2014 Elsevier Interactive Patient Education  2017 [employer].

## 2021-05-15 NOTE — Progress Notes (Signed)
Virtual Visit via Telephone Note  I connected with  [name] on 05/15/21 at 11:00 AM EST by telephone and verified that I am speaking with the correct person using two identifiers.  Location: Patient: home  Provider: [facility] Persons participating in the virtual visit: [place]   I discussed the limitations, risks, security and privacy concerns of performing an evaluation and management service by telephone and the availability of in person appointments. The patient expressed understanding and agreed to proceed.  Interactive audio and video telecommunications were attempted between this nurse and patient, however failed, due to patient having technical difficulties OR patient did not have access to video capability.  We continued and completed visit with audio only.  Some vital signs may be absent or patient reported.   Dionisio David, LPN  Subjective:   ROCKY Benton is a 66 y.o. female who presents for Medicare Annual (Subsequent) preventive examination.  Review of Systems           Objective:    There were no vitals filed for this visit. There is no height or weight on file to calculate BMI.  Advanced Directives 05/08/2021 04/03/2021 05/09/2020 08/11/2016 08/08/2016 08/08/2016 07/25/2016  Does Patient Have a Medical Advance Directive? No No No No No No No  Would patient like information on creating a medical advance directive? - - Yes (ED - Information included in AVS) No - Patient declined No - Patient declined - No - Patient declined    Current Medications (verified) Outpatient Encounter Medications as of 05/15/2021  Medication Sig   atorvastatin (LIPITOR) 20 MG tablet TAKE 1 TABLET BY MOUTH EVERY EVENING   Cholecalciferol (VITAMIN D-3) 1000 units CAPS Take 1 capsule by mouth daily.   imipramine (TOFRANIL) 50 MG tablet TAKE ONE TABLET BY MOUTH TWICE DAILY   levothyroxine (SYNTHROID) 50 MCG tablet TAKE ONE TABLET BY MOUTH EVERY DAY   metoprolol succinate  (TOPROL-XL) 50 MG 24 hr tablet TAKE ONE TABLET BY MOUTH EVERY DAY WITH OR IMMEDIATELY FOLLOWING A MEAL   Multiple Vitamins-Minerals (MULTIVITAMIN ADULT PO) Take 1 tablet by mouth daily.   omeprazole (PRILOSEC) 20 MG capsule TAKE 1 CAPSULE BY MOUTH ONCE DAILY   Semaglutide, 1 MG/DOSE, 4 MG/3ML SOPN Inject 1 mg as directed once a week.   sertraline (ZOLOFT) 50 MG tablet TAKE ONE AND A HALF TABLETS BY MOUTH EVERY DAY   No facility-administered encounter medications on file as of 05/15/2021.    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   GERD (gastroesophageal reflux disease)    Hernia, incisional    after renal surgery   History of chicken pox    Hyperlipidemia    Renal cell carcinoma (HCC)    s/p right nephrectomy 1994   Sleep apnea    Past Surgical History:  Procedure Laterality Date   APPENDECTOMY  1994   BREAST BIOPSY Right 1991   Negative   CHOLECYSTECTOMY  1994   LAPAROSCOPIC GASTRIC SLEEVE RESECTION  02/02/2016   Dr Duke Salvia at [place]   Renal Cell Carcinoma   parotid gland removal  1990   also removed a tumor   TONSILLECTOMY     TOTAL HIP ARTHROPLASTY Right 08/08/2016   Procedure: TOTAL HIP ARTHROPLASTY;  Surgeon: Earnestine Leys, MD;  Location: [facility] ORS;  Service: Orthopedics;  Laterality: Right;   Family History  Problem Relation Age of Onset   Osteoporosis Mother    Dementia Mother    Heart disease Father    Heart attack Father    Breast cancer Maternal Aunt    Social History   Socioeconomic History   Marital status: Married    Spouse name: Not on file   Number of children: 1   Years of education: 12   Highest education level: High school graduate  Occupational History   Occupation: Retired    Comment: 2016  Tobacco Use   Smoking status: Never   Smokeless tobacco:  Never  Substance and Sexual Activity   Alcohol use: Not Currently    Alcohol/week: 0.0 standard drinks   Drug use: No   Sexual activity: Not on file  Other Topics Concern   Not on file  Social History Narrative   Not on file   Social Determinants of Health   Financial Resource Strain: Not on file  Food Insecurity: Not on file  Transportation Needs: Not on file  Physical Activity: Not on file  Stress: Not on file  Social Connections: Not on file    Tobacco Counseling Counseling given: Not Answered   Clinical Intake:  Pre-visit preparation completed: Yes  Pain : No/denies pain     Nutritional Risks: None Diabetes: No  How often do you need to have someone help you when you read instructions, pamphlets, or other written materials from your doctor or pharmacy?: 1 - Never  Diabetic?no  Interpreter Needed?: No  Information entered by :: Kirke Shaggy, LPN   Activities of Daily Living In your present state of health, do you have any difficulty performing the following activities: 06/01/2020  Hearing? N  Vision? N  Difficulty concentrating or making decisions? N  Walking or climbing stairs? N  Dressing or bathing? N  Doing errands, shopping? N  Some recent data might be hidden    Patient Care Team: Birdie Sons, MD as PCP - General (Family Medicine) Brittany Situ Kathlene November, MD as Consulting Physician (Cardiology) Earnestine Leys, MD (Specialist) [facility], [facility])  Indicate any recent Medical Services you may have received from other than [facility] providers in the past year (date may be approximate).     Assessment:   This is a routine wellness examination for Brittany Benton.  Hearing/Vision screen No results found.  Dietary issues and exercise activities discussed:     Goals Addressed   None    Depression Screen PHQ 2/9 Scores 08/15/2020 06/01/2020 05/09/2020 05/15/2019 08/02/2017 09/29/2014  PHQ - 2 Score 0 0 0 0 0 0  PHQ- 9 Score 1 0 - - 1 0     Fall Risk Fall Risk  06/01/2020 05/09/2020 05/15/2019  Falls in the past year? 0 1 0  Number falls in past yr: 0 1 -  Injury with Fall? 0 0 -  Follow up Falls evaluation completed Falls prevention discussed -    FALL RISK PREVENTION PERTAINING TO THE HOME:  Any stairs in or around the home? Yes  If so, are there any without handrails? No  Home free of loose throw rugs in walkways, pet beds, electrical cords, etc? Yes  Adequate lighting in your home to reduce risk of falls? Yes   ASSISTIVE DEVICES UTILIZED TO PREVENT FALLS:  Life alert? No  Use of a cane, walker or w/c? Yes  Grab bars in the bathroom? No  Shower chair or bench in shower? No  Elevated toilet seat or a handicapped toilet? Yes    Cognitive Function:Normal cognitive status assessed by direct observation by this Nurse Health Advisor. No abnormalities found.          Immunizations Immunization History  Administered Date(s) Administered   PFIZER(Purple Top)SARS-COV-2 Vaccination 06/12/2019, 07/08/2019, 04/22/2020   Tdap 09/29/2014   Zoster, Live 09/29/2014    TDAP status: Up to date  Flu Vaccine status: Declined, Education has been provided regarding the importance of this vaccine but patient still declined. Advised may receive this vaccine at local pharmacy or Health Dept. Aware to provide a copy of the vaccination record if obtained from local pharmacy or Health Dept. Verbalized acceptance and understanding.  Pneumococcal vaccine status: Declined,  Education has been provided regarding the importance of this vaccine but patient still declined. Advised may receive this vaccine at local pharmacy or Health Dept. Aware to provide a copy of the vaccination record if obtained from local pharmacy or Health Dept. Verbalized acceptance and understanding.   Covid-19 vaccine status: Completed vaccines  Qualifies for Shingles Vaccine? Yes   Zostavax completed Yes   Shingrix Completed?: No.    Education has been provided  regarding the importance of this vaccine. Patient has been advised to call insurance company to determine out of pocket expense if they have not yet received this vaccine. Advised may also receive vaccine at local pharmacy or Health Dept. Verbalized acceptance and understanding.  Screening Tests Health Maintenance  Topic Date Due   Zoster Vaccines- Shingrix (1 of 2) Never done   Pneumonia Vaccine 6+ Years old (1 - PCV) Never done   COVID-19 Vaccine (4 - Booster for Pfizer series) 06/17/2020   INFLUENZA VACCINE  Never done   COLONOSCOPY (Pts 45-55yrs Insurance coverage will need to be confirmed)  02/11/2021   MAMMOGRAM  06/13/2022   DEXA SCAN  06/14/2023   TETANUS/TDAP  09/28/2024   Hepatitis C Screening  Completed   HPV VACCINES  Aged Out    Health Maintenance  Health Maintenance Due  Topic Date Due   Zoster Vaccines- Shingrix (1 of 2) Never done   Pneumonia Vaccine 68+ Years old (1 - PCV) Never done   COVID-19 Vaccine (4 - Booster for [place] series) 06/17/2020   INFLUENZA VACCINE  Never done   COLONOSCOPY (Pts 45-45yrs Insurance coverage will need to be confirmed)  02/11/2021    Colorectal cancer screening: Type of screening: Colonoscopy. Completed 02/12/11. Repeat every 10 years  Mammogram status: Completed 06/13/20. Repeat every year  Bone Density status: Completed 06/13/20. Results reflect: Bone density results: NORMAL. Repeat every 5 years.  Lung Cancer Screening: (Low Dose CT Chest recommended if Age 4-80 years, 30 pack-year currently smoking OR have quit w/in 15years.) does not qualify.    Additional Screening:  Hepatitis C Screening: does qualify; Completed 05/15/19  Vision Screening: Recommended annual ophthalmology exams for early detection of glaucoma and other disorders of the eye. Is the patient up to date with their annual eye exam?  Yes  Who is the provider or what is the name of the office in which the patient attends annual eye exams? [facility]. If pt is  not established with a provider, would they like to be referred to a provider to establish care? No .   Dental Screening: Recommended annual dental exams for proper oral hygiene  Community Resource Referral / Chronic Care Management: CRR required this visit?  No   CCM required this visit?  No  Plan:     I have personally reviewed and noted the following in the patients chart:   Medical and social history Use of alcohol, tobacco or illicit drugs  Current medications and supplements including opioid prescriptions.  Functional ability and status Nutritional status Physical activity Advanced directives List of other physicians Hospitalizations, surgeries, and ER visits in previous 12 months Vitals Screenings to include cognitive, depression, and falls Referrals and appointments  In addition, I have reviewed and discussed with patient certain preventive protocols, quality metrics, and best practice recommendations. A written personalized care plan for preventive services as well as general preventive health recommendations were provided to patient.     Dionisio David, LPN   10/06/6331   Nurse Notes: diagnosed with a brain tumor in December 2022

## 2021-05-16 DIAGNOSIS — D329 Benign neoplasm of meninges, unspecified: Secondary | ICD-10-CM | POA: Diagnosis not present

## 2021-05-22 ENCOUNTER — Other Ambulatory Visit: Payer: Self-pay

## 2021-05-22 ENCOUNTER — Encounter: Payer: Self-pay | Admitting: Family Medicine

## 2021-05-22 ENCOUNTER — Ambulatory Visit (INDEPENDENT_AMBULATORY_CARE_PROVIDER_SITE_OTHER): Payer: Medicare Other | Admitting: Family Medicine

## 2021-05-22 VITALS — BP 129/77 | HR 98 | Temp 97.5°F | Resp 16

## 2021-05-22 DIAGNOSIS — D329 Benign neoplasm of meninges, unspecified: Secondary | ICD-10-CM

## 2021-05-22 DIAGNOSIS — R7303 Prediabetes: Secondary | ICD-10-CM

## 2021-05-22 DIAGNOSIS — R270 Ataxia, unspecified: Secondary | ICD-10-CM

## 2021-05-22 LAB — POCT GLYCOSYLATED HEMOGLOBIN (HGB A1C)
Est. average glucose Bld gHb Est-mCnc: <100
Hemoglobin A1C: 4.9 % (ref 4.0–5.6)

## 2021-05-22 NOTE — Patient Instructions (Signed)
.   Please review the attached list of medications and notify my office if there are any errors.   . Please bring all of your medications to every appointment so we can make sure that our medication list is the same as yours.   

## 2021-05-22 NOTE — Progress Notes (Signed)
I,Roshena L Chambers,acting as a scribe for Lelon Huh, MD.,have documented all relevant documentation on the behalf of Lelon Huh, MD,as directed by  Lelon Huh, MD while in the presence of Lelon Huh, MD.    Established patient visit   Patient: Brittany Benton   DOB: [birth year]   66 y.o. Female  MRN: [id number] Visit Date: 05/22/2021  Today's healthcare provider: Lelon Huh, MD   Chief Complaint  Patient presents with   Follow-up   Subjective    HPI  Follow up for brain tumor:  Patient was seen in the ER on 12/202022 for gait instability and recurrent falls. MRI of the brain was ordered and showed 1.1 cm meningioma overlying the left frontal convexity without associated mass effect or edema. She was referred to neurosurgery.  She had visit with Dr. Cari Caraway on 05/16/2021 who did not feel that excision would be necessary and apparently did not feel that her neurologic symptoms were related to the meningioma. She and advised to follow up with neurology.   She is also due to check on her blood sugar. Has been on Ozempic which she is very pleased with and tolerating the 1mg  dose with no adverse effects,   Medications: Outpatient Medications Prior to Visit  Medication Sig   atorvastatin (LIPITOR) 20 MG tablet TAKE 1 TABLET BY MOUTH EVERY EVENING   Cholecalciferol (VITAMIN D-3) 1000 units CAPS Take 1 capsule by mouth daily.   imipramine (TOFRANIL) 50 MG tablet TAKE ONE TABLET BY MOUTH TWICE DAILY   levothyroxine (SYNTHROID) 50 MCG tablet TAKE ONE TABLET BY MOUTH EVERY DAY   metoprolol succinate (TOPROL-XL) 50 MG 24 hr tablet TAKE ONE TABLET BY MOUTH EVERY DAY WITH OR IMMEDIATELY FOLLOWING A MEAL   Multiple Vitamins-Minerals (MULTIVITAMIN ADULT PO) Take 1 tablet by mouth daily.   omeprazole (PRILOSEC) 20 MG capsule TAKE 1 CAPSULE BY MOUTH ONCE DAILY   Semaglutide, 1 MG/DOSE, 4 MG/3ML SOPN Inject 1 mg as directed once a week.   sertraline (ZOLOFT) 50 MG tablet TAKE  ONE AND A HALF TABLETS BY MOUTH EVERY DAY   No facility-administered medications prior to visit.    Review of Systems  Constitutional:  Negative for appetite change, chills, fatigue and fever.  Respiratory:  Negative for chest tightness and shortness of breath.   Cardiovascular:  Negative for chest pain and palpitations.  Gastrointestinal:  Negative for abdominal pain, nausea and vomiting.  Neurological:  Negative for dizziness and weakness.      Objective    BP 129/77 (BP Location: Left Arm, Patient Position: Sitting, Cuff Size: Normal)    Pulse 98    Temp (!) 97.5 F (36.4 C) (Oral)    Resp 16    SpO2 100% Comment: room air {Show previous vital signs (optional):23777}  Physical Exam   General: Appearance:    Overweight female in no acute distress  Eyes:    PERRL, conjunctiva/corneas clear, EOM's intact       Lungs:     Clear to auscultation bilaterally, respirations unlabored  Heart:    Normal heart rate. Normal rhythm. No murmurs, rubs, or gallops.    MS:   All extremities are intact.    Neurologic:   Awake, alert, oriented x 3. No apparent focal neurological defect.        Results for orders placed or performed in visit on 05/22/21  POCT HgB A1C  Result Value Ref Range   Hemoglobin A1C 4.9 4.0 - 5.6 %   Est.  average glucose Bld gHb Est-mCnc <100      Assessment & Plan     1. Meningioma ([place]) Followed by Dr. Cari Caraway and likely an incidental finding.   2. Ataxia Likely unrelated to CT small meningioma on MRI. Is scheduled for evaluation with Dr. Manuella Ghazi.   3. Prediabetes Doing very well on Ozempic which she would like to continue.       The entirety of the information documented in the History of Present Illness, Review of Systems and Physical Exam were personally obtained by me. Portions of this information were initially documented by the CMA and reviewed by me for thoroughness and accuracy.     Lelon Huh, MD  [facility] [phone]  (phone) [phone] (fax)  [place]

## 2021-06-09 DIAGNOSIS — G20 Parkinson's disease: Secondary | ICD-10-CM | POA: Diagnosis not present

## 2021-06-09 DIAGNOSIS — D329 Benign neoplasm of meninges, unspecified: Secondary | ICD-10-CM | POA: Diagnosis not present

## 2021-06-09 DIAGNOSIS — F32A Depression, unspecified: Secondary | ICD-10-CM | POA: Diagnosis not present

## 2021-06-21 ENCOUNTER — Ambulatory Visit: Payer: Medicare Other | Attending: Neurology | Admitting: Physical Therapy

## 2021-06-21 ENCOUNTER — Encounter: Payer: Self-pay | Admitting: Physical Therapy

## 2021-06-21 ENCOUNTER — Other Ambulatory Visit: Payer: Self-pay

## 2021-06-21 VITALS — BP 98/70 | HR 77

## 2021-06-21 DIAGNOSIS — R262 Difficulty in walking, not elsewhere classified: Secondary | ICD-10-CM | POA: Diagnosis not present

## 2021-06-21 DIAGNOSIS — R296 Repeated falls: Secondary | ICD-10-CM | POA: Diagnosis not present

## 2021-06-21 NOTE — Therapy (Signed)
[facility] [address], [place], [address] Phone: [phone]   Fax:  [phone]  Physical Therapy Evaluation  Patient Details  Name: Brittany Benton MRN: [id number] Date of Birth: 11-18-1955 Referring Provider (PT): Dr. Manuella Ghazi   Encounter Date: 06/21/2021   PT End of Session - 06/21/21 0950     Visit Number 1    Number of Visits 16    Date for PT Re-Evaluation 08/16/21    Authorization Type UHC Medicare    Progress Note Due on Visit 10    PT Start Time 0930    PT Stop Time 1015    PT Time Calculation (min) 45 min    Equipment Utilized During Treatment Gait belt    Activity Tolerance Patient tolerated treatment well    Behavior During Therapy [facility] for tasks assessed/performed;Anxious             Past Medical History:  Diagnosis Date   Anxiety    Arthritis    osteoarthritis -right hip   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   GERD (gastroesophageal reflux disease)    Hernia, incisional    after renal surgery   History of chicken pox    Hyperlipidemia    Renal cell carcinoma (HCC)    s/p right nephrectomy 1994   Sleep apnea     Past Surgical History:  Procedure Laterality Date   APPENDECTOMY  1994   BREAST BIOPSY Right 1991   Negative   CHOLECYSTECTOMY  1994   LAPAROSCOPIC GASTRIC SLEEVE RESECTION  02/02/2016   Dr Duke Salvia at [place]   Renal Cell Carcinoma   parotid gland removal  1990   also removed a tumor   TONSILLECTOMY     TOTAL HIP ARTHROPLASTY Right 08/08/2016   Procedure: TOTAL HIP ARTHROPLASTY;  Surgeon: Earnestine Leys, MD;  Location: [facility] ORS;  Service: Orthopedics;  Laterality: Right;    Vitals:   06/21/21 0952  BP: 98/70  Pulse: 77  SpO2: 100%       Subjective Assessment - 06/21/21 0937     Subjective Patient accompanied by her husband for support. Patient reports that her legs keep giving out when she is walking or standing. This has  resulted in two falls which happened in the setting of standing. Symptoms started since the first of year and have worsened since then. She describes having resting tremors in her hands that makes it difficult to put on jewelry. She states it worsens with stress.    Patient is accompained by: Family member    Pertinent History Per Dr. Trena Platt note on 06/11/21 - We will put in an order for physical therapy for imbalance.  If the patient does not receive a call to schedule their initial physical therapy appointment, please use the information below to schedule an appointment directly.   [facility] Physical Therapy [phone])    - We briefly discussed etiology, pathogenesis and progression for parkinsonism. Explained neuropathological changes in nervous system, leading to clinical symptomatology. Patient should be aware of the motor features such as slowness (bradykinesia), asymmetric resting tremors, rigidity, postural instability. Less recognized features of parkinsonism are - decreased eye blinking, hypomimia, hypophonia, drooling, dysphagia, constipation, hyposmia, REM behavior disorder, autonomic instability and visual hallucinations etc. Patient can get more information from [email or web address] and [email or web address]   - Discussed having a movement-triggered camera in patient's room to monitor for dream enactment    Limitations Walking;Standing  How long can you sit comfortably? N/a    How long can you stand comfortably? Feels unsteady; unsure    How long can you walk comfortably? Feels unsteady; unsure    Patient Stated Goals She wants to walk with increased stability and to increase her strength. She wants to be able to use multiple floors in her house    Currently in Pain? No/denies                [facility] PT Assessment - 06/21/21 0001       Assessment   Medical Diagnosis imbalance    Referring Provider (PT) Dr. Manuella Ghazi    Onset Date/Surgical Date 06/09/21    Hand Dominance Right     Next MD Visit 09/14/21    Prior Therapy No      Precautions   Precautions Fall      Restrictions   Weight Bearing Restrictions No      Balance Screen   Has the patient fallen in the past 6 months Yes    How many times? 12    Has the patient had a decrease in activity level because of a fear of falling?  Yes    Is the patient reluctant to leave their home because of a fear of falling?  Yes      [place] Private residence    Living Arrangements Spouse/significant other    Available Help at Discharge Family    Type of [place] to enter    Entrance Stairs-Number of Steps 8    Entrance Stairs-Rails Right;Left    Home Layout Multi-level    Alternate Level Stairs-Number of Steps 13      Prior Function   Level of Independence Needs assistance with ADLs    Vocation Retired      Associate Professor   Overall Cognitive Status Within Functional Limits for tasks assessed            NEUROMUSCULAR PT EVAL    ====================================================== OBJECTIVE: Veteran ambulates into PT clinic with (x)AD, () no AD. AD=rollator  Veteran (x) well-groomed, appropriately dressed, pleasant, and cooperative.  No acute distress noted.      ROM: WFL LE and UE   STRENGTH: Anti-gravity movement in all limbs    COORDINATION: FNF: Able to touch target but with decreased speed   RAMs: '[]'$  WNL     '[x]'$  Rapid movements are slow, with moderately decreased amplitude, but regular. '[]'$  Dysdiadochokinesia    BALANCE  Response to External perturbations: Not assessed           Romberg Test: EO___ EC___   4 Stage Balance Test  1. stand with feet side by side: 10 sec           2. semi-tandem (instep to big toe): 10 sec/ 10 sec   3. tandem stance (heel to toe): Unable to perform           4. stand on one foot:   NT                  - an older adult who cannot hold tandem stance for at least 10s is at increased  risk for  falling      mCTSIB: Deferred due to time    Firm/Eyes Open:   Firm/Eyes Closed:   Foam/Eyes Open:   Foam/Eyes Closed:   Mini BESTest (measured at _/28pts on __) Deferred due to time  Anticipatory  Balance         1. Sit to stand:        /2         2. Rise to toes:        /2         3. Stand on 1 leg L/R:  /2 Reactive Postural Control - Compensatory Stepping Correction         4. Forward:             /2         5. Backward:            /2         6. Left/Right:          /2 Sensory Orientation         7. Romberg, EO, firm:   /2         8. Romberg, EC, foam:   /2         9. Incline, EC:         /2 Dynamic Gait         10. Change in gait speed:/2         11. Horiz. head turns:  /2         12. Pivot turns:        /2         13. Step over object:   /2         14. TUG / Dual task:    /2 Total: /28    FUNCTIONAL OUTCOME MEASURES:      30 SEC CHAIR STAND:  0  Average for Age = 84 - A below average score indicates an increased FALL RISK, LE weakness         - 25-68 year old female <14 reps, female <12 reps         - 45-68 year old female <12 reps, female <11 reps         - 74-68 year old female <12 reps, female <10 reps         - 18-68 year old female <11 reps, female <10 reps         - 81-68 year old female <10 reps, female <9 reps         - 41-68 year old female <8 reps, female <8 reps         - 21-68 year old female <7 reps, female <4 reps   5x SIT TO STAND:  0        , needs use of UE support  - cut-off score in predicting recurrent fallers: 15s (sensitivity 55%,   specificity 65%)   GAIT ASSESSMENT  Observation:   Gait Speed: __m/s, measured with (AD) Not assessed due to time   - <.957ms indicates severe impairment, household ambulator, dependence with ADLs,  and increased risk for hospitalization. - >.681m and <1.57m47mindicates limited ability to walk in the community, may be  at increased risk for falls - >1.57m/38mndicates community ambulator, decreased risk for  hospitalization,  increased IND with self-care, and less likely to have other adverse events  THEREX:  Sit to Stand with 1 UE support 2 x 5   NMR:  Romberg Hor Head Turns 1 x 10  Romberg Vert Head Turns 1 x 10   HEP includes:  STS 2 x 5 x 7 days per week  Romberg Hor Head Turns 1 x 10 x 7 days per week  Romberg Vert Head Turns 1 x 10 x 7 days per week                     PT Short Term Goals - 06/21/21 1046       PT SHORT TERM GOAL #1   Title Patient demonstrate understanding of HEP for improved outcomes with self-treatment of underlying condition.    Baseline 06/21/21: NT    Time 2    Period Weeks    Status New    Target Date 07/05/21      PT SHORT TERM GOAL #2   Title Patient will demonstrate improved LE strength with ability to stand without use of UE to decrease risk of falling.    Baseline 06/21/21: Needs to use UE support    Time 2    Period Weeks    Status New    Target Date 07/05/21               PT Long Term Goals - 06/21/21 1048       PT LONG TERM GOAL #1   Title Patient will have improved function and activity level as evidenced by an increase in FOTO score by 10 points or more.    Baseline 06/21/21: 43/53    Time 8    Period Weeks    Status New    Target Date 08/16/21      PT LONG TERM GOAL #2   Title Patient will improve 48mT by 269 ft to demonstrate improvement in aerobic endurance to return to walking longer distances for improved independence and to decrease caregiver burden.    Baseline 06/21/21: NT    Time 8    Period Weeks    Status New    Target Date 08/16/21      PT LONG TERM GOAL #3   Title Patient will demonstrate a Mini-Best Test score of >20/32 to decrease risk of falling to remain independent and to decrease caregiver burden.    Baseline 06/21/21: NT    Time 8    Period Weeks    Status New    Target Date 08/16/21      PT LONG TERM GOAL #4   Title Patient will perform 5 x STS in <15 sec to decrease risk of falling  to remain independent and to decrease caregiver burden.    Baseline 06/21/21: 0    Time 8    Period Weeks    Status New    Target Date 08/16/21                    Plan - 06/21/21 1707     Clinical Impression Statement Pt is a 66yo female that presents for initial eval for frequent falls in the setting of Parkinson's disease. She exhibits decreased LE strength and endurance and decreased static balance that places her at an increased risk for falls. She will benefit from skilled PT to increase her LE strength and endurance and to improve her dynamic and static balance to increase her mobility and to decrease her risk of falling to remain independent and to decrease her caregiver burden.    Personal Factors and Comorbidities Age;Comorbidity 3+    Comorbidities HLD, Obesity, Meningioma, and Parkinsonism    Examination-Activity Limitations Bathing;Bend;Sit;Stand;Stairs;Locomotion Level;Transfers;Toileting    Examination-Participation Restrictions Meal Prep;Cleaning;Interpersonal Relationship    Stability/Clinical Decision Making Evolving/Moderate complexity    Clinical Decision Making Moderate    Rehab Potential Fair    PT  Frequency 2x / week    PT Duration 8 weeks    PT Next Visit Plan Mini-Bestest and progress strengthening and static and dynamic balance exercises    PT Home Exercise Plan STS 2 x 5 x 7 days per week   Romberg Hor Head Turns 1 x 10 x 7 days per week   Romberg Vert Head Turns 1 x 10 x 7 days per week    Consulted and Agree with Plan of Care Patient;Family member/caregiver             Patient will benefit from skilled therapeutic intervention in order to improve the following deficits and impairments:  Abnormal gait, Decreased balance, Decreased endurance, Decreased strength, Postural dysfunction, Difficulty walking, Decreased mobility, Decreased coordination  Visit Diagnosis: Repeated falls  Difficulty in walking, not elsewhere classified     Problem  List Patient Active Problem List   Diagnosis Date Noted   Meningioma ([place]) 04/05/2021   Hypothyroidism 08/15/2020   Osteopenia 06/14/2020   H/O gastric bypass 08/28/2016   Status post total hip replacement, 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   Osteoarthritis of right hip 03/26/2016   Status post bariatric surgery 03/26/2016   Sinus tachycardia 08/01/2015   Abnormal EKG 08/01/2015   Gastroesophageal reflux disease 06/08/2015   Malaise 06/08/2015   Morbid obesity ([place]) 06/08/2015   OSA (obstructive sleep apnea) 06/08/2015   Arthritis of hip 03/23/2015   Left knee pain 02/15/2015   Vitamin D deficiency 09/29/2014   Lump of right breast 09/22/2014   Prediabetes 09/22/2014   Pruritic dermatitis 09/22/2014   Obstructive sleep apnea 09/22/2014   Allergic rhinitis 09/15/2009   Hypersomnia 09/14/2009   Panic disorder 08/06/2008   Essential (primary) hypertension 07/16/2007   Hyperlipidemia 07/16/2007   Bradly Chris PT, DPT  06/21/2021, 5:33 PM  [facility] [address] [place], [address] Phone: [phone]   Fax:  [phone]  Name: ANYELIN MOGLE MRN: [id number] Date of Birth: [birth year]

## 2021-06-26 ENCOUNTER — Ambulatory Visit: Payer: Medicare Other | Admitting: Physical Therapy

## 2021-06-26 ENCOUNTER — Other Ambulatory Visit: Payer: Self-pay

## 2021-06-26 DIAGNOSIS — R262 Difficulty in walking, not elsewhere classified: Secondary | ICD-10-CM | POA: Diagnosis not present

## 2021-06-26 DIAGNOSIS — R296 Repeated falls: Secondary | ICD-10-CM

## 2021-06-26 NOTE — Therapy (Signed)
[facility] [address], [place], [address] Phone: [phone]   Fax:  [phone]  Physical Therapy Treatment  Patient Details  Name: Brittany Benton MRN: [id number] Date of Birth: June 12, 1955 Referring Provider (PT): Dr. Manuella Ghazi   Encounter Date: 06/26/2021   PT End of Session - 06/26/21 1616     Visit Number 2    Number of Visits 16    Date for PT Re-Evaluation 08/16/21    Authorization Type UHC Medicare    Progress Note Due on Visit 10    PT Start Time 1500    PT Stop Time 1545    PT Time Calculation (min) 45 min    Equipment Utilized During Treatment Gait belt    Activity Tolerance Patient tolerated treatment well    Behavior During Therapy [facility] for tasks assessed/performed;Anxious             Past Medical History:  Diagnosis Date   Anxiety    Arthritis    osteoarthritis -right hip   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   GERD (gastroesophageal reflux disease)    Hernia, incisional    after renal surgery   History of chicken pox    Hyperlipidemia    Renal cell carcinoma (HCC)    s/p right nephrectomy 1994   Sleep apnea     Past Surgical History:  Procedure Laterality Date   APPENDECTOMY  1994   BREAST BIOPSY Right 1991   Negative   CHOLECYSTECTOMY  1994   LAPAROSCOPIC GASTRIC SLEEVE RESECTION  02/02/2016   Dr Duke Salvia at [place]   Renal Cell Carcinoma   parotid gland removal  1990   also removed a tumor   TONSILLECTOMY     TOTAL HIP ARTHROPLASTY Right 08/08/2016   Procedure: TOTAL HIP ARTHROPLASTY;  Surgeon: Earnestine Leys, MD;  Location: [facility] ORS;  Service: Orthopedics;  Laterality: Right;    There were no vitals filed for this visit.   Subjective Assessment - 06/26/21 1508     Subjective Pt reports that she continues to feel weakness especially in LE. She has been able to do exercises but not consistently because of fatigue.    Patient is  accompained by: Family member    Pertinent History Per Dr. Trena Platt note on 06/11/21 - We will put in an order for physical therapy for imbalance.  If the patient does not receive a call to schedule their initial physical therapy appointment, please use the information below to schedule an appointment directly.   [facility] Physical Therapy [phone])    - We briefly discussed etiology, pathogenesis and progression for parkinsonism. Explained neuropathological changes in nervous system, leading to clinical symptomatology. Patient should be aware of the motor features such as slowness (bradykinesia), asymmetric resting tremors, rigidity, postural instability. Less recognized features of parkinsonism are - decreased eye blinking, hypomimia, hypophonia, drooling, dysphagia, constipation, hyposmia, REM behavior disorder, autonomic instability and visual hallucinations etc. Patient can get more information from [email or web address] and [email or web address]   - Discussed having a movement-triggered camera in patient's room to monitor for dream enactment    Limitations Walking;Standing    How long can you sit comfortably? N/a    How long can you stand comfortably? Feels unsteady; unsure    How long can you walk comfortably? Feels unsteady; unsure    Patient Stated Goals She wants to walk with increased stability and to increase her strength. She wants to be  able to use multiple floors in her house    Currently in Pain? No/denies            THEREX:  Mini-BESTest  -Anticipatory: 2 -Reactive Postural: 0 -Sensory Orientation: 2   Did not complete rest of test due to patient fatigue   Review of how to get up from fall: [email or web address]   Supine Bridges 3 x 5   Abdominal Crunches 2 x 10   Sequenced how to stand up and turn around from rollator.     Access Code: [email or web address] URL: [email or web address] Date: 06/26/2021 Prepared by: Bradly Chris  Exercises Supine Bridge - 1 x daily - 3 x weekly - 3 sets - 5 reps Half Tandem Stance Balance with Head Rotation - 1 x daily - 7 x weekly - 1 sets - 10 reps Ab Prep - 1 x daily - 3 x weekly - 2 sets - 10 reps                      PT Education - 06/26/21 1509     Education Details form and technique for appropriate exercise    Person(s) Educated Patient    Methods Explanation    Comprehension Verbalized understanding;Returned demonstration;Verbal cues required              PT Short Term Goals - 06/26/21 1623       PT SHORT TERM GOAL #1   Title Patient demonstrate understanding of HEP for improved outcomes with self-treatment of underlying condition.    Baseline 06/21/21: NT    Time 2    Period Weeks    Status On-going    Target Date 07/05/21      PT SHORT TERM GOAL #2   Title Patient will demonstrate improved LE strength with ability to stand without use of UE to decrease risk of falling.    Baseline 06/21/21: Needs to use UE support    Time 2    Period Weeks    Status On-going    Target Date 07/05/21               PT Long Term Goals - 06/26/21 1623       PT LONG TERM GOAL #1   Title Patient will have improved function and activity level as evidenced by an increase in FOTO score by 10 points or more.    Baseline 06/21/21: 43/53    Time 8    Period Weeks    Status On-going    Target Date 08/16/21      PT LONG TERM GOAL #2   Title Patient will improve 75mT by 269 ft to demonstrate improvement in aerobic endurance to return to walking longer distances for improved independence and to decrease caregiver burden.    Baseline 06/21/21: NT    Time 8    Period Weeks    Status On-going    Target Date 08/16/21      PT LONG TERM GOAL #3   Title Patient will demonstrate a Mini-Best Test score of >20/32 to decrease risk of falling to remain independent and to decrease caregiver burden.    Baseline 06/21/21: NT 3/13: 4/18 on Mini-BESTest    Time 8     Period Weeks    Status On-going    Target Date 08/16/21      PT LONG TERM GOAL #4   Title Patient will perform 5 x STS in <15 sec to decrease risk of falling to remain independent  and to decrease caregiver burden.    Baseline 06/21/21: 0    Time 8    Period Weeks    Status On-going    Target Date 08/16/21                   Plan - 06/26/21 1616     Clinical Impression Statement Pt exhibits decreased balance as indicated by moderate to severe deficits noted on the anticipatory, reactive postural control, and sensory orientation portions of the Mini-BesTest. Test not completed due to increased pt fatigue. Pt needs increased encouragement and reassurance to perform exercises especially balance exercises. Focus of session on standing from fall, with sequencing of movements and focus on floor mobility with abdominal strengthening and bridges. Pt able to perform all exercises.    Personal Factors and Comorbidities Age;Comorbidity 3+    Comorbidities HLD, Obesity, Meningioma, and Parkinsonism    Examination-Activity Limitations Bathing;Bend;Sit;Stand;Stairs;Locomotion Level;Transfers;Toileting    Examination-Participation Restrictions Meal Prep;Cleaning;Interpersonal Relationship    Stability/Clinical Decision Making Evolving/Moderate complexity    Clinical Decision Making Low    Rehab Potential Fair    PT Frequency 2x / week    PT Duration 8 weeks    PT Treatment/Interventions ADLs/Self Care Home Management;Aquatic Therapy;Manual techniques;Neuromuscular re-education;Balance training;Therapeutic exercise;Therapeutic activities;Gait training;Dry needling;Patient/family education;DME Instruction;Stair training    PT Next Visit Plan Mini-Bestest and progress strengthening and static and dynamic balance exercises    Consulted and Agree with Plan of Care Patient;Family member/caregiver             Patient will benefit from skilled therapeutic intervention in order to improve the  following deficits and impairments:  Abnormal gait, Decreased balance, Decreased endurance, Decreased strength, Postural dysfunction, Difficulty walking, Decreased mobility, Decreased coordination  Visit Diagnosis: Repeated falls  Difficulty in walking, not elsewhere classified     Problem List Patient Active Problem List   Diagnosis Date Noted   Meningioma ([place]) 04/05/2021   Hypothyroidism 08/15/2020   Osteopenia 06/14/2020   H/O gastric bypass 08/28/2016   Status post total hip replacement, 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   Osteoarthritis of right hip 03/26/2016   Status post bariatric surgery 03/26/2016   Sinus tachycardia 08/01/2015   Abnormal EKG 08/01/2015   Gastroesophageal reflux disease 06/08/2015   Malaise 06/08/2015   Morbid obesity ([place]) 06/08/2015   OSA (obstructive sleep apnea) 06/08/2015   Arthritis of hip 03/23/2015   Left knee pain 02/15/2015   Vitamin D deficiency 09/29/2014   Lump of right breast 09/22/2014   Prediabetes 09/22/2014   Pruritic dermatitis 09/22/2014   Obstructive sleep apnea 09/22/2014   Allergic rhinitis 09/15/2009   Hypersomnia 09/14/2009   Panic disorder 08/06/2008   Essential (primary) hypertension 07/16/2007   Hyperlipidemia 07/16/2007    Daneil Dan, PT 06/26/2021, 4:29 PM  [facility] [address], [place], [address] Phone: [phone]   Fax:  [phone]  Name: Brittany Benton MRN: [id number] Date of Birth: 1956/03/31

## 2021-06-28 ENCOUNTER — Ambulatory Visit: Payer: Medicare Other | Admitting: Physical Therapy

## 2021-07-05 ENCOUNTER — Ambulatory Visit: Payer: Medicare Other | Admitting: Physical Therapy

## 2021-07-05 DIAGNOSIS — K219 Gastro-esophageal reflux disease without esophagitis: Secondary | ICD-10-CM | POA: Diagnosis not present

## 2021-07-05 DIAGNOSIS — I10 Essential (primary) hypertension: Secondary | ICD-10-CM | POA: Diagnosis not present

## 2021-07-05 DIAGNOSIS — G4733 Obstructive sleep apnea (adult) (pediatric): Secondary | ICD-10-CM | POA: Diagnosis not present

## 2021-07-05 DIAGNOSIS — F32A Depression, unspecified: Secondary | ICD-10-CM | POA: Diagnosis not present

## 2021-07-05 DIAGNOSIS — M1990 Unspecified osteoarthritis, unspecified site: Secondary | ICD-10-CM | POA: Diagnosis not present

## 2021-07-05 DIAGNOSIS — E559 Vitamin D deficiency, unspecified: Secondary | ICD-10-CM | POA: Diagnosis not present

## 2021-07-05 DIAGNOSIS — Z9181 History of falling: Secondary | ICD-10-CM | POA: Diagnosis not present

## 2021-07-05 DIAGNOSIS — M47816 Spondylosis without myelopathy or radiculopathy, lumbar region: Secondary | ICD-10-CM | POA: Diagnosis not present

## 2021-07-05 DIAGNOSIS — G20 Parkinson's disease: Secondary | ICD-10-CM | POA: Diagnosis not present

## 2021-07-05 DIAGNOSIS — Z9089 Acquired absence of other organs: Secondary | ICD-10-CM | POA: Diagnosis not present

## 2021-07-05 DIAGNOSIS — E039 Hypothyroidism, unspecified: Secondary | ICD-10-CM | POA: Diagnosis not present

## 2021-07-05 DIAGNOSIS — Z7985 Long-term (current) use of injectable non-insulin antidiabetic drugs: Secondary | ICD-10-CM | POA: Diagnosis not present

## 2021-07-05 DIAGNOSIS — Z905 Acquired absence of kidney: Secondary | ICD-10-CM | POA: Diagnosis not present

## 2021-07-05 DIAGNOSIS — E119 Type 2 diabetes mellitus without complications: Secondary | ICD-10-CM | POA: Diagnosis not present

## 2021-07-05 DIAGNOSIS — Z85828 Personal history of other malignant neoplasm of skin: Secondary | ICD-10-CM | POA: Diagnosis not present

## 2021-07-05 DIAGNOSIS — E785 Hyperlipidemia, unspecified: Secondary | ICD-10-CM | POA: Diagnosis not present

## 2021-07-05 DIAGNOSIS — Z9049 Acquired absence of other specified parts of digestive tract: Secondary | ICD-10-CM | POA: Diagnosis not present

## 2021-07-06 DIAGNOSIS — M47816 Spondylosis without myelopathy or radiculopathy, lumbar region: Secondary | ICD-10-CM | POA: Diagnosis not present

## 2021-07-06 DIAGNOSIS — G20 Parkinson's disease: Secondary | ICD-10-CM | POA: Diagnosis not present

## 2021-07-06 DIAGNOSIS — M1990 Unspecified osteoarthritis, unspecified site: Secondary | ICD-10-CM | POA: Diagnosis not present

## 2021-07-06 DIAGNOSIS — K219 Gastro-esophageal reflux disease without esophagitis: Secondary | ICD-10-CM | POA: Diagnosis not present

## 2021-07-06 DIAGNOSIS — Z9049 Acquired absence of other specified parts of digestive tract: Secondary | ICD-10-CM | POA: Diagnosis not present

## 2021-07-06 DIAGNOSIS — E559 Vitamin D deficiency, unspecified: Secondary | ICD-10-CM | POA: Diagnosis not present

## 2021-07-06 DIAGNOSIS — Z7985 Long-term (current) use of injectable non-insulin antidiabetic drugs: Secondary | ICD-10-CM | POA: Diagnosis not present

## 2021-07-06 DIAGNOSIS — E785 Hyperlipidemia, unspecified: Secondary | ICD-10-CM | POA: Diagnosis not present

## 2021-07-06 DIAGNOSIS — G4733 Obstructive sleep apnea (adult) (pediatric): Secondary | ICD-10-CM | POA: Diagnosis not present

## 2021-07-06 DIAGNOSIS — Z9089 Acquired absence of other organs: Secondary | ICD-10-CM | POA: Diagnosis not present

## 2021-07-06 DIAGNOSIS — I10 Essential (primary) hypertension: Secondary | ICD-10-CM | POA: Diagnosis not present

## 2021-07-06 DIAGNOSIS — E039 Hypothyroidism, unspecified: Secondary | ICD-10-CM | POA: Diagnosis not present

## 2021-07-06 DIAGNOSIS — Z85828 Personal history of other malignant neoplasm of skin: Secondary | ICD-10-CM | POA: Diagnosis not present

## 2021-07-06 DIAGNOSIS — E119 Type 2 diabetes mellitus without complications: Secondary | ICD-10-CM | POA: Diagnosis not present

## 2021-07-06 DIAGNOSIS — F32A Depression, unspecified: Secondary | ICD-10-CM | POA: Diagnosis not present

## 2021-07-06 DIAGNOSIS — Z905 Acquired absence of kidney: Secondary | ICD-10-CM | POA: Diagnosis not present

## 2021-07-06 DIAGNOSIS — Z9181 History of falling: Secondary | ICD-10-CM | POA: Diagnosis not present

## 2021-07-10 ENCOUNTER — Encounter: Payer: Medicare Other | Admitting: Physical Therapy

## 2021-07-10 DIAGNOSIS — Z9089 Acquired absence of other organs: Secondary | ICD-10-CM | POA: Diagnosis not present

## 2021-07-10 DIAGNOSIS — G20 Parkinson's disease: Secondary | ICD-10-CM | POA: Diagnosis not present

## 2021-07-10 DIAGNOSIS — Z85828 Personal history of other malignant neoplasm of skin: Secondary | ICD-10-CM | POA: Diagnosis not present

## 2021-07-10 DIAGNOSIS — M47816 Spondylosis without myelopathy or radiculopathy, lumbar region: Secondary | ICD-10-CM | POA: Diagnosis not present

## 2021-07-10 DIAGNOSIS — G4733 Obstructive sleep apnea (adult) (pediatric): Secondary | ICD-10-CM | POA: Diagnosis not present

## 2021-07-10 DIAGNOSIS — Z7985 Long-term (current) use of injectable non-insulin antidiabetic drugs: Secondary | ICD-10-CM | POA: Diagnosis not present

## 2021-07-10 DIAGNOSIS — E039 Hypothyroidism, unspecified: Secondary | ICD-10-CM | POA: Diagnosis not present

## 2021-07-10 DIAGNOSIS — K219 Gastro-esophageal reflux disease without esophagitis: Secondary | ICD-10-CM | POA: Diagnosis not present

## 2021-07-10 DIAGNOSIS — M1990 Unspecified osteoarthritis, unspecified site: Secondary | ICD-10-CM | POA: Diagnosis not present

## 2021-07-10 DIAGNOSIS — Z9181 History of falling: Secondary | ICD-10-CM | POA: Diagnosis not present

## 2021-07-10 DIAGNOSIS — E119 Type 2 diabetes mellitus without complications: Secondary | ICD-10-CM | POA: Diagnosis not present

## 2021-07-10 DIAGNOSIS — I10 Essential (primary) hypertension: Secondary | ICD-10-CM | POA: Diagnosis not present

## 2021-07-10 DIAGNOSIS — F32A Depression, unspecified: Secondary | ICD-10-CM | POA: Diagnosis not present

## 2021-07-10 DIAGNOSIS — E559 Vitamin D deficiency, unspecified: Secondary | ICD-10-CM | POA: Diagnosis not present

## 2021-07-10 DIAGNOSIS — E785 Hyperlipidemia, unspecified: Secondary | ICD-10-CM | POA: Diagnosis not present

## 2021-07-10 DIAGNOSIS — Z9049 Acquired absence of other specified parts of digestive tract: Secondary | ICD-10-CM | POA: Diagnosis not present

## 2021-07-10 DIAGNOSIS — Z905 Acquired absence of kidney: Secondary | ICD-10-CM | POA: Diagnosis not present

## 2021-07-11 DIAGNOSIS — Z9049 Acquired absence of other specified parts of digestive tract: Secondary | ICD-10-CM | POA: Diagnosis not present

## 2021-07-11 DIAGNOSIS — Z85828 Personal history of other malignant neoplasm of skin: Secondary | ICD-10-CM | POA: Diagnosis not present

## 2021-07-11 DIAGNOSIS — Z905 Acquired absence of kidney: Secondary | ICD-10-CM | POA: Diagnosis not present

## 2021-07-11 DIAGNOSIS — Z9181 History of falling: Secondary | ICD-10-CM | POA: Diagnosis not present

## 2021-07-11 DIAGNOSIS — G4733 Obstructive sleep apnea (adult) (pediatric): Secondary | ICD-10-CM | POA: Diagnosis not present

## 2021-07-11 DIAGNOSIS — K219 Gastro-esophageal reflux disease without esophagitis: Secondary | ICD-10-CM | POA: Diagnosis not present

## 2021-07-11 DIAGNOSIS — G20 Parkinson's disease: Secondary | ICD-10-CM | POA: Diagnosis not present

## 2021-07-11 DIAGNOSIS — Z9089 Acquired absence of other organs: Secondary | ICD-10-CM | POA: Diagnosis not present

## 2021-07-11 DIAGNOSIS — F32A Depression, unspecified: Secondary | ICD-10-CM | POA: Diagnosis not present

## 2021-07-11 DIAGNOSIS — Z7985 Long-term (current) use of injectable non-insulin antidiabetic drugs: Secondary | ICD-10-CM | POA: Diagnosis not present

## 2021-07-11 DIAGNOSIS — I10 Essential (primary) hypertension: Secondary | ICD-10-CM | POA: Diagnosis not present

## 2021-07-11 DIAGNOSIS — E039 Hypothyroidism, unspecified: Secondary | ICD-10-CM | POA: Diagnosis not present

## 2021-07-11 DIAGNOSIS — E559 Vitamin D deficiency, unspecified: Secondary | ICD-10-CM | POA: Diagnosis not present

## 2021-07-11 DIAGNOSIS — M47816 Spondylosis without myelopathy or radiculopathy, lumbar region: Secondary | ICD-10-CM | POA: Diagnosis not present

## 2021-07-11 DIAGNOSIS — E119 Type 2 diabetes mellitus without complications: Secondary | ICD-10-CM | POA: Diagnosis not present

## 2021-07-11 DIAGNOSIS — M1990 Unspecified osteoarthritis, unspecified site: Secondary | ICD-10-CM | POA: Diagnosis not present

## 2021-07-11 DIAGNOSIS — E785 Hyperlipidemia, unspecified: Secondary | ICD-10-CM | POA: Diagnosis not present

## 2021-07-12 ENCOUNTER — Encounter: Payer: Medicare Other | Admitting: Physical Therapy

## 2021-07-12 DIAGNOSIS — Z9089 Acquired absence of other organs: Secondary | ICD-10-CM | POA: Diagnosis not present

## 2021-07-12 DIAGNOSIS — K219 Gastro-esophageal reflux disease without esophagitis: Secondary | ICD-10-CM | POA: Diagnosis not present

## 2021-07-12 DIAGNOSIS — G20 Parkinson's disease: Secondary | ICD-10-CM | POA: Diagnosis not present

## 2021-07-12 DIAGNOSIS — Z9049 Acquired absence of other specified parts of digestive tract: Secondary | ICD-10-CM | POA: Diagnosis not present

## 2021-07-12 DIAGNOSIS — M47816 Spondylosis without myelopathy or radiculopathy, lumbar region: Secondary | ICD-10-CM | POA: Diagnosis not present

## 2021-07-12 DIAGNOSIS — M1990 Unspecified osteoarthritis, unspecified site: Secondary | ICD-10-CM | POA: Diagnosis not present

## 2021-07-12 DIAGNOSIS — F32A Depression, unspecified: Secondary | ICD-10-CM | POA: Diagnosis not present

## 2021-07-12 DIAGNOSIS — I10 Essential (primary) hypertension: Secondary | ICD-10-CM | POA: Diagnosis not present

## 2021-07-12 DIAGNOSIS — Z7985 Long-term (current) use of injectable non-insulin antidiabetic drugs: Secondary | ICD-10-CM | POA: Diagnosis not present

## 2021-07-12 DIAGNOSIS — Z9181 History of falling: Secondary | ICD-10-CM | POA: Diagnosis not present

## 2021-07-12 DIAGNOSIS — Z905 Acquired absence of kidney: Secondary | ICD-10-CM | POA: Diagnosis not present

## 2021-07-12 DIAGNOSIS — E039 Hypothyroidism, unspecified: Secondary | ICD-10-CM | POA: Diagnosis not present

## 2021-07-12 DIAGNOSIS — E119 Type 2 diabetes mellitus without complications: Secondary | ICD-10-CM | POA: Diagnosis not present

## 2021-07-12 DIAGNOSIS — E785 Hyperlipidemia, unspecified: Secondary | ICD-10-CM | POA: Diagnosis not present

## 2021-07-12 DIAGNOSIS — Z85828 Personal history of other malignant neoplasm of skin: Secondary | ICD-10-CM | POA: Diagnosis not present

## 2021-07-12 DIAGNOSIS — G4733 Obstructive sleep apnea (adult) (pediatric): Secondary | ICD-10-CM | POA: Diagnosis not present

## 2021-07-12 DIAGNOSIS — E559 Vitamin D deficiency, unspecified: Secondary | ICD-10-CM | POA: Diagnosis not present

## 2021-07-14 ENCOUNTER — Other Ambulatory Visit: Payer: Self-pay | Admitting: Neurosurgery

## 2021-07-14 DIAGNOSIS — D329 Benign neoplasm of meninges, unspecified: Secondary | ICD-10-CM

## 2021-07-17 ENCOUNTER — Encounter: Payer: Medicare Other | Admitting: Physical Therapy

## 2021-07-17 ENCOUNTER — Ambulatory Visit: Payer: Self-pay

## 2021-07-17 DIAGNOSIS — Z9049 Acquired absence of other specified parts of digestive tract: Secondary | ICD-10-CM | POA: Diagnosis not present

## 2021-07-17 DIAGNOSIS — E039 Hypothyroidism, unspecified: Secondary | ICD-10-CM | POA: Diagnosis not present

## 2021-07-17 DIAGNOSIS — E785 Hyperlipidemia, unspecified: Secondary | ICD-10-CM | POA: Diagnosis not present

## 2021-07-17 DIAGNOSIS — Z9089 Acquired absence of other organs: Secondary | ICD-10-CM | POA: Diagnosis not present

## 2021-07-17 DIAGNOSIS — I10 Essential (primary) hypertension: Secondary | ICD-10-CM | POA: Diagnosis not present

## 2021-07-17 DIAGNOSIS — G20 Parkinson's disease: Secondary | ICD-10-CM | POA: Diagnosis not present

## 2021-07-17 DIAGNOSIS — G4733 Obstructive sleep apnea (adult) (pediatric): Secondary | ICD-10-CM | POA: Diagnosis not present

## 2021-07-17 DIAGNOSIS — F32A Depression, unspecified: Secondary | ICD-10-CM | POA: Diagnosis not present

## 2021-07-17 DIAGNOSIS — K219 Gastro-esophageal reflux disease without esophagitis: Secondary | ICD-10-CM | POA: Diagnosis not present

## 2021-07-17 DIAGNOSIS — Z905 Acquired absence of kidney: Secondary | ICD-10-CM | POA: Diagnosis not present

## 2021-07-17 DIAGNOSIS — Z9181 History of falling: Secondary | ICD-10-CM | POA: Diagnosis not present

## 2021-07-17 DIAGNOSIS — Z85828 Personal history of other malignant neoplasm of skin: Secondary | ICD-10-CM | POA: Diagnosis not present

## 2021-07-17 DIAGNOSIS — E559 Vitamin D deficiency, unspecified: Secondary | ICD-10-CM | POA: Diagnosis not present

## 2021-07-17 DIAGNOSIS — M47816 Spondylosis without myelopathy or radiculopathy, lumbar region: Secondary | ICD-10-CM | POA: Diagnosis not present

## 2021-07-17 DIAGNOSIS — E119 Type 2 diabetes mellitus without complications: Secondary | ICD-10-CM | POA: Diagnosis not present

## 2021-07-17 DIAGNOSIS — M1990 Unspecified osteoarthritis, unspecified site: Secondary | ICD-10-CM | POA: Diagnosis not present

## 2021-07-17 DIAGNOSIS — Z7985 Long-term (current) use of injectable non-insulin antidiabetic drugs: Secondary | ICD-10-CM | POA: Diagnosis not present

## 2021-07-17 NOTE — Telephone Encounter (Signed)
?  Chief Complaint: medication reactions ?Symptoms: pt has periods of extreme anxiousness and weakness almost immobility ?Frequency: 5-6 weeks  ?Pertinent Negatives: NA ?Disposition: '[]'$ ED /'[]'$ Urgent Care (no appt availability in office) / '[x]'$ Appointment(In office/virtual)/ '[]'$  [facility]/ '[]'$ Home Care/ '[]'$ Refused Recommended Disposition /'[]'$ [facility]/ '[]'$  Follow-up with PCP ?Additional Notes: spoke with pt's husband regarding medications. Pt is taking Sinemet BID and unsure if its reacting with the Zoloft or not. Scheduled VV with Dr. Caryn Section on 07/19/21 at 1120.  ? ? ?Summary: possible drug interaction  ? Pt's husband is calling regarding a possible drug interaction between her parkinson drug and Zoloft.  He said he has questioned this before but he still feels there could be an interaction.  ? ?CB#  [phone] or [phone]   ?  ? ? ?Reason for Disposition ? [1] Caller has NON-URGENT medicine question about med that PCP prescribed AND [2] triager unable to answer question ? ?Answer Assessment - Initial Assessment Questions ?1. NAME of MEDICATION: "What medicine are you calling about?" ?    Sinemet and zoloft  ?2. QUESTION: "What is your question?" (e.g., double dose of medicine, side effect) ?    Having SE from both medications ?3. PRESCRIBING HCP: "Who prescribed it?" Reason: if prescribed by specialist, call should be referred to that group. ?    [facility] for zoloft ?4. SYMPTOMS: "Do you have any symptoms?" ?    Agitated and extreme weakness ?5. SEVERITY: If symptoms are present, ask "Are they mild, moderate or severe?" ?    Mild to moderate ? ?Protocols used: Medication Question Call-A-AH ? ?

## 2021-07-18 DIAGNOSIS — E119 Type 2 diabetes mellitus without complications: Secondary | ICD-10-CM | POA: Diagnosis not present

## 2021-07-18 DIAGNOSIS — I10 Essential (primary) hypertension: Secondary | ICD-10-CM | POA: Diagnosis not present

## 2021-07-18 DIAGNOSIS — Z905 Acquired absence of kidney: Secondary | ICD-10-CM | POA: Diagnosis not present

## 2021-07-18 DIAGNOSIS — F32A Depression, unspecified: Secondary | ICD-10-CM | POA: Diagnosis not present

## 2021-07-18 DIAGNOSIS — Z9089 Acquired absence of other organs: Secondary | ICD-10-CM | POA: Diagnosis not present

## 2021-07-18 DIAGNOSIS — Z9181 History of falling: Secondary | ICD-10-CM | POA: Diagnosis not present

## 2021-07-18 DIAGNOSIS — Z85828 Personal history of other malignant neoplasm of skin: Secondary | ICD-10-CM | POA: Diagnosis not present

## 2021-07-18 DIAGNOSIS — E039 Hypothyroidism, unspecified: Secondary | ICD-10-CM | POA: Diagnosis not present

## 2021-07-18 DIAGNOSIS — Z9049 Acquired absence of other specified parts of digestive tract: Secondary | ICD-10-CM | POA: Diagnosis not present

## 2021-07-18 DIAGNOSIS — K219 Gastro-esophageal reflux disease without esophagitis: Secondary | ICD-10-CM | POA: Diagnosis not present

## 2021-07-18 DIAGNOSIS — M47816 Spondylosis without myelopathy or radiculopathy, lumbar region: Secondary | ICD-10-CM | POA: Diagnosis not present

## 2021-07-18 DIAGNOSIS — E785 Hyperlipidemia, unspecified: Secondary | ICD-10-CM | POA: Diagnosis not present

## 2021-07-18 DIAGNOSIS — E559 Vitamin D deficiency, unspecified: Secondary | ICD-10-CM | POA: Diagnosis not present

## 2021-07-18 DIAGNOSIS — M1990 Unspecified osteoarthritis, unspecified site: Secondary | ICD-10-CM | POA: Diagnosis not present

## 2021-07-18 DIAGNOSIS — G20 Parkinson's disease: Secondary | ICD-10-CM | POA: Diagnosis not present

## 2021-07-18 DIAGNOSIS — G4733 Obstructive sleep apnea (adult) (pediatric): Secondary | ICD-10-CM | POA: Diagnosis not present

## 2021-07-18 DIAGNOSIS — Z7985 Long-term (current) use of injectable non-insulin antidiabetic drugs: Secondary | ICD-10-CM | POA: Diagnosis not present

## 2021-07-19 ENCOUNTER — Encounter: Payer: Medicare Other | Admitting: Physical Therapy

## 2021-07-19 ENCOUNTER — Telehealth (INDEPENDENT_AMBULATORY_CARE_PROVIDER_SITE_OTHER): Payer: Medicare Other | Admitting: Family Medicine

## 2021-07-19 DIAGNOSIS — F410 Panic disorder [episodic paroxysmal anxiety] without agoraphobia: Secondary | ICD-10-CM | POA: Diagnosis not present

## 2021-07-19 DIAGNOSIS — R110 Nausea: Secondary | ICD-10-CM | POA: Diagnosis not present

## 2021-07-19 DIAGNOSIS — T50905A Adverse effect of unspecified drugs, medicaments and biological substances, initial encounter: Secondary | ICD-10-CM

## 2021-07-19 MED ORDER — SERTRALINE HCL 50 MG PO TABS: 50.0000 mg | ORAL_TABLET | Freq: Every day | ORAL | Status: DC

## 2021-07-19 MED ORDER — OZEMPIC (0.25 OR 0.5 MG/DOSE) 2 MG/1.5ML ~~LOC~~ SOPN
0.5000 mg | PEN_INJECTOR | SUBCUTANEOUS | 3 refills | Status: DC
Start: 2021-07-19 — End: 2021-08-05

## 2021-07-19 NOTE — Progress Notes (Signed)
? ? ?Virtual telephone visit ? ? ? ?Virtual Visit via Telephone Note  ? ?This visit type was conducted due to national recommendations for restrictions regarding the COVID-19 Pandemic (e.g. social distancing) in an effort to limit this patient's exposure and mitigate transmission in our community. Due to her co-morbid illnesses, this patient is at least at moderate risk for complications without adequate follow up. This format is felt to be most appropriate for this patient at this time. The patient did not have access to video technology or had technical difficulties with video requiring transitioning to audio format only (telephone). Physical exam was limited to content and character of the telephone converstion.   ? ?Patient location: home ?Provider location: [facility] ? ?I discussed the limitations of evaluation and management by telemedicine and the availability of in person appointments. The patient expressed understanding and agreed to proceed.  ? ?Visit Date: 07/19/2021 ? ?Today's healthcare provider: Lelon Huh, MD  ? ?Chief Complaint  ?Patient presents with  ? Allergic Reaction  ? Nausea  ? ?Subjective  ?  ?  ?Patient reports neurologist put her on sinemet and wonders is this is having a interaction with other medications.  Reports nausea but have gotten better after reducing the sinemet from three times daily to two times daily about 4 days ago. Nausea improved a little bit. Husband reports patient is having 'terrible motor control", anxiety, loss of appetite.  ?  ? ?Medications: ?Outpatient Medications Prior to Visit  ?Medication Sig  ? atorvastatin (LIPITOR) 20 MG tablet TAKE 1 TABLET BY MOUTH EVERY EVENING  ? Cholecalciferol (VITAMIN D-3) 1000 units CAPS Take 1 capsule by mouth daily.  ? imipramine (TOFRANIL) 50 MG tablet TAKE ONE TABLET BY MOUTH TWICE DAILY  ? levothyroxine (SYNTHROID) 50 MCG tablet TAKE ONE TABLET BY MOUTH EVERY DAY  ? metoprolol succinate (TOPROL-XL) 50 MG 24 hr tablet TAKE ONE  TABLET BY MOUTH EVERY DAY WITH OR IMMEDIATELY FOLLOWING A MEAL  ? Multiple Vitamins-Minerals (MULTIVITAMIN ADULT PO) Take 1 tablet by mouth daily.  ? omeprazole (PRILOSEC) 20 MG capsule TAKE 1 CAPSULE BY MOUTH ONCE DAILY  ? Semaglutide, 1 MG/DOSE, 4 MG/3ML SOPN Inject 1 mg as directed once a week.  ? sertraline (ZOLOFT) 50 MG tablet TAKE ONE AND A HALF TABLETS BY MOUTH EVERY DAY  ? ?No facility-administered medications prior to visit.  ? ? ?Review of Systems  ?Gastrointestinal:  Positive for nausea.  ?Psychiatric/Behavioral:  The patient is nervous/anxious.   ? ? ? ? Objective  ?  ?There were no vitals taken for this visit. ? ? ?Awake, alert, oriented x 3. In no apparent distress ? ? ? Assessment & Plan  ?  ? ?1. Adverse effect of drug, initial encounter ?She feels dramatically better since stopping Sinemet two days ago, although she still has persistent nausea. Will reduce semaglutide dose to 0.'5mg'$  for now. She feels like she didn't have any problem with the starting dose Sinemet of 1/2 tab three times a day. Suggested she try that again and let Dr. Manuella Ghazi know she is doing after 3-4 weeks.  ? ?2. Nausea ?Pay be adverse effect of Sinemet which we are reducing the dose of. May also be aggravated by semaglutide which we are also reducing the dose of.  ? ?3. Panic disorder ?Reduce sertraline to '50mg'$  a day due to potential for aggravating nausea.  ? ?  ? ?I discussed the assessment and treatment plan with the patient. The patient was provided an opportunity to ask questions  and all were answered. The patient agreed with the plan and demonstrated an understanding of the instructions. ?  ?The patient was advised to call back or seek an in-person evaluation if the symptoms worsen or if the condition fails to improve as anticipated. ? ?I provided 12 minutes of non-face-to-face time during this encounter. ? ?The entirety of the information documented in the History of Present Illness, Review of Systems and Physical Exam were  personally obtained by me. Portions of this information were initially documented by the CMA and reviewed by me for thoroughness and accuracy.   ? ?Lelon Huh, MD ?[facility] ?[phone] (phone) ?[phone] (fax) ? ?[facility]   ?

## 2021-07-21 DIAGNOSIS — E785 Hyperlipidemia, unspecified: Secondary | ICD-10-CM | POA: Diagnosis not present

## 2021-07-21 DIAGNOSIS — Z7985 Long-term (current) use of injectable non-insulin antidiabetic drugs: Secondary | ICD-10-CM | POA: Diagnosis not present

## 2021-07-21 DIAGNOSIS — G4733 Obstructive sleep apnea (adult) (pediatric): Secondary | ICD-10-CM | POA: Diagnosis not present

## 2021-07-21 DIAGNOSIS — Z9089 Acquired absence of other organs: Secondary | ICD-10-CM | POA: Diagnosis not present

## 2021-07-21 DIAGNOSIS — Z905 Acquired absence of kidney: Secondary | ICD-10-CM | POA: Diagnosis not present

## 2021-07-21 DIAGNOSIS — K219 Gastro-esophageal reflux disease without esophagitis: Secondary | ICD-10-CM | POA: Diagnosis not present

## 2021-07-21 DIAGNOSIS — E039 Hypothyroidism, unspecified: Secondary | ICD-10-CM | POA: Diagnosis not present

## 2021-07-21 DIAGNOSIS — E119 Type 2 diabetes mellitus without complications: Secondary | ICD-10-CM | POA: Diagnosis not present

## 2021-07-21 DIAGNOSIS — Z9049 Acquired absence of other specified parts of digestive tract: Secondary | ICD-10-CM | POA: Diagnosis not present

## 2021-07-21 DIAGNOSIS — F32A Depression, unspecified: Secondary | ICD-10-CM | POA: Diagnosis not present

## 2021-07-21 DIAGNOSIS — M47816 Spondylosis without myelopathy or radiculopathy, lumbar region: Secondary | ICD-10-CM | POA: Diagnosis not present

## 2021-07-21 DIAGNOSIS — G20 Parkinson's disease: Secondary | ICD-10-CM | POA: Diagnosis not present

## 2021-07-21 DIAGNOSIS — I10 Essential (primary) hypertension: Secondary | ICD-10-CM | POA: Diagnosis not present

## 2021-07-21 DIAGNOSIS — Z9181 History of falling: Secondary | ICD-10-CM | POA: Diagnosis not present

## 2021-07-21 DIAGNOSIS — Z85828 Personal history of other malignant neoplasm of skin: Secondary | ICD-10-CM | POA: Diagnosis not present

## 2021-07-21 DIAGNOSIS — E559 Vitamin D deficiency, unspecified: Secondary | ICD-10-CM | POA: Diagnosis not present

## 2021-07-21 DIAGNOSIS — M1990 Unspecified osteoarthritis, unspecified site: Secondary | ICD-10-CM | POA: Diagnosis not present

## 2021-07-24 ENCOUNTER — Encounter: Payer: Medicare Other | Admitting: Physical Therapy

## 2021-07-24 DIAGNOSIS — M47816 Spondylosis without myelopathy or radiculopathy, lumbar region: Secondary | ICD-10-CM | POA: Diagnosis not present

## 2021-07-24 DIAGNOSIS — E559 Vitamin D deficiency, unspecified: Secondary | ICD-10-CM | POA: Diagnosis not present

## 2021-07-24 DIAGNOSIS — K219 Gastro-esophageal reflux disease without esophagitis: Secondary | ICD-10-CM | POA: Diagnosis not present

## 2021-07-24 DIAGNOSIS — Z905 Acquired absence of kidney: Secondary | ICD-10-CM | POA: Diagnosis not present

## 2021-07-24 DIAGNOSIS — Z9089 Acquired absence of other organs: Secondary | ICD-10-CM | POA: Diagnosis not present

## 2021-07-24 DIAGNOSIS — M1990 Unspecified osteoarthritis, unspecified site: Secondary | ICD-10-CM | POA: Diagnosis not present

## 2021-07-24 DIAGNOSIS — F32A Depression, unspecified: Secondary | ICD-10-CM | POA: Diagnosis not present

## 2021-07-24 DIAGNOSIS — Z9049 Acquired absence of other specified parts of digestive tract: Secondary | ICD-10-CM | POA: Diagnosis not present

## 2021-07-24 DIAGNOSIS — E119 Type 2 diabetes mellitus without complications: Secondary | ICD-10-CM | POA: Diagnosis not present

## 2021-07-24 DIAGNOSIS — Z9181 History of falling: Secondary | ICD-10-CM | POA: Diagnosis not present

## 2021-07-24 DIAGNOSIS — I10 Essential (primary) hypertension: Secondary | ICD-10-CM | POA: Diagnosis not present

## 2021-07-24 DIAGNOSIS — Z85828 Personal history of other malignant neoplasm of skin: Secondary | ICD-10-CM | POA: Diagnosis not present

## 2021-07-24 DIAGNOSIS — E039 Hypothyroidism, unspecified: Secondary | ICD-10-CM | POA: Diagnosis not present

## 2021-07-24 DIAGNOSIS — G20 Parkinson's disease: Secondary | ICD-10-CM | POA: Diagnosis not present

## 2021-07-24 DIAGNOSIS — Z7985 Long-term (current) use of injectable non-insulin antidiabetic drugs: Secondary | ICD-10-CM | POA: Diagnosis not present

## 2021-07-24 DIAGNOSIS — G4733 Obstructive sleep apnea (adult) (pediatric): Secondary | ICD-10-CM | POA: Diagnosis not present

## 2021-07-24 DIAGNOSIS — E785 Hyperlipidemia, unspecified: Secondary | ICD-10-CM | POA: Diagnosis not present

## 2021-07-26 ENCOUNTER — Encounter: Payer: Medicare Other | Admitting: Physical Therapy

## 2021-07-26 ENCOUNTER — Other Ambulatory Visit: Payer: Self-pay | Admitting: Family Medicine

## 2021-07-26 DIAGNOSIS — E785 Hyperlipidemia, unspecified: Secondary | ICD-10-CM | POA: Diagnosis not present

## 2021-07-26 DIAGNOSIS — Z7985 Long-term (current) use of injectable non-insulin antidiabetic drugs: Secondary | ICD-10-CM | POA: Diagnosis not present

## 2021-07-26 DIAGNOSIS — Z85828 Personal history of other malignant neoplasm of skin: Secondary | ICD-10-CM | POA: Diagnosis not present

## 2021-07-26 DIAGNOSIS — M47816 Spondylosis without myelopathy or radiculopathy, lumbar region: Secondary | ICD-10-CM | POA: Diagnosis not present

## 2021-07-26 DIAGNOSIS — E119 Type 2 diabetes mellitus without complications: Secondary | ICD-10-CM | POA: Diagnosis not present

## 2021-07-26 DIAGNOSIS — Z9049 Acquired absence of other specified parts of digestive tract: Secondary | ICD-10-CM | POA: Diagnosis not present

## 2021-07-26 DIAGNOSIS — E039 Hypothyroidism, unspecified: Secondary | ICD-10-CM

## 2021-07-26 DIAGNOSIS — E559 Vitamin D deficiency, unspecified: Secondary | ICD-10-CM | POA: Diagnosis not present

## 2021-07-26 DIAGNOSIS — Z905 Acquired absence of kidney: Secondary | ICD-10-CM | POA: Diagnosis not present

## 2021-07-26 DIAGNOSIS — G20 Parkinson's disease: Secondary | ICD-10-CM | POA: Diagnosis not present

## 2021-07-26 DIAGNOSIS — Z9181 History of falling: Secondary | ICD-10-CM | POA: Diagnosis not present

## 2021-07-26 DIAGNOSIS — M1990 Unspecified osteoarthritis, unspecified site: Secondary | ICD-10-CM | POA: Diagnosis not present

## 2021-07-26 DIAGNOSIS — K219 Gastro-esophageal reflux disease without esophagitis: Secondary | ICD-10-CM | POA: Diagnosis not present

## 2021-07-26 DIAGNOSIS — F32A Depression, unspecified: Secondary | ICD-10-CM | POA: Diagnosis not present

## 2021-07-26 DIAGNOSIS — I10 Essential (primary) hypertension: Secondary | ICD-10-CM | POA: Diagnosis not present

## 2021-07-26 DIAGNOSIS — Z9089 Acquired absence of other organs: Secondary | ICD-10-CM | POA: Diagnosis not present

## 2021-07-26 DIAGNOSIS — F410 Panic disorder [episodic paroxysmal anxiety] without agoraphobia: Secondary | ICD-10-CM

## 2021-07-26 DIAGNOSIS — G4733 Obstructive sleep apnea (adult) (pediatric): Secondary | ICD-10-CM | POA: Diagnosis not present

## 2021-07-27 ENCOUNTER — Ambulatory Visit: Payer: Self-pay | Admitting: *Deleted

## 2021-07-27 NOTE — Telephone Encounter (Signed)
Rose called back to give update- she left call back number [phone] agent- attempted to call her back and got VM. Left message to call office if needed. ?

## 2021-07-27 NOTE — Telephone Encounter (Signed)
?  Chief Complaint: not eating/drinking ?Symptoms: weakness, not eating/drinking, weight loss ?Frequency: ongoing- 6 weeks ?Pertinent Negatives: Patient denies   ?Disposition: '[x]'$ ED /'[]'$ Urgent Care (no appt availability in office) / '[]'$ Appointment(In office/virtual)/ '[]'$  [place] [facility]/ '[]'$ Home Care/ '[]'$ Refused Recommended Disposition /'[]'$ [facility]/ '[]'$  Follow-up with PCP ?Additional Notes: Patient's family is concerned - patient has not ben able to eat for 3 days- not taking in enough fluids. Per protocol advised 911/ED.  ?

## 2021-07-27 NOTE — Telephone Encounter (Signed)
Office closed for lunch- unable to call- will send high priority ?

## 2021-07-27 NOTE — Telephone Encounter (Signed)
6 weeks- Parkinson's medication- causing nausea- they are working to get that under control. ?Patient is isolated and not eating/drinking ?Reason for Disposition ? Severe weakness (i.e., unable to walk or barely able to walk, requires support) ? ?Answer Assessment - Initial Assessment Questions ?1. CONCERN: "What happened that made you call today?" ?    Concerned about dehydration and malnutrition ?2. ONSET: "When did the symptoms begin?" (e.g., minutes, hours or days ago) ?    Patient has new diagnosis of Parkinson- patient has had nausea 6 weeks  ?3. UNDERWEIGHT: "Are you losing weight?" "Is the weight loss intentional?" ?    Yes- not how much ?4. OVERWEIGHT: "Do you feel you are gaining too much weight?" ?    no ?5. FOOD INTAKE: "Do you worry about what you eat? How much you eat?" ?    Family reports patient has not eaten anything for 3 days, 16 oz if that of water ?6. EXERCISE:  "Do you exercise?" If Yes, ask: "What type of exercise do you do?" ?    unable ?7. CURRENT WEIGHT: "How much do you weigh?"   ?     Unable to weigh ?8. CURRENT HEIGHT: "How tall are you?" ?      ?9. BMI: "What is your BMI (body mass index)?" Note: The triager can use the BMI calculator on the CDC website at http://osborn.com/.  ?  - Underweight: Below 18.5 ?  - Normal, healthy weight: 18.5 - 24.9 ?  - Overweight: 25.0 - 29.9 ?  - Obese: 30 or above ?      ?10. MEDICINES: "Do you routinely use medicines (laxatives, diuretics, enemas) to help control your weight?" ?      No- bowels have only moved once in 3 days ?11. OTHER SYMPTOMS: "What symptoms are you currently experiencing?" (e.g., none; abnormal heart rate, blackout spells, shakiness, suicidal thoughts, vomiting) ?      Nausea ?12. PREGNANCY: "Is there any chance you are pregnant?" "When was your last menstrual period?" ? ?Protocols used: Eating Disorders Symptoms and Questions-A-AH ? ?

## 2021-07-28 ENCOUNTER — Telehealth: Payer: Self-pay | Admitting: *Deleted

## 2021-07-28 ENCOUNTER — Emergency Department
Admission: EM | Admit: 2021-07-28 | Discharge: 2021-07-28 | Disposition: A | Discharge status: Home / Self Care | Payer: Medicare Other | Attending: Emergency Medicine | Admitting: Emergency Medicine

## 2021-07-28 ENCOUNTER — Ambulatory Visit: Payer: Self-pay

## 2021-07-28 ENCOUNTER — Emergency Department: Payer: Medicare Other

## 2021-07-28 DIAGNOSIS — Z743 Need for continuous supervision: Secondary | ICD-10-CM | POA: Diagnosis not present

## 2021-07-28 DIAGNOSIS — R531 Weakness: Secondary | ICD-10-CM | POA: Diagnosis not present

## 2021-07-28 DIAGNOSIS — E860 Dehydration: Secondary | ICD-10-CM | POA: Insufficient documentation

## 2021-07-28 DIAGNOSIS — Z7401 Bed confinement status: Secondary | ICD-10-CM | POA: Diagnosis not present

## 2021-07-28 DIAGNOSIS — E871 Hypo-osmolality and hyponatremia: Secondary | ICD-10-CM | POA: Diagnosis not present

## 2021-07-28 DIAGNOSIS — R5381 Other malaise: Secondary | ICD-10-CM | POA: Diagnosis not present

## 2021-07-28 DIAGNOSIS — R110 Nausea: Secondary | ICD-10-CM | POA: Diagnosis not present

## 2021-07-28 DIAGNOSIS — G20 Parkinson's disease: Secondary | ICD-10-CM | POA: Diagnosis not present

## 2021-07-28 DIAGNOSIS — R69 Illness, unspecified: Secondary | ICD-10-CM | POA: Diagnosis not present

## 2021-07-28 DIAGNOSIS — Z20822 Contact with and (suspected) exposure to covid-19: Secondary | ICD-10-CM | POA: Insufficient documentation

## 2021-07-28 DIAGNOSIS — R718 Other abnormality of red blood cells: Secondary | ICD-10-CM | POA: Diagnosis not present

## 2021-07-28 DIAGNOSIS — R824 Acetonuria: Secondary | ICD-10-CM | POA: Insufficient documentation

## 2021-07-28 DIAGNOSIS — E876 Hypokalemia: Secondary | ICD-10-CM | POA: Insufficient documentation

## 2021-07-28 LAB — URINALYSIS, ROUTINE W REFLEX MICROSCOPIC
Bacteria, UA: NONE SEEN
Bilirubin Urine: NEGATIVE
Glucose, UA: NEGATIVE mg/dL
Ketones, ur: 80 mg/dL — AB
Leukocytes,Ua: NEGATIVE
Nitrite: NEGATIVE
Protein, ur: NEGATIVE mg/dL
Specific Gravity, Urine: 1.017 (ref 1.005–1.030)
pH: 5 (ref 5.0–8.0)

## 2021-07-28 LAB — COMPREHENSIVE METABOLIC PANEL
ALT: 12 U/L (ref 0–44)
AST: 29 U/L (ref 15–41)
Albumin: 4.1 g/dL (ref 3.5–5.0)
Alkaline Phosphatase: 95 U/L (ref 38–126)
Anion gap: 19 — ABNORMAL HIGH (ref 5–15)
BUN: 8 mg/dL (ref 8–23)
CO2: 21 mmol/L — ABNORMAL LOW (ref 22–32)
Calcium: 9.6 mg/dL (ref 8.9–10.3)
Chloride: 94 mmol/L — ABNORMAL LOW (ref 98–111)
Creatinine, Ser: 0.89 mg/dL (ref 0.44–1.00)
GFR, Estimated: >60 mL/min (ref >60)
Glucose, Bld: 82 mg/dL (ref 70–99)
Potassium: 3.1 mmol/L — ABNORMAL LOW (ref 3.5–5.1)
Sodium: 134 mmol/L — ABNORMAL LOW (ref 135–145)
Total Bilirubin: 1.5 mg/dL — ABNORMAL HIGH (ref 0.3–1.2)
Total Protein: 7.9 g/dL (ref 6.5–8.1)

## 2021-07-28 LAB — CBC WITH DIFFERENTIAL/PLATELET
Abs Immature Granulocytes: 0.07 10*3/uL (ref 0.00–0.07)
Basophils Absolute: 0.1 10*3/uL (ref 0.0–0.1)
Basophils Relative: 1 %
Eosinophils Absolute: 0.1 10*3/uL (ref 0.0–0.5)
Eosinophils Relative: 1 %
HCT: 47 % — ABNORMAL HIGH (ref 36.0–46.0)
Hemoglobin: 15.3 g/dL — ABNORMAL HIGH (ref 12.0–15.0)
Immature Granulocytes: 1 %
Lymphocytes Relative: 22 %
Lymphs Abs: 1.8 10*3/uL (ref 0.7–4.0)
MCH: 25.1 pg — ABNORMAL LOW (ref 26.0–34.0)
MCHC: 32.6 g/dL (ref 30.0–36.0)
MCV: 77 fL — ABNORMAL LOW (ref 80.0–100.0)
Monocytes Absolute: 1.1 10*3/uL — ABNORMAL HIGH (ref 0.1–1.0)
Monocytes Relative: 14 %
Neutro Abs: 5 10*3/uL (ref 1.7–7.7)
Neutrophils Relative %: 61 %
Platelets: 334 10*3/uL (ref 150–400)
RBC: 6.1 MIL/uL — ABNORMAL HIGH (ref 3.87–5.11)
RDW: 16.8 % — ABNORMAL HIGH (ref 11.5–15.5)
WBC: 8.2 10*3/uL (ref 4.0–10.5)
nRBC: 0 % (ref 0.0–0.2)

## 2021-07-28 LAB — LACTIC ACID, PLASMA: Lactic Acid, Venous: 1.1 mmol/L (ref 0.5–1.9)

## 2021-07-28 LAB — MAGNESIUM: Magnesium: 1.6 mg/dL — ABNORMAL LOW (ref 1.7–2.4)

## 2021-07-28 LAB — LIPASE, BLOOD: Lipase: 40 U/L (ref 11–51)

## 2021-07-28 LAB — RESP PANEL BY RT-PCR (FLU A&B, COVID) ARPGX2
Influenza A by PCR: NEGATIVE
Influenza B by PCR: NEGATIVE
SARS Coronavirus 2 by RT PCR: NEGATIVE

## 2021-07-28 LAB — TSH: TSH: 2.586 u[IU]/mL (ref 0.350–4.500)

## 2021-07-28 MED ORDER — MAGNESIUM SULFATE 2 GM/50ML IV SOLN
2.0000 g | Freq: Once | INTRAVENOUS | Status: AC
  Administered 2021-07-28: 2 g via INTRAVENOUS
  Filled 2021-07-28: qty 50

## 2021-07-28 MED ORDER — ONDANSETRON HCL 4 MG/2ML IJ SOLN
4.0000 mg | Freq: Once | INTRAMUSCULAR | Status: AC
  Administered 2021-07-28: 4 mg via INTRAVENOUS
  Filled 2021-07-28: qty 2

## 2021-07-28 MED ORDER — POTASSIUM CHLORIDE CRYS ER 20 MEQ PO TBCR
40.0000 meq | EXTENDED_RELEASE_TABLET | Freq: Once | ORAL | Status: AC
  Administered 2021-07-28: 40 meq via ORAL
  Filled 2021-07-28: qty 2

## 2021-07-28 MED ORDER — SODIUM CHLORIDE 0.9 % IV BOLUS
1000.0000 mL | Freq: Once | INTRAVENOUS | Status: AC
  Administered 2021-07-28: 1000 mL via INTRAVENOUS

## 2021-07-28 MED ORDER — LACTATED RINGERS IV BOLUS
1000.0000 mL | Freq: Once | INTRAVENOUS | Status: AC
  Administered 2021-07-28: 1000 mL via INTRAVENOUS

## 2021-07-28 MED ORDER — ONDANSETRON HCL 4 MG PO TABS: 4.0000 mg | ORAL_TABLET | Freq: Three times a day (TID) | ORAL | 0 refills | Status: DC | PRN

## 2021-07-28 NOTE — ED Triage Notes (Signed)
Not eating and drinking , failure to thrive , no other complaints , pt has parkinson's  ?

## 2021-07-28 NOTE — ED Provider Notes (Signed)
I assumed care of this patient at approximately 8 PM.  Please see outgoing progress note for full details surrounding patient's initial evaluation assessment.  In brief patient presents for evaluation of some weakness and decreased appetite.  She states she was having significant nausea and vomiting when taking Sinemet for Parkinson's and she subsequently stopped this and no longer feels nauseous but has had little appetite.  He states she think she developed nausea association with the Sinemet.  She denies any other acute complaints including abdominal pain, diarrhea, urinary symptoms chest pain cough or fever.  No recent falls. ? ?CMP remarkable for K of 3.1 and bicarb of 21 with anion gap of 19 without any other significant electrolyte or metabolic derangements.  Lactic acid is not elevated at 1.1.  UA has some ketones and hemoglobin but suspect acute anemia and starvation ketosis is likely the source of her mild anion gap acidosis.  CMP otherwise has no evidence of uremia.  CBC without leukocytosis and hemoglobin of 15.3.  TSH is within normal limits.  Magnesium 1.6.  COVID influenza PCR negative ? ?Patient given 2 L of IV fluids and her magnesium and potassium were repleted.  On reassessment she is feeling drastically much better.  He is able to tolerate p.o.  I suspect her generalized weakness related to dehydration.  She states she is feeling much better now I think she can probably stay hydrated as she has been off the Sinemet for couple days and her nausea has been getting better.  She had some difficulty providing urine sample and in and out catheter was obtained for UA noted above.  She had approximately 6 300 cc of urine and after this had 0 urine in her bladder.  Discussed that this is slightly above range of normal that I thought it was reasonable for a trial voiding at home in the next 24 to 40 hours but that if she is not urinating she will likely need to return for placement of the catheter for  tension think placement of Foley catheter is emergently indicated.  She is amenable this plan.  Will write Rx for Zofran to help her stay hydrated at home.  Discussed importance of follow-up on Monday with her PCP to have her electrolytes rechecked.  Discharged in stable condition.  Strict return precautions advised and discussed. ?  ?Lucrezia Starch, MD ?07/28/21 2155 ? ?

## 2021-07-28 NOTE — Telephone Encounter (Signed)
Rose is calling with update: ?Patient had gotten herself stuck on toilet and could not get up- EMS was called yesterday-they advised all was normal-EKG, BP,etc and advised liquid IV. Patient has not been able to drink this. Patient is still not eating. They are going to reconsider taking her to ED. ?

## 2021-07-28 NOTE — Telephone Encounter (Signed)
Patients husband reports they are heading to hospital today.  ?

## 2021-07-28 NOTE — Telephone Encounter (Signed)
So did she go to the ED as advised or do we need to see if we have any same day slots open today with any provider? ?

## 2021-07-28 NOTE — ED Notes (Signed)
Bladder scan- 0 ML ?

## 2021-07-28 NOTE — Telephone Encounter (Signed)
?  Chief Complaint: medication advice ?Symptoms: decreased appetite ?Frequency:  ?Pertinent Negatives: NA ?Disposition: '[]'$ ED /'[]'$ Urgent Care (no appt availability in office) / '[]'$ Appointment(In office/virtual)/ '[]'$  [facility]/ '[]'$ Home Care/ '[]'$ Refused Recommended Disposition /'[]'$  Mobile Bus/ '[x]'$  Follow-up with PCP ?Additional Notes: pt's husband asking if pt could stop the Ozempic without having to taper down to the 0.'25mg'$  dose. I advised him since she was taking this for weight loss and not for DM it should be ok for her to not take it anymore but I would send to provider to double check. Pt would like a call back if he needs to continue at the 0.'25mg'$  dose.  ? ?Summary: medication question  ? Pts husband called in about medications Ozempic stating pt is wanting to get off of it and how to go about that, please advise.   ?  ? ? ?Reason for Disposition ? [1] Caller has NON-URGENT medicine question about med that PCP prescribed AND [2] triager unable to answer question ? ?Answer Assessment - Initial Assessment Questions ?1. NAME of MEDICATION: "What medicine are you calling about?" ?    Ozempic  ?2. QUESTION: "What is your question?" (e.g., double dose of medicine, side effect) ?    Wanting to stop medication  ?3. PRESCRIBING HCP: "Who prescribed it?" Reason: if prescribed by specialist, call should be referred to that group. ?    Dr. Caryn Section ?4. SYMPTOMS: "Do you have any symptoms?" ?    Decreased appetite ? ?Protocols used: Medication Question Call-A-AH ? ?

## 2021-07-28 NOTE — ED Provider Notes (Signed)
? ?[facility] ?Provider Note ? ? ? Event Date/Time  ? First MD Initiated Contact with Patient 07/28/21 1537   ?  (approximate) ? ? ?History  ? ?Failure To Thrive (Not eating and drinking , failure to thrive , no other complaints , pt has parkinson's ) ? ? ?HPI ? ?Brittany Benton is a 66 y.o. female who presents to the emergency department for treatment and evaluation of inability to tolerate food and fluids for the past 3 days. No vomiting. No diarrhea. "Dry heaving" per patient and husband. They feel this is related to Sinemet.  Patient states that that medication made her feel "foggy headed and very nauseated to the point where she did not even want to put food into her mouth.  She has not had any Zofran or nausea medicine to try at home.  Husband states that they did try meclizine but that did not work. ?  ? ? ?Physical Exam  ? ?Triage Vital Signs: ?ED Triage Vitals  ?Enc Vitals Group  ?   BP 07/28/21 1541 (!) 82/65  ?   Pulse Rate 07/28/21 1540 90  ?   Resp 07/28/21 1540 17  ?   Temp 07/28/21 1540 98.5 ?F (36.9 ?C)  ?   Temp Source 07/28/21 1540 Oral  ?   SpO2 07/28/21 1540 98 %  ?   Weight 07/28/21 1540 196 lb 3.4 oz (89 kg)  ?   Height 07/28/21 1540 '5\' 3"'$  (1.6 m)  ?   Head Circumference --   ?   Peak Flow --   ?   Pain Score --   ?   Pain Loc --   ?   Pain Edu? --   ?   Excl. in [place]? --   ? ? ?Most recent vital signs: ?Vitals:  ? 07/28/21 2146 07/28/21 2256  ?BP: 128/69 121/72  ?Pulse: 85 85  ?Resp: 20 20  ?Temp:  (!) 97.5 ?F (36.4 ?C)  ?SpO2: 98% 100%  ? ? ? ?General: Awake, no distress.  ?CV:  Good peripheral perfusion.  ?Resp:  Normal effort.  ?Abd:  No distention.  ?Other:  Skin turgor is normal.  Mucous membranes are normal. ? ? ?ED Results / Procedures / Treatments  ? ?Labs ?(all labs ordered are listed, but only abnormal results are displayed) ?Labs Reviewed  ?COMPREHENSIVE METABOLIC PANEL - Abnormal; Notable for the following components:  ?    Result Value  ? Sodium 134 (*)   ?  Potassium 3.1 (*)   ? Chloride 94 (*)   ? CO2 21 (*)   ? Total Bilirubin 1.5 (*)   ? Anion gap 19 (*)   ? All other components within normal limits  ?CBC WITH DIFFERENTIAL/PLATELET - Abnormal; Notable for the following components:  ? RBC 6.10 (*)   ? Hemoglobin 15.3 (*)   ? HCT 47.0 (*)   ? MCV 77.0 (*)   ? MCH 25.1 (*)   ? RDW 16.8 (*)   ? Monocytes Absolute 1.1 (*)   ? All other components within normal limits  ?URINALYSIS, ROUTINE W REFLEX MICROSCOPIC - Abnormal; Notable for the following components:  ? Color, Urine YELLOW (*)   ? APPearance CLOUDY (*)   ? Hgb urine dipstick MODERATE (*)   ? Ketones, ur 80 (*)   ? All other components within normal limits  ?MAGNESIUM - Abnormal; Notable for the following components:  ? Magnesium 1.6 (*)   ? All other components within  normal limits  ?RESP PANEL BY RT-PCR (FLU A&B, COVID) ARPGX2  ?LIPASE, BLOOD  ?LACTIC ACID, PLASMA  ?TSH  ? ? ? ?EKG ? ? ? ? ?RADIOLOGY ? ?No acute cardiopulmonary abnormality ? ?PROCEDURES: ? ?Critical Care performed: No ? ?Procedures ? ? ?MEDICATIONS ORDERED IN ED: ?Medications  ?sodium chloride 0.9 % bolus 1,000 mL (0 mLs Intravenous Stopped 07/28/21 1857)  ?lactated ringers bolus 1,000 mL (0 mLs Intravenous Stopped 07/28/21 1857)  ?ondansetron [facility]) injection 4 mg (4 mg Intravenous Given 07/28/21 1718)  ?potassium chloride SA (KLOR-CON M) CR tablet 40 mEq (40 mEq Oral Given 07/28/21 1718)  ?magnesium sulfate IVPB 2 g 50 mL (0 g Intravenous Stopped 07/28/21 2016)  ? ? ? ?IMPRESSION / MDM / ASSESSMENT AND PLAN / ED COURSE  ?I reviewed the triage vital signs and the nursing notes. ?             ?               ? ?Differential diagnosis includes, but is not limited to medication reaction, intra-abdominal pathology/infection, failure to thrive, Covid, influenza ? ?CBC shows mildly elevated hemoglobin and hematocrit 15.3 and 47., but is otherwise unremarkable.  Mild hypokalemia 3.1 with sodium of 134, normal glucose, normal BUN and creatinine, normal  AST ALT.  Lactic acid is normal.  Lipase is normal.  TSH is normal.  COVID and influenza testing is negative. ? ?---------------------------------------- ?6:50 PM on 07/28/2021 ?----------------------------------------- ?Current BP is 93/69 with heart rate of 88. Patient less nauseated after zofran and would like ice chips. She was also able to take the potassium when dissolved in apple juice.  ? ?----------------------------------------- ?7:31 PM on 07/28/2021 ?----------------------------------------- ?Awaiting urinalysis. Care relinquished to Dr. Charlann Boxer who will follow up on results and determine disposition. ?  ? ? ?FINAL CLINICAL IMPRESSION(S) / ED DIAGNOSES  ? ?Final diagnoses:  ?Nausea  ?Dehydration  ?Hypomagnesemia  ?Hypokalemia  ? ? ? ?Rx / DC Orders  ? ?ED Discharge Orders   ? ?      Ordered  ?  ondansetron (ZOFRAN) 4 MG tablet  Every 8 hours PRN       ? 07/28/21 2152  ? ?  ?  ? ?  ? ? ? ?Note:  This document was prepared using Dragon voice recognition software and may include unintentional dictation errors. ?  ?Victorino Dike, FNP ?07/29/21 1955 ? ?  ?Lucrezia Starch, MD ?07/30/21 0157 ? ?

## 2021-07-28 NOTE — Telephone Encounter (Signed)
Spoke with patients husband and advised him that Dr. Caryn Section is not in office today and that I will forward message to PA to review as request. Patients husband states that  patient was diagnosed with Parkinson and since has had little to no appetite, he would like patient to stop Ozempic effective tomorrow to improve appetite. He wants to know if this is okay? KW ?

## 2021-07-28 NOTE — ED Notes (Signed)
[facility] called for transport. 2nd line ?

## 2021-07-31 ENCOUNTER — Encounter: Payer: Medicare Other | Admitting: Physical Therapy

## 2021-07-31 NOTE — Telephone Encounter (Signed)
Rose called and stated that pt did go to the ER and her electrolytes were really low and Rose asked if the pt needs to come in to see Dr. Caryn Section or if she should go to her Neurologist / please advise  ? ?Rose scheduled an appt for tomorrow but would like a call today to speak with nurse about some things / please advise asap  ?

## 2021-08-01 ENCOUNTER — Encounter: Payer: Self-pay | Admitting: Family Medicine

## 2021-08-01 ENCOUNTER — Ambulatory Visit
Admission: RE | Admit: 2021-08-01 | Discharge: 2021-08-01 | Disposition: A | Discharge status: Home / Self Care | Payer: Medicare Other | Source: Ambulatory Visit | Attending: Family Medicine | Admitting: Family Medicine

## 2021-08-01 ENCOUNTER — Ambulatory Visit (INDEPENDENT_AMBULATORY_CARE_PROVIDER_SITE_OTHER): Payer: Medicare Other | Admitting: Family Medicine

## 2021-08-01 ENCOUNTER — Ambulatory Visit
Admission: RE | Admit: 2021-08-01 | Discharge: 2021-08-01 | Disposition: A | Discharge status: Home / Self Care | Payer: Medicare Other | Attending: Family Medicine | Admitting: Family Medicine

## 2021-08-01 ENCOUNTER — Ambulatory Visit
Admission: RE | Admit: 2021-08-01 | Discharge: 2021-08-01 | Disposition: A | Discharge status: Home / Self Care | Payer: Medicare Other | Source: Ambulatory Visit | Attending: *Deleted | Admitting: *Deleted

## 2021-08-01 VITALS — BP 102/70 | HR 101 | Temp 97.5°F | Resp 14

## 2021-08-01 DIAGNOSIS — K219 Gastro-esophageal reflux disease without esophagitis: Secondary | ICD-10-CM | POA: Diagnosis not present

## 2021-08-01 DIAGNOSIS — M47816 Spondylosis without myelopathy or radiculopathy, lumbar region: Secondary | ICD-10-CM | POA: Diagnosis not present

## 2021-08-01 DIAGNOSIS — E860 Dehydration: Secondary | ICD-10-CM | POA: Diagnosis not present

## 2021-08-01 DIAGNOSIS — E8729 Other acidosis: Secondary | ICD-10-CM | POA: Diagnosis not present

## 2021-08-01 DIAGNOSIS — Z7985 Long-term (current) use of injectable non-insulin antidiabetic drugs: Secondary | ICD-10-CM | POA: Diagnosis not present

## 2021-08-01 DIAGNOSIS — R627 Adult failure to thrive: Secondary | ICD-10-CM | POA: Diagnosis not present

## 2021-08-01 DIAGNOSIS — Z9089 Acquired absence of other organs: Secondary | ICD-10-CM | POA: Diagnosis not present

## 2021-08-01 DIAGNOSIS — R5383 Other fatigue: Secondary | ICD-10-CM | POA: Insufficient documentation

## 2021-08-01 DIAGNOSIS — M1990 Unspecified osteoarthritis, unspecified site: Secondary | ICD-10-CM | POA: Diagnosis not present

## 2021-08-01 DIAGNOSIS — Z9049 Acquired absence of other specified parts of digestive tract: Secondary | ICD-10-CM | POA: Diagnosis not present

## 2021-08-01 DIAGNOSIS — G252 Other specified forms of tremor: Secondary | ICD-10-CM

## 2021-08-01 DIAGNOSIS — E11649 Type 2 diabetes mellitus with hypoglycemia without coma: Secondary | ICD-10-CM | POA: Diagnosis not present

## 2021-08-01 DIAGNOSIS — E878 Other disorders of electrolyte and fluid balance, not elsewhere classified: Secondary | ICD-10-CM | POA: Diagnosis not present

## 2021-08-01 DIAGNOSIS — G4733 Obstructive sleep apnea (adult) (pediatric): Secondary | ICD-10-CM | POA: Diagnosis not present

## 2021-08-01 DIAGNOSIS — I878 Other specified disorders of veins: Secondary | ICD-10-CM | POA: Diagnosis not present

## 2021-08-01 DIAGNOSIS — E861 Hypovolemia: Secondary | ICD-10-CM | POA: Diagnosis not present

## 2021-08-01 DIAGNOSIS — F32A Depression, unspecified: Secondary | ICD-10-CM | POA: Diagnosis not present

## 2021-08-01 DIAGNOSIS — R633 Feeding difficulties, unspecified: Secondary | ICD-10-CM | POA: Diagnosis not present

## 2021-08-01 DIAGNOSIS — E119 Type 2 diabetes mellitus without complications: Secondary | ICD-10-CM | POA: Diagnosis not present

## 2021-08-01 DIAGNOSIS — E559 Vitamin D deficiency, unspecified: Secondary | ICD-10-CM | POA: Diagnosis not present

## 2021-08-01 DIAGNOSIS — Z9181 History of falling: Secondary | ICD-10-CM | POA: Diagnosis not present

## 2021-08-01 DIAGNOSIS — N179 Acute kidney failure, unspecified: Secondary | ICD-10-CM | POA: Diagnosis not present

## 2021-08-01 DIAGNOSIS — E876 Hypokalemia: Secondary | ICD-10-CM | POA: Diagnosis not present

## 2021-08-01 DIAGNOSIS — R630 Anorexia: Secondary | ICD-10-CM

## 2021-08-01 DIAGNOSIS — R110 Nausea: Secondary | ICD-10-CM | POA: Insufficient documentation

## 2021-08-01 DIAGNOSIS — Z85828 Personal history of other malignant neoplasm of skin: Secondary | ICD-10-CM | POA: Diagnosis not present

## 2021-08-01 DIAGNOSIS — G20 Parkinson's disease: Secondary | ICD-10-CM | POA: Diagnosis not present

## 2021-08-01 DIAGNOSIS — R000 Tachycardia, unspecified: Secondary | ICD-10-CM | POA: Diagnosis not present

## 2021-08-01 DIAGNOSIS — E871 Hypo-osmolality and hyponatremia: Secondary | ICD-10-CM | POA: Diagnosis not present

## 2021-08-01 DIAGNOSIS — I10 Essential (primary) hypertension: Secondary | ICD-10-CM | POA: Diagnosis not present

## 2021-08-01 DIAGNOSIS — E785 Hyperlipidemia, unspecified: Secondary | ICD-10-CM | POA: Diagnosis not present

## 2021-08-01 DIAGNOSIS — E872 Acidosis, unspecified: Secondary | ICD-10-CM | POA: Diagnosis not present

## 2021-08-01 DIAGNOSIS — E669 Obesity, unspecified: Secondary | ICD-10-CM | POA: Diagnosis not present

## 2021-08-01 DIAGNOSIS — Z8262 Family history of osteoporosis: Secondary | ICD-10-CM | POA: Diagnosis not present

## 2021-08-01 DIAGNOSIS — Z743 Need for continuous supervision: Secondary | ICD-10-CM | POA: Diagnosis not present

## 2021-08-01 DIAGNOSIS — R338 Other retention of urine: Secondary | ICD-10-CM | POA: Diagnosis not present

## 2021-08-01 DIAGNOSIS — Z905 Acquired absence of kidney: Secondary | ICD-10-CM | POA: Diagnosis not present

## 2021-08-01 DIAGNOSIS — Z8249 Family history of ischemic heart disease and other diseases of the circulatory system: Secondary | ICD-10-CM | POA: Diagnosis not present

## 2021-08-01 DIAGNOSIS — Z96641 Presence of right artificial hip joint: Secondary | ICD-10-CM | POA: Diagnosis not present

## 2021-08-01 DIAGNOSIS — E039 Hypothyroidism, unspecified: Secondary | ICD-10-CM | POA: Diagnosis not present

## 2021-08-01 NOTE — Progress Notes (Signed)
?  ? ? ?I,Jana Robinson,acting as a scribe for Lelon Huh, MD.,have documented all relevant documentation on the behalf of Lelon Huh, MD,as directed by  Lelon Huh, MD while in the presence of Lelon Huh, MD. ? ?Established patient visit ? ? ?Patient: Brittany Benton   DOB: Aug 05, 1955   66 y.o. Female  MRN: [id number] ?Visit Date: 08/01/2021 ? ?Today's healthcare provider: Lelon Huh, MD  ?Follow up Hospitalization ? ?Patient was admitted to [facility] on 07-28-21 and discharged on 07-28-21. ?She was treated for nausea, dehydration . ?Treatment for this included given 2 L of IV fluids and her magnesium and potassium were repleted.  She initially felt much better after hydration, but today states she remains extremely weak and unable to stand or walk without assistance. Has very poor appetite and eating little if anything, but is trying to keep hydrated.  ?She states she started feeling poorly after being prescribed Sinemet by neurology for presumed Parkinson's disease in February. While taking Sinemet she became so weak that she became bed bound. She improved a bit when dose was reduced, but has since stopped medication all together with further improvement, but is still much weaker than she was prior to taking medication and mostly confined to wheelchair.  ? ?Of note is that she is on chronic sertraline and imipramine for anxiety, but sertraline dose has been cut back over the last few months and she frequently misses doses of both medications. She had also been on SQ semaglutide which was reduced on 4-5 and she has now stopped completely for the last few weeks.  ? ?----------------------------------------------------------------------------------------- -  ?Chief Complaint  ?Patient presents with  ? Follow-up  ?  ER   ? ?Subjective  ?  ?HPI ?HPI   ? ? Follow-up   ? Additional comments: ER  ? ?  ?  ?Last edited by Alanson Puls, CMA on 08/01/2021  2:55 PM.  ?  ?  ? ? ?Medications: ?Outpatient Medications  Prior to Visit  ?Medication Sig Note  ? atorvastatin (LIPITOR) 20 MG tablet TAKE 1 TABLET BY MOUTH EVERY EVENING   ? Cholecalciferol (VITAMIN D-3) 1000 units CAPS Take 1 capsule by mouth daily.   ? imipramine (TOFRANIL) 50 MG tablet TAKE ONE TABLET BY MOUTH TWICE DAILY   ? levothyroxine (SYNTHROID) 50 MCG tablet TAKE ONE TABLET BY MOUTH EVERY DAY   ? metoprolol succinate (TOPROL-XL) 50 MG 24 hr tablet TAKE ONE TABLET BY MOUTH EVERY DAY WITH OR IMMEDIATELY FOLLOWING A MEAL   ? Multiple Vitamins-Minerals (MULTIVITAMIN ADULT PO) Take 1 tablet by mouth daily.   ? omeprazole (PRILOSEC) 20 MG capsule TAKE 1 CAPSULE BY MOUTH ONCE DAILY   ? ondansetron (ZOFRAN) 4 MG tablet Take 1 tablet (4 mg total) by mouth every 8 (eight) hours as needed for up to 10 doses for nausea or vomiting.   ? rOPINIRole (REQUIP) 0.25 MG tablet Take 0.25 mg by mouth 3 (three) times daily. 08/01/2021: Has not started   ? Semaglutide, 1 MG/DOSE, 4 MG/3ML SOPN Inject 1 mg as directed once a week.   ? sertraline (ZOLOFT) 50 MG tablet Take 1 tablet (50 mg total) by mouth daily.   ? carbidopa-levodopa (SINEMET IR) 25-100 MG tablet Take 1 tablet by mouth 3 (three) times daily. (Patient not taking: Reported on 08/01/2021)   ? Semaglutide,0.25 or 0.5MG/DOS, (OZEMPIC, 0.25 OR 0.5 MG/DOSE,) 2 MG/1.5ML SOPN Inject 0.5 mg into the skin once a week. (Patient not taking: Reported on 08/01/2021)   ? ?  No facility-administered medications prior to visit.  ? ? ?Review of Systems ? ? ?  Objective  ?  ?BP 102/70 (BP Location: Right Arm, Patient Position: Sitting, Cuff Size: Normal)   Pulse (!) 101   Temp (!) 97.5 ?F (36.4 ?C) (Oral)   Resp 14   SpO2 100%  ? ? ?Physical Exam  ?General appearance: Mildly obese female, cooperative and in no acute distress ?Head: Normocephalic, without obvious abnormality, atraumatic ?Respiratory: Respirations even and unlabored, normal respiratory rate ?Extremities: All extremities are intact.  ?Skin: Skin color, texture, turgor normal.  No rashes seen  ?Psych: Appropriate mood and affect. ?Neurologic: Mental status: Alert, oriented to person, place, and time, thought content appropriate.  Sitting in wheelchair. Stands only with assistance. Persistent resting tremor.  ? ? Assessment & Plan  ?  ? ?1. Lethargy ? ?- CBC ?- Comprehensive metabolic panel ?- Magnesium ?- Amylase ?- Sed Rate (ESR) ?- DG Abd 2 Views; Future ? ?2. Anorexia ? ?- CBC ?- Comprehensive metabolic panel ?- Magnesium ?- Amylase ?- Sed Rate (ESR) ?- DG Abd 2 Views; Future ? ?3. Resting tremor ?Progressing rapidly over the last few months, but intolerant to Sinemet prescribed by neurology, and has now stopped medication completely. Doubt serotonin syndrome since she is not taking sertraline or imipramine consistently any more.   ?   ? ?The entirety of the information documented in the History of Present Illness, Review of Systems and Physical Exam were personally obtained by me. Portions of this information were initially documented by the CMA and reviewed by me for thoroughness and accuracy.   ? ? ?Lelon Huh, MD  ?[name] ?[phone] (phone) ?[phone] (fax) ? ?[facility] ?

## 2021-08-01 NOTE — Patient Instructions (Signed)
Please review the attached list of medications and notify my office if there are any errors.  ? ?Please go to the lab draw station in [address] on the second floor of [facility]. ? ?Go to the [facility] on [facility] for abdominal Xray  ?

## 2021-08-02 ENCOUNTER — Ambulatory Visit: Payer: Self-pay

## 2021-08-02 ENCOUNTER — Encounter: Payer: Medicare Other | Admitting: Physical Therapy

## 2021-08-02 ENCOUNTER — Inpatient Hospital Stay
Admission: EM | Admit: 2021-08-02 | Discharge: 2021-08-05 | DRG: 641 | Disposition: A | Discharge status: Skilled Nursing Facility | Payer: Medicare Other | Attending: Internal Medicine | Admitting: Internal Medicine

## 2021-08-02 DIAGNOSIS — E669 Obesity, unspecified: Secondary | ICD-10-CM | POA: Diagnosis present

## 2021-08-02 DIAGNOSIS — E8729 Other acidosis: Secondary | ICD-10-CM

## 2021-08-02 DIAGNOSIS — F32A Depression, unspecified: Secondary | ICD-10-CM | POA: Diagnosis present

## 2021-08-02 DIAGNOSIS — R627 Adult failure to thrive: Secondary | ICD-10-CM | POA: Diagnosis present

## 2021-08-02 DIAGNOSIS — Z885 Allergy status to narcotic agent status: Secondary | ICD-10-CM

## 2021-08-02 DIAGNOSIS — E039 Hypothyroidism, unspecified: Secondary | ICD-10-CM | POA: Diagnosis not present

## 2021-08-02 DIAGNOSIS — E559 Vitamin D deficiency, unspecified: Secondary | ICD-10-CM | POA: Diagnosis not present

## 2021-08-02 DIAGNOSIS — E11649 Type 2 diabetes mellitus with hypoglycemia without coma: Secondary | ICD-10-CM | POA: Diagnosis present

## 2021-08-02 DIAGNOSIS — Z7985 Long-term (current) use of injectable non-insulin antidiabetic drugs: Secondary | ICD-10-CM | POA: Diagnosis not present

## 2021-08-02 DIAGNOSIS — Z9049 Acquired absence of other specified parts of digestive tract: Secondary | ICD-10-CM | POA: Diagnosis not present

## 2021-08-02 DIAGNOSIS — G20 Parkinson's disease: Secondary | ICD-10-CM | POA: Diagnosis present

## 2021-08-02 DIAGNOSIS — M1990 Unspecified osteoarthritis, unspecified site: Secondary | ICD-10-CM | POA: Diagnosis not present

## 2021-08-02 DIAGNOSIS — Z8262 Family history of osteoporosis: Secondary | ICD-10-CM

## 2021-08-02 DIAGNOSIS — E785 Hyperlipidemia, unspecified: Secondary | ICD-10-CM | POA: Diagnosis present

## 2021-08-02 DIAGNOSIS — I10 Essential (primary) hypertension: Secondary | ICD-10-CM | POA: Diagnosis not present

## 2021-08-02 DIAGNOSIS — G4733 Obstructive sleep apnea (adult) (pediatric): Secondary | ICD-10-CM | POA: Diagnosis present

## 2021-08-02 DIAGNOSIS — K219 Gastro-esophageal reflux disease without esophagitis: Secondary | ICD-10-CM | POA: Diagnosis not present

## 2021-08-02 DIAGNOSIS — E871 Hypo-osmolality and hyponatremia: Secondary | ICD-10-CM | POA: Diagnosis present

## 2021-08-02 DIAGNOSIS — E861 Hypovolemia: Secondary | ICD-10-CM | POA: Diagnosis present

## 2021-08-02 DIAGNOSIS — Z888 Allergy status to other drugs, medicaments and biological substances status: Secondary | ICD-10-CM

## 2021-08-02 DIAGNOSIS — R531 Weakness: Secondary | ICD-10-CM

## 2021-08-02 DIAGNOSIS — R338 Other retention of urine: Secondary | ICD-10-CM | POA: Clinically undetermined

## 2021-08-02 DIAGNOSIS — Z6834 Body mass index (BMI) 34.0-34.9, adult: Secondary | ICD-10-CM

## 2021-08-02 DIAGNOSIS — M47816 Spondylosis without myelopathy or radiculopathy, lumbar region: Secondary | ICD-10-CM | POA: Diagnosis not present

## 2021-08-02 DIAGNOSIS — Z79899 Other long term (current) drug therapy: Secondary | ICD-10-CM

## 2021-08-02 DIAGNOSIS — G20A1 Parkinson's disease without dyskinesia, without mention of fluctuations: Secondary | ICD-10-CM | POA: Diagnosis present

## 2021-08-02 DIAGNOSIS — Z8249 Family history of ischemic heart disease and other diseases of the circulatory system: Secondary | ICD-10-CM

## 2021-08-02 DIAGNOSIS — Z9089 Acquired absence of other organs: Secondary | ICD-10-CM | POA: Diagnosis not present

## 2021-08-02 DIAGNOSIS — Z85828 Personal history of other malignant neoplasm of skin: Secondary | ICD-10-CM | POA: Diagnosis not present

## 2021-08-02 DIAGNOSIS — E860 Dehydration: Secondary | ICD-10-CM | POA: Diagnosis not present

## 2021-08-02 DIAGNOSIS — R633 Feeding difficulties, unspecified: Secondary | ICD-10-CM | POA: Diagnosis present

## 2021-08-02 DIAGNOSIS — Z85528 Personal history of other malignant neoplasm of kidney: Secondary | ICD-10-CM

## 2021-08-02 DIAGNOSIS — R630 Anorexia: Secondary | ICD-10-CM | POA: Diagnosis present

## 2021-08-02 DIAGNOSIS — Z91048 Other nonmedicinal substance allergy status: Secondary | ICD-10-CM

## 2021-08-02 DIAGNOSIS — E872 Acidosis, unspecified: Secondary | ICD-10-CM | POA: Diagnosis present

## 2021-08-02 DIAGNOSIS — Z905 Acquired absence of kidney: Secondary | ICD-10-CM | POA: Diagnosis not present

## 2021-08-02 DIAGNOSIS — Z96641 Presence of right artificial hip joint: Secondary | ICD-10-CM | POA: Diagnosis present

## 2021-08-02 DIAGNOSIS — E876 Hypokalemia: Secondary | ICD-10-CM | POA: Diagnosis not present

## 2021-08-02 DIAGNOSIS — R5381 Other malaise: Secondary | ICD-10-CM | POA: Diagnosis present

## 2021-08-02 DIAGNOSIS — R339 Retention of urine, unspecified: Secondary | ICD-10-CM | POA: Diagnosis present

## 2021-08-02 DIAGNOSIS — F410 Panic disorder [episodic paroxysmal anxiety] without agoraphobia: Secondary | ICD-10-CM | POA: Diagnosis present

## 2021-08-02 DIAGNOSIS — Z79890 Hormone replacement therapy (postmenopausal): Secondary | ICD-10-CM

## 2021-08-02 DIAGNOSIS — E878 Other disorders of electrolyte and fluid balance, not elsewhere classified: Secondary | ICD-10-CM | POA: Diagnosis present

## 2021-08-02 DIAGNOSIS — Z9181 History of falling: Secondary | ICD-10-CM | POA: Diagnosis not present

## 2021-08-02 DIAGNOSIS — N179 Acute kidney failure, unspecified: Secondary | ICD-10-CM | POA: Diagnosis present

## 2021-08-02 DIAGNOSIS — E119 Type 2 diabetes mellitus without complications: Secondary | ICD-10-CM | POA: Diagnosis not present

## 2021-08-02 LAB — CBC WITH DIFFERENTIAL/PLATELET
Abs Immature Granulocytes: 0.13 10*3/uL — ABNORMAL HIGH (ref 0.00–0.07)
Basophils Absolute: 0.1 10*3/uL (ref 0.0–0.1)
Basophils Relative: 1 %
Eosinophils Absolute: 0 10*3/uL (ref 0.0–0.5)
Eosinophils Relative: 0 %
HCT: 47.7 % — ABNORMAL HIGH (ref 36.0–46.0)
Hemoglobin: 15.6 g/dL — ABNORMAL HIGH (ref 12.0–15.0)
Immature Granulocytes: 1 %
Lymphocytes Relative: 22 %
Lymphs Abs: 2.4 10*3/uL (ref 0.7–4.0)
MCH: 25.4 pg — ABNORMAL LOW (ref 26.0–34.0)
MCHC: 32.7 g/dL (ref 30.0–36.0)
MCV: 77.7 fL — ABNORMAL LOW (ref 80.0–100.0)
Monocytes Absolute: 1.3 10*3/uL — ABNORMAL HIGH (ref 0.1–1.0)
Monocytes Relative: 11 %
Neutro Abs: 7.3 10*3/uL (ref 1.7–7.7)
Neutrophils Relative %: 65 %
Platelets: 406 10*3/uL — ABNORMAL HIGH (ref 150–400)
RBC: 6.14 MIL/uL — ABNORMAL HIGH (ref 3.87–5.11)
RDW: 17.5 % — ABNORMAL HIGH (ref 11.5–15.5)
WBC: 11.2 10*3/uL — ABNORMAL HIGH (ref 4.0–10.5)
nRBC: 0 % (ref 0.0–0.2)

## 2021-08-02 LAB — COMPREHENSIVE METABOLIC PANEL
ALT: 16 IU/L (ref 0–32)
ALT: 21 U/L (ref 0–44)
AST: 40 IU/L (ref 0–40)
AST: 50 U/L — ABNORMAL HIGH (ref 15–41)
Albumin/Globulin Ratio: 1.6 (ref 1.2–2.2)
Albumin: 4 g/dL (ref 3.8–4.8)
Albumin: 3.9 g/dL (ref 3.5–5.0)
Alkaline Phosphatase: 109 IU/L (ref 44–121)
Alkaline Phosphatase: 93 U/L (ref 38–126)
Anion gap: 22 — ABNORMAL HIGH (ref 5–15)
BUN/Creatinine Ratio: 6 — ABNORMAL LOW (ref 12–28)
BUN: 5 mg/dL — ABNORMAL LOW (ref 8–27)
BUN: 6 mg/dL — ABNORMAL LOW (ref 8–23)
Bilirubin Total: 0.5 mg/dL (ref 0.0–1.2)
CO2: 18 mmol/L — ABNORMAL LOW (ref 20–29)
CO2: 18 mmol/L — ABNORMAL LOW (ref 22–32)
Calcium: 9.9 mg/dL (ref 8.7–10.3)
Calcium: 9.6 mg/dL (ref 8.9–10.3)
Chloride: 92 mmol/L — ABNORMAL LOW (ref 96–106)
Chloride: 93 mmol/L — ABNORMAL LOW (ref 98–111)
Creatinine, Ser: 0.84 mg/dL (ref 0.57–1.00)
Creatinine, Ser: 1.01 mg/dL — ABNORMAL HIGH (ref 0.44–1.00)
GFR, Estimated: >60 mL/min (ref >60)
Globulin, Total: 2.5 g/dL (ref 1.5–4.5)
Glucose, Bld: 82 mg/dL (ref 70–99)
Glucose: 68 mg/dL — ABNORMAL LOW (ref 70–99)
Potassium: 3.8 mmol/L (ref 3.5–5.2)
Potassium: 2.9 mmol/L — ABNORMAL LOW (ref 3.5–5.1)
Sodium: 137 mmol/L (ref 134–144)
Sodium: 133 mmol/L — ABNORMAL LOW (ref 135–145)
Total Bilirubin: 0.9 mg/dL (ref 0.3–1.2)
Total Protein: 6.5 g/dL (ref 6.0–8.5)
Total Protein: 7.9 g/dL (ref 6.5–8.1)
eGFR: 76 mL/min/{1.73_m2} (ref >59)

## 2021-08-02 LAB — BLOOD GAS, VENOUS
Acid-base deficit: 5.2 mmol/L — ABNORMAL HIGH (ref 0.0–2.0)
Bicarbonate: 20 mmol/L (ref 20.0–28.0)
O2 Saturation: 37.1 %
Patient temperature: 37
pCO2, Ven: 37 mmHg — ABNORMAL LOW (ref 44–60)
pH, Ven: 7.34 (ref 7.25–7.43)
pO2, Ven: <31 mmHg — CL (ref 32–45)

## 2021-08-02 LAB — CBC
Hematocrit: 44.7 % (ref 34.0–46.6)
Hemoglobin: 15.4 g/dL (ref 11.1–15.9)
MCH: 26.3 pg — ABNORMAL LOW (ref 26.6–33.0)
MCHC: 34.5 g/dL (ref 31.5–35.7)
MCV: 76 fL — ABNORMAL LOW (ref 79–97)
Platelets: 354 10*3/uL (ref 150–450)
RBC: 5.85 x10E6/uL — ABNORMAL HIGH (ref 3.77–5.28)
RDW: 16.8 % — ABNORMAL HIGH (ref 11.7–15.4)
WBC: 9.1 10*3/uL (ref 3.4–10.8)

## 2021-08-02 LAB — TSH: TSH: 2.168 u[IU]/mL (ref 0.350–4.500)

## 2021-08-02 LAB — MAGNESIUM
Magnesium: 1.6 mg/dL (ref 1.6–2.3)
Magnesium: 1.7 mg/dL (ref 1.7–2.4)

## 2021-08-02 LAB — PHOSPHORUS: Phosphorus: 4.2 mg/dL (ref 2.5–4.6)

## 2021-08-02 LAB — CBG MONITORING, ED: Glucose-Capillary: 66 mg/dL — ABNORMAL LOW (ref 70–99)

## 2021-08-02 LAB — ETHANOL: Alcohol, Ethyl (B): <10 mg/dL (ref <10)

## 2021-08-02 LAB — SEDIMENTATION RATE: Sed Rate: 19 mm/hr (ref 0–40)

## 2021-08-02 LAB — AMYLASE: Amylase: 21 U/L — ABNORMAL LOW (ref 31–110)

## 2021-08-02 LAB — LACTIC ACID, PLASMA: Lactic Acid, Venous: 2.2 mmol/L (ref 0.5–1.9)

## 2021-08-02 LAB — BETA-HYDROXYBUTYRIC ACID: Beta-Hydroxybutyric Acid: 5.61 mmol/L — ABNORMAL HIGH (ref 0.05–0.27)

## 2021-08-02 MED ORDER — DEXTROSE 50 % IV SOLN
1.0000 | Freq: Once | INTRAVENOUS | Status: AC
Start: 2021-08-02 — End: 2021-08-02
  Administered 2021-08-02: 50 mL via INTRAVENOUS
  Filled 2021-08-02: qty 50

## 2021-08-02 MED ORDER — POTASSIUM CHLORIDE 10 MEQ/100ML IV SOLN
10.0000 meq | INTRAVENOUS | Status: AC
  Administered 2021-08-02 – 2021-08-03 (×3): 10 meq via INTRAVENOUS
  Filled 2021-08-02 (×3): qty 100

## 2021-08-02 MED ORDER — SODIUM CHLORIDE 0.9 % IV BOLUS
1000.0000 mL | Freq: Once | INTRAVENOUS | Status: AC
Start: 2021-08-02 — End: 2021-08-03
  Administered 2021-08-02: 1000 mL via INTRAVENOUS

## 2021-08-02 MED ORDER — LACTATED RINGERS IV BOLUS
1000.0000 mL | Freq: Once | INTRAVENOUS | Status: AC
  Administered 2021-08-02: 1000 mL via INTRAVENOUS

## 2021-08-02 MED ORDER — SODIUM CHLORIDE 0.9 % IV SOLN: INTRAVENOUS | Status: DC | PRN

## 2021-08-02 NOTE — ED Triage Notes (Addendum)
Patient to ER via [facility] from home with complaints of severe dehydration. Reports she was seen on friday and received some IV fluids and was feeling better but is now feeling worse. Patient reports poor appetite and poor fluid intake. ? ?Patient was seen by her home PT today and was concerned about how pale and lethargic the patient is. Reports having labs drawn yesterday.  ?

## 2021-08-02 NOTE — ED Notes (Signed)
First Nurse Note:  Pt to ED via [facility] from home. Per EMS pts home health nurse came out today and told her that her blood pressure was 90/50 and that she needed to come to the ED to get fluids. EMS reports that pts BP was 145/74 with them and that pt has no complaints at this time. Pt has hx/o Parkinson's disease. EMS reports that pt has had significant decline in health over the last 6 weeks. EMS has been out to the residences every day to the past 10 days. Pt reports to ems that everything taste like salt so she does not take her medication or eat and drink like she is supposed to. Pt was seen here on Friday and given 2 bags of fluid per EMS. Pt is in NAD.  ?

## 2021-08-02 NOTE — Telephone Encounter (Signed)
Her labs just showed she is slightly malnourished, but otherwise normal. The abdominal xray has still not been read. If she is feeling worse today then she should go ahead and go to the ED.  ?

## 2021-08-02 NOTE — Telephone Encounter (Signed)
? ? ?  Chief Complaint: Brittany Benton PT with [facility] is with pt. And calling to report pt. And husband states pt. Is worse today. Weak, cannot get out of bed. Has not voided in over 24 hours. Has not had anything to eat or drink in 5 days. Unable to have PT. ?Symptoms: Above ?Frequency: Seen in ED 07/28/21 ?Pertinent Negatives: Patient denies  ?Disposition: '[x]'$ ED /'[]'$ Urgent Care (no appt availability in office) / '[]'$ Appointment(In office/virtual)/ '[]'$  [facility]/ '[]'$ Home Care/ '[]'$ Refused Recommended Disposition /'[]'$ [facility]/ '[]'$  Follow-up with PCP ?Additional Notes: Calling 911 for transport to ED.  ? ?Reason for Disposition ? Sounds like a life-threatening emergency to the triager ? ?Answer Assessment - Initial Assessment Questions ?1. DESCRIPTION: "Describe how you are feeling." ?    Weak, laying bed ?2. SEVERITY: "How bad is it?"  "Can you stand and walk?" ?  - MILD - Feels weak or tired, but does not interfere with work, school or normal activities ?  - MODERATE - Able to stand and walk; weakness interferes with work, school, or normal activities ?  - SEVERE - Unable to stand or walk ?    Moderate ?3. ONSET:  "When did the weakness begin?" ?    Yesterday ?4. CAUSE: "What do you think is causing the weakness?" ?    Dehydration ?5. MEDICINES: "Have you recently started a new medicine or had a change in the amount of a medicine?" ?    No ?6. OTHER SYMPTOMS: "Do you have any other symptoms?" (e.g., chest pain, fever, cough, SOB, vomiting, diarrhea, bleeding, other areas of pain) ?    Has not had eaten or had anything to drink 5 days ?7. PREGNANCY: "Is there any chance you are pregnant?" "When was your last menstrual period?" ?    No ? ?Protocols used: Weakness (Generalized) and Fatigue-A-AH ? ?

## 2021-08-02 NOTE — ED Notes (Signed)
Lactate results 2.2 read back and verified by [facility] ?

## 2021-08-02 NOTE — ED Provider Notes (Signed)
? ?[facility] ?Provider Note ? ? ? Event Date/Time  ? First MD Initiated Contact with Patient 08/02/21 2016   ?  (approximate) ? ? ?History  ? ?Weakness ? ? ?HPI ? ?Brittany Benton is a 66 y.o. female  here with generalized weakness. Pt has h/o Parkinsons, ongoing n/v/failure to thrive. She states that over the last 1 month, she's had difficulty eating/drinking, and has had weight loss, recurrent ED and PCP visits, and fatigue. Over the last week, she has been unable to eat/drink at all, and has had worsening weakness. She has had markedly dry mouth, lightheadedness, and weakness. She has gone from being ambulatory to essentially bed bound over the month. Pt believes it is related to when she tried sinemet for her Parkinson's, though has been off this for several weeks. No fever, chills. She has had decreased UOP but no urinary sx. No cough, SOB, sputum production. No abdominal pain.   ? ?  ? ? ?Physical Exam  ? ?Triage Vital Signs: ?ED Triage Vitals  ?Enc Vitals Group  ?   BP 08/02/21 1741 119/81  ?   Pulse Rate 08/02/21 1741 98  ?   Resp 08/02/21 1741 16  ?   Temp 08/02/21 1741 (!) 97.5 ?F (36.4 ?C)  ?   Temp src --   ?   SpO2 08/02/21 1741 100 %  ?   Weight --   ?   Height 08/02/21 1737 '5\' 3"'$  (1.6 m)  ?   Head Circumference --   ?   Peak Flow --   ?   Pain Score 08/02/21 1737 0  ?   Pain Loc --   ?   Pain Edu? --   ?   Excl. in [place]? --   ? ? ?Most recent vital signs: ?Vitals:  ? 08/03/21 0100 08/03/21 0130  ?BP: (!) 121/58 123/60  ?Pulse: (!) 108 (!) 110  ?Resp: (!) 22 (!) 21  ?Temp:  97.6 ?F (36.4 ?C)  ?SpO2: 95% 95%  ? ? ? ?General: Awake, no distress. ?CV:  Good peripheral perfusion. Mild, regular tachycardia w/o murmurs. ?Resp:  Normal effort. Slight tachypnea noted but lungs CTAB. ?Abd:  No distention. No tenderness. ?Other:  Markedly dry MM. Poor skin turgor. ? ? ?ED Results / Procedures / Treatments  ? ?Labs ?(all labs ordered are listed, but only abnormal results are displayed) ?Labs  Reviewed  ?CBC WITH DIFFERENTIAL/PLATELET - Abnormal; Notable for the following components:  ?    Result Value  ? WBC 11.2 (*)   ? RBC 6.14 (*)   ? Hemoglobin 15.6 (*)   ? HCT 47.7 (*)   ? MCV 77.7 (*)   ? MCH 25.4 (*)   ? RDW 17.5 (*)   ? Platelets 406 (*)   ? Monocytes Absolute 1.3 (*)   ? Abs Immature Granulocytes 0.13 (*)   ? All other components within normal limits  ?COMPREHENSIVE METABOLIC PANEL - Abnormal; Notable for the following components:  ? Sodium 133 (*)   ? Potassium 2.9 (*)   ? Chloride 93 (*)   ? CO2 18 (*)   ? BUN 6 (*)   ? Creatinine, Ser 1.01 (*)   ? AST 50 (*)   ? Anion gap 22 (*)   ? All other components within normal limits  ?LACTIC ACID, PLASMA - Abnormal; Notable for the following components:  ? Lactic Acid, Venous 2.2 (*)   ? All other components within normal limits  ?  BLOOD GAS, VENOUS - Abnormal; Notable for the following components:  ? pCO2, Ven 37 (*)   ? pO2, Ven <31 (*)   ? Acid-base deficit 5.2 (*)   ? All other components within normal limits  ?BETA-HYDROXYBUTYRIC ACID - Abnormal; Notable for the following components:  ? Beta-Hydroxybutyric Acid 5.61 (*)   ? All other components within normal limits  ?CBG MONITORING, ED - Abnormal; Notable for the following components:  ? Glucose-Capillary 66 (*)   ? All other components within normal limits  ?MAGNESIUM  ?PHOSPHORUS  ?ETHANOL  ?TSH  ?PROCALCITONIN  ?URINALYSIS, ROUTINE W REFLEX MICROSCOPIC  ?LACTIC ACID, PLASMA  ? ? ? ?EKG ?Sinus tachycardia, VR 122. QRS 78, QTc 612. Markedly prolonged QT. No St elevations. ? ? ?RADIOLOGY ?None ? ? ?I also independently reviewed and agree wit radiologist interpretations. ? ? ?PROCEDURES: ? ?Critical Care performed: Yes, see critical care procedure note(s) ? ?.Critical Care ?Performed by: Duffy Bruce, MD ?Authorized by: Duffy Bruce, MD  ? ?Critical care provider statement:  ?  Critical care time (minutes):  30 ?  Critical care time was exclusive of:  Separately billable procedures and  treating other patients ?  Critical care was necessary to treat or prevent imminent or life-threatening deterioration of the following conditions:  Cardiac failure, respiratory failure, circulatory failure and metabolic crisis ?  Critical care was time spent personally by me on the following activities:  Development of treatment plan with patient or surrogate, discussions with consultants, evaluation of patient's response to treatment, examination of patient, ordering and review of laboratory studies, ordering and review of radiographic studies, ordering and performing treatments and interventions, pulse oximetry, re-evaluation of patient's condition and review of old charts ? ? ? ?MEDICATIONS ORDERED IN ED: ?Medications  ?potassium chloride 10 mEq in 100 mL IVPB (10 mEq Intravenous New Bag/Given 08/03/21 0128)  ?0.9 %  sodium chloride infusion ( Intravenous New Bag/Given 08/02/21 2237)  ?lactated ringers bolus 1,000 mL (0 mLs Intravenous Stopped 08/02/21 2158)  ?dextrose 50 % solution 50 mL (50 mLs Intravenous Given 08/02/21 2114)  ?sodium chloride 0.9 % bolus 1,000 mL (0 mLs Intravenous Stopped 08/03/21 0125)  ? ? ? ?IMPRESSION / MDM / ASSESSMENT AND PLAN / ED COURSE  ?I reviewed the triage vital signs and the nursing notes. ?             ?               ? ? ?The patient is on the cardiac monitor to evaluate for evidence of arrhythmia and/or significant heart rate changes. ? ? ?MDM:  ?66 yo F with PMHx Parkinson's, anxiety/panic disorder, here with fatigue, weakness. Pt has had fairly complex recent history with progressive failure to thrive related to poor appetite, possible adverse effect of sinemet though it's been a significant time since last dose. On exam, pt is markedly dehydrated. Labs show hemoconcentration with elevated WBC, Hgb, Plt count. Glu 66 likely from poor PO intake. CMP shows hypokalemia, hypoantremia, acidemia all c/w dehydration. AG 22 - LA slightly elevated, BHB elevated though pt is not on  insulin/truly diabetic. She is on ozempic but this is not associated with euglycemic DKA. Suspect starvation ketosis. Will start IVF resuscitation, IV potassium replacement, and admit to medicine. IV dextrose given for her hypoglycemia. ? ?MEDICATIONS GIVEN IN ED: ?Medications  ?potassium chloride 10 mEq in 100 mL IVPB (10 mEq Intravenous New Bag/Given 08/03/21 0128)  ?0.9 %  sodium chloride infusion ( Intravenous New Bag/Given 08/02/21  2237)  ?lactated ringers bolus 1,000 mL (0 mLs Intravenous Stopped 08/02/21 2158)  ?dextrose 50 % solution 50 mL (50 mLs Intravenous Given 08/02/21 2114)  ?sodium chloride 0.9 % bolus 1,000 mL (0 mLs Intravenous Stopped 08/03/21 0125)  ? ? ? ?Consults:  ?Hospitalist consulted for admission ? ? ?EMR reviewed  ?Reviewed extensive PCP and telephone notes, Dr. Manuella Ghazi neurology notes ? ? ? ? ?FINAL CLINICAL IMPRESSION(S) / ED DIAGNOSES  ? ?Final diagnoses:  ?Dehydration  ?Hypokalemia  ?High anion gap metabolic acidosis  ?Failure to thrive in adult  ? ? ? ?Rx / DC Orders  ? ?ED Discharge Orders   ? ? None  ? ?  ? ? ? ?Note:  This document was prepared using Dragon voice recognition software and may include unintentional dictation errors. ?  ?Duffy Bruce, MD ?08/03/21 0135 ? ?

## 2021-08-03 DIAGNOSIS — G20A1 Parkinson's disease without dyskinesia, without mention of fluctuations: Secondary | ICD-10-CM | POA: Diagnosis present

## 2021-08-03 DIAGNOSIS — Z741 Need for assistance with personal care: Secondary | ICD-10-CM | POA: Diagnosis not present

## 2021-08-03 DIAGNOSIS — G20 Parkinson's disease: Secondary | ICD-10-CM | POA: Diagnosis present

## 2021-08-03 DIAGNOSIS — E785 Hyperlipidemia, unspecified: Secondary | ICD-10-CM | POA: Diagnosis present

## 2021-08-03 DIAGNOSIS — M6259 Muscle wasting and atrophy, not elsewhere classified, multiple sites: Secondary | ICD-10-CM | POA: Diagnosis not present

## 2021-08-03 DIAGNOSIS — Z743 Need for continuous supervision: Secondary | ICD-10-CM | POA: Diagnosis not present

## 2021-08-03 DIAGNOSIS — E11649 Type 2 diabetes mellitus with hypoglycemia without coma: Secondary | ICD-10-CM | POA: Diagnosis present

## 2021-08-03 DIAGNOSIS — G4733 Obstructive sleep apnea (adult) (pediatric): Secondary | ICD-10-CM | POA: Diagnosis present

## 2021-08-03 DIAGNOSIS — I10 Essential (primary) hypertension: Secondary | ICD-10-CM | POA: Diagnosis not present

## 2021-08-03 DIAGNOSIS — F410 Panic disorder [episodic paroxysmal anxiety] without agoraphobia: Secondary | ICD-10-CM | POA: Diagnosis present

## 2021-08-03 DIAGNOSIS — R338 Other retention of urine: Secondary | ICD-10-CM | POA: Diagnosis not present

## 2021-08-03 DIAGNOSIS — R2681 Unsteadiness on feet: Secondary | ICD-10-CM | POA: Diagnosis not present

## 2021-08-03 DIAGNOSIS — E872 Acidosis, unspecified: Secondary | ICD-10-CM | POA: Diagnosis not present

## 2021-08-03 DIAGNOSIS — E669 Obesity, unspecified: Secondary | ICD-10-CM | POA: Diagnosis present

## 2021-08-03 DIAGNOSIS — E876 Hypokalemia: Secondary | ICD-10-CM | POA: Diagnosis not present

## 2021-08-03 DIAGNOSIS — E039 Hypothyroidism, unspecified: Secondary | ICD-10-CM | POA: Diagnosis not present

## 2021-08-03 DIAGNOSIS — R633 Feeding difficulties, unspecified: Secondary | ICD-10-CM | POA: Diagnosis present

## 2021-08-03 DIAGNOSIS — F32A Depression, unspecified: Secondary | ICD-10-CM | POA: Diagnosis not present

## 2021-08-03 DIAGNOSIS — E871 Hypo-osmolality and hyponatremia: Secondary | ICD-10-CM | POA: Diagnosis present

## 2021-08-03 DIAGNOSIS — N179 Acute kidney failure, unspecified: Secondary | ICD-10-CM | POA: Diagnosis not present

## 2021-08-03 DIAGNOSIS — K219 Gastro-esophageal reflux disease without esophagitis: Secondary | ICD-10-CM | POA: Diagnosis present

## 2021-08-03 DIAGNOSIS — E861 Hypovolemia: Secondary | ICD-10-CM | POA: Diagnosis present

## 2021-08-03 DIAGNOSIS — R531 Weakness: Secondary | ICD-10-CM

## 2021-08-03 DIAGNOSIS — Z96641 Presence of right artificial hip joint: Secondary | ICD-10-CM | POA: Diagnosis present

## 2021-08-03 DIAGNOSIS — Z8262 Family history of osteoporosis: Secondary | ICD-10-CM | POA: Diagnosis not present

## 2021-08-03 DIAGNOSIS — R5381 Other malaise: Secondary | ICD-10-CM | POA: Diagnosis not present

## 2021-08-03 DIAGNOSIS — Z6834 Body mass index (BMI) 34.0-34.9, adult: Secondary | ICD-10-CM | POA: Diagnosis not present

## 2021-08-03 DIAGNOSIS — R630 Anorexia: Secondary | ICD-10-CM | POA: Diagnosis present

## 2021-08-03 DIAGNOSIS — Z8249 Family history of ischemic heart disease and other diseases of the circulatory system: Secondary | ICD-10-CM | POA: Diagnosis not present

## 2021-08-03 DIAGNOSIS — E860 Dehydration: Secondary | ICD-10-CM | POA: Diagnosis not present

## 2021-08-03 DIAGNOSIS — R488 Other symbolic dysfunctions: Secondary | ICD-10-CM | POA: Diagnosis not present

## 2021-08-03 DIAGNOSIS — E878 Other disorders of electrolyte and fluid balance, not elsewhere classified: Secondary | ICD-10-CM | POA: Diagnosis present

## 2021-08-03 DIAGNOSIS — R498 Other voice and resonance disorders: Secondary | ICD-10-CM | POA: Diagnosis not present

## 2021-08-03 DIAGNOSIS — R627 Adult failure to thrive: Secondary | ICD-10-CM | POA: Diagnosis present

## 2021-08-03 DIAGNOSIS — E1100 Type 2 diabetes mellitus with hyperosmolarity without nonketotic hyperglycemic-hyperosmolar coma (NKHHC): Secondary | ICD-10-CM | POA: Diagnosis not present

## 2021-08-03 DIAGNOSIS — Z7401 Bed confinement status: Secondary | ICD-10-CM | POA: Diagnosis not present

## 2021-08-03 DIAGNOSIS — R29898 Other symptoms and signs involving the musculoskeletal system: Secondary | ICD-10-CM | POA: Diagnosis not present

## 2021-08-03 LAB — CBC
HCT: 38.4 % (ref 36.0–46.0)
Hemoglobin: 12.7 g/dL (ref 12.0–15.0)
MCH: 25.3 pg — ABNORMAL LOW (ref 26.0–34.0)
MCHC: 33.1 g/dL (ref 30.0–36.0)
MCV: 76.6 fL — ABNORMAL LOW (ref 80.0–100.0)
Platelets: 226 10*3/uL (ref 150–400)
RBC: 5.01 MIL/uL (ref 3.87–5.11)
RDW: 16.4 % — ABNORMAL HIGH (ref 11.5–15.5)
WBC: 7.9 10*3/uL (ref 4.0–10.5)
nRBC: 0 % (ref 0.0–0.2)

## 2021-08-03 LAB — BASIC METABOLIC PANEL
Anion gap: 14 (ref 5–15)
BUN: <5 mg/dL — ABNORMAL LOW (ref 8–23)
CO2: 19 mmol/L — ABNORMAL LOW (ref 22–32)
Calcium: 8.3 mg/dL — ABNORMAL LOW (ref 8.9–10.3)
Chloride: 99 mmol/L (ref 98–111)
Creatinine, Ser: 0.77 mg/dL (ref 0.44–1.00)
GFR, Estimated: >60 mL/min (ref >60)
Glucose, Bld: 69 mg/dL — ABNORMAL LOW (ref 70–99)
Potassium: 3.6 mmol/L (ref 3.5–5.1)
Sodium: 132 mmol/L — ABNORMAL LOW (ref 135–145)

## 2021-08-03 LAB — PROCALCITONIN: Procalcitonin: <0.1 ng/mL

## 2021-08-03 LAB — HIV ANTIBODY (ROUTINE TESTING W REFLEX): HIV Screen 4th Generation wRfx: NONREACTIVE

## 2021-08-03 LAB — LACTIC ACID, PLASMA: Lactic Acid, Venous: 1 mmol/L (ref 0.5–1.9)

## 2021-08-03 MED ORDER — PANTOPRAZOLE SODIUM 40 MG PO TBEC
40.0000 mg | DELAYED_RELEASE_TABLET | Freq: Every day | ORAL | Status: DC
Start: 2021-08-03 — End: 2021-08-05
  Administered 2021-08-04 – 2021-08-05 (×2): 40 mg via ORAL
  Filled 2021-08-03 (×3): qty 1

## 2021-08-03 MED ORDER — ACETAMINOPHEN 650 MG RE SUPP: 650.0000 mg | Freq: Four times a day (QID) | RECTAL | Status: DC | PRN

## 2021-08-03 MED ORDER — ROPINIROLE HCL 0.25 MG PO TABS
0.2500 mg | ORAL_TABLET | Freq: Three times a day (TID) | ORAL | Status: DC
Start: 2021-08-03 — End: 2021-08-04
  Administered 2021-08-03: 0.25 mg via ORAL
  Filled 2021-08-03 (×7): qty 1

## 2021-08-03 MED ORDER — ACETAMINOPHEN 325 MG PO TABS: 650.0000 mg | ORAL_TABLET | Freq: Four times a day (QID) | ORAL | Status: DC | PRN

## 2021-08-03 MED ORDER — TRAZODONE HCL 50 MG PO TABS: 25.0000 mg | ORAL_TABLET | Freq: Every evening | ORAL | Status: DC | PRN

## 2021-08-03 MED ORDER — SERTRALINE HCL 50 MG PO TABS
50.0000 mg | ORAL_TABLET | Freq: Every day | ORAL | Status: DC
  Administered 2021-08-04 – 2021-08-05 (×2): 50 mg via ORAL
  Filled 2021-08-03 (×3): qty 1

## 2021-08-03 MED ORDER — MAGNESIUM SULFATE 2 GM/50ML IV SOLN
2.0000 g | Freq: Once | INTRAVENOUS | Status: AC
  Administered 2021-08-03: 2 g via INTRAVENOUS
  Filled 2021-08-03: qty 50

## 2021-08-03 MED ORDER — ONDANSETRON HCL 4 MG PO TABS: 4.0000 mg | ORAL_TABLET | Freq: Three times a day (TID) | ORAL | Status: DC | PRN

## 2021-08-03 MED ORDER — SEMAGLUTIDE (1 MG/DOSE) 4 MG/3ML ~~LOC~~ SOPN: 1.0000 mg | PEN_INJECTOR | SUBCUTANEOUS | Status: DC

## 2021-08-03 MED ORDER — ENOXAPARIN SODIUM 60 MG/0.6ML IJ SOSY
0.5000 mg/kg | PREFILLED_SYRINGE | INTRAMUSCULAR | Status: DC
  Administered 2021-08-03 – 2021-08-05 (×3): 45 mg via SUBCUTANEOUS
  Filled 2021-08-03 (×4): qty 0.6

## 2021-08-03 MED ORDER — ATORVASTATIN CALCIUM 20 MG PO TABS
20.0000 mg | ORAL_TABLET | Freq: Every evening | ORAL | Status: DC
  Administered 2021-08-03 – 2021-08-04 (×2): 20 mg via ORAL
  Filled 2021-08-03 (×3): qty 1

## 2021-08-03 MED ORDER — LEVOTHYROXINE SODIUM 50 MCG PO TABS
50.0000 ug | ORAL_TABLET | Freq: Every day | ORAL | Status: DC
  Administered 2021-08-04 – 2021-08-05 (×2): 50 ug via ORAL
  Filled 2021-08-03 (×3): qty 1

## 2021-08-03 MED ORDER — VITAMIN D 25 MCG (1000 UNIT) PO TABS
1000.0000 [IU] | ORAL_TABLET | Freq: Every day | ORAL | Status: DC
  Administered 2021-08-04 – 2021-08-05 (×2): 1000 [IU] via ORAL
  Filled 2021-08-03 (×3): qty 1

## 2021-08-03 MED ORDER — ONDANSETRON HCL 4 MG/2ML IJ SOLN: 4.0000 mg | Freq: Four times a day (QID) | INTRAMUSCULAR | Status: DC | PRN

## 2021-08-03 MED ORDER — METOPROLOL SUCCINATE ER 50 MG PO TB24
50.0000 mg | ORAL_TABLET | Freq: Every day | ORAL | Status: DC
  Administered 2021-08-04 – 2021-08-05 (×2): 50 mg via ORAL
  Filled 2021-08-03 (×3): qty 1

## 2021-08-03 MED ORDER — MAGNESIUM HYDROXIDE 400 MG/5ML PO SUSP: 30.0000 mL | Freq: Every day | ORAL | Status: DC | PRN

## 2021-08-03 MED ORDER — POTASSIUM CHLORIDE 20 MEQ PO PACK
40.0000 meq | PACK | Freq: Once | ORAL | Status: DC
  Filled 2021-08-03: qty 2

## 2021-08-03 MED ORDER — ONDANSETRON HCL 4 MG PO TABS: 4.0000 mg | ORAL_TABLET | Freq: Four times a day (QID) | ORAL | Status: DC | PRN

## 2021-08-03 MED ORDER — IMIPRAMINE HCL 50 MG PO TABS
50.0000 mg | ORAL_TABLET | Freq: Two times a day (BID) | ORAL | Status: DC
  Administered 2021-08-03 – 2021-08-05 (×4): 50 mg via ORAL
  Filled 2021-08-03 (×5): qty 1

## 2021-08-03 MED ORDER — ADULT MULTIVITAMIN W/MINERALS CH
ORAL_TABLET | Freq: Every day | ORAL | Status: DC
  Administered 2021-08-04: 1 via ORAL
  Filled 2021-08-03 (×2): qty 1

## 2021-08-03 MED ORDER — POTASSIUM CHLORIDE IN NACL 20-0.9 MEQ/L-% IV SOLN
INTRAVENOUS | Status: DC
  Filled 2021-08-03 (×8): qty 1000

## 2021-08-03 NOTE — Evaluation (Signed)
Physical Therapy Evaluation ?Patient Details ?Name: Brittany Benton ?MRN: [id number] ?DOB: 12-09-1955 ?Today's Date: 08/03/2021 ? ?History of Present Illness ? Patient is a 66 year old female who presented to [facility] with  acute onset of significant anorexia over the last 5 weeks with generalized weakness. Patient has a PMH (+) for PD, anixety, GERD, and sleep apnea. ?  ?Clinical Impression ? Physical Therapy Evaluation completed this date. Patient tolerated session well, was agreeable to treatment, and reported no pain. Patient is very fearful of falling, due to hx of falls. Reports she has had about 15 falls in the last 6 months. Prior to hospitalization reports being Mod I with her RW at home and Mod I with ADLs up until about a week ago when she was not longer able to walk and felt very weak. Patient was receiving HHPT upon admission. Per patient's husband, patient lives in a multi-story home with 8+ steps to enter and bilateral to unilateral hand rails. Husband works part time, and has been assisting with ADLs for the past week.  ? ?Patient demonstrated decreased strength in BUE and BLEs with a RUE tremor noted. RLE demonstrated greater weakness than LLE, at 2-/5 compared to LLE at 2+to3/5. Sensation intact bilaterally. Patient is able to slide BLEs to EOB, however required Mod-Max A with all bed mobility due to weakness. Sitting EOB patient is able to remain upright with a slight posterior lean and BUE support. With max encouragement due to fear, patient agreed to attempt standing. Patient required Mod A +2 physical assist and for safety. RW was placed anterior, and bilateral knees were blocked to prevent knee buckling. Patient only tolerated a short period of time in standing due to fear. Patient was left in bed with all need met and in reach with family present. Patient would continue to benefit from skilled physical therapy in order to optimize patient's return to PLOF. Recommend STR upon discharge from acute  hospitalization.  ?   ? ?Recommendations for follow up therapy are one component of a multi-disciplinary discharge planning process, led by the attending physician.  Recommendations may be updated based on patient status, additional functional criteria and insurance authorization. ? ?Follow Up Recommendations Skilled nursing-short term rehab (<3 hours/day) ? ?  ?Assistance Recommended at Discharge Frequent or constant Supervision/Assistance  ?Patient can return home with the following ? A lot of help with bathing/dressing/bathroom;A lot of help with walking and/or transfers;Assist for transportation;Help with stairs or ramp for entrance ? ?  ?Equipment Recommendations Other (comment) (TBD to next level of care)  ?Recommendations for Other Services ?    ?  ?Functional Status Assessment Patient has had a recent decline in their functional status and demonstrates the ability to make significant improvements in function in a reasonable and predictable amount of time.  ? ?  ?Precautions / Restrictions Precautions ?Precautions: Fall ?Restrictions ?Weight Bearing Restrictions: No  ? ?  ? ?Mobility ? Bed Mobility ?Overal bed mobility: Needs Assistance ?Bed Mobility: Supine to Sit, Sit to Supine ?  ?  ?Supine to sit: Mod assist, Max assist (Patient was able to move BLEs off the edge of the bed, however required increased assistance at the trunk and to anterior scoot to EOB to complete transfer) ?Sit to supine: Max assist ?  ?  ?Patient Response: Anxious, Cooperative ? ?Transfers ?Overall transfer level: Needs assistance ?Equipment used: Rolling walker (2 wheels) ?Transfers: Sit to/from Stand ?Sit to Stand: Mod assist, +2 physical assistance, +2 safety/equipment ?  ?  ?  ?  ?  ?  General transfer comment: B knees blocked to prevent knee buckling with RW placed anteriorly, once in standing Mod A to remain standing, patient very fearful and requested to sit after ~15 seconds ?  ? ?Ambulation/Gait ?  ?  ?  ?  ?  ?  ?  ?General  Gait Details: deferred for safety ? ?Stairs ?  ?  ?  ?  ?  ? ?Wheelchair Mobility ?  ? ?Modified Rankin (Stroke Patients Only) ?  ? ?  ? ?Balance Overall balance assessment: Needs assistance ?Sitting-balance support: Bilateral upper extremity supported, Feet supported ?Sitting balance-Leahy Scale: Fair ?Sitting balance - Comments: Patient able to reach outside BOS, slight posterior lean ?  ?Standing balance support: Bilateral upper extremity supported, During functional activity, Reliant on assistive device for balance ?Standing balance-Leahy Scale: Poor ?Standing balance comment: Required Mod A to maintain in standing ?  ?  ?  ?  ?  ?  ?  ?  ?  ?  ?  ?   ? ? ? ?Pertinent Vitals/Pain Pain Assessment ?Pain Assessment: No/denies pain ?Pain Intervention(s): Monitored during session  ? ? ?Home Living Family/patient expects to be discharged to:: Private residence ?Living Arrangements: Spouse/significant other ?Available Help at Discharge: Family;Available PRN/intermittently (Husband works part time- 3days/wk) ?Type of Home: House ?Home Access: Stairs to enter ?Entrance Stairs-Rails: Right ?Entrance Stairs-Number of Steps: 4 steps to enter with B handrails, with additional 4 more steps with R handrails ?  ?Home Layout: Multi-level ?Home Equipment: Rollator (4 wheels);Wheelchair - manual ?   ?  ?Prior Function Prior Level of Function : Needs assist ?  ?  ?  ?  ?  ?  ?Mobility Comments: Ambulated with a RW up unatil 5-6 days ago, has notbeen ambulating since ?ADLs Comments: Husband assists with bathing, dressing, etc ?  ? ? ?Hand Dominance  ? Dominant Hand: Right ? ?  ?Extremity/Trunk Assessment  ? Upper Extremity Assessment ?Upper Extremity Assessment: Generalized weakness (at least 3/5 bilaterally) ?  ? ?Lower Extremity Assessment ?Lower Extremity Assessment: Generalized weakness;RLE deficits/detail;LLE deficits/detail ?RLE Deficits / Details: 2-/5 hip flexor strength, decreased ROM ?RLE Sensation: WNL ?LLE Deficits /  Details: at least 2+to3/5, decreased ROM ?LLE Sensation: WNL ?  ? ?   ?Communication  ? Communication: No difficulties  ?Cognition Arousal/Alertness: Awake/alert ?Behavior During Therapy: [facility] for tasks assessed/performed ?Overall Cognitive Status: Within Functional Limits for tasks assessed ?  ?  ?  ?  ?  ?  ?  ?  ?  ?  ?  ?  ?  ?  ?  ?  ?General Comments: A&Ox3 self location and situation ?  ?  ? ?  ?General Comments General comments (skin integrity, edema, etc.): HR ranged from 95-104bpm, SpO2 reamined >90% on room air ? ?  ?Exercises Other Exercises ?Other Exercises: Patient educated on role of PT in acute care setting, fall risk, and d/c recommendations  ? ?Assessment/Plan  ?  ?PT Assessment Patient needs continued PT services  ?PT Problem List Decreased balance;Decreased strength;Decreased activity tolerance;Decreased range of motion;Decreased mobility;Decreased knowledge of use of DME;Decreased safety awareness;Decreased skin integrity ? ?   ?  ?PT Treatment Interventions DME instruction;Functional mobility training;Balance training;Gait training;Therapeutic activities;Therapeutic exercise;Stair training   ? ?PT Goals (Current goals can be found in the Care Plan section)  ?Acute Rehab PT Goals ?Patient Stated Goal: to get better ?PT Goal Formulation: With patient ?Time For Goal Achievement: 08/17/21 ?Potential to Achieve Goals: Fair ? ?  ?Frequency Min 2X/week ?  ? ? ?  Co-evaluation   ?  ?  ?  ?  ? ? ?  ?AM-PAC PT "6 Clicks" Mobility  ?Outcome Measure Help needed turning from your back to your side while in a flat bed without using bedrails?: A Lot ?Help needed moving from lying on your back to sitting on the side of a flat bed without using bedrails?: A Lot ?Help needed moving to and from a bed to a chair (including a wheelchair)?: A Lot ?Help needed standing up from a chair using your arms (e.g., wheelchair or bedside chair)?: A Lot ?Help needed to walk in hospital room?: Total ?Help needed climbing 3-5 steps  with a railing? : Total ?6 Click Score: 10 ? ?  ?End of Session Equipment Utilized During Treatment: Gait belt ?Activity Tolerance: Patient tolerated treatment well;Other (comment) (limited by fear) ?Patient le

## 2021-08-03 NOTE — Assessment & Plan Note (Addendum)
Secondary to intravascular volume depletion.  Patient does not have sepsis.  Procalcitonin level normal and no evidence of infection.  Sepsis has been ruled out.  Mild leukocytosis on admission was reactive and without antibiotics, normalized following hydration.  Lactic acid at 2.2 on admission, down to 1.0 following hydration. ?

## 2021-08-03 NOTE — Consult Note (Signed)
[facility] Face-to-Face Psychiatry Consult  ? ?Reason for Consult:  depression/poor appetite ?Referring Physician:  Mansy ?Patient Identification: Brittany Benton ?MRN:  [id number] ?Principal Diagnosis: Dehydration ?Diagnosis:  Principal Problem: ?  Dehydration ?Active Problems: ?  Essential (primary) hypertension ?  Obstructive sleep apnea ?  Physical deconditioning ?  Obesity (BMI 30-39.9) ?  Hypothyroidism ?  Parkinson's disease ([place]) ?  Dyslipidemia ?  GERD without esophagitis ?  Hypokalemia ?  Hyponatremia ?  AKI (acute kidney injury) ([place]) ?  Lactic acidosis ?  Weakness ?  Acute urinary retention ? ? ?Total Time spent with patient: 1.5 hours ? ?Subjective: "I'm happy with my life, I love it."    ?Brittany Benton is a 66 y.o. female patient admitted with generalized weakness. ? ?HPI:  Patient was approached as she was in her room, sitting up in bed, looking out window. She was calm and pleasant on approach. She states that she came into the hospital because she was feeling weak and wasn't able to stand. She says her diagnosis of Parkinson's came in March 2023 and  she is still learning about it.  ?Patient was very open about her relationship with her husband, stating that she lives a life together but they are very different.  She states "I am not depressed, but my husband thinks I am."  She states that she enjoys being at home most of the time, not needing to go out every day after retiring about 6 years ago.  Patient worked at [employer]  for 40 years, in a very stressful position with corporate account.  Both she and her husband used to work there and that is where they met.  They have been married for 37 years she states that her husband is opposite and has to go out and do something every day.  Patient reports when he retired he had a Forensic scientist.  She states she did not even tell anybody he was retiring.  She tells me this to help me understand she is not at all depressed just because she does not want to go out and  do all the things that he does.  She states she likes to be home and work around her house, look out her windows.  She enjoys this.  She feels that her husband puts other things in people as a priority over her sometimes, but states "I have learned to live with it."  He states that in general they really do enjoy life together.  She states he has been on more than 20 cruises, will go to various medication places.  They have been grown son, who is going to have open heart surgery in about a week and she states that is a bit of a stressor.  They have a 1 year old grandson, whom they both adore.  Patient states that she really has not mourned the loss of her life as she knew it before Parkinson's.  She starts to cry and then thanks writer for giving her permission to just "feel her own feelings, " stating that this is the fist time she has allowed herself to grieve her old life.  Therapeutic presence and listening was offered to patient, who expressed much appreciation.  Patient expresses that she felt bad that family had arranged for her to go to rehab facility without leaving talking to her first.  She states that she understands this is necessary and will eventually be good for her, but wishes she had been spoken to  about it prior than making the decision.  Patient feels she has a lot of support with her sisters and nephew.  She states that she has a "great network of people to lean on." We discussed that it is very normal to grieve the loss of some of her mobility and things she used to do prior to Parkinson's.  However, she does express hope that she will learn to live fully with any new limitations.   ? ?Patient denies any suicidal thoughts or intent.  She states that she started having some issues with anxiety and depression when she was in her 12s, but has been on medication since and she feels that this is remained adequate in controlling her symptoms.  Patient stated that she did go to a therapist at one  time.  Writer encouraged patient that this would be likely very beneficial to her, and possibly for her husband in marital counseling, if they are willing.  Patient states that she has never had any suicidal thoughts; has never contemplated killing herself or even not wanting to be alive.   Patient identifies her faith and her family as protective factors.  ?Writer acknowledged that difficulty wanting to eat may be related to her anxiety.  Encouraged patient to try to eat some to keep her strength up.  Patient stated that she would agree to try.  Patient denies any thoughts of harming anyone else.  Patient denies auditory or visual hallucinations. ? ?Does not require inpatient psychiatric hospitalization.  Medication adjustments to her sertraline and imipramine may be discussed when more medically stable. ? ?Past Psychiatric History: Anxiety and depression ? ?Risk to Self:   ?Risk to Others:   ?Prior Inpatient Therapy:   ?Prior Outpatient Therapy:   ? ?Past Medical History:  ?Past Medical History:  ?Diagnosis Date  ? Anxiety   ? Arthritis   ? osteoarthritis -right hip  ? 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 GERD (gastroesophageal reflux disease)   ? Hernia, incisional   ? after renal surgery  ? History of chicken pox   ? Hyperlipidemia   ? Renal cell carcinoma ([place])   ? s/p right nephrectomy 1994  ? Sleep apnea   ?  ?Past Surgical History:  ?Procedure Laterality Date  ? APPENDECTOMY  1994  ? BREAST BIOPSY Right 1991  ? Negative  ? CHOLECYSTECTOMY  1994  ? LAPAROSCOPIC GASTRIC SLEEVE RESECTION  02/02/2016  ? Dr Duke Salvia at [place]  ? NEPHRECTOMY  1994  ? Renal Cell Carcinoma  ? parotid gland removal  1990  ? also removed a tumor  ? TONSILLECTOMY    ? TOTAL HIP ARTHROPLASTY Right 08/08/2016  ? Procedure: TOTAL HIP ARTHROPLASTY;  Surgeon: Earnestine Leys, MD;  Location: [facility] ORS;  Service: Orthopedics;  Laterality: Right;  ? ?Family History:  ?Family History  ?Problem Relation Age of Onset  ? Osteoporosis Mother    ? Dementia Mother   ? Heart disease Father   ? Heart attack Father   ? Breast cancer Maternal Aunt   ? ?Family Psychiatric  History: none reported ?Social History:  ?Social History  ? ?Substance and Sexual Activity  ?Alcohol Use Not Currently  ? Alcohol/week: 0.0 standard drinks  ?   ?Social History  ? ?Substance and Sexual Activity  ?Drug Use No  ?  ?Social History  ? ?Socioeconomic History  ? Marital status: Married  ?  Spouse name: Not on file  ? Number of children: 1  ? Years of education: 51  ?  Highest education level: High school graduate  ?Occupational History  ? Occupation: Retired  ?  Comment: 2016  ?Tobacco Use  ? Smoking status: Never  ? Smokeless tobacco: Never  ?Substance and Sexual Activity  ? Alcohol use: Not Currently  ?  Alcohol/week: 0.0 standard drinks  ? Drug use: No  ? Sexual activity: Not on file  ?Other Topics Concern  ? Not on file  ?Social History Narrative  ? Not on file  ? ?Social Determinants of Health  ? ?Financial Resource Strain: Low Risk   ? Difficulty of Paying Living Expenses: Not hard at all  ?Food Insecurity: No Food Insecurity  ? Worried About Charity fundraiser in the Last Year: Never true  ? Ran Out of Food in the Last Year: Never true  ?Transportation Needs: No Transportation Needs  ? Lack of Transportation (Medical): No  ? Lack of Transportation (Non-Medical): No  ?Physical Activity: Inactive  ? Days of Exercise per Week: 0 days  ? Minutes of Exercise per Session: 0 min  ?Stress: No Stress Concern Present  ? Feeling of Stress : Only a little  ?Social Connections: Moderately Isolated  ? Frequency of Communication with Friends and Family: More than three times a week  ? Frequency of Social Gatherings with Friends and Family: More than three times a week  ? Attends Religious Services: Never  ? Active Member of Clubs or Organizations: No  ? Attends Archivist Meetings: Never  ? Marital Status: Married  ? ?Additional Social History: ?  ? ?Allergies:   ?Allergies   ?Allergen Reactions  ? Codeine Nausea And Vomiting  ? Morphine Hives and Swelling  ? Tape Other (See Comments)  ?  blisters ?blisters  ? Tramadol Other (See Comments)  ?  Hallucinations  ? ? ?Labs:  ?Result

## 2021-08-03 NOTE — Assessment & Plan Note (Signed)
>>  ASSESSMENT AND PLAN FOR DYSLIPIDEMIA WRITTEN ON 08/03/2021  6:30 AM BY MANSY, JAN A, MD  - We will continue statin therapy.

## 2021-08-03 NOTE — Assessment & Plan Note (Signed)
-   We will continue PPI therapy 

## 2021-08-03 NOTE — Assessment & Plan Note (Signed)
-   We will continue her Toprol-XL. 

## 2021-08-03 NOTE — Hospital Course (Addendum)
66 year old female with past medical history of Parkinson's disease and obesity who presented to the emergency room on 4/19 with generalized weakness.  Patient related that for the past month, she has had difficulty eating and drinking with worsening weight loss and has gone from being ambulatory to essentially bedbound over the past.  Patient has been off of her Sinemet for Parkinson's for several weeks.  Patient look to be clinically dehydrated and was started on IV fluids.  She was brought in for further evaluation. ? ?Following hospitalization, patient found to be very deconditioned and recommendation is for skilled nursing.  In addition, patient has been having issues with urinary retention.  There were concerns about her nutritional intake.  She was seen by speech and much of her swallowing issues appear to be more functional in terms of mindset rather than an actual swallowing issue.  Nutrition is following. ?

## 2021-08-03 NOTE — Assessment & Plan Note (Addendum)
Patient had intermittent episodes of this during hospitalization, requiring Foley catheter at 1 point.  Suspect it was secondary to electrolyte abnormalities although from medications, imipramine known to cause this.  I am reluctant to discontinue this medicine altogether.  She normally is on 50 mg twice a day for anxiety.  I am going to change this medication to 25 mg twice daily and this hopefully will help her urinary retention.  She does not have complete urinary retention and is able to void some. ?

## 2021-08-03 NOTE — H&P (Addendum)
?  ?  ?[place] ? ? ?PATIENT NAME: Brittany Benton   ? ?MR#:  [id number] ? ?DATE OF BIRTH:  1956-02-03 ? ?DATE OF ADMISSION:  08/02/2021 ? ?PRIMARY CARE PHYSICIAN: Birdie Sons, MD  ? ?Patient is coming from: Home ? ?REQUESTING/REFERRING PHYSICIAN: Duffy Bruce, MD ? ?CHIEF COMPLAINT:  ? ?Chief Complaint  ?Patient presents with  ?? Weakness  ? ? ?HISTORY OF PRESENT ILLNESS:  ?Brittany Benton is a 66 y.o. Caucasian female with medical history significant for chronic medical problems that are mentioned below, who presented to the emergency room with acute onset of significant anorexia over the last 5 weeks with generalized weakness.  She denies any nausea or vomiting or abdominal pain.  No dysphagia.  She stated that all types of foods taste like salt.  She believes that this may be related to her recently started Sinemet for Parkinson's though this was about a week ago.  No dysuria, oliguria or hematuria or flank pain.  She admitted to be severely depressed and admitted to anxiety.  She is not suicidal. ? ?ED Course: When she came to the ER, heart rate was 102 with otherwise normal vital signs..  Labs revealed blood gas with pH 7.34 and HCO3 20.  CBC showed hypokalemia and hyponatremia and hypochloremia and a CO2 of 18 with anion gap of 22 and AST of 50.  Lactic acid was 2.2 and later 1 and procalcitonin less than 0.1.  CBC showed no leukocytosis of 11.2 and hemoconcentration as well as thrombocytosis.  Beta hydroxybutyrate was 5.61 and TSH 2.16.  Alcohol levels less than 10.  Sat of ?EKG as reviewed by me : EKG showed accelerated junctional rhythm with poor R wave progression and T wave inversion laterally  with Q waves inferiorly ? ?The patient was given an amp of D50 as well as 2 L bolus of IV lactated Ringer followed by normal saline at 50 mill per hour and 10 mcg IV potassium chloride.  She will be admitted to AN observation medical bed for further evaluation and management ?PAST MEDICAL HISTORY:  ? ?Past  Medical History:  ?Diagnosis Date  ?? Anxiety   ?? Arthritis   ? osteoarthritis -right hip  ?? 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 GERD (gastroesophageal reflux disease)   ?? Hernia, incisional   ? after renal surgery  ?? History of chicken pox   ?? Hyperlipidemia   ?? Renal cell carcinoma ([place])   ? s/p right nephrectomy 1994  ?? Sleep apnea   ? ? ?PAST SURGICAL HISTORY:  ? ?Past Surgical History:  ?Procedure Laterality Date  ?? APPENDECTOMY  1994  ?? BREAST BIOPSY Right 1991  ? Negative  ?? CHOLECYSTECTOMY  1994  ?? LAPAROSCOPIC GASTRIC SLEEVE RESECTION  02/02/2016  ? Dr Duke Salvia at [place]  ?? NEPHRECTOMY  1994  ? Renal Cell Carcinoma  ?? parotid gland removal  1990  ? also removed a tumor  ?? TONSILLECTOMY    ?? TOTAL HIP ARTHROPLASTY Right 08/08/2016  ? Procedure: TOTAL HIP ARTHROPLASTY;  Surgeon: Earnestine Leys, MD;  Location: [facility] ORS;  Service: Orthopedics;  Laterality: Right;  ? ? ?SOCIAL HISTORY:  ? ?Social History  ? ?Tobacco Use  ?? Smoking status: Never  ?? Smokeless tobacco: Never  ?Substance Use Topics  ?? Alcohol use: Not Currently  ?  Alcohol/week: 0.0 standard drinks  ? ? ?FAMILY HISTORY:  ? ?Family History  ?Problem Relation Age of Onset  ?? Osteoporosis Mother   ?? Dementia  Mother   ?? Heart disease Father   ?? Heart attack Father   ?? Breast cancer Maternal Aunt   ? ? ?DRUG ALLERGIES:  ? ?Allergies  ?Allergen Reactions  ?? Codeine Nausea And Vomiting  ?? Morphine Hives and Swelling  ?? Tape Other (See Comments)  ?  blisters ?blisters  ?? Tramadol Other (See Comments)  ?  Hallucinations  ? ? ?REVIEW OF SYSTEMS:  ? ?ROS ?As per history of present illness. All pertinent systems were reviewed above. Constitutional, HEENT, cardiovascular, respiratory, GI, GU, musculoskeletal, neuro, psychiatric, endocrine, integumentary and hematologic systems were reviewed and are otherwise negative/unremarkable except for positive findings mentioned above in the HPI. ? ? ?MEDICATIONS AT HOME:  ? ?Prior to  Admission medications   ?Medication Sig Start Date End Date Taking? Authorizing Provider  ?atorvastatin (LIPITOR) 20 MG tablet TAKE 1 TABLET BY MOUTH EVERY EVENING 07/26/21   Birdie Sons, MD  ?carbidopa-levodopa (SINEMET IR) 25-100 MG tablet Take 1 tablet by mouth 3 (three) times daily. ?Patient not taking: Reported on 08/01/2021    [provider]  ?Cholecalciferol (VITAMIN D-3) 1000 units CAPS Take 1 capsule by mouth daily.    [provider]  ?imipramine (TOFRANIL) 50 MG tablet TAKE ONE TABLET BY MOUTH TWICE DAILY 02/28/21   Birdie Sons, MD  ?levothyroxine (SYNTHROID) 50 MCG tablet TAKE ONE TABLET BY MOUTH EVERY DAY 07/26/21   Birdie Sons, MD  ?metoprolol succinate (TOPROL-XL) 50 MG 24 hr tablet TAKE ONE TABLET BY MOUTH EVERY DAY WITH OR IMMEDIATELY FOLLOWING A MEAL 07/26/21   Birdie Sons, MD  ?Multiple Vitamins-Minerals (MULTIVITAMIN ADULT PO) Take 1 tablet by mouth daily. 07/16/07   [provider]  ?omeprazole (PRILOSEC) 20 MG capsule TAKE 1 CAPSULE BY MOUTH ONCE DAILY 07/26/21   Birdie Sons, MD  ?ondansetron (ZOFRAN) 4 MG tablet Take 1 tablet (4 mg total) by mouth every 8 (eight) hours as needed for up to 10 doses for nausea or vomiting. 07/28/21   Lucrezia Starch, MD  ?rOPINIRole (REQUIP) 0.25 MG tablet Take 0.25 mg by mouth 3 (three) times daily. 07/27/21   [provider]  ?Semaglutide, 1 MG/DOSE, 4 MG/3ML SOPN Inject 1 mg as directed once a week. 02/22/21   Birdie Sons, MD  ?Semaglutide,0.25 or 0.'5MG'$ /DOS, (OZEMPIC, 0.25 OR 0.5 MG/DOSE,) 2 MG/1.5ML SOPN Inject 0.5 mg into the skin once a week. ?Patient not taking: Reported on 08/01/2021 07/19/21   Birdie Sons, MD  ?sertraline (ZOLOFT) 50 MG tablet Take 1 tablet (50 mg total) by mouth daily. 07/26/21   Birdie Sons, MD  ? ?  ? ?VITAL SIGNS:  ?Blood pressure 132/60, pulse (!) 102, temperature 98.9 ?F (37.2 ?C), resp. rate 20, height '5\' 3"'$  (1.6 m), SpO2 100 %. ? ?PHYSICAL EXAMINATION:  ?Physical  Exam ? ?GENERAL:  66 y.o.-year-old Caucasian female patient lying in the bed with no acute distress.  ?EYES: Pupils equal, round, reactive to light and accommodation. No scleral icterus. Extraocular muscles intact.  ?HEENT: Head atraumatic, normocephalic. Oropharynx with dry mucous membrane and tongue and nasopharynx clear.  ?NECK:  Supple, no jugular venous distention. No thyroid enlargement, no tenderness.  ?LUNGS: Normal breath sounds bilaterally, no wheezing, rales,rhonchi or crepitation. No use of accessory muscles of respiration.  ?CARDIOVASCULAR: Regular rate and rhythm, S1, S2 normal. No murmurs, rubs, or gallops.  ?ABDOMEN: Soft, nondistended, nontender. Bowel sounds present. No organomegaly or mass.  ?EXTREMITIES: No pedal edema, cyanosis, or clubbing.  ?NEUROLOGIC: Cranial nerves  II through XII are intact. Muscle strength 5/5 in all extremities. Sensation intact. Gait not checked.  ?PSYCHIATRIC: The patient is alert and oriented x 3.  Normal affect and good eye contact. ?SKIN: No obvious rash, lesion, or ulcer.  ? ?LABORATORY PANEL:  ? ?CBC ?Recent Labs  ?Lab 08/03/21 ?0230  ?WBC 7.9  ?HGB 12.7  ?HCT 38.4  ?PLT 226  ? ?------------------------------------------------------------------------------------------------------------------ ? ?Chemistries  ?Recent Labs  ?Lab 08/02/21 ?2035 08/03/21 ?0230  ?NA 133* 132*  ?K 2.9* 3.6  ?CL 93* 99  ?CO2 18* 19*  ?GLUCOSE 82 69*  ?BUN 6* <5*  ?CREATININE 1.01* 0.77  ?CALCIUM 9.6 8.3*  ?MG 1.7  --   ?AST 50*  --   ?ALT 21  --   ?ALKPHOS 93  --   ?BILITOT 0.9  --   ? ?------------------------------------------------------------------------------------------------------------------ ? ?Cardiac Enzymes ?No results for input(s): TROPONINI in the last 168 hours. ?------------------------------------------------------------------------------------------------------------------ ? ?RADIOLOGY:  ?No results found. ? ? ? ?IMPRESSION AND PLAN:  ?Assessment and Plan: ?*  Dehydration ?- This is secondary to severe anorexia of questionable etiology with subsequent high anion gap metabolic acidosis. ?- The patient will be admitted to a medical monitor observation bed. ?- She will be hydra

## 2021-08-03 NOTE — Assessment & Plan Note (Signed)
-   We will continue statin therapy. 

## 2021-08-03 NOTE — TOC Progression Note (Signed)
Transition of Care (TOC) - Progression Note  ? ? ?Patient Details  ?Name: Brittany Benton ?MRN: [id number] ?Date of Birth: 24-Oct-1955 ? ?Transition of Care (TOC) CM/SW Contact  ?Conception Oms, RN ?Phone Number: ?08/03/2021, 1:45 PM ? ?Clinical Narrative:   Sent the bedsearch out  ?Completed Fl2, Obtained PASSR ?Called [facility] in [place] and spoke to Porter, she will review and see if they can offer a bed and let us know ? ? ? ?Expected Discharge Plan: [place] ?Barriers to Discharge: Continued Medical Work up ? ?Expected Discharge Plan and Services ?Expected Discharge Plan: [place] ?  ?Discharge Planning Services: CM Consult ?  ?Living arrangements for the past 2 months: [place] ?                ?DME Arranged: N/A ?  ?  ?  ?  ?HH Arranged: NA ?  ?  ?  ?  ? ? ?Social Determinants of Health (SDOH) Interventions ?  ? ?Readmission Risk Interventions ?   ? View : No data to display.  ?  ?  ?  ? ? ?

## 2021-08-03 NOTE — Assessment & Plan Note (Signed)
Meets criteria with BMI greater than 30 ?

## 2021-08-03 NOTE — Progress Notes (Signed)
[facility] Progress Note ? ?Patient: Brittany Benton    HGD:[id number]  DOA: 08/02/2021    ?Date of Service: the patient was seen and examined on 08/03/2021 ? ?Brief hospital course: ?66 year old female with past medical history of Parkinson's disease and obesity who presented to the emergency room on 4/19 with generalized weakness.  Patient related that for the past month, she has had difficulty eating and drinking with worsening weight loss and has gone from being ambulatory to essentially bedbound over the past.  Patient has been off of her Sinemet for Parkinson's for several weeks.  Patient look to be clinically dehydrated and was started on IV fluids.  She was brought in for further evaluation. ? ?Assessment and Plan: ?Assessment and Plan: ?* Dehydration ?- This is secondary to severe anorexia of questionable etiology with subsequent high anion gap metabolic acidosis. ?- The patient will be admitted to a medical monitor observation bed. ?- She will be hydrated with IV normal saline with added potassium chloride. ?- We will follow her BMP. ?- She could be having starvation ketosis given her elevated ketones. ?- Psychiatry consult will be obtained as well as dietitian consult. ?- I notified Dr. Weber Cooks about the patient. ? ?Physical deconditioning ?Seen by physical therapy.  Needs skilled nursing ? ?Parkinson's disease ([place]) ?- The patient will be continued on Sinemet IR.  Will discuss with patient for the need to take this. ? ?Hyponatremia ?- This is likely hypovolemic due to volume depletion and dehydration. ?- She will be hydrated with IV normal saline and will follow her BMP. ? ?Hypokalemia ?- Potassium will be replaced and magnesium level will be checked. ? ?Acute urinary retention ?Nursing did in and out cath on 4/20 yielding 1000 cc of urine.  Continue to monitor. ? ?AKI (acute kidney injury) ([place]) ?Mild.  Creatinine at 1.01 on admission and following hydration, down to 0.77. ? ?Essential (primary)  hypertension ?- We will continue her Toprol-XL. ? ?Lactic acidosis ?Secondary to intravascular volume depletion.  Patient does not have sepsis.  Procalcitonin level normal and no evidence of infection.  Sepsis has been ruled out.  Mild leukocytosis on admission was reactive and without antibiotics, normalized following hydration.  Lactic acid at 2.2 on admission, down to 1.0 following hydration. ? ?GERD without esophagitis ?- We will continue PPI therapy ? ?Dyslipidemia ?- We will continue statin therapy. ? ?Hypothyroidism ?- We will continue Synthroid.  TSH within normal limits ? ?Obesity (BMI 30-39.9) ?Meets criteria with BMI greater than 30 ? ? ? ? ? ? ?Body mass index is 34.76 kg/m?.  ?  ?   ? ?Consultants: ?Psychiatry ? ?Procedures: ?None ? ?Antimicrobials: ?None ? ?Code Status: Full code ? ? ?Subjective: Patient tearful, complains of weakness ? ?Objective: ?Vital signs were reviewed and unremarkable. ?Vitals:  ? 08/03/21 0206 08/03/21 0831  ?BP: 132/60 126/71  ?Pulse: (!) 102 98  ?Resp: 20 16  ?Temp: 98.9 ?F (37.2 ?C) 97.6 ?F (36.4 ?C)  ?SpO2: 100% 98%  ? ? ?Intake/Output Summary (Last 24 hours) at 08/03/2021 1641 ?Last data filed at 08/03/2021 1630 ?Gross per 24 hour  ?Intake 2200 ml  ?Output 1000 ml  ?Net 1200 ml  ? ?There were no vitals filed for this visit. ?Body mass index is 34.76 kg/m?. ? ?Exam: ? ?General: Alert and oriented x3, emotional ?HEENT: Normocephalic, atraumatic, mucous membranes slightly dry ?Cardiovascular: Regular rate and rhythm, borderline tachycardia ?Respiratory: Clear to auscultation bilaterally ?Abdomen: Soft, nontender, nondistended, positive bowel sounds ?Musculoskeletal: No clubbing or cyanosis,  trace pitting edema ?Skin: No skin breaks, tears or lesions ?Psychiatry: Emotional, but does not appear depressed.  No evidence of acute psychosis ?Neurology: Generalized weakness and observed tremor ? ?Data Reviewed: ?Noted low BUN, normalized lactic acid level ? ?Disposition:  ?Status is:  Inpatient ?Remains inpatient appropriate because: Evaluation of urinary retention.  Skilled nursing facility bed placement ?  ? ?Anticipated discharge date: 4/21 ? ?Remaining issues to be resolved so that patient can be discharged: Urinary retention ? ? ?Family Communication: Left message for husband ?DVT Prophylaxis: ? ? ? ? ?Author: ?Annita Brod ,MD ?08/03/2021 4:41 PM ? ?To reach On-call, see care teams to locate the attending and reach out via [email or web address]. ?Between 7PM-7AM, please contact night-coverage ?If you still have difficulty reaching the attending provider, please page the [facility] (Director on Call) for [facility] on amion for assistance. ? ?

## 2021-08-03 NOTE — Progress Notes (Signed)
Met with the patient and her family at the bedside ?The patient lives at home with her spouse, she has a rolling walker and a wheelchair at home ?She has been unable to ambulate the last week or so ?Her spouse stated that he would like her to go to STR ?I explained to him that the patient has a choice and the family can not make that decision without her being agreeable to it ?I explained to the patient what STR meant and what the process is ?I explained that PT would need to evaluate and make the recommendation, then I will look for a bed and then get ins approval ?Her husband said that they would like to get [facility] in [place] ?I explained that With the patient's permission if the recommendation is STR then I would reach out to [facility] and see if they have a bed, I also explained it is ideal to reach out to multiple facilities to have other options if needed ?The patient stated that her family has not mentioned this to her before and she was upset that they spring it on her like this, however she is agreeable to go to STR ?I explained that I will do the Fl2 and bedsearch after PT does an evaluation ?I also reviewed that she is under observation status and not inpatient ? ?

## 2021-08-03 NOTE — Assessment & Plan Note (Addendum)
Mild.  Creatinine at 1.01 on admission and with IV fluids, at day of discharge at 0.57. ?

## 2021-08-03 NOTE — Assessment & Plan Note (Addendum)
-   The patient had previously been on Sinemet.  She also did not want to take Requip.  She has had some concerns about taking these medicines, feeling like side effects are making her worse.  She had stopped taking this medication months ago and will not take it here.  Some of her issues appear to be related to conflict with her neurologist.  She was happy to be offered a referral to a new neurologist. ?

## 2021-08-03 NOTE — Assessment & Plan Note (Addendum)
-   This is secondary to severe anorexia of questionable etiology with subsequent high anion gap metabolic acidosis.  Continue IV fluids. ?There is likely some starvation ketosis going on ?

## 2021-08-03 NOTE — Care Management Obs Status (Signed)
MEDICARE OBSERVATION STATUS NOTIFICATION ? ? ?Patient Details  ?Name: Brittany Benton ?MRN: [id number] ?Date of Birth: 1956-04-08 ? ? ?Medicare Observation Status Notification Given:  Yes ? ? ? ?Conception Oms, RN ?08/03/2021, 10:39 AM ?

## 2021-08-03 NOTE — [place] FL2 (Signed)
?[place] MEDICAID FL2 LEVEL OF CARE SCREENING TOOL  ?  ? ?IDENTIFICATION  ?Patient Name: ?Brittany Benton Birthdate: 21-Oct-1955 Sex: female Admission Date (Current Location): ?08/02/2021  ?[place] and [place] Number: ?  ?  Facility and Address:  ?  ?     Provider Number: ?[id number]  ?Attending Physician Name and Address:  ?Annita Brod, MD ? Relative Name and Phone Number:  ?Deatra James [phone] ?   ?Current Level of Care: ?Hospital Recommended Level of Care: ?[place] Prior Approval Number: ?  ? ?Date Approved/Denied: ?  PASRR Number: ?[id number] ? ?Discharge Plan: ?SNF ?  ? ?Current Diagnoses: ?Patient Active Problem List  ? Diagnosis Date Noted  ? Parkinson's disease ([place]) 08/03/2021  ? Dyslipidemia 08/03/2021  ? GERD without esophagitis 08/03/2021  ? Hypokalemia 08/03/2021  ? Hyponatremia 08/03/2021  ? AKI (acute kidney injury) ([place]) 08/03/2021  ? Lactic acidosis 08/03/2021  ? Dehydration 08/02/2021  ? Meningioma ([place]) 04/05/2021  ? Hypothyroidism 08/15/2020  ? Osteopenia 06/14/2020  ? H/O gastric bypass 08/28/2016  ? Status post total hip replacement, 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  ? Osteoarthritis of right hip 03/26/2016  ? Status post bariatric surgery 03/26/2016  ? Sinus tachycardia 08/01/2015  ? Abnormal EKG 08/01/2015  ? Gastroesophageal reflux disease 06/08/2015  ? Malaise 06/08/2015  ? Obesity (BMI 30-39.9) 06/08/2015  ? OSA (obstructive sleep apnea) 06/08/2015  ? Arthritis of hip 03/23/2015  ? Left knee pain 02/15/2015  ? Vitamin D deficiency 09/29/2014  ? Lump of right breast 09/22/2014  ? Prediabetes 09/22/2014  ? Pruritic dermatitis 09/22/2014  ? Obstructive sleep apnea 09/22/2014  ? Allergic rhinitis 09/15/2009  ? Hypersomnia 09/14/2009  ? Panic disorder 08/06/2008  ? Essential (primary) hypertension 07/16/2007  ? Hyperlipidemia 07/16/2007  ? ? ?Orientation RESPIRATION BLADDER Height & Weight   ?  ?Self, Time, Situation, Place ? Normal Continent Weight:   ?Height:  '5\' 3"'$  (160  cm)  ?BEHAVIORAL SYMPTOMS/MOOD NEUROLOGICAL BOWEL NUTRITION STATUS  ?    Continent Diet (see dc summary)  ?AMBULATORY STATUS COMMUNICATION OF NEEDS Skin   ?Extensive Assist Verbally Normal ?  ?  ?  ?    ?     ?     ? ? ?Personal Care Assistance Level of Assistance  ?Bathing, Dressing, Feeding Bathing Assistance: Maximum assistance ?Feeding assistance: Limited assistance ?Dressing Assistance: Maximum assistance ?   ? ?Functional Limitations Info  ?    ?  ?   ? ? ?SPECIAL CARE FACTORS FREQUENCY  ?PT (By licensed PT), OT (By licensed OT)   ?  ?PT Frequency: 5 times per week ?OT Frequency: 5 times per week ?  ?  ?  ?   ? ? ?Contractures Contractures Info: Not present  ? ? ?Additional Factors Info  ?Code Status, Allergies Code Status Info: full code ?Allergies Info: Codeine, Morphine, Tape, Tramadol ?  ?  ?  ?   ? ?Current Medications (08/03/2021):  This is the current hospital active medication list ?Current Facility-Administered Medications  ?Medication Dose Route Frequency Provider Last Rate Last Admin  ? 0.9 %  sodium chloride infusion   Intravenous PRN Duffy Bruce, MD 50 mL/hr at 08/02/21 2237 New Bag at 08/02/21 2237  ? 0.9 % NaCl with KCl 20 mEq/ L  infusion   Intravenous Continuous Mansy, Jan A, MD 100 mL/hr at 08/03/21 0601 New Bag at 08/03/21 0601  ? acetaminophen (TYLENOL) tablet 650 mg  650 mg Oral Q6H PRN Mansy, Arvella Merles, MD      ?  Or  ? acetaminophen (TYLENOL) suppository 650 mg  650 mg Rectal Q6H PRN Mansy, Jan A, MD      ? atorvastatin (LIPITOR) tablet 20 mg  20 mg Oral QPM Mansy, Jan A, MD      ? cholecalciferol (VITAMIN D3) tablet 1,000 Units  1,000 Units Oral Daily Mansy, Jan A, MD      ? enoxaparin (LOVENOX) injection 45 mg  0.5 mg/kg Subcutaneous Q24H Mansy, Jan A, MD   45 mg at 08/03/21 7356  ? imipramine (TOFRANIL) tablet 50 mg  50 mg Oral BID Mansy, Jan A, MD      ? levothyroxine (SYNTHROID) tablet 50 mcg  50 mcg Oral Q0600 Mansy, Jan A, MD      ? magnesium hydroxide (MILK OF MAGNESIA) suspension  30 mL  30 mL Oral Daily PRN Mansy, Jan A, MD      ? metoprolol succinate (TOPROL-XL) 24 hr tablet 50 mg  50 mg Oral Q breakfast Mansy, Jan A, MD      ? multivitamin with minerals tablet   Oral Daily Mansy, Jan A, MD      ? ondansetron [facility]) tablet 4 mg  4 mg Oral Q6H PRN Mansy, Jan A, MD      ? Or  ? ondansetron [facility]) injection 4 mg  4 mg Intravenous Q6H PRN Mansy, Jan A, MD      ? pantoprazole (PROTONIX) EC tablet 40 mg  40 mg Oral Daily Mansy, Jan A, MD      ? potassium chloride (KLOR-CON) packet 40 mEq  40 mEq Oral Once Mansy, Jan A, MD      ? rOPINIRole (REQUIP) tablet 0.25 mg  0.25 mg Oral TID Mansy, Jan A, MD      ? sertraline (ZOLOFT) tablet 50 mg  50 mg Oral Daily Mansy, Jan A, MD      ? traZODone (DESYREL) tablet 25 mg  25 mg Oral QHS PRN Mansy, Arvella Merles, MD      ? ? ? ?Discharge Medications: ?Please see discharge summary for a list of discharge medications. ? ?Relevant Imaging Results: ? ?Relevant Lab Results: ? ? ?Additional Information ?SS# [id number] ? ?Conception Oms, RN ? ? ? ? ?

## 2021-08-03 NOTE — Assessment & Plan Note (Addendum)
-   We will continue Synthroid.  TSH within normal limits ?

## 2021-08-03 NOTE — Assessment & Plan Note (Addendum)
-   Potassium replaced.  By day of discharge at 3.5. ?

## 2021-08-03 NOTE — Progress Notes (Signed)
Patient states she's not taking her medications. ?

## 2021-08-03 NOTE — Assessment & Plan Note (Addendum)
-   This is likely hypovolemic due to volume depletion and dehydration. ?Received IV fluids and by day of discharge, up to 135. ?

## 2021-08-03 NOTE — Assessment & Plan Note (Signed)
Seen by physical therapy.  Needs skilled nursing ?

## 2021-08-03 NOTE — Progress Notes (Signed)
PHARMACIST - PHYSICIAN COMMUNICATION ? ?CONCERNING:  Enoxaparin (Lovenox) for DVT Prophylaxis  ? ? ?RECOMMENDATION: ?Patient was prescribed enoxaprin '40mg'$  q24 hours for VTE prophylaxis.  ? ?There were no vitals filed for this visit. ? ?Body mass index is 34.76 kg/m?. ? ?Estimated Creatinine Clearance: 56.4 mL/min (A) (by C-G formula based on SCr of 1.01 mg/dL (H)). ? ? ?Based on [place] patient is candidate for enoxaparin 0.'5mg'$ /kg TBW SQ every 24 hours based on BMI being >30. ? ?DESCRIPTION: ?Pharmacy has adjusted enoxaparin dose per [facility] policy. ? ?Patient is now receiving enoxaparin 0.5 mg/kg every 24 hours  ? ?Renda Rolls, PharmD, MBA ?08/03/2021 ?1:43 AM ? ?

## 2021-08-04 DIAGNOSIS — R338 Other retention of urine: Secondary | ICD-10-CM | POA: Diagnosis not present

## 2021-08-04 DIAGNOSIS — E860 Dehydration: Secondary | ICD-10-CM | POA: Diagnosis not present

## 2021-08-04 DIAGNOSIS — N179 Acute kidney failure, unspecified: Secondary | ICD-10-CM | POA: Diagnosis not present

## 2021-08-04 DIAGNOSIS — G20 Parkinson's disease: Secondary | ICD-10-CM | POA: Diagnosis not present

## 2021-08-04 LAB — IRON: Iron: 53 ug/dL (ref 28–170)

## 2021-08-04 LAB — VITAMIN D 25 HYDROXY (VIT D DEFICIENCY, FRACTURES): Vit D, 25-Hydroxy: 50.84 ng/mL (ref 30–100)

## 2021-08-04 LAB — FOLATE: Folate: 4.7 ng/mL — ABNORMAL LOW (ref >5.9)

## 2021-08-04 LAB — VITAMIN B12: Vitamin B-12: 194 pg/mL (ref 180–914)

## 2021-08-04 MED ORDER — CALCIUM CARBONATE ANTACID 500 MG PO CHEW
1.0000 | CHEWABLE_TABLET | Freq: Three times a day (TID) | ORAL | Status: DC
  Administered 2021-08-04 – 2021-08-05 (×4): 200 mg via ORAL
  Filled 2021-08-04 (×4): qty 1

## 2021-08-04 MED ORDER — CHLORHEXIDINE GLUCONATE CLOTH 2 % EX PADS
6.0000 | MEDICATED_PAD | Freq: Every day | CUTANEOUS | Status: DC
  Administered 2021-08-04 – 2021-08-05 (×2): 6 via TOPICAL

## 2021-08-04 MED ORDER — ADULT MULTIVITAMIN W/MINERALS CH
1.0000 | ORAL_TABLET | Freq: Two times a day (BID) | ORAL | Status: DC
  Administered 2021-08-05: 1 via ORAL
  Filled 2021-08-04 (×2): qty 1

## 2021-08-04 NOTE — Progress Notes (Addendum)
Initial Nutrition Assessment ? ?DOCUMENTATION CODES:  ? ?Obesity unspecified ? ?INTERVENTION:  ? ?-MVI with minerals BID ?-500 mg calcium carbonate TID ?-Liberalize diet to regular ?-Await SLP evaluation; case discussed with MD and this was ordered to rule out swallow dysfunction related to Parkinson's ?-Draw labs to assess for potential micronutrient deficiencies for bariatric surgery: iron, folic acid, thiamine, copper, zinc, vitamin B-12, vitamin D, vitamin A, vitamin E, and vitamin K; will follow-up with supplementation if deficiencies are present ? ?NUTRITION DIAGNOSIS:  ? ?Inadequate oral intake related to poor appetite as evidenced by meal completion < 25%, per patient/family report. ? ?GOAL:  ? ?Patient will meet greater than or equal to 90% of their needs ? ?MONITOR:  ? ?PO intake, Supplement acceptance, Labs, Weight trends, Skin, I & O's ? ?REASON FOR ASSESSMENT:  ? ?Consult, Malnutrition Screening Tool ?Assessment of nutrition requirement/status ? ?ASSESSMENT:  ? ?Brittany Benton is a 66 y.o. Caucasian female with medical history significant for chronic medical problems that are mentioned below, who presented to the emergency room with acute onset of significant anorexia over the last 5 weeks with generalized weakness.  She denies any nausea or vomiting or abdominal pain.  No dysphagia.  She stated that all types of foods taste like salt.  She believes that this may be related to her recently started Sinemet for Parkinson's though this was about a week ago.  No dysuria, oliguria or hematuria or flank pain.  She admitted to be severely depressed and admitted to anxiety.  She is not suicidal. ? ?Pt admitted with dehydration and Parkinson's disease.  ? ?Reviewed I/O's: +985 ml x 24 hours and +3.2 L since admission ? ?UOP: 1 L x 24 hours  ? ?Spoke with pt and husband. Pt expresses that she is frustrated over her significant general decline in health over the past few weeks secondary to Parkinson's disease. Pt  explains that she was very active prior to her diagnosis, however, she has declined significant over the past month, especially over the past week. She is frustrated that her tremors have gotten worse. Pt also shares that she stopped taking sinemet about a month ago due to side effects. Pt complained of nausea and spit up saliva in emesis bin; RD offered pt emesis bag and also provided additional towels for comfort.  ? ?Per pt husband, intake has been decreased over the past month, but took a significant drop over the past week. Pt has been consuming mostly bites and sips of foods and liquids. Pt shares that it is difficult to hold food in her mouth and swallow food, as she has a very salty taste in her mouth. Pt husband also noted further swallowing issues when pt was being held in the ED; he explains that when giving her pills, pt held them in her mouth and pt complained that she felt like the pills were expanding in her mouth. This morning, pt was frustrated that she felt forced to take her medications quickly and was upset over the offer to crush them in applesauce. She perceives that she is able to take her pills, but requires additional time to do so, especially since her throat feels dry and scratchy (pt thinks that this will resolve if she can drink water and correct the dehydration).  ? ?Observed meal tray- pt consumed only a few bites of yogurt. She states "I want to eat, but I just can't do it". Pt shares that she has tried nutritional supplements in the past and  refused offer at this time.  ? ?Had long discussion with pt and husband about how address nutrition moving forward. Recommended swallow evaluation (which pt reports she has never received in the past) to help rule out any swallowing deficits; pt and husband agreeable to this plan. Also gently broached possibility of feeding tube if pt continues to have inadequate oral intake or swallowing difficulties. Pt felt very overwhelmed by this  recommendation as well as all the changes going on in her life (declining health, transition to SNF, and her son having open heart surgery at [facility] next week). RD validated pt feelings and provided space to provide her with emotional support. Pt very appreciative of this RD visiting and expressed gratitude for RD listening to her concerns.  ? ?Case discussed with SLP; pt underwent gastric sleeve at [facility] in 2017. RD will order labs to assess for potential vitamin and mineral deficiencies.  ? ?Reviewed wt hx; pt has experienced a 5.8% wt loss over the past year, which is not significant for time frame.  ? ?Medications reviewed and include potassium chloride.  ? ?Labs reviewed: Na: 132, CBGS: 66 (inpatient orders for glycemic control are none).   ? ?NUTRITION - FOCUSED PHYSICAL EXAM: ? ?Flowsheet Row Most Recent Value  ?Orbital Region No depletion  ?Upper Arm Region Mild depletion  ?Thoracic and Lumbar Region No depletion  ?Buccal Region No depletion  ?Temple Region No depletion  ?Clavicle Bone Region No depletion  ?Clavicle and Acromion Bone Region No depletion  ?Scapular Bone Region No depletion  ?Dorsal Hand No depletion  ?Patellar Region Mild depletion  ?Anterior Thigh Region Mild depletion  ?Posterior Calf Region Mild depletion  ?Edema (RD Assessment) Mild  ?Hair Reviewed  ?Eyes Reviewed  ?Mouth Reviewed  ?Skin Reviewed  ?Nails Reviewed  ? ?  ? ? ?Diet Order:   ?Diet Order   ? ?       ?  Diet regular Room service appropriate? Yes; Fluid consistency: Thin  Diet effective now       ?  ? ?  ?  ? ?  ? ? ?EDUCATION NEEDS:  ? ?Education needs have been addressed ? ?Skin:  Skin Assessment: Reviewed RN Assessment ? ?Last BM:  08/03/21 ? ?Height:  ? ?Ht Readings from Last 1 Encounters:  ?08/02/21 '5\' 3"'$  (1.6 m)  ? ? ?Weight:  ? ?Wt Readings from Last 1 Encounters:  ?07/28/21 89 kg  ? ? ?Ideal Body Weight:  52.3 kg ? ?BMI:  Body mass index is 34.76 kg/m?. ? ?Estimated Nutritional Needs:  ? ?Kcal:  1650-1850 ? ?Protein:   85-100 grams ? ?Fluid:  > 1.6 L ? ? ? ?Loistine Chance, RD, LDN, CDCES ?Registered Dietitian II ?Certified Diabetes Care and Education Specialist ?Please refer to [facility] for RD and/or RD on-call/weekend/after hours pager  ?

## 2021-08-04 NOTE — Progress Notes (Signed)
PT Cancellation Note ? ?Patient Details ?Name: Brittany Benton ?MRN: [id number] ?DOB: 04-16-1956 ? ? ?Cancelled Treatment:    Reason Eval/Treat Not Completed: Other (comment);Patient not medically ready On 2nd attempt patient was with nursing for foley catheter placement. Will re-attempt PT treatment at later date/time as patient is available.  ? ?Iva Boop, PT  ?08/04/21. 2:03 PM ? ?

## 2021-08-04 NOTE — Progress Notes (Signed)
PT Cancellation Note ? ?Patient Details ?Name: Brittany Benton ?MRN: [id number] ?DOB: Sep 05, 1955 ? ? ?Cancelled Treatment:    Reason Eval/Treat Not Completed: Other (comment) Due to patient in the middle of a speech evaluation, will hold on PT at this time, and will re-attempt at a later time/date as available and patient medically appropriate for PT. Thank you! ? ? ?Iva Boop, PT  ?08/04/21. 12:51 PM ? ?

## 2021-08-04 NOTE — Evaluation (Signed)
Clinical/Bedside Swallow Evaluation ?Patient Details  ?Name: Brittany Benton ?MRN: [id number] ?Date of Birth: Mar 19, 1956 ? ?Today's Date: 08/04/2021 ?Time: SLP Start Time (ACUTE ONLY): 1210 SLP Stop Time (ACUTE ONLY): 8299 ?SLP Time Calculation (min) (ACUTE ONLY): 55 min ? ?Past Medical History:  ?Past Medical History:  ?Diagnosis Date  ? Anxiety   ? Arthritis   ? osteoarthritis -right hip  ? 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 GERD (gastroesophageal reflux disease)   ? Hernia, incisional   ? after renal surgery  ? History of chicken pox   ? Hyperlipidemia   ? Renal cell carcinoma ([place])   ? s/p right nephrectomy 1994  ? Sleep apnea   ? ?Past Surgical History:  ?Past Surgical History:  ?Procedure Laterality Date  ? APPENDECTOMY  1994  ? BREAST BIOPSY Right 1991  ? Negative  ? CHOLECYSTECTOMY  1994  ? LAPAROSCOPIC GASTRIC SLEEVE RESECTION  02/02/2016  ? Dr Duke Salvia at [place]  ? NEPHRECTOMY  1994  ? Renal Cell Carcinoma  ? parotid gland removal  1990  ? also removed a tumor  ? TONSILLECTOMY    ? TOTAL HIP ARTHROPLASTY Right 08/08/2016  ? Procedure: TOTAL HIP ARTHROPLASTY;  Surgeon: Earnestine Leys, MD;  Location: [facility] ORS;  Service: Orthopedics;  Laterality: Right;  ? ?HPI:  ?Pt is a 66 year old female with past medical history of dx of possible Parkinson's disease, HTN, GERD, obesity and Bariatric Surgery ~5 years ago who presented to the emergency room on 08/02/21 with generalized weakness.  Patient related that for the past month, she has had difficulty eating and drinking with poor appetite and distaste of foods w/ worsening weight loss and has gone from being ambulatory to essentially bedbound over the past month.  She has also stopped taking her Sinemet prescribed by her Neurologist d/t is causing her N/V.  Patient has been off of her Sinemet for Parkinson's for ~3-4 weeks after taking it since the beginning of March 2023.  At admit, patient look to be clinically dehydrated and was started on IV fluids.  She  was brought in for further evaluation. In addition, patient has been having issues with urinary retention.  There were concerns about her nutritional intake.  Dietician is following.   IMAGING: lung fields are clear.  ?  ?Assessment / Plan / Recommendation  ?Clinical Impression ?  Pt seen for BSE today around Lunch time. Pt resting in bed and needed min positioning more upright upright in bed. Husband present in room. Pt talkative w/ this Clinician and engaging describing her issues w/ poor appetite, less desire for eating over the past ~2 months, and her mild anxiety about swallowing. Pt described that recent Sinemet medication caused her N/V to the point she had to stop taking it -- of course, N/V probably impacted her desire to eat as well.  ? ?OF NOTE: Encouraged pt and Husband to seek further consult w/ Neurology to manage potential Parkinson's Dis(recent dx made by MD).  ?ALSO OF NOTE: Pt has a h/o Bariatric Surgery ~5 years ago, per pt report. In recent months, she has been eating less stating NO DESIRE TO EAT as well as a CHANGE IN TASTE of foods/liquids -- "SALTY" taste to "everything". 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 Unsure if further metabolic impact. Recommend f/u w/ a Bariatric Dietician and MD for full assessment of nutrition.   ?   ?NO oropharyngeal phase dysphagia was  noted at bedside.  ?  ?Pt appears at reduced risk for aspiration from an oropharyngeal phase standpoint when following general aspiration precautions. W/ ANY Esophageal Dysmotility d/t the Bariatric Surgery and GERD, pt could be at risk for aspiration of Regurgitated food/liquid material.  ANY Dysmotility or Regurgitation of Reflux material can increase risk for aspiration of the Reflux material during Retrograde backflow thus impact Voicing and Pulmonary status. Pt does have early satiety.   ?  ?Pt appears to present  w/ adequate oropharyngeal phase swallow w/ No oropharyngeal phase dysphagia noted, No neuromuscular deficits noted. Pt consumed po trials w/ No overt, clinical s/s of aspiration during po trials. During po trials, pt consumed all consistencies w/ no overt coughing, decline in vocal quality, or change in respiratory presentation during/post trials. Oral phase appeared [facility] w/ grossly timely bolus management, mastication, and control of bolus propulsion for A-P transfer for swallowing. Oral clearing achieved w/ all trial consistencies. OM Exam appeared [facility] w/ no unilateral weakness noted. Speech Clear. Pt fed self w/ setup support.  ?  ?Recommend continue a fairly Regular consistency diet w/ well-Cut meats/foods, moistened foods; Thin liquids VIA CUP - no/less straw use to reduce air swallowed. Recommend general aspiration precautions and STRICT REFLUX PRECAUTIONS. Pills WHOLE in Puree IF easier swallowing and clearing of the Esophagus per pt's wishes. Reduce distractions around mealtime to reduce any stress. Education given on Pills in Puree IF desired for ease; food consistencies/options and easy to eat options; moistening foods; general aspiration and REFLUX precautions. Recommend f/u w/ GI for ongoing assessment and management of the Esophagus and Esophageal motility s/p Bariatric Surgery; f/u w/ a Dietician post discharge. MD updated. No further skilled ST services indicated. MD to reconsult if any new needs arise.  ? ? 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   ?Aspiration Risk ?  (reduced)  ?  ?Diet Recommendation   A fairly Regular consistency diet w/ well-Cut meats/foods, moistened foods; Thin liquids VIA CUP - no/less straw use to reduce air swallowed. Recommend general aspiration precautions and STRICT REFLUX PRECAUTIONS. Reduce  distractions around mealtime to reduce any stress.  ? ?Medication Administration: Whole meds with liquid (or in a puree if desired)  ?  ?Other  Recommendations Recommended Consults: Consider GI evaluation;Consider esophageal assessment (f/u for discussion re: Bariatric Surgery and GI issues) ?Oral Care Recommendations: Oral care BID;Oral care before and after PO;Patient independent with oral care (setup) ?Other Recommendations:  (n/a)   ? ?Recommendations for follow up therapy are one component of a multi-disciplinary discharge planning process, led by the attending physician.  Recommendations may be updated based on patient status, additional functional criteria and insurance authorization. ? ?Follow up Recommendations No SLP follow up as pt does not present w/ s/s of oropharyngeal phase dysphagia ? ? ?  ?Assistance Recommended at Discharge Set up Supervision/Assistance  ?Functional Status Assessment Patient has had a recent decline in their functional status and demonstrates the ability to make significant improvements in function in a reasonable and predictable amount of time.  ?Frequency and Duration  (n/a)  ? (n/a) ?  ?   ? ?Prognosis Prognosis for Safe Diet Advancement: Good ?Barriers to Reach Goals: Time post onset;Severity of deficits;Behavior;Motivation ?Barriers/Prognosis Comment: anxiety  ? ?  ? ?Swallow Study   ?General Date of Onset: 08/02/21 ?HPI: Pt is a 66 year old female with past medical history of  dx of possible Parkinson's disease, HTN, GERD, obesity and Bariatric Surgery ~5 years ago who presented to the emergency room on 08/02/21 with generalized weakness.  Patient related that for the past month, she has had difficulty eating and drinking with poor appetite and distaste of foods w/ worsening weight loss and has gone from being ambulatory to essentially bedbound over the past month.  She has also stopped taking her Sinemet prescribed by her Neurologist d/t is causing her N/V.  Patient has been  off of her Sinemet for Parkinson's for ~3-4 weeks after taking it since the beginning of March 2023.  At admit, patient look to be clinically dehydrated and was started on IV fluids.  She was brought in for furth

## 2021-08-04 NOTE — Progress Notes (Signed)
OT Cancellation Note ? ?Patient Details ?Name: Brittany Benton ?MRN: [id number] ?DOB: 04/05/1956 ? ? ?Cancelled Treatment:    Reason Eval/Treat Not Completed: Other (comment). Consult received, chart reviewed. Pt working with SLP upon attempt. Will re-attempt at later date/time as pt is available.  ? ? ?Ardeth Perfect., MPH, MS, OTR/L ?ascom [phone] ?08/04/21, 12:50 PM ? ?

## 2021-08-04 NOTE — TOC Progression Note (Addendum)
Transition of Care (TOC) - Progression Note  ? ? ?Patient Details  ?Name: Brittany Benton ?MRN: [id number] ?Date of Birth: [birth year] ? ?Transition of Care (TOC) CM/SW Contact  ?Jadalynn Burr E Dorothye Berni, LCSW ?Phone Number: ?08/04/2021, 9:11 AM ? ?Clinical Narrative:   Patient has bed offers at [facility], and [facility]  ?Rennis Golden at [facility] (patient's top choice) who confirms they cannot take patient.  ?Spoke to patient who defers to her sister Brittany Benton [phone]). She asked that CSW add Brittany Benton to her contact list.  ?Sande Rives who stated their next choice is [facility].  ?Accepted bed offer with Tammy at [facility].  ?Will need insurance auth. Asked MD if medically ready.  ? ? ?1:25- Per MD in rounds, patient should be medically ready for [facility] tomorrow.  ?CSW spoke to Tammy at [facility] who confirmed they can take patient tomorrow 4/22 pending auth. ?CSW started [place] in [place].  ? ? ?Expected Discharge Plan: [place] ?Barriers to Discharge: Continued Medical Work up ? ?Expected Discharge Plan and Services ?Expected Discharge Plan: [place] ?  ?Discharge Planning Services: CM Consult ?  ?Living arrangements for the past 2 months: [place] ?                ?DME Arranged: N/A ?  ?  ?  ?  ?HH Arranged: NA ?  ?  ?  ?  ? ? ?Social Determinants of Health (SDOH) Interventions ?  ? ?Readmission Risk Interventions ?   ? View : No data to display.  ?  ?  ?  ? ? ?

## 2021-08-04 NOTE — Progress Notes (Signed)
[facility] Progress Note ? ?Patient: Brittany Benton    QHU:[id number]  DOA: 08/02/2021    ?Date of Service: the patient was seen and examined on 08/04/2021 ? ?Brief hospital course: ?66 year old female with past medical history of Parkinson's disease and obesity who presented to the emergency room on 4/19 with generalized weakness.  Patient related that for the past month, she has had difficulty eating and drinking with worsening weight loss and has gone from being ambulatory to essentially bedbound over the past.  Patient has been off of her Sinemet for Parkinson's for several weeks.  Patient look to be clinically dehydrated and was started on IV fluids.  She was brought in for further evaluation. ? ?Following hospitalization, patient found to be very deconditioned and recommendation is for skilled nursing.  In addition, patient has been having issues with urinary retention.  There were concerns about her nutritional intake.  She was seen by speech and much of her swallowing issues appear to be more functional in terms of mindset rather than an actual swallowing issue.  Nutrition is following. ? ?Assessment and Plan: ?Assessment and Plan: ?* Dehydration ?- This is secondary to severe anorexia of questionable etiology with subsequent high anion gap metabolic acidosis.  Continue IV fluids. ?There is likely some starvation ketosis going on ? ?Physical deconditioning ?Seen by physical therapy.  Needs skilled nursing ? ?Parkinson's disease ([place]) ?- The patient will be continued on Sinemet IR.  Will discuss with patient for the need to take this. ? ?Hyponatremia ?- This is likely hypovolemic due to volume depletion and dehydration. ?- She will be hydrated with IV normal saline and will follow her BMP. ? ?Hypokalemia ?- Potassium will be replaced and magnesium level will be checked. ? ?Acute urinary retention ?Nursing did in and out cath on 4/20 yielding 1000 cc of urine.  Continue to monitor.  Continuing to have  issues, fix electrolyte abnormalities ? ?AKI (acute kidney injury) ([place]) ?Mild.  Creatinine at 1.01 on admission and following hydration, down to 0.77. ? ?Essential (primary) hypertension ?- We will continue her Toprol-XL. ? ?Lactic acidosis ?Secondary to intravascular volume depletion.  Patient does not have sepsis.  Procalcitonin level normal and no evidence of infection.  Sepsis has been ruled out.  Mild leukocytosis on admission was reactive and without antibiotics, normalized following hydration.  Lactic acid at 2.2 on admission, down to 1.0 following hydration. ? ?GERD without esophagitis ?- We will continue PPI therapy ? ?Dyslipidemia ?- We will continue statin therapy. ? ?Hypothyroidism ?- We will continue Synthroid.  TSH within normal limits ? ?Obesity (BMI 30-39.9) ?Meets criteria with BMI greater than 30 ? ? ? ? ? ? ?Body mass index is 34.76 kg/m?Marland Kitchen  ?Nutrition Problem: Inadequate oral intake ?Etiology: poor appetite ?   ? ?Consultants: ?Psychiatry ? ?Procedures: ?None ? ?Antimicrobials: ?None ? ?Code Status: Full code ? ? ?Subjective: Patient doing okay, complains of dry mouth ?Objective: ?Vital signs were reviewed and unremarkable. ?Vitals:  ? 08/04/21 1155 08/04/21 1547  ?BP: 121/69 110/64  ?Pulse: 89 92  ?Resp:    ?Temp: 97.7 ?F (36.5 ?C) 98.3 ?F (36.8 ?C)  ?SpO2: 100% 100%  ? ? ?Intake/Output Summary (Last 24 hours) at 08/04/2021 1620 ?Last data filed at 08/04/2021 1617 ?Gross per 24 hour  ?Intake 1984.63 ml  ?Output 1700 ml  ?Net 284.63 ml  ? ? ?There were no vitals filed for this visit. ?Body mass index is 34.76 kg/m?. ? ?Exam: ? ?General: Alert and oriented x3, no  acute distress ?HEENT: Normocephalic, atraumatic, mucous membranes slightly dry ?Cardiovascular: Regular rate and rhythm, borderline tachycardia ?Respiratory: Clear to auscultation bilaterally ?Abdomen: Soft, nontender, nondistended, positive bowel sounds ?Musculoskeletal: No clubbing or cyanosis, trace pitting edema ?Skin: No skin breaks,  tears or lesions ?Psychiatry: Emotional, but does not appear depressed.  No evidence of acute psychosis ?Neurology: Generalized weakness and occasional tremor ? ?Data Reviewed: ?No labs today ? ?Disposition:  ?Status is: Inpatient ?Remains inpatient appropriate because: Evaluation of urinary retention.  ? ?Anticipated discharge date: 4/22 ? ?Remaining issues to be resolved so that patient can be discharged: Urinary retention ? ? ?Family Communication: Husband at the bedside ?DVT Prophylaxis: ? ? ? ? ?Author: ?Annita Brod ,MD ?08/04/2021 4:20 PM ? ?To reach On-call, see care teams to locate the attending and reach out via [email or web address]. ?Between 7PM-7AM, please contact night-coverage ?If you still have difficulty reaching the attending provider, please page the [facility] (Director on Call) for [facility] on amion for assistance. ? ?

## 2021-08-04 NOTE — Progress Notes (Signed)
OT Cancellation Note ? ?Patient Details ?Name: Brittany Benton ?MRN: [id number] ?DOB: [birth year] ? ? ?Cancelled Treatment:    Reason Eval/Treat Not Completed: Other (comment). On 2nd attempt with nursing for foley catheter placement. Will re-attempt OT evaluation at later date/time as pt is available.  ? ?Ardeth Perfect., MPH, MS, OTR/L ?ascom [phone] ?08/04/21, 1:59 PM ?

## 2021-08-05 DIAGNOSIS — F32A Depression, unspecified: Secondary | ICD-10-CM | POA: Diagnosis not present

## 2021-08-05 DIAGNOSIS — Z960 Presence of urogenital implants: Secondary | ICD-10-CM | POA: Diagnosis not present

## 2021-08-05 DIAGNOSIS — G4700 Insomnia, unspecified: Secondary | ICD-10-CM | POA: Diagnosis not present

## 2021-08-05 DIAGNOSIS — R9389 Abnormal findings on diagnostic imaging of other specified body structures: Secondary | ICD-10-CM | POA: Diagnosis not present

## 2021-08-05 DIAGNOSIS — E43 Unspecified severe protein-calorie malnutrition: Secondary | ICD-10-CM | POA: Diagnosis not present

## 2021-08-05 DIAGNOSIS — R29898 Other symptoms and signs involving the musculoskeletal system: Secondary | ICD-10-CM | POA: Diagnosis not present

## 2021-08-05 DIAGNOSIS — R5381 Other malaise: Secondary | ICD-10-CM | POA: Diagnosis not present

## 2021-08-05 DIAGNOSIS — N390 Urinary tract infection, site not specified: Secondary | ICD-10-CM | POA: Diagnosis not present

## 2021-08-05 DIAGNOSIS — E559 Vitamin D deficiency, unspecified: Secondary | ICD-10-CM | POA: Diagnosis not present

## 2021-08-05 DIAGNOSIS — R338 Other retention of urine: Secondary | ICD-10-CM | POA: Diagnosis not present

## 2021-08-05 DIAGNOSIS — I499 Cardiac arrhythmia, unspecified: Secondary | ICD-10-CM | POA: Diagnosis not present

## 2021-08-05 DIAGNOSIS — R251 Tremor, unspecified: Secondary | ICD-10-CM | POA: Diagnosis not present

## 2021-08-05 DIAGNOSIS — R9431 Abnormal electrocardiogram [ECG] [EKG]: Secondary | ICD-10-CM | POA: Diagnosis not present

## 2021-08-05 DIAGNOSIS — M4316 Spondylolisthesis, lumbar region: Secondary | ICD-10-CM | POA: Diagnosis not present

## 2021-08-05 DIAGNOSIS — Z743 Need for continuous supervision: Secondary | ICD-10-CM | POA: Diagnosis not present

## 2021-08-05 DIAGNOSIS — E1100 Type 2 diabetes mellitus with hyperosmolarity without nonketotic hyperglycemic-hyperosmolar coma (NKHHC): Secondary | ICD-10-CM | POA: Diagnosis not present

## 2021-08-05 DIAGNOSIS — R600 Localized edema: Secondary | ICD-10-CM | POA: Diagnosis not present

## 2021-08-05 DIAGNOSIS — R0902 Hypoxemia: Secondary | ICD-10-CM | POA: Diagnosis not present

## 2021-08-05 DIAGNOSIS — R109 Unspecified abdominal pain: Secondary | ICD-10-CM | POA: Diagnosis not present

## 2021-08-05 DIAGNOSIS — E785 Hyperlipidemia, unspecified: Secondary | ICD-10-CM | POA: Diagnosis not present

## 2021-08-05 DIAGNOSIS — R498 Other voice and resonance disorders: Secondary | ICD-10-CM | POA: Diagnosis not present

## 2021-08-05 DIAGNOSIS — R488 Other symbolic dysfunctions: Secondary | ICD-10-CM | POA: Diagnosis not present

## 2021-08-05 DIAGNOSIS — E876 Hypokalemia: Secondary | ICD-10-CM | POA: Diagnosis not present

## 2021-08-05 DIAGNOSIS — E119 Type 2 diabetes mellitus without complications: Secondary | ICD-10-CM | POA: Diagnosis not present

## 2021-08-05 DIAGNOSIS — E860 Dehydration: Secondary | ICD-10-CM | POA: Diagnosis not present

## 2021-08-05 DIAGNOSIS — R339 Retention of urine, unspecified: Secondary | ICD-10-CM | POA: Diagnosis not present

## 2021-08-05 DIAGNOSIS — N179 Acute kidney failure, unspecified: Secondary | ICD-10-CM | POA: Diagnosis not present

## 2021-08-05 DIAGNOSIS — Z7401 Bed confinement status: Secondary | ICD-10-CM | POA: Diagnosis not present

## 2021-08-05 DIAGNOSIS — M6259 Muscle wasting and atrophy, not elsewhere classified, multiple sites: Secondary | ICD-10-CM | POA: Diagnosis not present

## 2021-08-05 DIAGNOSIS — E872 Acidosis, unspecified: Secondary | ICD-10-CM

## 2021-08-05 DIAGNOSIS — R2681 Unsteadiness on feet: Secondary | ICD-10-CM | POA: Diagnosis not present

## 2021-08-05 DIAGNOSIS — E871 Hypo-osmolality and hyponatremia: Secondary | ICD-10-CM | POA: Diagnosis not present

## 2021-08-05 DIAGNOSIS — R1030 Lower abdominal pain, unspecified: Secondary | ICD-10-CM | POA: Diagnosis not present

## 2021-08-05 DIAGNOSIS — I10 Essential (primary) hypertension: Secondary | ICD-10-CM | POA: Diagnosis not present

## 2021-08-05 DIAGNOSIS — Z741 Need for assistance with personal care: Secondary | ICD-10-CM | POA: Diagnosis not present

## 2021-08-05 DIAGNOSIS — G20 Parkinson's disease: Secondary | ICD-10-CM | POA: Diagnosis not present

## 2021-08-05 DIAGNOSIS — E039 Hypothyroidism, unspecified: Secondary | ICD-10-CM | POA: Diagnosis not present

## 2021-08-05 DIAGNOSIS — R112 Nausea with vomiting, unspecified: Secondary | ICD-10-CM | POA: Diagnosis not present

## 2021-08-05 DIAGNOSIS — M6281 Muscle weakness (generalized): Secondary | ICD-10-CM | POA: Diagnosis not present

## 2021-08-05 LAB — CBC
HCT: 32.9 % — ABNORMAL LOW (ref 36.0–46.0)
Hemoglobin: 10.9 g/dL — ABNORMAL LOW (ref 12.0–15.0)
MCH: 25.7 pg — ABNORMAL LOW (ref 26.0–34.0)
MCHC: 33.1 g/dL (ref 30.0–36.0)
MCV: 77.6 fL — ABNORMAL LOW (ref 80.0–100.0)
Platelets: 186 10*3/uL (ref 150–400)
RBC: 4.24 MIL/uL (ref 3.87–5.11)
RDW: 16.8 % — ABNORMAL HIGH (ref 11.5–15.5)
WBC: 3.8 10*3/uL — ABNORMAL LOW (ref 4.0–10.5)
nRBC: 0 % (ref 0.0–0.2)

## 2021-08-05 LAB — COMPREHENSIVE METABOLIC PANEL
ALT: 14 U/L (ref 0–44)
AST: 26 U/L (ref 15–41)
Albumin: 2.5 g/dL — ABNORMAL LOW (ref 3.5–5.0)
Alkaline Phosphatase: 52 U/L (ref 38–126)
Anion gap: 9 (ref 5–15)
BUN: <5 mg/dL — ABNORMAL LOW (ref 8–23)
CO2: 21 mmol/L — ABNORMAL LOW (ref 22–32)
Calcium: 7.9 mg/dL — ABNORMAL LOW (ref 8.9–10.3)
Chloride: 105 mmol/L (ref 98–111)
Creatinine, Ser: 0.57 mg/dL (ref 0.44–1.00)
GFR, Estimated: >60 mL/min (ref >60)
Glucose, Bld: 61 mg/dL — ABNORMAL LOW (ref 70–99)
Potassium: 3.5 mmol/L (ref 3.5–5.1)
Sodium: 135 mmol/L (ref 135–145)
Total Bilirubin: 0.9 mg/dL (ref 0.3–1.2)
Total Protein: 4.9 g/dL — ABNORMAL LOW (ref 6.5–8.1)

## 2021-08-05 MED ORDER — IMIPRAMINE HCL 25 MG PO TABS: 25.0000 mg | ORAL_TABLET | Freq: Two times a day (BID) | ORAL | 3 refills | Status: DC

## 2021-08-05 NOTE — Evaluation (Signed)
Occupational Therapy Evaluation ?Patient Details ?Name: Brittany Benton ?MRN: [id number] ?DOB: 1955/05/17 ?Today's Date: 08/05/2021 ? ? ?History of Present Illness Patient is a 66 year old female who presented to [facility] with  acute onset of significant anorexia over the last 5 weeks with generalized weakness, and dehydration. Patient has a PMH (+) for PD, anixety, GERD, and sleep apnea.  ? ?Clinical Impression ?  ?Pt. Presents with weakness, limited activity tolerance, and limited functional mobility which limits her ability to complete basic ADL and IADL functioning. Pt. Resides at home with her husband who assists with ADL, and IADL tasks including meal preparation, medication management, and performing home management, and household tasks. Pt. was receiving home health OT, and PT services. Pt.'s family reports a progressive decline, and has been bedridden over the past 7-10 days. Pt. Required maxA to perform supine to sit to the EOB, pt. Presents with posterior leaning requiring cues for upright midline sitting. Pt. Presents with poor unsupported sitting balance. Pt. Worked on challenged reaching in multiple high, and low plan, as well as crossing midline. With assist to maintain sitting at the EOB. Pt. Requires maxA for self-dressing. Pt. Tolerated initiation of BUE UE strengthening with yellow theraband for bilateral shoulder flexion, and horizontal shoulder abduction. Pt. Will benefit from OT services for ADL training, A/E training, and pt. education about home modification, and DME. Pt. Will need SNF level of care upon discharge, with follow-up OT services.  ?   ? ?Recommendations for follow up therapy are one component of a multi-disciplinary discharge planning process, led by the attending physician.  Recommendations may be updated based on patient status, additional functional criteria and insurance authorization.  ? ?Follow Up Recommendations ? Skilled nursing-short term rehab (<3 hours/day)  ?  ?Assistance  Recommended at Discharge    ?Patient can return home with the following A lot of help with bathing/dressing/bathroom;A lot of help with walking and/or transfers;Direct supervision/assist for financial management;Assistance with feeding;Assistance with cooking/housework;Direct supervision/assist for medications management ? ?  ?Functional Status Assessment ? Patient has had a recent decline in their functional status and demonstrates the ability to make significant improvements in function in a reasonable and predictable amount of time.  ?Equipment Recommendations ? BSC/3in1  ?  ?Recommendations for Other Services   ? ? ?  ?Precautions / Restrictions Precautions ?Precautions: Fall ?Restrictions ?Weight Bearing Restrictions: No  ? ?  ? ?Mobility Bed Mobility ?Overal bed mobility: Needs Assistance ?Bed Mobility: Supine to Sit, Sit to Supine ?  ?  ?Supine to sit: Max assist ?Sit to supine: Max assist ?  ?  ?  ? ?Transfers ?  ?  ?  ?  ?  ?  ?  ?  ?  ?General transfer comment: deferred ?  ? ?  ?Balance   ?  ?  ?  ?  ?  ?  ?  ?  ?  ?  ?  ?  ?  ?  ?  ?  ?  ?  ?   ? ?ADL either performed or assessed with clinical judgement  ? ?ADL Overall ADL's : Needs assistance/impaired ?  ?  ?Grooming: Moderate assistance ?  ?Upper Body Bathing: Moderate assistance ?  ?Lower Body Bathing: Maximal assistance ?  ?Upper Body Dressing : Maximal assistance ?  ?Lower Body Dressing: Maximal assistance ?  ?  ?  ?Toileting- Clothing Manipulation and Hygiene: Maximal assistance ?  ?  ?  ?Functional mobility during ADLs: Maximal assistance ?   ? ? ? ?  Vision Baseline Vision/History: 1 Wears glasses ?Patient Visual Report: No change from baseline ?   ?   ?Perception   ?  ?Praxis   ?  ? ?Pertinent Vitals/Pain Pain Assessment ?Pain Assessment: No/denies pain  ? ? ? ?Hand Dominance Right ?  ?Extremity/Trunk Assessment Upper Extremity Assessment ?Upper Extremity Assessment: Generalized weakness ?  ?  ?  ?  ?  ?Communication Communication ?Communication:  No difficulties ?  ?Cognition Arousal/Alertness: Awake/alert ?Behavior During Therapy: [facility] for tasks assessed/performed ?Overall Cognitive Status: Within Functional Limits for tasks assessed ?  ?  ?  ?  ?  ?  ?  ?  ?  ?  ?  ?  ?  ?  ?  ?  ?  ?  ?  ?General Comments    ? ?  ?Exercises  BUE there. Ex strengthening  with yellow theraband for bilateral shoulder flexion, and horizontal abduction. There. Ex was performed to improve UE strength needed for bed mobility, functional mobility, and ADLs.  ?  ?Shoulder Instructions    ? ? ?Home Living Family/patient expects to be discharged to:: Private residence ?Living Arrangements: Spouse/significant other ?Available Help at Discharge: Family;Available PRN/intermittently ?Type of Home: House ?Home Access: Stairs to enter ?  ?Entrance Stairs-Rails: Right ?Home Layout: Multi-level ?  ?  ?Bathroom Shower/Tub: Gaffer;Tub/shower unit;Sponge bathes at baseline ?  ?Bathroom Toilet: Handicapped height ?  ?  ?Home Equipment: Rollator (4 wheels);Wheelchair - manual ?  ?  ?  ? ?  ?Prior Functioning/Environment Prior Level of Function : Needs assist ?  ?  ?  ?  ?  ?  ?Mobility Comments: Progressive weakness over the week. Pt.'s sisters reports the pt. has been mostly bedridden over the past 7-10 datys. ?ADLs Comments: Husband assists with morning care. ?  ? ?  ?  ?OT Problem List: Decreased strength ?  ?   ?OT Treatment/Interventions: Self-care/ADL training;Therapeutic exercise;Patient/family education;DME and/or AE instruction;Therapeutic activities  ?  ?OT Goals(Current goals can be found in the care plan section) Acute Rehab OT Goals ?Patient Stated Goal: To get strionger ?OT Goal Formulation: With patient ?Time For Goal Achievement: 08/19/21 ?Potential to Achieve Goals: Good  ?OT Frequency: Min 2X/week ?  ? ?Co-evaluation   ?  ?  ?  ?  ? ?  ?AM-PAC OT "6 Clicks" Daily Activity     ?Outcome Measure Help from another person eating meals?: None ?Help from another person taking  care of personal grooming?: A Little ?Help from another person toileting, which includes using toliet, bedpan, or urinal?: A Lot ?Help from another person bathing (including washing, rinsing, drying)?: A Lot ?Help from another person to put on and taking off regular upper body clothing?: A Little ?Help from another person to put on and taking off regular lower body clothing?: A Lot ?6 Click Score: 16 ?  ?End of Session   ? ?Activity Tolerance: Patient tolerated treatment well;Patient limited by fatigue ?Patient left:   ? ?OT Visit Diagnosis: Unsteadiness on feet (R26.81);Muscle weakness (generalized) (M62.81)  ?              ?Time: 7915-0569 ?OT Time Calculation (min): 35 min ?Charges:  OT General Charges ?$OT Visit: 1 Visit ?OT Evaluation ?$OT Eval Moderate Complexity: 1 Mod ? ?Harrel Carina, MS, OTR/L  ? ?Harrel Carina ?08/05/2021, 3:26 PM ?

## 2021-08-05 NOTE — TOC Progression Note (Signed)
Transition of Care (TOC) - Progression Note  ? ? ?Patient Details  ?Name: Brittany Benton ?MRN: [id number] ?Date of Birth: 11-22-1955 ? ?Transition of Care (TOC) CM/SW Contact  ?Ermal Brzozowski E Jadd Gasior, LCSW ?Phone Number: ?08/05/2021, 12:30 PM ? ?Clinical Narrative:  [facility], auth approved for [facility]. Reached out to MD and LPN to see if patient is still ready for DC to [facility] today. Per MD patient can DC if she voids.  ? ? ? ?Expected Discharge Plan: [place] ?Barriers to Discharge: Continued Medical Work up ? ?Expected Discharge Plan and Services ?Expected Discharge Plan: [place] ?  ?Discharge Planning Services: CM Consult ?  ?Living arrangements for the past 2 months: [place] ?                ?DME Arranged: N/A ?  ?  ?  ?  ?HH Arranged: NA ?  ?  ?  ?  ? ? ?Social Determinants of Health (SDOH) Interventions ?  ? ?Readmission Risk Interventions ?   ? View : No data to display.  ?  ?  ?  ? ? ?

## 2021-08-05 NOTE — Plan of Care (Signed)
Patient discharged per MD orders at this time.All discharge instructions,education and medications reviewed with the patient.Pt expressed understanding and will comply with dc instructions.follow up appointments was also communicated to the patient.no verbal c/o or any ssx of distress.Pt [facility] to [facility] for PT/OT services per order.report was called to floor nurse Dina Crapp before transport. ?Patient was transported by [facility] personnel on a stretcher. ?

## 2021-08-05 NOTE — Discharge Summary (Signed)
?Physician Discharge Summary ?  ?Patient: Brittany Benton MRN: [id number] DOB: 07-24-1955  ?Admit date:     08/02/2021  ?Discharge date: 08/05/21  ?Discharge Physician: Annita Brod  ? ?PCP: Birdie Sons, MD  ? ?Recommendations at discharge:  ? ?Medication change: Imipramine decreased from 50 mg to 25 mg twice daily ?Medication change: Patient had stopped taking her Requip and Sinemet months ago.  She refused to get back on these medications.  Informally discontinuing them from her medication regimen so they do not show up. ?Patient previously been on semaglutide.  Had stopped this medication due to normal A1c ? ?Discharge Diagnoses: ?Active Problems: ?  Physical deconditioning ?  Parkinson's disease ([place]) ?  Hypokalemia ?  Essential (primary) hypertension ?  AKI (acute kidney injury) ([place]) ?  Acute urinary retention ?  Lactic acidosis ?  GERD without esophagitis ?  Dyslipidemia ?  Hypothyroidism ?  Obstructive sleep apnea ?  Obesity (BMI 30-39.9) ?  Weakness ? ?Principal Problem (Resolved): ?  Dehydration ?Resolved Problems: ?  Hyponatremia ? ?Hospital Course: ?66 year old female with past medical history of Parkinson's disease and obesity who presented to the emergency room on 4/19 with generalized weakness.  Patient related that for the past month, she has had difficulty eating and drinking with worsening weight loss and has gone from being ambulatory to essentially bedbound over the past.  Patient has been off of her Sinemet for Parkinson's for several weeks.  Patient look to be clinically dehydrated and was started on IV fluids.  She was brought in for further evaluation. ? ?Following hospitalization, patient found to be very deconditioned and recommendation is for skilled nursing.  In addition, patient has been having issues with urinary retention.  There were concerns about her nutritional intake.  She was seen by speech and much of her swallowing issues appear to be more functional in terms of mindset  rather than an actual swallowing issue.  Nutrition is following. ? ?Assessment and Plan: ?* Dehydration-resolved as of 08/05/2021 ?- This is secondary to severe anorexia of questionable etiology with subsequent high anion gap metabolic acidosis.  Continue IV fluids. ?There is likely some starvation ketosis going on ? ?Physical deconditioning ?Seen by physical therapy.  Needs skilled nursing ? ?Parkinson's disease ([place]) ?- The patient had previously been on Sinemet.  She also did not want to take Requip.  She has had some concerns about taking these medicines, feeling like side effects are making her worse.  She had stopped taking this medication months ago and will not take it here.  Some of her issues appear to be related to conflict with her neurologist.  She was happy to be offered a referral to a new neurologist. ? ?Hyponatremia-resolved as of 08/05/2021 ?- This is likely hypovolemic due to volume depletion and dehydration. ?Received IV fluids and by day of discharge, up to 135. ? ?Hypokalemia ?- Potassium replaced.  By day of discharge at 3.5. ? ?Acute urinary retention ?Patient had intermittent episodes of this during hospitalization, requiring Foley catheter at 1 point.  Suspect it was secondary to electrolyte abnormalities although from medications, imipramine known to cause this.  I am reluctant to discontinue this medicine altogether.  She normally is on 50 mg twice a day for anxiety.  I am going to change this medication to 25 mg twice daily and this hopefully will help her urinary retention.  She does not have complete urinary retention and is able to void some. ? ?AKI (acute kidney injury) (  HCC) ?Mild.  Creatinine at 1.01 on admission and with IV fluids, at day of discharge at 0.57. ? ?Essential (primary) hypertension ?- We will continue her Toprol-XL. ? ?Lactic acidosis ?Secondary to intravascular volume depletion.  Patient does not have sepsis.  Procalcitonin level normal and no evidence of infection.   Sepsis has been ruled out.  Mild leukocytosis on admission was reactive and without antibiotics, normalized following hydration.  Lactic acid at 2.2 on admission, down to 1.0 following hydration. ? ?GERD without esophagitis ?- We will continue PPI therapy ? ?Dyslipidemia ?- We will continue statin therapy. ? ?Hypothyroidism ?- We will continue Synthroid.  TSH within normal limits ? ?Obesity (BMI 30-39.9) ?Meets criteria with BMI greater than 30 ? ? ? ? ?  ? ? ?Consultants: Psychiatry ?Procedures performed: None ?Disposition: Skilled nursing facility ?Diet recommendation:  ?Discharge Diet Orders (From admission, onward)  ? ?  Start     Ordered  ? 08/05/21 0000  Diet general       ? 08/05/21 1404  ? ?  ?  ? ?  ? ?Patient discharged on a regular diet with encouragement to eat ? ?DISCHARGE MEDICATION: ?Allergies as of 08/05/2021   ? ?   Reactions  ? Codeine Nausea And Vomiting  ? Morphine Hives, Swelling  ? Tape Other (See Comments)  ? blisters ?blisters  ? Tramadol Other (See Comments)  ? Hallucinations  ? ?  ? ?  ?Medication List  ?  ? ?STOP taking these medications   ? ?carbidopa-levodopa 25-100 MG tablet ?Commonly known as: SINEMET IR ?  ?Ozempic (0.25 or 0.5 MG/DOSE) 2 MG/1.5ML Sopn ?Generic drug: Semaglutide(0.25 or 0.'5MG'$ /DOS) ?  ?rOPINIRole 0.25 MG tablet ?Commonly known as: REQUIP ?  ?Semaglutide (1 MG/DOSE) 4 MG/3ML Sopn ?  ? ?  ? ?TAKE these medications   ? ?atorvastatin 20 MG tablet ?Commonly known as: LIPITOR ?TAKE 1 TABLET BY MOUTH EVERY EVENING ?  ?imipramine 25 MG tablet ?Commonly known as: Tofranil ?Take 1 tablet (25 mg total) by mouth 2 (two) times daily. ?What changed:  ?medication strength ?how much to take ?  ?levothyroxine 50 MCG tablet ?Commonly known as: SYNTHROID ?TAKE ONE TABLET BY MOUTH EVERY DAY ?  ?metoprolol succinate 50 MG 24 hr tablet ?Commonly known as: TOPROL-XL ?TAKE ONE TABLET BY MOUTH EVERY DAY WITH OR IMMEDIATELY FOLLOWING A MEAL ?  ?MULTIVITAMIN ADULT PO ?Take 1 tablet by mouth  daily. ?  ?omeprazole 20 MG capsule ?Commonly known as: PRILOSEC ?TAKE 1 CAPSULE BY MOUTH ONCE DAILY ?  ?ondansetron 4 MG tablet ?Commonly known as: Zofran ?Take 1 tablet (4 mg total) by mouth every 8 (eight) hours as needed for up to 10 doses for nausea or vomiting. ?  ?sertraline 50 MG tablet ?Commonly known as: ZOLOFT ?Take 1 tablet (50 mg total) by mouth daily. ?  ?Vitamin D-3 25 MCG (1000 UT) Caps ?Take 1 capsule by mouth daily. ?  ? ?  ? ? ?Discharge Exam: ?There were no vitals filed for this visit. ?General: Alert and oriented x3, no acute distress ?Cardiovascular: Regular rate and rhythm, S1-S2 ?Lungs: Clear to auscultation bilaterally ? ?Condition at discharge: good ? ?The results of significant diagnostics from this hospitalization (including imaging, microbiology, ancillary and laboratory) are listed below for reference.  ? ?Imaging Studies: ?DG Chest 1 View ? ?Result Date: 07/28/2021 ?CLINICAL DATA:  Weakness.  Failure to thrive. EXAM: CHEST  1 VIEW COMPARISON:  06/23/2015 FINDINGS: The cardiomediastinal contours are normal. The lungs are clear. Pulmonary vasculature  is normal. No consolidation, pleural effusion, or pneumothorax. No acute osseous abnormalities are seen. IMPRESSION: No acute chest findings. Electronically Signed   By: Keith Rake M.D.   On: 07/28/2021 16:15  ? ?DG Abd 2 Views ? ?Result Date: 08/03/2021 ?CLINICAL DATA:  Anorexia, nausea, hypoactive bowel sounds EXAM: ABDOMEN - 2 VIEW COMPARISON:  None. FINDINGS: Bowel gas pattern is nonspecific. Small amount of stool is seen in the colon. There is no fecal impaction in the rectosigmoid. Surgical staples are seen in the epigastrium. Surgical clips are seen in the right side abdomen. No abnormal masses or calcifications are seen. Kidneys are partly obscured by bowel contents. There numerous phleboliths in the pelvis. There is previous right hip arthroplasty. Degenerative changes are noted in the lumbar spine. Visualized lower lung fields  are clear. IMPRESSION: Nonspecific bowel gas pattern. Electronically Signed   By: Elmer Picker M.D.   On: 08/03/2021 16:37   ? ?Microbiology: ?Results for orders placed or performed during the hospit

## 2021-08-05 NOTE — TOC Transition Note (Addendum)
Transition of Care (TOC) - CM/SW Discharge Note ? ? ?Patient Details  ?Name: Brittany Benton ?MRN: [id number] ?Date of Birth: [birth year] ? ?Transition of Care (TOC) CM/SW Contact:  ?Kimyah Frein E Elona Yinger, LCSW ?Phone Number: ?08/05/2021, 2:11 PM ? ? ?Clinical Narrative:   Discharge to [facility]  today. Room 711. ?Confirmed with Admissions Worker Tammy. ?Updated MD, RN, and patients spouse. ?Asked RN to call report. ?EMS paperwork completed. ?Will call [facility] when LPN is ready.  ? ?3:25- [facility] arranged. Patient is 2nd on the list. LPN notified.  ? ? ?Final next level of care: [place] ?Barriers to Discharge: Barriers Resolved ? ? ?Patient Goals and CMS Choice ?Patient states their goals for this hospitalization and ongoing recovery are:: SNF ?CMS Medicare.gov Compare Post Acute Care list provided to:: Patient Represenative (must comment) ?Choice offered to / list presented to : Spouse, Sibling, Patient ? ?Discharge Placement ?  ?           ?Patient chooses bed at: [facility] ?Patient to be transferred to facility by: [facility] ?Name of family member notified: Greer Pickerel ?Patient and family notified of of transfer: 08/05/21 ? ?Discharge Plan and Services ?  ?Discharge Planning Services: CM Consult ?           ?DME Arranged: N/A ?  ?  ?  ?  ?HH Arranged: NA ?  ?  ?  ?  ? ?Social Determinants of Health (SDOH) Interventions ?  ? ? ?Readmission Risk Interventions ?   ? View : No data to display.  ?  ?  ?  ? ? ? ? ? ?

## 2021-08-06 ENCOUNTER — Other Ambulatory Visit: Payer: Self-pay

## 2021-08-06 ENCOUNTER — Emergency Department
Admission: EM | Admit: 2021-08-06 | Discharge: 2021-08-06 | Disposition: A | Discharge status: Home / Self Care | Payer: Medicare Other | Attending: Emergency Medicine | Admitting: Emergency Medicine

## 2021-08-06 ENCOUNTER — Emergency Department: Payer: Medicare Other

## 2021-08-06 DIAGNOSIS — E871 Hypo-osmolality and hyponatremia: Secondary | ICD-10-CM | POA: Diagnosis not present

## 2021-08-06 DIAGNOSIS — R339 Retention of urine, unspecified: Secondary | ICD-10-CM

## 2021-08-06 DIAGNOSIS — R29898 Other symptoms and signs involving the musculoskeletal system: Secondary | ICD-10-CM | POA: Diagnosis not present

## 2021-08-06 DIAGNOSIS — M6281 Muscle weakness (generalized): Secondary | ICD-10-CM | POA: Diagnosis not present

## 2021-08-06 DIAGNOSIS — E039 Hypothyroidism, unspecified: Secondary | ICD-10-CM | POA: Insufficient documentation

## 2021-08-06 DIAGNOSIS — R7989 Other specified abnormal findings of blood chemistry: Secondary | ICD-10-CM

## 2021-08-06 DIAGNOSIS — E876 Hypokalemia: Secondary | ICD-10-CM | POA: Insufficient documentation

## 2021-08-06 DIAGNOSIS — R9389 Abnormal findings on diagnostic imaging of other specified body structures: Secondary | ICD-10-CM | POA: Insufficient documentation

## 2021-08-06 DIAGNOSIS — R600 Localized edema: Secondary | ICD-10-CM | POA: Diagnosis not present

## 2021-08-06 DIAGNOSIS — M4316 Spondylolisthesis, lumbar region: Secondary | ICD-10-CM | POA: Diagnosis not present

## 2021-08-06 DIAGNOSIS — Z960 Presence of urogenital implants: Secondary | ICD-10-CM | POA: Insufficient documentation

## 2021-08-06 DIAGNOSIS — R9431 Abnormal electrocardiogram [ECG] [EKG]: Secondary | ICD-10-CM | POA: Diagnosis not present

## 2021-08-06 DIAGNOSIS — Z978 Presence of other specified devices: Secondary | ICD-10-CM

## 2021-08-06 DIAGNOSIS — N390 Urinary tract infection, site not specified: Secondary | ICD-10-CM | POA: Diagnosis not present

## 2021-08-06 LAB — URINALYSIS, ROUTINE W REFLEX MICROSCOPIC
Bilirubin Urine: NEGATIVE
Glucose, UA: NEGATIVE mg/dL
Ketones, ur: 20 mg/dL — AB
Nitrite: NEGATIVE
Protein, ur: NEGATIVE mg/dL
Specific Gravity, Urine: 1.005 (ref 1.005–1.030)
WBC, UA: >50 WBC/hpf — ABNORMAL HIGH (ref 0–5)
pH: 6 (ref 5.0–8.0)

## 2021-08-06 LAB — BASIC METABOLIC PANEL
Anion gap: 13 (ref 5–15)
BUN: <5 mg/dL — ABNORMAL LOW (ref 8–23)
CO2: 22 mmol/L (ref 22–32)
Calcium: 8.6 mg/dL — ABNORMAL LOW (ref 8.9–10.3)
Chloride: 95 mmol/L — ABNORMAL LOW (ref 98–111)
Creatinine, Ser: 0.71 mg/dL (ref 0.44–1.00)
GFR, Estimated: >60 mL/min (ref >60)
Glucose, Bld: 67 mg/dL — ABNORMAL LOW (ref 70–99)
Potassium: 2.7 mmol/L — CL (ref 3.5–5.1)
Sodium: 130 mmol/L — ABNORMAL LOW (ref 135–145)

## 2021-08-06 LAB — CBC WITH DIFFERENTIAL/PLATELET
Abs Immature Granulocytes: 0.04 10*3/uL (ref 0.00–0.07)
Basophils Absolute: 0 10*3/uL (ref 0.0–0.1)
Basophils Relative: 1 %
Eosinophils Absolute: 0.1 10*3/uL (ref 0.0–0.5)
Eosinophils Relative: 1 %
HCT: 40.8 % (ref 36.0–46.0)
Hemoglobin: 13.5 g/dL (ref 12.0–15.0)
Immature Granulocytes: 1 %
Lymphocytes Relative: 19 %
Lymphs Abs: 0.9 10*3/uL (ref 0.7–4.0)
MCH: 25.6 pg — ABNORMAL LOW (ref 26.0–34.0)
MCHC: 33.1 g/dL (ref 30.0–36.0)
MCV: 77.3 fL — ABNORMAL LOW (ref 80.0–100.0)
Monocytes Absolute: 0.7 10*3/uL (ref 0.1–1.0)
Monocytes Relative: 14 %
Neutro Abs: 3.1 10*3/uL (ref 1.7–7.7)
Neutrophils Relative %: 64 %
Platelets: 239 10*3/uL (ref 150–400)
RBC: 5.28 MIL/uL — ABNORMAL HIGH (ref 3.87–5.11)
RDW: 17 % — ABNORMAL HIGH (ref 11.5–15.5)
WBC: 4.8 10*3/uL (ref 4.0–10.5)
nRBC: 0 % (ref 0.0–0.2)

## 2021-08-06 LAB — MAGNESIUM: Magnesium: 1.5 mg/dL — ABNORMAL LOW (ref 1.7–2.4)

## 2021-08-06 LAB — TSH: TSH: 8.097 u[IU]/mL — ABNORMAL HIGH (ref 0.350–4.500)

## 2021-08-06 MED ORDER — POTASSIUM CHLORIDE 10 MEQ/100ML IV SOLN
10.0000 meq | Freq: Once | INTRAVENOUS | Status: AC
Start: 2021-08-06 — End: 2021-08-06
  Administered 2021-08-06: 10 meq via INTRAVENOUS
  Filled 2021-08-06: qty 100

## 2021-08-06 MED ORDER — MAGNESIUM SULFATE 2 GM/50ML IV SOLN
2.0000 g | Freq: Once | INTRAVENOUS | Status: AC
Start: 2021-08-06 — End: 2021-08-06
  Administered 2021-08-06: 2 g via INTRAVENOUS
  Filled 2021-08-06: qty 50

## 2021-08-06 MED ORDER — CEPHALEXIN 500 MG PO CAPS: 500.0000 mg | ORAL_CAPSULE | Freq: Four times a day (QID) | ORAL | 0 refills | Status: AC

## 2021-08-06 MED ORDER — LORAZEPAM 2 MG/ML IJ SOLN
1.0000 mg | Freq: Once | INTRAMUSCULAR | Status: AC
  Administered 2021-08-06: 1 mg via INTRAVENOUS
  Filled 2021-08-06: qty 1

## 2021-08-06 MED ORDER — POTASSIUM CHLORIDE 20 MEQ PO PACK
60.0000 meq | PACK | Freq: Every day | ORAL | Status: DC
  Administered 2021-08-06: 60 meq via ORAL
  Filled 2021-08-06: qty 3

## 2021-08-06 MED ORDER — IMIPRAMINE HCL 25 MG PO TABS: 25.0000 mg | ORAL_TABLET | Freq: Every day | ORAL | 0 refills | Status: DC

## 2021-08-06 NOTE — Discharge Instructions (Addendum)
Patient was unable to void.  We placed a Foley catheter.  She has a UTI.  I will treat the UTI with Keflex 1 pill 4 times a day.  It is possible that the inability to void may be caused by the imipramine.  We have discussed tapering instructions with the nurse at the rehab.  They are copied below. ?Medication consult regarding urinary retention issues most likely from her imipramine 25 mg BID.  Decreased to 25 mg daily for one week, then stop.  Follow up with her outpatient provider.  This provider spoke with her nurse at [facility] who reports she just came to them yesterday after discharging medically from [facility].  Explained the treatment plan and the taper from imipramine.  If she is prescribed imipramine for OCD, her outpatient provider can start Luvox instead on a cross titration, if the provider desires while reducing her Zoloft in the process. ? ?Please note that she will need her levothyroxine monitored as her TSH is elevated.  Possibly this is because she is not taking her pills.  I discussed with her the need to take the pills.  Also MRI of the spine was done which shows a thickened endometrial stripe.  This will need to be monitored as well and GYN consulted.  Additionally her potassium was low.  We have repleted this but it should be checked again tomorrow as well as the sodium which also should be checked tomorrow as it was 130.  Once the UTI is treated urology can be consulted to see if she can have the Foley catheter pulled.  That would be optimal. ?

## 2021-08-06 NOTE — ED Notes (Signed)
D/C and new RX discussed with pt and family, all verbalized understanding.  ? ?Copy of D/C given to pt. NAD noted. Indwelling to be left in place. IV removed. VSS.  ?

## 2021-08-06 NOTE — ED Notes (Signed)
Pt refusing to drink oral K+, new orders for IV K+ to be given  ?

## 2021-08-06 NOTE — ED Notes (Signed)
Lab called to request result on mag that hs been "in process" since 0950 this morning. Lab states they will run know, MD aware.  ?

## 2021-08-06 NOTE — ED Provider Notes (Signed)
? ?[facility] ?Provider Note ? ? ? Event Date/Time  ? First MD Initiated Contact with Patient 08/06/21 [phone]   ?  (approximate) ? ? ?History  ? ?Urinary Retention ? ? ?HPI ? ? ?Brittany Benton is a 66 y.o. female who was just sent to [facility] for [facility].  [facility] was at [facility] and wanted to use the bathroom but they would not let her go until they have a PT evaluation tomorrow.  Patient could not pee in the bed apparently came in here and urinary retention with over 900 cc in the bladder.  She apparently is having a lot of weakness and cannot even pick her legs up off the bed.  She additionally she is having the urinary retention.  This was thought to be due to possibly her imipramine.  That dose was reduced but patient is still unable to urinate.  Additionally patient says she is not diabetic and is only taking the Ozempic for weight loss.  Because of the possibility of the complete inability to urinate being due to the imipramine I have asked psych to arrange for titration off.  I have appended the following note from psych: ?Medication consult regarding urinary retention issues most likely from her imipramine 25 mg BID.  Decreased to 25 mg daily for one week, then stop.  Follow up with her outpatient provider.  This provider spoke with her nurse at [facility] who reports she just came to them yesterday after discharging medically from [facility].  Explained the treatment plan and the taper from imipramine.  If she is prescribed imipramine for OCD, her outpatient provider can start Luvox instead on a cross titration, if the provider desires while reducing her Zoloft in the process. ?  ? ? ?Physical Exam  ? ?Triage Vital Signs: ?ED Triage Vitals  ?Enc Vitals Group  ?   BP 08/06/21 0923 136/72  ?   Pulse Rate 08/06/21 0923 89  ?   Resp 08/06/21 0923 20  ?   Temp 08/06/21 0923 97.8 ?F (36.6 ?C)  ?   Temp Source 08/06/21 0923 Axillary  ?   SpO2 08/06/21 0923 100 %  ?   Weight 08/06/21 0924 196 lb  3.4 oz (89 kg)  ?   Height 08/06/21 0924 '5\' 3"'$  (1.6 m)  ?   Head Circumference --   ?   [facility] Flow --   ?   Pain Score 08/06/21 0923 9  ?   Pain Loc --   ?   Pain Edu? --   ?   Excl. in [place]? --   ? ? ?Most recent vital signs: ?Vitals:  ? 08/06/21 1100 08/06/21 1430  ?BP: 139/74 126/67  ?Pulse: 75 75  ?Resp: 17 17  ?Temp:    ?SpO2: 100% 100%  ? ? ? ?General: Awake, no distress. ?CV:  Good peripheral perfusion.  Heart regular rate and rhythm no audible murmurs ?Resp:  Normal effort.  ?Abd:  No distention.  she does have some suprapubic tenderness possibly from a full bladder ?Extremities: Some edema is present.  Patient's legs are weak she cannot pick them up off the bed. ? ? ?ED Results / Procedures / Treatments  ? ?Labs ?(all labs ordered are listed, but only abnormal results are displayed) ?Labs Reviewed  ?BASIC METABOLIC PANEL - Abnormal; Notable for the following components:  ?    Result Value  ? Sodium 130 (*)   ? Potassium 2.7 (*)   ? Chloride 95 (*)   ?  Glucose, Bld 67 (*)   ? BUN <5 (*)   ? Calcium 8.6 (*)   ? All other components within normal limits  ?CBC WITH DIFFERENTIAL/PLATELET - Abnormal; Notable for the following components:  ? RBC 5.28 (*)   ? MCV 77.3 (*)   ? MCH 25.6 (*)   ? RDW 17.0 (*)   ? All other components within normal limits  ?URINALYSIS, ROUTINE W REFLEX MICROSCOPIC - Abnormal; Notable for the following components:  ? Color, Urine YELLOW (*)   ? APPearance CLOUDY (*)   ? Hgb urine dipstick MODERATE (*)   ? Ketones, ur 20 (*)   ? Leukocytes,Ua LARGE (*)   ? WBC, UA >50 (*)   ? Bacteria, UA RARE (*)   ? All other components within normal limits  ?TSH - Abnormal; Notable for the following components:  ? TSH 8.097 (*)   ? All other components within normal limits  ?URINE CULTURE  ?MAGNESIUM  ? ? ? ?EKG ? ?EKG read and interpreted by me appears to show sinus rhythm at 88 rightward axis irregular baseline.  The computer is reading a flutter with a 3-1 block not really saying  that. ? ? ?RADIOLOGY ?MRI read and interpreted by radiology and films reviewed by me does not show any impingement on the spinal nerves.  Radiologist notes that the endometrial stripe is thickened. ? ? ?PROCEDURES: ? ?Critical Care performed:  ? ?Procedures ? ? ?MEDICATIONS ORDERED IN ED: ?Medications  ?potassium chloride (KLOR-CON) packet 60 mEq (60 mEq Oral Given 08/06/21 1241)  ?potassium chloride 10 mEq in 100 mL IVPB (has no administration in time range)  ?LORazepam (ATIVAN) injection 1 mg (1 mg Intravenous Given 08/06/21 1128)  ? ? ? ?IMPRESSION / MDM / ASSESSMENT AND PLAN / ED COURSE  ?I reviewed the triage vital signs and the nursing notes. ?I have spent a good deal of time speaking with the patient's 3 family members.  I explained to them the fact that her TSH is elevated showing she is not getting enough thyroid.  They told me she is not taking her thyroid medication.  She puts it in her mouth and then thinks about that she cannot swallow it.  They assure me she has had a recent swallowing study which was normal.  Patient herself agrees to do her best to swallow the thyroid medication.  I discussed with them the fact that the patient has a UTI and I will give her antibiotics (Keflex) which are pills which she will have to take.  Once the UTI is treated we will see if she can urinate if we pull out the catheter.  Additionally she will need to follow-up with GYN for the thickened endometrial stripe.  This has to be done fairly soon but is not an emergency and can wait for a month or so. ? ?I anticipate being able to discharge the patient once potassium and magnesium are in.  I have also discussed with him the fact that she should have her electrolytes checked again tomorrow.  All of these instructions were also included in the discharge information. ? ?The patient is on the cardiac monitor to evaluate for evidence of arrhythmia and/or significant heart rate changes. ? ?  ? ? ?FINAL CLINICAL IMPRESSION(S) / ED  DIAGNOSES  ? ?Final diagnoses:  ?Urinary retention  ?Foley catheter in place  ?Elevated TSH  ?Urinary tract infection with hematuria, site unspecified  ?Thickened endometrium  ?Hypokalemia  ?Hyponatremia  ? ? ? ?Rx /  DC Orders  ? ?ED Discharge Orders   ? ?      Ordered  ?  imipramine (TOFRANIL) 25 MG tablet  Daily       ? 08/06/21 0954  ?  cephALEXin (KEFLEX) 500 MG capsule  4 times daily       ? 08/06/21 1250  ? ?  ?  ? ?  ? ? ? ?Note:  This document was prepared using Dragon voice recognition software and may include unintentional dictation errors. ?  ?Nena Polio, MD ?08/06/21 1718 ? ?

## 2021-08-06 NOTE — ED Notes (Signed)
Pt given '1mg'$  of ativan for MRI, verbal order from Dr. Cinda Quest ?

## 2021-08-06 NOTE — Consult Note (Addendum)
Medication consult regarding urinary retention issues most likely from her imipramine 25 mg BID.  Decreased to 25 mg daily for one week, then stop.  Follow up with her outpatient provider.  This provider spoke with her nurse at [facility] who reports she just came to them yesterday after discharging medically from [facility].  Explained the treatment plan and the taper from imipramine.  If she is prescribed imipramine for OCD, her outpatient provider can start Luvox instead on a cross titration, if the provider desires while reducing her Zoloft in the process. ? ?Waylan Boga, PMHNP ?

## 2021-08-06 NOTE — ED Triage Notes (Signed)
Pt presents to ED with c/o of urinary retention. EMS states pt was sent to [facility] yesterday for rehab for multiple falls.  ? ?EMS states pt requested to use restroom at [facility] and was told they preferred she use a bedpan until a PT eval and EMS reports staff states refused bed pan and refused to void. Pt states she is now having lower ABD pain. Pt is A&Ox4. VSS.  ?

## 2021-08-06 NOTE — ED Notes (Signed)
Bladder scan 915 ml ?  ?

## 2021-08-06 NOTE — ED Notes (Signed)
D/C papers provided to transport team. ?

## 2021-08-06 NOTE — ED Notes (Signed)
800 ml of urine emptied from indwelling cathter  ?

## 2021-08-06 NOTE — ED Notes (Signed)
Called [facility] for transport to [place] ?

## 2021-08-07 ENCOUNTER — Encounter: Payer: Medicare Other | Admitting: Physical Therapy

## 2021-08-07 DIAGNOSIS — R251 Tremor, unspecified: Secondary | ICD-10-CM | POA: Diagnosis not present

## 2021-08-07 DIAGNOSIS — N390 Urinary tract infection, site not specified: Secondary | ICD-10-CM | POA: Diagnosis not present

## 2021-08-07 DIAGNOSIS — R339 Retention of urine, unspecified: Secondary | ICD-10-CM | POA: Diagnosis not present

## 2021-08-07 DIAGNOSIS — M6281 Muscle weakness (generalized): Secondary | ICD-10-CM | POA: Diagnosis not present

## 2021-08-07 LAB — ZINC: Zinc: 64 ug/dL (ref 44–115)

## 2021-08-07 LAB — COPPER, SERUM: Copper: 107 ug/dL (ref 80–158)

## 2021-08-08 DIAGNOSIS — N390 Urinary tract infection, site not specified: Secondary | ICD-10-CM | POA: Diagnosis not present

## 2021-08-08 DIAGNOSIS — R251 Tremor, unspecified: Secondary | ICD-10-CM | POA: Diagnosis not present

## 2021-08-08 DIAGNOSIS — M6281 Muscle weakness (generalized): Secondary | ICD-10-CM | POA: Diagnosis not present

## 2021-08-08 LAB — VITAMIN E
Vitamin E (Alpha Tocopherol): 14.1 mg/L (ref 9.0–29.0)
Vitamin E(Gamma Tocopherol): 0.9 mg/L (ref 0.5–4.9)

## 2021-08-09 ENCOUNTER — Encounter: Payer: Medicare Other | Admitting: Physical Therapy

## 2021-08-09 LAB — VITAMIN K1, SERUM: VITAMIN K1: 0.28 ng/mL (ref 0.10–2.20)

## 2021-08-09 LAB — URINE CULTURE: Culture: >=100000 — AB

## 2021-08-10 DIAGNOSIS — E876 Hypokalemia: Secondary | ICD-10-CM | POA: Diagnosis not present

## 2021-08-10 DIAGNOSIS — E785 Hyperlipidemia, unspecified: Secondary | ICD-10-CM | POA: Diagnosis not present

## 2021-08-10 DIAGNOSIS — E43 Unspecified severe protein-calorie malnutrition: Secondary | ICD-10-CM | POA: Diagnosis not present

## 2021-08-10 LAB — VITAMIN B1: Vitamin B1 (Thiamine): 52.7 nmol/L — ABNORMAL LOW (ref 66.5–200.0)

## 2021-08-11 ENCOUNTER — Ambulatory Visit: Admission: RE | Admit: 2021-08-11 | Payer: Medicare Other | Source: Ambulatory Visit

## 2021-08-14 DIAGNOSIS — R112 Nausea with vomiting, unspecified: Secondary | ICD-10-CM | POA: Diagnosis not present

## 2021-08-14 DIAGNOSIS — E43 Unspecified severe protein-calorie malnutrition: Secondary | ICD-10-CM | POA: Diagnosis not present

## 2021-08-14 DIAGNOSIS — E876 Hypokalemia: Secondary | ICD-10-CM | POA: Diagnosis not present

## 2021-08-14 LAB — VITAMIN A: Vitamin A (Retinoic Acid): 10.4 ug/dL — ABNORMAL LOW (ref 22.0–69.5)

## 2021-08-15 ENCOUNTER — Encounter: Payer: Self-pay | Admitting: Family Medicine

## 2021-08-15 DIAGNOSIS — E509 Vitamin A deficiency, unspecified: Secondary | ICD-10-CM | POA: Insufficient documentation

## 2021-08-16 ENCOUNTER — Encounter: Payer: Self-pay | Admitting: *Deleted

## 2021-08-17 DIAGNOSIS — R339 Retention of urine, unspecified: Secondary | ICD-10-CM | POA: Diagnosis not present

## 2021-08-17 DIAGNOSIS — G4700 Insomnia, unspecified: Secondary | ICD-10-CM | POA: Diagnosis not present

## 2021-08-22 DIAGNOSIS — R339 Retention of urine, unspecified: Secondary | ICD-10-CM | POA: Diagnosis not present

## 2021-08-22 DIAGNOSIS — G4700 Insomnia, unspecified: Secondary | ICD-10-CM | POA: Diagnosis not present

## 2021-08-25 DIAGNOSIS — E039 Hypothyroidism, unspecified: Secondary | ICD-10-CM | POA: Diagnosis not present

## 2021-08-25 DIAGNOSIS — E43 Unspecified severe protein-calorie malnutrition: Secondary | ICD-10-CM | POA: Diagnosis not present

## 2021-08-25 DIAGNOSIS — R339 Retention of urine, unspecified: Secondary | ICD-10-CM | POA: Diagnosis not present

## 2021-08-28 DIAGNOSIS — Z9181 History of falling: Secondary | ICD-10-CM | POA: Diagnosis not present

## 2021-08-28 DIAGNOSIS — Z9049 Acquired absence of other specified parts of digestive tract: Secondary | ICD-10-CM | POA: Diagnosis not present

## 2021-08-28 DIAGNOSIS — I10 Essential (primary) hypertension: Secondary | ICD-10-CM | POA: Diagnosis not present

## 2021-08-28 DIAGNOSIS — G4733 Obstructive sleep apnea (adult) (pediatric): Secondary | ICD-10-CM | POA: Diagnosis not present

## 2021-08-28 DIAGNOSIS — E785 Hyperlipidemia, unspecified: Secondary | ICD-10-CM | POA: Diagnosis not present

## 2021-08-28 DIAGNOSIS — E559 Vitamin D deficiency, unspecified: Secondary | ICD-10-CM | POA: Diagnosis not present

## 2021-08-28 DIAGNOSIS — E039 Hypothyroidism, unspecified: Secondary | ICD-10-CM | POA: Diagnosis not present

## 2021-08-28 DIAGNOSIS — Z7985 Long-term (current) use of injectable non-insulin antidiabetic drugs: Secondary | ICD-10-CM | POA: Diagnosis not present

## 2021-08-28 DIAGNOSIS — Z96641 Presence of right artificial hip joint: Secondary | ICD-10-CM | POA: Diagnosis not present

## 2021-08-28 DIAGNOSIS — K219 Gastro-esophageal reflux disease without esophagitis: Secondary | ICD-10-CM | POA: Diagnosis not present

## 2021-08-28 DIAGNOSIS — Z85528 Personal history of other malignant neoplasm of kidney: Secondary | ICD-10-CM | POA: Diagnosis not present

## 2021-08-28 DIAGNOSIS — Z87440 Personal history of urinary (tract) infections: Secondary | ICD-10-CM | POA: Diagnosis not present

## 2021-08-28 DIAGNOSIS — M1990 Unspecified osteoarthritis, unspecified site: Secondary | ICD-10-CM | POA: Diagnosis not present

## 2021-08-28 DIAGNOSIS — G20 Parkinson's disease: Secondary | ICD-10-CM | POA: Diagnosis not present

## 2021-08-28 DIAGNOSIS — G2581 Restless legs syndrome: Secondary | ICD-10-CM | POA: Diagnosis not present

## 2021-08-29 DIAGNOSIS — M1990 Unspecified osteoarthritis, unspecified site: Secondary | ICD-10-CM | POA: Diagnosis not present

## 2021-08-29 DIAGNOSIS — Z85528 Personal history of other malignant neoplasm of kidney: Secondary | ICD-10-CM | POA: Diagnosis not present

## 2021-08-29 DIAGNOSIS — Z7985 Long-term (current) use of injectable non-insulin antidiabetic drugs: Secondary | ICD-10-CM | POA: Diagnosis not present

## 2021-08-29 DIAGNOSIS — E039 Hypothyroidism, unspecified: Secondary | ICD-10-CM | POA: Diagnosis not present

## 2021-08-29 DIAGNOSIS — Z9049 Acquired absence of other specified parts of digestive tract: Secondary | ICD-10-CM | POA: Diagnosis not present

## 2021-08-29 DIAGNOSIS — G2581 Restless legs syndrome: Secondary | ICD-10-CM | POA: Diagnosis not present

## 2021-08-29 DIAGNOSIS — Z87440 Personal history of urinary (tract) infections: Secondary | ICD-10-CM | POA: Diagnosis not present

## 2021-08-29 DIAGNOSIS — K219 Gastro-esophageal reflux disease without esophagitis: Secondary | ICD-10-CM | POA: Diagnosis not present

## 2021-08-29 DIAGNOSIS — I10 Essential (primary) hypertension: Secondary | ICD-10-CM | POA: Diagnosis not present

## 2021-08-29 DIAGNOSIS — E559 Vitamin D deficiency, unspecified: Secondary | ICD-10-CM | POA: Diagnosis not present

## 2021-08-29 DIAGNOSIS — E785 Hyperlipidemia, unspecified: Secondary | ICD-10-CM | POA: Diagnosis not present

## 2021-08-29 DIAGNOSIS — Z9181 History of falling: Secondary | ICD-10-CM | POA: Diagnosis not present

## 2021-08-29 DIAGNOSIS — Z96641 Presence of right artificial hip joint: Secondary | ICD-10-CM | POA: Diagnosis not present

## 2021-08-29 DIAGNOSIS — G20 Parkinson's disease: Secondary | ICD-10-CM | POA: Diagnosis not present

## 2021-08-29 DIAGNOSIS — G4733 Obstructive sleep apnea (adult) (pediatric): Secondary | ICD-10-CM | POA: Diagnosis not present

## 2021-09-05 ENCOUNTER — Encounter: Payer: Self-pay | Admitting: Family Medicine

## 2021-09-05 ENCOUNTER — Ambulatory Visit (INDEPENDENT_AMBULATORY_CARE_PROVIDER_SITE_OTHER): Payer: Medicare Other | Admitting: Family Medicine

## 2021-09-05 VITALS — BP 108/56 | HR 81 | Temp 98.6°F | Resp 14

## 2021-09-05 DIAGNOSIS — G4733 Obstructive sleep apnea (adult) (pediatric): Secondary | ICD-10-CM | POA: Diagnosis not present

## 2021-09-05 DIAGNOSIS — D329 Benign neoplasm of meninges, unspecified: Secondary | ICD-10-CM

## 2021-09-05 DIAGNOSIS — E559 Vitamin D deficiency, unspecified: Secondary | ICD-10-CM

## 2021-09-05 DIAGNOSIS — E785 Hyperlipidemia, unspecified: Secondary | ICD-10-CM | POA: Diagnosis not present

## 2021-09-05 DIAGNOSIS — I10 Essential (primary) hypertension: Secondary | ICD-10-CM | POA: Diagnosis not present

## 2021-09-05 DIAGNOSIS — R251 Tremor, unspecified: Secondary | ICD-10-CM | POA: Diagnosis not present

## 2021-09-05 DIAGNOSIS — M1990 Unspecified osteoarthritis, unspecified site: Secondary | ICD-10-CM | POA: Diagnosis not present

## 2021-09-05 DIAGNOSIS — E440 Moderate protein-calorie malnutrition: Secondary | ICD-10-CM

## 2021-09-05 DIAGNOSIS — K219 Gastro-esophageal reflux disease without esophagitis: Secondary | ICD-10-CM | POA: Diagnosis not present

## 2021-09-05 DIAGNOSIS — Z85528 Personal history of other malignant neoplasm of kidney: Secondary | ICD-10-CM | POA: Diagnosis not present

## 2021-09-05 DIAGNOSIS — G20 Parkinson's disease: Secondary | ICD-10-CM | POA: Diagnosis not present

## 2021-09-05 DIAGNOSIS — G2581 Restless legs syndrome: Secondary | ICD-10-CM | POA: Diagnosis not present

## 2021-09-05 DIAGNOSIS — Z87440 Personal history of urinary (tract) infections: Secondary | ICD-10-CM | POA: Diagnosis not present

## 2021-09-05 DIAGNOSIS — Z9181 History of falling: Secondary | ICD-10-CM | POA: Diagnosis not present

## 2021-09-05 DIAGNOSIS — E039 Hypothyroidism, unspecified: Secondary | ICD-10-CM | POA: Diagnosis not present

## 2021-09-05 DIAGNOSIS — Z9049 Acquired absence of other specified parts of digestive tract: Secondary | ICD-10-CM | POA: Diagnosis not present

## 2021-09-05 DIAGNOSIS — Z7985 Long-term (current) use of injectable non-insulin antidiabetic drugs: Secondary | ICD-10-CM | POA: Diagnosis not present

## 2021-09-05 DIAGNOSIS — E46 Unspecified protein-calorie malnutrition: Secondary | ICD-10-CM | POA: Diagnosis not present

## 2021-09-05 DIAGNOSIS — Z96641 Presence of right artificial hip joint: Secondary | ICD-10-CM | POA: Diagnosis not present

## 2021-09-05 LAB — POCT URINALYSIS DIPSTICK
Bilirubin, UA: NEGATIVE
Blood, UA: NEGATIVE
Glucose, UA: NEGATIVE
Ketones, UA: NEGATIVE
Leukocytes, UA: NEGATIVE
Nitrite, UA: NEGATIVE
Protein, UA: NEGATIVE
Spec Grav, UA: 1.015 (ref 1.010–1.025)
Urobilinogen, UA: 0.2 E.U./dL
pH, UA: 5 (ref 5.0–8.0)

## 2021-09-05 MED ORDER — IMIPRAMINE HCL 25 MG PO TABS: 25.0000 mg | ORAL_TABLET | Freq: Every evening | ORAL | Status: DC

## 2021-09-05 NOTE — Progress Notes (Unsigned)
I,Jana Robinson,acting as a scribe for Lelon Huh, MD.,have documented all relevant documentation on the behalf of Lelon Huh, MD,as directed by  Lelon Huh, MD while in the presence of Lelon Huh, MD.   Established patient visit   Patient: Brittany Benton   DOB: March 17, 1956   66 y.o. Female  MRN: [id number] Visit Date: 09/05/2021  Today's healthcare provider: Lelon Huh, MD   No chief complaint on file.  Subjective  Patient states she is here for follow up from hospital and rehab.  She was hospitalized 08/02/2021 through 08/06/2021 for weakens and treated for dehydration,  urinary retention, hyponatremia, and AKI. Her imipramine was reduced to '25mg'$  BID and subsequently discontinued completely. She was discharged to SNF ([facility]) for rehab. although returned to ED day of discharge with UTI treated with cephalexin. Plan at that time was to wean off of imipramine due to urinary retention. Apparently also discussed transition from sertraline to Luvox due to OCD, however she is now back on '75mg'$  sertraline every day and feels it is working well.   She been seeing Dr. Manuella Ghazi for suspected Parkinsons disease earlier this year and prescribed Sinemet. She reports many of symptoms started after starting Sinemet, which she has now discontinued.   Urology referral was placed on 4-28 due to urinary retention. Reports no control "over urine and stool." Since being in rehab and "I'm wearing diapers."    Also of note is that she was found to have suspected 1.1 cm meningioma on MRI in December 2022. Seen by Dr. Izora Ribas and planned for follow up MRI but was not done due to recent hospitalization and illnesses.  Medications: Outpatient Medications Prior to Visit  Medication Sig   atorvastatin (LIPITOR) 20 MG tablet TAKE 1 TABLET BY MOUTH EVERY EVENING   levothyroxine (SYNTHROID) 50 MCG tablet TAKE ONE TABLET BY MOUTH EVERY DAY   metoprolol succinate (TOPROL-XL) 50 MG 24 hr tablet TAKE  ONE TABLET BY MOUTH EVERY DAY WITH OR IMMEDIATELY FOLLOWING A MEAL   omeprazole (PRILOSEC) 20 MG capsule TAKE 1 CAPSULE BY MOUTH ONCE DAILY   sertraline (ZOLOFT) 50 MG tablet Take 1 1/2 tablet (75 mg total) by mouth daily.   Cholecalciferol (VITAMIN D-3) 1000 units CAPS Take 1 capsule by mouth daily. (Patient not taking: Reported on 09/05/2021)   Multiple Vitamins-Minerals (MULTIVITAMIN ADULT PO) Take 1 tablet by mouth daily. (Patient not taking: Reported on 09/05/2021)   ondansetron (ZOFRAN) 4 MG tablet Take 1 tablet (4 mg total) by mouth every 8 (eight) hours as needed for up to 10 doses for nausea or vomiting. (Patient not taking: Reported on 09/05/2021)   No facility-administered medications prior to visit.        Objective    BP (!) 108/56 (BP Location: Right Arm, Patient Position: Sitting, Cuff Size: Normal)   Pulse 81   Temp 98.6 F (37 C) (Oral)   Resp 14   SpO2 93%    Physical Exam   General: Appearance:    Well developed, well nourished female in no acute distress. A bit week, but able to stand and walk short distances without assistance.   Eyes:    PERRL, conjunctiva/corneas clear, EOM's intact       Lungs:     Clear to auscultation bilaterally, respirations unlabored  Heart:    Normal heart rate. Normal rhythm. No murmurs, rubs, or gallops.    MS:   All extremities are intact.    Neurologic:   Awake, alert, oriented  x 3. No apparent focal neurological defect. Persistent resting tremor of left arm, occasional in right.         Assessment & Plan     1. Moderate protein-calorie malnutrition ([place]) Unclear etiology , but has rapid decline after titrating up dose of Sinemet initially prescribed for suspected Parkinson's disease in March. Now off of Sinemet since hospitalized in   2. Tremors of nervous system Suspected Parkinson's per Dr. Manuella Ghazi. However did very poorly with uptitration of Sinemet. She and her husband and going to consider another neurology evaluation for a  second opinion.   3. Essential (primary) hypertension Well controlled.  Continue current medications.   - Comprehensive metabolic panel - Magnesium - CBC  4. Vitamin D deficiency  - VITAMIN D 25 Hydroxy (Vit-D Deficiency, Fractures)  5. Urinary incontinence.  She was having trouble urinary retention but since stopping imipramine she has not  been able to control her bladder. She had been on much higher dose of imipramine for many years. Will likely need referral urology referral, but will first try starting back on low dose of imipramine (TOFRANIL) 25 MG tablet; Take 1 tablet (25 mg total) by mouth at bedtime.   6 . Meningioma Per Dr. Izora Ribas visit in January was not thought to be related to her neurological symptoms. Had planned on repeat MRI in April or May which has been delayed due to recent hospitalization. Will plan on reordering after recovering another 3-6 weeks.   Addressed extensive list of chronic and acute medical problems today requiring 45 minutes reviewing her medical record, counseling patient regarding her conditions and coordination of care.        The entirety of the information documented in the History of Present Illness, Review of Systems and Physical Exam were personally obtained by me. Portions of this information were initially documented by the CMA and reviewed by me for thoroughness and accuracy.     Lelon Huh, MD  [facility] [phone] (phone) [phone] (fax)  [place]

## 2021-09-05 NOTE — Patient Instructions (Signed)
Please review the attached list of medications and notify my office if there are any errors.   Check with your insurance company to see if you can see a neurologist at [facility] or [facility] in [place].

## 2021-09-06 LAB — COMPREHENSIVE METABOLIC PANEL
ALT: 10 IU/L (ref 0–32)
AST: 24 IU/L (ref 0–40)
Albumin/Globulin Ratio: 1.5 (ref 1.2–2.2)
Albumin: 3.5 g/dL — ABNORMAL LOW (ref 3.8–4.8)
Alkaline Phosphatase: 90 IU/L (ref 44–121)
BUN/Creatinine Ratio: 10 — ABNORMAL LOW (ref 12–28)
BUN: 8 mg/dL (ref 8–27)
Bilirubin Total: 0.4 mg/dL (ref 0.0–1.2)
CO2: 20 mmol/L (ref 20–29)
Calcium: 8.8 mg/dL (ref 8.7–10.3)
Chloride: 108 mmol/L — ABNORMAL HIGH (ref 96–106)
Creatinine, Ser: 0.82 mg/dL (ref 0.57–1.00)
Globulin, Total: 2.3 g/dL (ref 1.5–4.5)
Glucose: 98 mg/dL (ref 70–99)
Potassium: 4.1 mmol/L (ref 3.5–5.2)
Sodium: 143 mmol/L (ref 134–144)
Total Protein: 5.8 g/dL — ABNORMAL LOW (ref 6.0–8.5)
eGFR: 78 mL/min/{1.73_m2} (ref >59)

## 2021-09-06 LAB — CBC
Hematocrit: 39.8 % (ref 34.0–46.6)
Hemoglobin: 13 g/dL (ref 11.1–15.9)
MCH: 27.1 pg (ref 26.6–33.0)
MCHC: 32.7 g/dL (ref 31.5–35.7)
MCV: 83 fL (ref 79–97)
Platelets: 309 10*3/uL (ref 150–450)
RBC: 4.8 x10E6/uL (ref 3.77–5.28)
RDW: 17 % — ABNORMAL HIGH (ref 11.7–15.4)
WBC: 5.9 10*3/uL (ref 3.4–10.8)

## 2021-09-06 LAB — VITAMIN D 25 HYDROXY (VIT D DEFICIENCY, FRACTURES): Vit D, 25-Hydroxy: 41.2 ng/mL (ref 30.0–100.0)

## 2021-09-06 LAB — MAGNESIUM: Magnesium: 2 mg/dL (ref 1.6–2.3)

## 2021-09-08 DIAGNOSIS — G20 Parkinson's disease: Secondary | ICD-10-CM | POA: Diagnosis not present

## 2021-09-08 DIAGNOSIS — Z96641 Presence of right artificial hip joint: Secondary | ICD-10-CM | POA: Diagnosis not present

## 2021-09-08 DIAGNOSIS — Z7985 Long-term (current) use of injectable non-insulin antidiabetic drugs: Secondary | ICD-10-CM | POA: Diagnosis not present

## 2021-09-08 DIAGNOSIS — M1990 Unspecified osteoarthritis, unspecified site: Secondary | ICD-10-CM | POA: Diagnosis not present

## 2021-09-08 DIAGNOSIS — Z9181 History of falling: Secondary | ICD-10-CM | POA: Diagnosis not present

## 2021-09-08 DIAGNOSIS — E559 Vitamin D deficiency, unspecified: Secondary | ICD-10-CM | POA: Diagnosis not present

## 2021-09-08 DIAGNOSIS — E039 Hypothyroidism, unspecified: Secondary | ICD-10-CM | POA: Diagnosis not present

## 2021-09-08 DIAGNOSIS — Z87440 Personal history of urinary (tract) infections: Secondary | ICD-10-CM | POA: Diagnosis not present

## 2021-09-08 DIAGNOSIS — K219 Gastro-esophageal reflux disease without esophagitis: Secondary | ICD-10-CM | POA: Diagnosis not present

## 2021-09-08 DIAGNOSIS — Z85528 Personal history of other malignant neoplasm of kidney: Secondary | ICD-10-CM | POA: Diagnosis not present

## 2021-09-08 DIAGNOSIS — G2581 Restless legs syndrome: Secondary | ICD-10-CM | POA: Diagnosis not present

## 2021-09-08 DIAGNOSIS — G4733 Obstructive sleep apnea (adult) (pediatric): Secondary | ICD-10-CM | POA: Diagnosis not present

## 2021-09-08 DIAGNOSIS — I10 Essential (primary) hypertension: Secondary | ICD-10-CM | POA: Diagnosis not present

## 2021-09-08 DIAGNOSIS — Z9049 Acquired absence of other specified parts of digestive tract: Secondary | ICD-10-CM | POA: Diagnosis not present

## 2021-09-08 DIAGNOSIS — E785 Hyperlipidemia, unspecified: Secondary | ICD-10-CM | POA: Diagnosis not present

## 2021-09-11 DIAGNOSIS — Z9181 History of falling: Secondary | ICD-10-CM | POA: Diagnosis not present

## 2021-09-11 DIAGNOSIS — I10 Essential (primary) hypertension: Secondary | ICD-10-CM | POA: Diagnosis not present

## 2021-09-11 DIAGNOSIS — M1990 Unspecified osteoarthritis, unspecified site: Secondary | ICD-10-CM | POA: Diagnosis not present

## 2021-09-11 DIAGNOSIS — E785 Hyperlipidemia, unspecified: Secondary | ICD-10-CM | POA: Diagnosis not present

## 2021-09-11 DIAGNOSIS — Z87440 Personal history of urinary (tract) infections: Secondary | ICD-10-CM | POA: Diagnosis not present

## 2021-09-11 DIAGNOSIS — E559 Vitamin D deficiency, unspecified: Secondary | ICD-10-CM | POA: Diagnosis not present

## 2021-09-11 DIAGNOSIS — Z7985 Long-term (current) use of injectable non-insulin antidiabetic drugs: Secondary | ICD-10-CM | POA: Diagnosis not present

## 2021-09-11 DIAGNOSIS — E039 Hypothyroidism, unspecified: Secondary | ICD-10-CM | POA: Diagnosis not present

## 2021-09-11 DIAGNOSIS — Z9049 Acquired absence of other specified parts of digestive tract: Secondary | ICD-10-CM | POA: Diagnosis not present

## 2021-09-11 DIAGNOSIS — Z96641 Presence of right artificial hip joint: Secondary | ICD-10-CM | POA: Diagnosis not present

## 2021-09-11 DIAGNOSIS — G20 Parkinson's disease: Secondary | ICD-10-CM | POA: Diagnosis not present

## 2021-09-11 DIAGNOSIS — G4733 Obstructive sleep apnea (adult) (pediatric): Secondary | ICD-10-CM | POA: Diagnosis not present

## 2021-09-11 DIAGNOSIS — K219 Gastro-esophageal reflux disease without esophagitis: Secondary | ICD-10-CM | POA: Diagnosis not present

## 2021-09-11 DIAGNOSIS — G2581 Restless legs syndrome: Secondary | ICD-10-CM | POA: Diagnosis not present

## 2021-09-11 DIAGNOSIS — Z85528 Personal history of other malignant neoplasm of kidney: Secondary | ICD-10-CM | POA: Diagnosis not present

## 2021-09-12 DIAGNOSIS — M1990 Unspecified osteoarthritis, unspecified site: Secondary | ICD-10-CM | POA: Diagnosis not present

## 2021-09-12 DIAGNOSIS — G4733 Obstructive sleep apnea (adult) (pediatric): Secondary | ICD-10-CM | POA: Diagnosis not present

## 2021-09-12 DIAGNOSIS — E559 Vitamin D deficiency, unspecified: Secondary | ICD-10-CM | POA: Diagnosis not present

## 2021-09-12 DIAGNOSIS — Z7985 Long-term (current) use of injectable non-insulin antidiabetic drugs: Secondary | ICD-10-CM | POA: Diagnosis not present

## 2021-09-12 DIAGNOSIS — Z87440 Personal history of urinary (tract) infections: Secondary | ICD-10-CM | POA: Diagnosis not present

## 2021-09-12 DIAGNOSIS — G20 Parkinson's disease: Secondary | ICD-10-CM | POA: Diagnosis not present

## 2021-09-12 DIAGNOSIS — Z9181 History of falling: Secondary | ICD-10-CM | POA: Diagnosis not present

## 2021-09-12 DIAGNOSIS — G2581 Restless legs syndrome: Secondary | ICD-10-CM | POA: Diagnosis not present

## 2021-09-12 DIAGNOSIS — E785 Hyperlipidemia, unspecified: Secondary | ICD-10-CM | POA: Diagnosis not present

## 2021-09-12 DIAGNOSIS — Z96641 Presence of right artificial hip joint: Secondary | ICD-10-CM | POA: Diagnosis not present

## 2021-09-12 DIAGNOSIS — I10 Essential (primary) hypertension: Secondary | ICD-10-CM | POA: Diagnosis not present

## 2021-09-12 DIAGNOSIS — K219 Gastro-esophageal reflux disease without esophagitis: Secondary | ICD-10-CM | POA: Diagnosis not present

## 2021-09-12 DIAGNOSIS — Z85528 Personal history of other malignant neoplasm of kidney: Secondary | ICD-10-CM | POA: Diagnosis not present

## 2021-09-12 DIAGNOSIS — Z9049 Acquired absence of other specified parts of digestive tract: Secondary | ICD-10-CM | POA: Diagnosis not present

## 2021-09-12 DIAGNOSIS — E039 Hypothyroidism, unspecified: Secondary | ICD-10-CM | POA: Diagnosis not present

## 2021-09-14 DIAGNOSIS — G2581 Restless legs syndrome: Secondary | ICD-10-CM | POA: Diagnosis not present

## 2021-09-14 DIAGNOSIS — I10 Essential (primary) hypertension: Secondary | ICD-10-CM | POA: Diagnosis not present

## 2021-09-14 DIAGNOSIS — E039 Hypothyroidism, unspecified: Secondary | ICD-10-CM | POA: Diagnosis not present

## 2021-09-14 DIAGNOSIS — Z87440 Personal history of urinary (tract) infections: Secondary | ICD-10-CM | POA: Diagnosis not present

## 2021-09-14 DIAGNOSIS — E785 Hyperlipidemia, unspecified: Secondary | ICD-10-CM | POA: Diagnosis not present

## 2021-09-14 DIAGNOSIS — Z9181 History of falling: Secondary | ICD-10-CM | POA: Diagnosis not present

## 2021-09-14 DIAGNOSIS — G4733 Obstructive sleep apnea (adult) (pediatric): Secondary | ICD-10-CM | POA: Diagnosis not present

## 2021-09-14 DIAGNOSIS — G20 Parkinson's disease: Secondary | ICD-10-CM | POA: Diagnosis not present

## 2021-09-14 DIAGNOSIS — E559 Vitamin D deficiency, unspecified: Secondary | ICD-10-CM | POA: Diagnosis not present

## 2021-09-14 DIAGNOSIS — Z85528 Personal history of other malignant neoplasm of kidney: Secondary | ICD-10-CM | POA: Diagnosis not present

## 2021-09-14 DIAGNOSIS — K219 Gastro-esophageal reflux disease without esophagitis: Secondary | ICD-10-CM | POA: Diagnosis not present

## 2021-09-14 DIAGNOSIS — Z96641 Presence of right artificial hip joint: Secondary | ICD-10-CM | POA: Diagnosis not present

## 2021-09-14 DIAGNOSIS — Z7985 Long-term (current) use of injectable non-insulin antidiabetic drugs: Secondary | ICD-10-CM | POA: Diagnosis not present

## 2021-09-14 DIAGNOSIS — Z9049 Acquired absence of other specified parts of digestive tract: Secondary | ICD-10-CM | POA: Diagnosis not present

## 2021-09-14 DIAGNOSIS — M1990 Unspecified osteoarthritis, unspecified site: Secondary | ICD-10-CM | POA: Diagnosis not present

## 2021-09-15 DIAGNOSIS — E785 Hyperlipidemia, unspecified: Secondary | ICD-10-CM | POA: Diagnosis not present

## 2021-09-15 DIAGNOSIS — Z96641 Presence of right artificial hip joint: Secondary | ICD-10-CM | POA: Diagnosis not present

## 2021-09-15 DIAGNOSIS — K219 Gastro-esophageal reflux disease without esophagitis: Secondary | ICD-10-CM | POA: Diagnosis not present

## 2021-09-15 DIAGNOSIS — E039 Hypothyroidism, unspecified: Secondary | ICD-10-CM | POA: Diagnosis not present

## 2021-09-15 DIAGNOSIS — Z9181 History of falling: Secondary | ICD-10-CM | POA: Diagnosis not present

## 2021-09-15 DIAGNOSIS — G2581 Restless legs syndrome: Secondary | ICD-10-CM | POA: Diagnosis not present

## 2021-09-15 DIAGNOSIS — E559 Vitamin D deficiency, unspecified: Secondary | ICD-10-CM | POA: Diagnosis not present

## 2021-09-15 DIAGNOSIS — Z85528 Personal history of other malignant neoplasm of kidney: Secondary | ICD-10-CM | POA: Diagnosis not present

## 2021-09-15 DIAGNOSIS — Z9049 Acquired absence of other specified parts of digestive tract: Secondary | ICD-10-CM | POA: Diagnosis not present

## 2021-09-15 DIAGNOSIS — I10 Essential (primary) hypertension: Secondary | ICD-10-CM | POA: Diagnosis not present

## 2021-09-15 DIAGNOSIS — Z7985 Long-term (current) use of injectable non-insulin antidiabetic drugs: Secondary | ICD-10-CM | POA: Diagnosis not present

## 2021-09-15 DIAGNOSIS — G4733 Obstructive sleep apnea (adult) (pediatric): Secondary | ICD-10-CM | POA: Diagnosis not present

## 2021-09-15 DIAGNOSIS — Z87440 Personal history of urinary (tract) infections: Secondary | ICD-10-CM | POA: Diagnosis not present

## 2021-09-15 DIAGNOSIS — G20 Parkinson's disease: Secondary | ICD-10-CM | POA: Diagnosis not present

## 2021-09-15 DIAGNOSIS — M1990 Unspecified osteoarthritis, unspecified site: Secondary | ICD-10-CM | POA: Diagnosis not present

## 2021-09-19 DIAGNOSIS — Z87440 Personal history of urinary (tract) infections: Secondary | ICD-10-CM | POA: Diagnosis not present

## 2021-09-19 DIAGNOSIS — G2581 Restless legs syndrome: Secondary | ICD-10-CM | POA: Diagnosis not present

## 2021-09-19 DIAGNOSIS — E039 Hypothyroidism, unspecified: Secondary | ICD-10-CM | POA: Diagnosis not present

## 2021-09-19 DIAGNOSIS — K219 Gastro-esophageal reflux disease without esophagitis: Secondary | ICD-10-CM | POA: Diagnosis not present

## 2021-09-19 DIAGNOSIS — Z85528 Personal history of other malignant neoplasm of kidney: Secondary | ICD-10-CM | POA: Diagnosis not present

## 2021-09-19 DIAGNOSIS — Z7985 Long-term (current) use of injectable non-insulin antidiabetic drugs: Secondary | ICD-10-CM | POA: Diagnosis not present

## 2021-09-19 DIAGNOSIS — Z9181 History of falling: Secondary | ICD-10-CM | POA: Diagnosis not present

## 2021-09-19 DIAGNOSIS — Z9049 Acquired absence of other specified parts of digestive tract: Secondary | ICD-10-CM | POA: Diagnosis not present

## 2021-09-19 DIAGNOSIS — G20 Parkinson's disease: Secondary | ICD-10-CM | POA: Diagnosis not present

## 2021-09-19 DIAGNOSIS — Z96641 Presence of right artificial hip joint: Secondary | ICD-10-CM | POA: Diagnosis not present

## 2021-09-19 DIAGNOSIS — G4733 Obstructive sleep apnea (adult) (pediatric): Secondary | ICD-10-CM | POA: Diagnosis not present

## 2021-09-19 DIAGNOSIS — I10 Essential (primary) hypertension: Secondary | ICD-10-CM | POA: Diagnosis not present

## 2021-09-19 DIAGNOSIS — M1990 Unspecified osteoarthritis, unspecified site: Secondary | ICD-10-CM | POA: Diagnosis not present

## 2021-09-19 DIAGNOSIS — E785 Hyperlipidemia, unspecified: Secondary | ICD-10-CM | POA: Diagnosis not present

## 2021-09-19 DIAGNOSIS — E559 Vitamin D deficiency, unspecified: Secondary | ICD-10-CM | POA: Diagnosis not present

## 2021-09-20 DIAGNOSIS — I10 Essential (primary) hypertension: Secondary | ICD-10-CM | POA: Diagnosis not present

## 2021-09-20 DIAGNOSIS — K219 Gastro-esophageal reflux disease without esophagitis: Secondary | ICD-10-CM | POA: Diagnosis not present

## 2021-09-20 DIAGNOSIS — Z9181 History of falling: Secondary | ICD-10-CM | POA: Diagnosis not present

## 2021-09-20 DIAGNOSIS — Z96641 Presence of right artificial hip joint: Secondary | ICD-10-CM | POA: Diagnosis not present

## 2021-09-20 DIAGNOSIS — Z9049 Acquired absence of other specified parts of digestive tract: Secondary | ICD-10-CM | POA: Diagnosis not present

## 2021-09-20 DIAGNOSIS — Z87440 Personal history of urinary (tract) infections: Secondary | ICD-10-CM | POA: Diagnosis not present

## 2021-09-20 DIAGNOSIS — Z85528 Personal history of other malignant neoplasm of kidney: Secondary | ICD-10-CM | POA: Diagnosis not present

## 2021-09-20 DIAGNOSIS — E039 Hypothyroidism, unspecified: Secondary | ICD-10-CM | POA: Diagnosis not present

## 2021-09-20 DIAGNOSIS — Z7985 Long-term (current) use of injectable non-insulin antidiabetic drugs: Secondary | ICD-10-CM | POA: Diagnosis not present

## 2021-09-20 DIAGNOSIS — E785 Hyperlipidemia, unspecified: Secondary | ICD-10-CM | POA: Diagnosis not present

## 2021-09-20 DIAGNOSIS — M1990 Unspecified osteoarthritis, unspecified site: Secondary | ICD-10-CM | POA: Diagnosis not present

## 2021-09-20 DIAGNOSIS — E559 Vitamin D deficiency, unspecified: Secondary | ICD-10-CM | POA: Diagnosis not present

## 2021-09-20 DIAGNOSIS — G4733 Obstructive sleep apnea (adult) (pediatric): Secondary | ICD-10-CM | POA: Diagnosis not present

## 2021-09-20 DIAGNOSIS — G20 Parkinson's disease: Secondary | ICD-10-CM | POA: Diagnosis not present

## 2021-09-20 DIAGNOSIS — G2581 Restless legs syndrome: Secondary | ICD-10-CM | POA: Diagnosis not present

## 2021-09-21 DIAGNOSIS — E785 Hyperlipidemia, unspecified: Secondary | ICD-10-CM | POA: Diagnosis not present

## 2021-09-21 DIAGNOSIS — M1990 Unspecified osteoarthritis, unspecified site: Secondary | ICD-10-CM | POA: Diagnosis not present

## 2021-09-21 DIAGNOSIS — E039 Hypothyroidism, unspecified: Secondary | ICD-10-CM | POA: Diagnosis not present

## 2021-09-21 DIAGNOSIS — Z87440 Personal history of urinary (tract) infections: Secondary | ICD-10-CM | POA: Diagnosis not present

## 2021-09-21 DIAGNOSIS — Z9049 Acquired absence of other specified parts of digestive tract: Secondary | ICD-10-CM | POA: Diagnosis not present

## 2021-09-21 DIAGNOSIS — Z96641 Presence of right artificial hip joint: Secondary | ICD-10-CM | POA: Diagnosis not present

## 2021-09-21 DIAGNOSIS — E559 Vitamin D deficiency, unspecified: Secondary | ICD-10-CM | POA: Diagnosis not present

## 2021-09-21 DIAGNOSIS — G20 Parkinson's disease: Secondary | ICD-10-CM | POA: Diagnosis not present

## 2021-09-21 DIAGNOSIS — G4733 Obstructive sleep apnea (adult) (pediatric): Secondary | ICD-10-CM | POA: Diagnosis not present

## 2021-09-21 DIAGNOSIS — Z9181 History of falling: Secondary | ICD-10-CM | POA: Diagnosis not present

## 2021-09-21 DIAGNOSIS — Z7985 Long-term (current) use of injectable non-insulin antidiabetic drugs: Secondary | ICD-10-CM | POA: Diagnosis not present

## 2021-09-21 DIAGNOSIS — Z85528 Personal history of other malignant neoplasm of kidney: Secondary | ICD-10-CM | POA: Diagnosis not present

## 2021-09-21 DIAGNOSIS — K219 Gastro-esophageal reflux disease without esophagitis: Secondary | ICD-10-CM | POA: Diagnosis not present

## 2021-09-21 DIAGNOSIS — G2581 Restless legs syndrome: Secondary | ICD-10-CM | POA: Diagnosis not present

## 2021-09-21 DIAGNOSIS — I10 Essential (primary) hypertension: Secondary | ICD-10-CM | POA: Diagnosis not present

## 2021-09-26 DIAGNOSIS — Z96641 Presence of right artificial hip joint: Secondary | ICD-10-CM | POA: Diagnosis not present

## 2021-09-26 DIAGNOSIS — G2581 Restless legs syndrome: Secondary | ICD-10-CM | POA: Diagnosis not present

## 2021-09-26 DIAGNOSIS — K219 Gastro-esophageal reflux disease without esophagitis: Secondary | ICD-10-CM | POA: Diagnosis not present

## 2021-09-26 DIAGNOSIS — Z9181 History of falling: Secondary | ICD-10-CM | POA: Diagnosis not present

## 2021-09-26 DIAGNOSIS — Z7985 Long-term (current) use of injectable non-insulin antidiabetic drugs: Secondary | ICD-10-CM | POA: Diagnosis not present

## 2021-09-26 DIAGNOSIS — Z9049 Acquired absence of other specified parts of digestive tract: Secondary | ICD-10-CM | POA: Diagnosis not present

## 2021-09-26 DIAGNOSIS — M1990 Unspecified osteoarthritis, unspecified site: Secondary | ICD-10-CM | POA: Diagnosis not present

## 2021-09-26 DIAGNOSIS — E039 Hypothyroidism, unspecified: Secondary | ICD-10-CM | POA: Diagnosis not present

## 2021-09-26 DIAGNOSIS — E785 Hyperlipidemia, unspecified: Secondary | ICD-10-CM | POA: Diagnosis not present

## 2021-09-26 DIAGNOSIS — E559 Vitamin D deficiency, unspecified: Secondary | ICD-10-CM | POA: Diagnosis not present

## 2021-09-26 DIAGNOSIS — Z87440 Personal history of urinary (tract) infections: Secondary | ICD-10-CM | POA: Diagnosis not present

## 2021-09-26 DIAGNOSIS — I10 Essential (primary) hypertension: Secondary | ICD-10-CM | POA: Diagnosis not present

## 2021-09-26 DIAGNOSIS — Z85528 Personal history of other malignant neoplasm of kidney: Secondary | ICD-10-CM | POA: Diagnosis not present

## 2021-09-26 DIAGNOSIS — G20 Parkinson's disease: Secondary | ICD-10-CM | POA: Diagnosis not present

## 2021-09-26 DIAGNOSIS — G4733 Obstructive sleep apnea (adult) (pediatric): Secondary | ICD-10-CM | POA: Diagnosis not present

## 2021-09-29 LAB — VITAMIN A: Vitamin A: 27.7 ug/dL (ref 22.0–69.5)

## 2021-09-29 LAB — SPECIMEN STATUS REPORT

## 2021-11-17 ENCOUNTER — Ambulatory Visit
Admission: RE | Admit: 2021-11-17 | Discharge: 2021-11-17 | Disposition: A | Discharge status: Home / Self Care | Payer: Medicare Other | Source: Ambulatory Visit | Attending: Neurosurgery | Admitting: Neurosurgery

## 2021-11-17 DIAGNOSIS — D329 Benign neoplasm of meninges, unspecified: Secondary | ICD-10-CM | POA: Insufficient documentation

## 2021-11-17 MED ORDER — GADOBUTROL 1 MMOL/ML IV SOLN
7.5000 mL | Freq: Once | INTRAVENOUS | Status: AC | PRN
  Administered 2021-11-17: 7.5 mL via INTRAVENOUS

## 2021-11-20 ENCOUNTER — Ambulatory Visit: Payer: Medicare Other | Admitting: Family Medicine

## 2021-11-21 ENCOUNTER — Telehealth: Payer: Self-pay

## 2021-11-21 ENCOUNTER — Telehealth: Payer: Self-pay | Admitting: Neurosurgery

## 2021-11-21 DIAGNOSIS — D329 Benign neoplasm of meninges, unspecified: Secondary | ICD-10-CM

## 2021-11-21 NOTE — Telephone Encounter (Signed)
-----   Message from Peggyann Shoals sent at 11/21/2021  2:07 PM EDT ----- Regarding: MRI results Contact: [phone] She had her Brain MRI on 8/4. Can you call her with the results. No rush.

## 2021-11-21 NOTE — Telephone Encounter (Signed)
I spoke with the patient regarding her MRI results.  Her brain MRI is stable and shows what appears to be a small meningioma of approximately 1 cm in size overlying her left temporal lobe.  This is stable on 2 consecutive MRI scans.  I think it is a very low risk that this will change substantially in size over time to the point where it would require resection.  We discussed repeating her MRI scan in 1 to 2 years versus waiting on any additional symptoms to occur before additional imaging. She elected to watch for additional symptoms before any additional imaging.  She will reach out to Dr Maralyn Sago office to reestablish PT.  She expressed some discontent with communication from the neurologist's office.  I reviewed the record of telephone calls, so I am unsure of the breakdown in communication.  I offered to reach out to Dr. Manuella Ghazi, but she declined.

## 2021-11-22 NOTE — Telephone Encounter (Signed)
Dr Izora Ribas spoke with her yesterday

## 2022-01-29 ENCOUNTER — Other Ambulatory Visit: Payer: Self-pay | Admitting: Family Medicine

## 2022-03-29 NOTE — Progress Notes (Signed)
I,Sha'taria Tyson,acting as a Education administrator for Lelon Huh, MD.,have documented all relevant documentation on the behalf of Lelon Huh, MD,as directed by  Lelon Huh, MD while in the presence of Lelon Huh, MD.   Established patient visit   Patient: Brittany Benton   DOB: 1955/04/19   66 y.o. Female  MRN: [id number] Visit Date: 03/30/2022  Today's healthcare provider: Lelon Huh, MD   No chief complaint on file.  Subjective    HPI  -Patient reports having a thyroid issue and would like blood work done to check on issues and would also like iron checked due to always craving ice and states this is new for her. Her mouth is always dry and states she drinks water all the time.  -Reports having frequent urination  -Patient also reports gaining 30 pounds since May and concerned states she does eat that much. Consuming at least one meal a day.Marland Kitchen She was hospitalized in April with FTT, dehydration, hypokalemia, vitamin B1 and vitamin A deficiency.   Hypertension, follow-up  BP Readings from Last 3 Encounters:  09/05/21 (!) 108/56  08/06/21 113/72  08/05/21 117/66   Wt Readings from Last 3 Encounters:  08/06/21 196 lb 3.4 oz (89 kg)  07/28/21 196 lb 3.4 oz (89 kg)  05/08/21 181 lb 3.5 oz (82.2 kg)     She was last seen for hypertension 7 months ago.  Management since that visit includes; Well controlled.  Continue current medications.     Outside blood pressures are not being checked.  Pertinent labs Lab Results  Component Value Date   CHOL 120 08/04/2020   HDL 45 08/04/2020   LDLCALC 55 08/04/2020   TRIG 111 08/04/2020   CHOLHDL 2.7 08/04/2020   Lab Results  Component Value Date   NA 143 09/05/2021   K 4.1 09/05/2021   CREATININE 0.82 09/05/2021   EGFR 78 09/05/2021   GLUCOSE 98 09/05/2021   TSH 8.097 (H) 08/06/2021     The ASCVD Risk score (Arnett DK, et al., 2019) failed to calculate for the following reasons:   The valid total cholesterol range is 130 to  320 mg/dL  ---------------------------------------------------------------------------------------------------   Medications: Outpatient Medications Prior to Visit  Medication Sig   atorvastatin (LIPITOR) 20 MG tablet TAKE 1 TABLET BY MOUTH EVERY EVENING   Cholecalciferol (VITAMIN D-3) 1000 units CAPS Take 1 capsule by mouth daily. (Patient not taking: Reported on 09/05/2021)   imipramine (TOFRANIL) 25 MG tablet Take 1 tablet (25 mg total) by mouth at bedtime.   imipramine (TOFRANIL) 50 MG tablet TAKE ONE TABLET BY MOUTH TWICE DAILY   levothyroxine (SYNTHROID) 50 MCG tablet TAKE ONE TABLET BY MOUTH EVERY DAY   metoprolol succinate (TOPROL-XL) 50 MG 24 hr tablet TAKE ONE TABLET BY MOUTH EVERY DAY WITH OR IMMEDIATELY FOLLOWING A MEAL   Multiple Vitamins-Minerals (MULTIVITAMIN ADULT PO) Take 1 tablet by mouth daily. (Patient not taking: Reported on 09/05/2021)   omeprazole (PRILOSEC) 20 MG capsule TAKE 1 CAPSULE BY MOUTH ONCE DAILY   ondansetron (ZOFRAN) 4 MG tablet Take 1 tablet (4 mg total) by mouth every 8 (eight) hours as needed for up to 10 doses for nausea or vomiting. (Patient not taking: Reported on 09/05/2021)   sertraline (ZOLOFT) 50 MG tablet Take 1 tablet (50 mg total) by mouth daily. (Patient taking differently: Take 75 mg by mouth daily.)   No facility-administered medications prior to visit.    Review of Systems  Constitutional:  Negative for appetite change,  chills, fatigue and fever.  Respiratory:  Negative for chest tightness and shortness of breath.   Cardiovascular:  Negative for chest pain and palpitations.  Gastrointestinal:  Negative for abdominal pain, nausea and vomiting.  Neurological:  Negative for dizziness and weakness.       Objective    BP (!) 122/48 (BP Location: Right Arm, Patient Position: Sitting, Cuff Size: Large)   Pulse 83   Wt 184 lb 12.8 oz (83.8 kg)   SpO2 100%   BMI 32.74 kg/m    Physical Exam   General appearance: Mildly obese female,  cooperative and in no acute distress Head: Normocephalic, without obvious abnormality, atraumatic Respiratory: Respirations even and unlabored, normal respiratory rate Extremities: All extremities are intact.  Skin: Skin color, texture, turgor normal. No rashes seen  Psych: Appropriate mood and affect. Neurologic: Mental status: Alert, oriented to person, place, and time, thought content appropriate.   Results for orders placed or performed in visit on 03/30/22  POCT Urinalysis Dipstick  Result Value Ref Range   Color, UA     Clarity, UA     Glucose, UA Negative Negative   Bilirubin, UA negative    Ketones, UA negative    Spec Grav, UA 1.015 1.010 - 1.025   Blood, UA negative    pH, UA 6.0 5.0 - 8.0   Protein, UA Negative Negative   Urobilinogen, UA 0.2 0.2 or 1.0 E.U./dL   Nitrite, UA negative    Leukocytes, UA Negative Negative   Appearance     Odor      Assessment & Plan     1. Frequent urination U/a is normal.   2. Essential (primary) hypertension Well controlled.  Continue current medications.   - Magnesium  3. Prediabetes  - Hemoglobin A1c  4. Hyperlipidemia, unspecified hyperlipidemia type Diet controlled  5. Hypothyroidism, unspecified type More fatigued with significant weight gain over the last 6 months.  - TSH + free T4  6. Pica  - Iron, TIBC and Ferritin Panel  7. Dry mouth  - Iron, TIBC and Ferritin Panel  8. H/O gastric bypass   9. Vitamin A deficiency  - Vitamin A  10. Vitamin B1 deficiency  - Vitamin B1  11. Panic disorder She is having more anxiety, would to increase from 39m to sertraline (ZOLOFT) 50 MG tablet; Take 2 tablets (100 mg total) by mouth daily.  Advised of potential adverse effects including worsening of dry mouth   12. Obstructive sleep apnea She has not used CPAP for several years and does not fill like she is having any apnea issues  13. Parkinson's disease Mild tremor at this point. Continue regular follow  up Dr. SManuella Ghazias scheduled.      The entirety of the information documented in the History of Present Illness, Review of Systems and Physical Exam were personally obtained by me. Portions of this information were initially documented by the CMA and reviewed by me for thoroughness and accuracy.     DLelon Huh MD  [phone](phone) [phone](fax)  [place]

## 2022-03-30 ENCOUNTER — Ambulatory Visit (INDEPENDENT_AMBULATORY_CARE_PROVIDER_SITE_OTHER): Payer: Medicare Other | Admitting: Family Medicine

## 2022-03-30 ENCOUNTER — Encounter: Payer: Self-pay | Admitting: Family Medicine

## 2022-03-30 VITALS — BP 122/48 | HR 83 | Wt 184.8 lb

## 2022-03-30 DIAGNOSIS — E785 Hyperlipidemia, unspecified: Secondary | ICD-10-CM

## 2022-03-30 DIAGNOSIS — G20A1 Parkinson's disease without dyskinesia, without mention of fluctuations: Secondary | ICD-10-CM

## 2022-03-30 DIAGNOSIS — Z9884 Bariatric surgery status: Secondary | ICD-10-CM | POA: Diagnosis not present

## 2022-03-30 DIAGNOSIS — R682 Dry mouth, unspecified: Secondary | ICD-10-CM

## 2022-03-30 DIAGNOSIS — F410 Panic disorder [episodic paroxysmal anxiety] without agoraphobia: Secondary | ICD-10-CM

## 2022-03-30 DIAGNOSIS — E039 Hypothyroidism, unspecified: Secondary | ICD-10-CM

## 2022-03-30 DIAGNOSIS — R350 Frequency of micturition: Secondary | ICD-10-CM

## 2022-03-30 DIAGNOSIS — E519 Thiamine deficiency, unspecified: Secondary | ICD-10-CM

## 2022-03-30 DIAGNOSIS — I10 Essential (primary) hypertension: Secondary | ICD-10-CM | POA: Diagnosis not present

## 2022-03-30 DIAGNOSIS — R7303 Prediabetes: Secondary | ICD-10-CM | POA: Diagnosis not present

## 2022-03-30 DIAGNOSIS — G4733 Obstructive sleep apnea (adult) (pediatric): Secondary | ICD-10-CM | POA: Diagnosis not present

## 2022-03-30 DIAGNOSIS — E509 Vitamin A deficiency, unspecified: Secondary | ICD-10-CM | POA: Diagnosis not present

## 2022-03-30 DIAGNOSIS — F5089 Other specified eating disorder: Secondary | ICD-10-CM

## 2022-03-30 LAB — POCT URINALYSIS DIPSTICK
Bilirubin, UA: NEGATIVE
Blood, UA: NEGATIVE
Glucose, UA: NEGATIVE
Ketones, UA: NEGATIVE
Leukocytes, UA: NEGATIVE
Nitrite, UA: NEGATIVE
Protein, UA: NEGATIVE
Spec Grav, UA: 1.015 (ref 1.010–1.025)
Urobilinogen, UA: 0.2 E.U./dL
pH, UA: 6 (ref 5.0–8.0)

## 2022-03-30 MED ORDER — SERTRALINE HCL 50 MG PO TABS: 100.0000 mg | ORAL_TABLET | Freq: Every day | ORAL | Status: DC

## 2022-04-03 ENCOUNTER — Encounter: Payer: Self-pay | Admitting: *Deleted

## 2022-04-04 ENCOUNTER — Other Ambulatory Visit: Payer: Self-pay | Admitting: *Deleted

## 2022-04-04 DIAGNOSIS — E611 Iron deficiency: Secondary | ICD-10-CM

## 2022-04-04 NOTE — Telephone Encounter (Signed)
Patient wants to know what she can do (concerning low iron) until her appointment to Hematology?

## 2022-04-05 LAB — IRON,TIBC AND FERRITIN PANEL
Ferritin: 7 ng/mL — ABNORMAL LOW (ref 15–150)
Iron Saturation: 5 % — CL (ref 15–55)
Iron: 22 ug/dL — ABNORMAL LOW (ref 27–139)
Total Iron Binding Capacity: 459 ug/dL — ABNORMAL HIGH (ref 250–450)
UIBC: 437 ug/dL — ABNORMAL HIGH (ref 118–369)

## 2022-04-05 LAB — MAGNESIUM: Magnesium: 2.1 mg/dL (ref 1.6–2.3)

## 2022-04-05 LAB — HEMOGLOBIN A1C
Est. average glucose Bld gHb Est-mCnc: 120 mg/dL
Hgb A1c MFr Bld: 5.8 % — ABNORMAL HIGH (ref 4.8–5.6)

## 2022-04-05 LAB — VITAMIN B1: Thiamine: 97 nmol/L (ref 66.5–200.0)

## 2022-04-05 LAB — TSH+FREE T4
Free T4: 1.41 ng/dL (ref 0.82–1.77)
TSH: 2.81 u[IU]/mL (ref 0.450–4.500)

## 2022-04-05 LAB — VITAMIN A: Vitamin A: 47.1 ug/dL (ref 22.0–69.5)

## 2022-04-10 ENCOUNTER — Encounter: Payer: Self-pay | Admitting: Internal Medicine

## 2022-04-10 ENCOUNTER — Inpatient Hospital Stay: Payer: Medicare Other | Attending: Internal Medicine | Admitting: Internal Medicine

## 2022-04-10 ENCOUNTER — Inpatient Hospital Stay: Payer: Medicare Other

## 2022-04-10 VITALS — BP 134/76 | HR 86 | Temp 98.6°F | Resp 20 | Wt 187.3 lb

## 2022-04-10 DIAGNOSIS — F32A Depression, unspecified: Secondary | ICD-10-CM | POA: Insufficient documentation

## 2022-04-10 DIAGNOSIS — G20A1 Parkinson's disease without dyskinesia, without mention of fluctuations: Secondary | ICD-10-CM | POA: Insufficient documentation

## 2022-04-10 DIAGNOSIS — E785 Hyperlipidemia, unspecified: Secondary | ICD-10-CM | POA: Diagnosis not present

## 2022-04-10 DIAGNOSIS — D509 Iron deficiency anemia, unspecified: Secondary | ICD-10-CM

## 2022-04-10 DIAGNOSIS — Z79890 Hormone replacement therapy (postmenopausal): Secondary | ICD-10-CM | POA: Insufficient documentation

## 2022-04-10 DIAGNOSIS — E039 Hypothyroidism, unspecified: Secondary | ICD-10-CM | POA: Diagnosis not present

## 2022-04-10 DIAGNOSIS — Z9884 Bariatric surgery status: Secondary | ICD-10-CM

## 2022-04-10 DIAGNOSIS — E119 Type 2 diabetes mellitus without complications: Secondary | ICD-10-CM | POA: Insufficient documentation

## 2022-04-10 DIAGNOSIS — E779 Disorder of glycoprotein metabolism, unspecified: Secondary | ICD-10-CM | POA: Diagnosis not present

## 2022-04-10 LAB — CBC WITH DIFFERENTIAL/PLATELET
Abs Immature Granulocytes: 0.04 10*3/uL (ref 0.00–0.07)
Basophils Absolute: 0.1 10*3/uL (ref 0.0–0.1)
Basophils Relative: 1 %
Eosinophils Absolute: 0.1 10*3/uL (ref 0.0–0.5)
Eosinophils Relative: 2 %
HCT: 33.7 % — ABNORMAL LOW (ref 36.0–46.0)
Hemoglobin: 10 g/dL — ABNORMAL LOW (ref 12.0–15.0)
Immature Granulocytes: 1 %
Lymphocytes Relative: 30 %
Lymphs Abs: 1.9 10*3/uL (ref 0.7–4.0)
MCH: 20.4 pg — ABNORMAL LOW (ref 26.0–34.0)
MCHC: 29.7 g/dL — ABNORMAL LOW (ref 30.0–36.0)
MCV: 68.8 fL — ABNORMAL LOW (ref 80.0–100.0)
Monocytes Absolute: 0.9 10*3/uL (ref 0.1–1.0)
Monocytes Relative: 13 %
Neutro Abs: 3.5 10*3/uL (ref 1.7–7.7)
Neutrophils Relative %: 53 %
Platelets: 392 10*3/uL (ref 150–400)
RBC: 4.9 MIL/uL (ref 3.87–5.11)
RDW: 16.1 % — ABNORMAL HIGH (ref 11.5–15.5)
WBC: 6.5 10*3/uL (ref 4.0–10.5)
nRBC: 0 % (ref 0.0–0.2)

## 2022-04-10 LAB — FOLATE: Folate: 18.8 ng/mL (ref >5.9)

## 2022-04-10 NOTE — Progress Notes (Signed)
Patient states her iron levels are really low. Patient states she has no energy. Sometimes her legs get really heavy. Patient has had a bunch of falls this year.

## 2022-04-10 NOTE — Progress Notes (Signed)
[place]  [phone] [phone]  ID: Brittany Benton OB: 1955-10-01  MR#: [id number]  HBZ#:[id number]  Patient Care Team: Birdie Sons, MD as PCP - General (Family Medicine) Rockey Situ, Kathlene November, MD as Consulting Physician (Cardiology) Earnestine Leys, MD (Specialist) [place], [place] (Optometry) Meade Maw, MD as Consulting Physician (Neurosurgery) Vladimir Crofts, MD as Consulting Physician (Neurology)  REFERRING PROVIDER: Dr. Lelon Huh  REASON FOR REFERRAL: IDA  HPI: Brittany Benton is a 66 y.o. female with past medical history of diabetes, hyperlipidemia, Parkinson's, GERD, osteoarthritis, OSA, gastric bypass surgery, RCC status post right nephrectomy in 1994 was referred to hematology for iron deficiency anemia.  Patient has a history of gastric bypass surgery.  Patient reports that in the spring 2023 she was admitted for failure to thrive and then was discharged to rehab facility.  Today her her food intake improved and with physical activity her strength got better.  She was recently diagnosed with Parkinson's disease and follows with Dr. Manuella Ghazi.  In past 1 month, she noticed low energy levels and shortness of breath on exertion.  Denies any dizziness.  Denies any black tarry stools, use of NSAIDs.  No prior history of anemia.  Labs from 03/30/2022 showed ferritin 7.  Vitamin A and B1 normal.  TSH normal.  UA negative.  Last colonoscopy was in 2012 which showed internal hemorrhoids.  Last CBC is from May 2023 which was normal at 13.   REVIEW OF SYSTEMS:   ROS  As per HPI. Otherwise, a complete review of systems is negative.  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   GERD (gastroesophageal reflux disease)    Hernia, incisional    after renal surgery   History of chicken pox    Hyperlipidemia     Renal cell carcinoma (HCC)    s/p right nephrectomy 1994   Sleep apnea     PAST SURGICAL HISTORY: Past Surgical History:  Procedure Laterality Date   APPENDECTOMY  1994   BREAST BIOPSY Right 1991   Negative   CHOLECYSTECTOMY  1994   LAPAROSCOPIC GASTRIC SLEEVE RESECTION  02/02/2016   Dr Duke Salvia at [facility]   Renal Cell Carcinoma   parotid gland removal  1990   also removed a tumor   TONSILLECTOMY     TOTAL HIP ARTHROPLASTY Right 08/08/2016   Procedure: TOTAL HIP ARTHROPLASTY;  Surgeon: Earnestine Leys, MD;  Location: [facility] ORS;  Service: Orthopedics;  Laterality: Right;    FAMILY HISTORY: Family History  Problem Relation Age of Onset   Osteoporosis Mother    Dementia Mother    Heart disease Father    Heart attack Father    Breast cancer Maternal Aunt     HEALTH MAINTENANCE: Social History   Tobacco Use   Smoking status: Never   Smokeless tobacco: Never  Substance Use Topics   Alcohol use: Not Currently    Alcohol/week: 0.0 standard drinks of alcohol   Drug use: No     Allergies  Allergen Reactions   Codeine Nausea And Vomiting   Morphine Hives and Swelling   Tape Other (See Comments)    blisters blisters   Tramadol Other (See Comments)    Hallucinations    Current Outpatient Medications  Medication Sig Dispense Refill   Ascorbic Acid (VITAMIN C PO) Take by  mouth.     atorvastatin (LIPITOR) 20 MG tablet TAKE 1 TABLET BY MOUTH EVERY EVENING 90 tablet 4   Cholecalciferol (VITAMIN D-3) 1000 units CAPS Take 1 capsule by mouth daily.     imipramine (TOFRANIL) 50 MG tablet TAKE ONE TABLET BY MOUTH TWICE DAILY 180 tablet 3   levothyroxine (SYNTHROID) 50 MCG tablet TAKE ONE TABLET BY MOUTH EVERY DAY 90 tablet 4   metoprolol succinate (TOPROL-XL) 50 MG 24 hr tablet TAKE ONE TABLET BY MOUTH EVERY DAY WITH OR IMMEDIATELY FOLLOWING A MEAL 90 tablet 4   Multiple Vitamins-Minerals (MULTIVITAMIN ADULT PO) Take 1 tablet by mouth daily.     omeprazole  (PRILOSEC) 20 MG capsule TAKE 1 CAPSULE BY MOUTH ONCE DAILY 90 capsule 4   sertraline (ZOLOFT) 50 MG tablet Take 2 tablets (100 mg total) by mouth daily.     ondansetron (ZOFRAN) 4 MG tablet Take 1 tablet (4 mg total) by mouth every 8 (eight) hours as needed for up to 10 doses for nausea or vomiting. (Patient not taking: Reported on 04/10/2022) 10 tablet 0   No current facility-administered medications for this visit.    OBJECTIVE: There were no vitals filed for this visit.   Body mass index is 33.18 kg/m.      General: Well-developed, well-nourished, no acute distress. Eyes: Pink conjunctiva, anicteric sclera. HEENT: Normocephalic, moist mucous membranes, clear oropharnyx. Lungs: Clear to auscultation bilaterally. Heart: Regular rate and rhythm. No rubs, murmurs, or gallops. Abdomen: Soft, nontender, nondistended. No organomegaly noted, normoactive bowel sounds. Musculoskeletal: No edema, cyanosis, or clubbing. Neuro: Alert, answering all questions appropriately. Cranial nerves grossly intact. Skin: No rashes or petechiae noted. Psych: Normal affect. Lymphatics: No cervical, calvicular, axillary or inguinal LAD.   LAB RESULTS:  Lab Results  Component Value Date   NA 143 09/05/2021   K 4.1 09/05/2021   CL 108 (H) 09/05/2021   CO2 20 09/05/2021   GLUCOSE 98 09/05/2021   BUN 8 09/05/2021   CREATININE 0.82 09/05/2021   CALCIUM 8.8 09/05/2021   PROT 5.8 (L) 09/05/2021   ALBUMIN 3.5 (L) 09/05/2021   AST 24 09/05/2021   ALT 10 09/05/2021   ALKPHOS 90 09/05/2021   BILITOT 0.4 09/05/2021   GFRNONAA >60 08/06/2021   GFRAA 60 05/15/2019    Lab Results  Component Value Date   WBC 5.9 09/05/2021   NEUTROABS 3.1 08/06/2021   HGB 13.0 09/05/2021   HCT 39.8 09/05/2021   MCV 83 09/05/2021   PLT 309 09/05/2021    Lab Results  Component Value Date   TIBC 459 (H) 03/30/2022   FERRITIN 7 (L) 03/30/2022   IRONPCTSAT 5 (LL) 03/30/2022     STUDIES: No results  found.  ASSESSMENT AND PLAN:   Brittany Benton is a 66 y.o. female with pmh of diabetes, hyperlipidemia, Parkinson's, GERD, osteoarthritis, OSA, gastric bypass surgery, RCC status post right nephrectomy in 1994 was referred to hematology for iron deficiency anemia.  # Iron deficiency anemia # History of gastric bypass surgery -Progressive. -Labs from 03/30/2022 showed ferritin 7. UA negative.  Last colonoscopy was in 2012 which showed internal hemorrhoids.  Last CBC is from May 2023 which was normal at 13. -I discussed with the patient about doing IV Feraheme weekly x 2 because she is symptomatic and IV will have a rapid response.  Low but potential risk of anaphylactic reaction was discussed.  Occasional side effects such as nausea and back pain discussed.  -There is a component of malabsorption from  gastric bypass surgery however bleeding could not be ruled out.  GI referral was placed. -I will also check B12 and folate level with history of gastric bypass surgery.  # Meningioma  - 1.1 cm meningioma, status post neurosurgery evaluation  # Parkinsonism -Follows with Dr. Manuella Ghazi neurology.  Was previously on carbidopa levodopa.  Per patient, she has discontinued.  # Hypothyroidism -On Synthroid  # Depression -On sertraline   Orders Placed This Encounter  Procedures   CBC with Differential   Vitamin B12   Folate   CBC with Differential/Platelet   Iron and TIBC   Ferritin   Schedule IV Feraheme weekly x 2 RTC in 8 weeks for MD visit, labs.  Patient expressed understanding and was in agreement with this plan. She also understands that She can call clinic at any time with any questions, concerns, or complaints.   I spent a total of 45 minutes reviewing chart data, face-to-face evaluation with the patient, counseling and coordination of care as detailed above.  Jane Canary, MD   04/10/2022 11:32 AM

## 2022-04-11 LAB — VITAMIN B12: Vitamin B-12: 123 pg/mL — ABNORMAL LOW (ref 180–914)

## 2022-04-13 ENCOUNTER — Telehealth: Payer: Self-pay | Admitting: Internal Medicine

## 2022-04-13 NOTE — Telephone Encounter (Signed)
Patient called to inform about vitamin B12 levels  B12 level low at 123.  Discussed about doing B12 injections weekly x 4.  Pt agreeable to proceed.    Schedule B12 injection weekly x 4 on Mondays.  Can add 2 injections with her iron infusions.

## 2022-04-23 ENCOUNTER — Inpatient Hospital Stay: Payer: Medicare Other | Attending: Internal Medicine

## 2022-04-23 VITALS — BP 101/54 | HR 86 | Temp 96.0°F | Resp 18

## 2022-04-23 DIAGNOSIS — E538 Deficiency of other specified B group vitamins: Secondary | ICD-10-CM | POA: Insufficient documentation

## 2022-04-23 DIAGNOSIS — D509 Iron deficiency anemia, unspecified: Secondary | ICD-10-CM | POA: Diagnosis not present

## 2022-04-23 MED ORDER — SODIUM CHLORIDE 0.9 % IV SOLN
Freq: Once | INTRAVENOUS | Status: AC
  Filled 2022-04-23: qty 250

## 2022-04-23 MED ORDER — SODIUM CHLORIDE 0.9 % IV SOLN
510.0000 mg | Freq: Once | INTRAVENOUS | Status: AC
  Administered 2022-04-23: 510 mg via INTRAVENOUS
  Filled 2022-04-23: qty 510

## 2022-04-23 MED ORDER — CYANOCOBALAMIN 1000 MCG/ML IJ SOLN
1000.0000 ug | Freq: Once | INTRAMUSCULAR | Status: AC
  Administered 2022-04-23: 1000 ug via INTRAMUSCULAR
  Filled 2022-04-23: qty 1

## 2022-04-23 NOTE — Patient Instructions (Signed)

## 2022-04-27 DIAGNOSIS — Z862 Personal history of diseases of the blood and blood-forming organs and certain disorders involving the immune mechanism: Secondary | ICD-10-CM | POA: Diagnosis not present

## 2022-04-27 DIAGNOSIS — D329 Benign neoplasm of meninges, unspecified: Secondary | ICD-10-CM | POA: Diagnosis not present

## 2022-04-27 DIAGNOSIS — F32A Depression, unspecified: Secondary | ICD-10-CM | POA: Diagnosis not present

## 2022-04-27 DIAGNOSIS — R29898 Other symptoms and signs involving the musculoskeletal system: Secondary | ICD-10-CM | POA: Diagnosis not present

## 2022-04-27 DIAGNOSIS — G20C Parkinsonism, unspecified: Secondary | ICD-10-CM | POA: Diagnosis not present

## 2022-04-27 DIAGNOSIS — Z8669 Personal history of other diseases of the nervous system and sense organs: Secondary | ICD-10-CM | POA: Diagnosis not present

## 2022-04-27 DIAGNOSIS — E538 Deficiency of other specified B group vitamins: Secondary | ICD-10-CM | POA: Diagnosis not present

## 2022-04-27 DIAGNOSIS — E440 Moderate protein-calorie malnutrition: Secondary | ICD-10-CM | POA: Diagnosis not present

## 2022-04-30 ENCOUNTER — Inpatient Hospital Stay: Payer: Medicare Other

## 2022-04-30 ENCOUNTER — Encounter: Payer: Self-pay | Admitting: Internal Medicine

## 2022-04-30 VITALS — BP 134/62 | HR 78 | Temp 97.6°F | Resp 16

## 2022-04-30 DIAGNOSIS — D509 Iron deficiency anemia, unspecified: Secondary | ICD-10-CM | POA: Diagnosis not present

## 2022-04-30 DIAGNOSIS — E538 Deficiency of other specified B group vitamins: Secondary | ICD-10-CM | POA: Diagnosis not present

## 2022-04-30 MED ORDER — SODIUM CHLORIDE 0.9 % IV SOLN
Freq: Once | INTRAVENOUS | Status: AC
  Filled 2022-04-30: qty 250

## 2022-04-30 MED ORDER — CYANOCOBALAMIN 1000 MCG/ML IJ SOLN
1000.0000 ug | Freq: Once | INTRAMUSCULAR | Status: AC
  Administered 2022-04-30: 1000 ug via INTRAMUSCULAR
  Filled 2022-04-30: qty 1

## 2022-04-30 MED ORDER — SODIUM CHLORIDE 0.9 % IV SOLN
510.0000 mg | Freq: Once | INTRAVENOUS | Status: AC
  Administered 2022-04-30: 510 mg via INTRAVENOUS
  Filled 2022-04-30: qty 510

## 2022-05-07 ENCOUNTER — Inpatient Hospital Stay: Payer: Medicare Other

## 2022-05-07 DIAGNOSIS — D509 Iron deficiency anemia, unspecified: Secondary | ICD-10-CM

## 2022-05-07 DIAGNOSIS — E538 Deficiency of other specified B group vitamins: Secondary | ICD-10-CM | POA: Diagnosis not present

## 2022-05-07 MED ORDER — CYANOCOBALAMIN 1000 MCG/ML IJ SOLN
1000.0000 ug | Freq: Once | INTRAMUSCULAR | Status: AC
  Administered 2022-05-07: 1000 ug via INTRAMUSCULAR
  Filled 2022-05-07: qty 1

## 2022-05-14 ENCOUNTER — Inpatient Hospital Stay: Payer: Medicare Other

## 2022-05-14 DIAGNOSIS — D509 Iron deficiency anemia, unspecified: Secondary | ICD-10-CM

## 2022-05-14 DIAGNOSIS — E538 Deficiency of other specified B group vitamins: Secondary | ICD-10-CM | POA: Diagnosis not present

## 2022-05-14 MED ORDER — CYANOCOBALAMIN 1000 MCG/ML IJ SOLN
1000.0000 ug | Freq: Once | INTRAMUSCULAR | Status: AC
  Administered 2022-05-14: 1000 ug via INTRAMUSCULAR
  Filled 2022-05-14: qty 1

## 2022-05-30 ENCOUNTER — Other Ambulatory Visit: Payer: Self-pay | Admitting: Family Medicine

## 2022-05-30 DIAGNOSIS — F410 Panic disorder [episodic paroxysmal anxiety] without agoraphobia: Secondary | ICD-10-CM

## 2022-05-30 NOTE — Telephone Encounter (Signed)
Medication Refill - Medication: sertraline (ZOLOFT) 50 MG tablet   Has the patient contacted their pharmacy? Yes.   (Agent: If no, request that the patient contact the pharmacy for the refill. If patient does not wish to contact the pharmacy document the reason why and proceed with request.) (Agent: If yes, when and what did the pharmacy advise?)  Preferred Pharmacy (with phone number or street name):  [place], [place] - [place] Phone: [phone]  Fax: [phone]     Has the patient been seen for an appointment in the last year OR does the patient have an upcoming appointment? Yes.    Agent: Please be advised that RX refills may take up to 3 business days. We ask that you follow-up with your pharmacy.

## 2022-05-30 NOTE — Telephone Encounter (Signed)
Requested medication (s) are due for refill today - no  Requested medication (s) are on the active medication list -yes  Future visit scheduled -no  Last refill: 03/30/22   Notes to clinic: Last Rx marked as "no print" may not have gone to pharmacy- sent for review    Requested Prescriptions  Pending Prescriptions Disp Refills   sertraline (ZOLOFT) 50 MG tablet      Sig: Take 2 tablets (100 mg total) by mouth daily.     Psychiatry:  Antidepressants - SSRI - sertraline Passed - 05/30/2022 11:21 AM      Passed - AST in normal range and within 360 days    AST  Date Value Ref Range Status  09/05/2021 24 0 - 40 IU/L Final         Passed - ALT in normal range and within 360 days    ALT  Date Value Ref Range Status  09/05/2021 10 0 - 32 IU/L Final         Passed - Completed PHQ-2 or PHQ-9 in the last 360 days      Passed - Valid encounter within last 6 months    Recent Outpatient Visits           2 months ago Frequent urination   [place] Birdie Sons, MD   8 months ago Moderate protein-calorie malnutrition [facility])   [place] Birdie Sons, MD   10 months ago [place], Donald E, MD   10 months ago Adverse effect of drug, initial encounter   [place], Donald E, MD   1 year ago Meningioma [facility])   [place], [place]                 Requested Prescriptions  Pending Prescriptions Disp Refills   sertraline (ZOLOFT) 50 MG tablet      Sig: Take 2 tablets (100 mg total) by mouth daily.     Psychiatry:  Antidepressants - SSRI - sertraline Passed - 05/30/2022 11:21 AM      Passed - AST in normal range and within 360 days    AST  Date Value Ref Range Status  09/05/2021 24 0 - 40 IU/L Final         Passed - ALT in normal range and within 360 days    ALT  Date Value Ref Range Status   09/05/2021 10 0 - 32 IU/L Final         Passed - Completed PHQ-2 or PHQ-9 in the last 360 days      Passed - Valid encounter within last 6 months    Recent Outpatient Visits           2 months ago Frequent urination   [place] Birdie Sons, MD   8 months ago Moderate protein-calorie malnutrition [facility])   [place] Birdie Sons, MD   10 months ago [place], Donald E, MD   10 months ago Adverse effect of drug, initial encounter   [place], Donald E, MD   1 year ago Meningioma [facility])   [facility] Birdie Sons, MD

## 2022-05-31 MED ORDER — SERTRALINE HCL 100 MG PO TABS: 100.0000 mg | ORAL_TABLET | Freq: Every day | ORAL | 0 refills | Status: DC

## 2022-05-31 NOTE — Telephone Encounter (Signed)
Requested medication (s) are due for refill today: yes  Requested medication (s) are on the active medication list: yes  Last refill:  03/30/22  Future visit scheduled: yes  Notes to clinic:  To pharmacy: PT TAKING 2 PER DAY NOW. PLEASE SEND UPDATED RX IF THIS IS CORRECT. THANKS . Routing for new Rx prescrtion.     Requested Prescriptions  Pending Prescriptions Disp Refills   sertraline (ZOLOFT) 50 MG tablet [Pharmacy Med Name: SERTRALINE HCL 50 MG TAB] 90 tablet     Sig: TAKE ONE TABLET BY MOUTH EVERY DAY     Psychiatry:  Antidepressants - SSRI - sertraline Passed - 05/30/2022  6:21 PM      Passed - AST in normal range and within 360 days    AST  Date Value Ref Range Status  09/05/2021 24 0 - 40 IU/L Final         Passed - ALT in normal range and within 360 days    ALT  Date Value Ref Range Status  09/05/2021 10 0 - 32 IU/L Final         Passed - Completed PHQ-2 or PHQ-9 in the last 360 days      Passed - Valid encounter within last 6 months    Recent Outpatient Visits           2 months ago Frequent urination   [place] Birdie Sons, MD   8 months ago Moderate protein-calorie malnutrition [facility])   [place] Birdie Sons, MD   10 months ago [place], Donald E, MD   10 months ago Adverse effect of drug, initial encounter   [place], Donald E, MD   1 year ago Meningioma [facility])   [facility] Birdie Sons, MD

## 2022-06-11 ENCOUNTER — Inpatient Hospital Stay (HOSPITAL_BASED_OUTPATIENT_CLINIC_OR_DEPARTMENT_OTHER): Payer: Medicare Other | Admitting: Internal Medicine

## 2022-06-11 ENCOUNTER — Inpatient Hospital Stay: Payer: Medicare Other | Attending: Internal Medicine

## 2022-06-11 ENCOUNTER — Other Ambulatory Visit: Payer: Self-pay

## 2022-06-11 ENCOUNTER — Encounter: Payer: Self-pay | Admitting: Internal Medicine

## 2022-06-11 VITALS — BP 129/73 | HR 76 | Temp 98.6°F | Resp 20 | Wt 195.2 lb

## 2022-06-11 DIAGNOSIS — E039 Hypothyroidism, unspecified: Secondary | ICD-10-CM | POA: Diagnosis not present

## 2022-06-11 DIAGNOSIS — Z9884 Bariatric surgery status: Secondary | ICD-10-CM

## 2022-06-11 DIAGNOSIS — E538 Deficiency of other specified B group vitamins: Secondary | ICD-10-CM | POA: Insufficient documentation

## 2022-06-11 DIAGNOSIS — D509 Iron deficiency anemia, unspecified: Secondary | ICD-10-CM

## 2022-06-11 DIAGNOSIS — Z79899 Other long term (current) drug therapy: Secondary | ICD-10-CM | POA: Diagnosis not present

## 2022-06-11 DIAGNOSIS — F32A Depression, unspecified: Secondary | ICD-10-CM | POA: Insufficient documentation

## 2022-06-11 DIAGNOSIS — G20C Parkinsonism, unspecified: Secondary | ICD-10-CM | POA: Insufficient documentation

## 2022-06-11 DIAGNOSIS — D329 Benign neoplasm of meninges, unspecified: Secondary | ICD-10-CM | POA: Insufficient documentation

## 2022-06-11 LAB — CBC WITH DIFFERENTIAL/PLATELET
Abs Immature Granulocytes: 0.03 10*3/uL (ref 0.00–0.07)
Basophils Absolute: 0.1 10*3/uL (ref 0.0–0.1)
Basophils Relative: 1 %
Eosinophils Absolute: 0.1 10*3/uL (ref 0.0–0.5)
Eosinophils Relative: 1 %
HCT: 43.1 % (ref 36.0–46.0)
Hemoglobin: 13.5 g/dL (ref 12.0–15.0)
Immature Granulocytes: 1 %
Lymphocytes Relative: 28 %
Lymphs Abs: 1.6 10*3/uL (ref 0.7–4.0)
MCH: 25.2 pg — ABNORMAL LOW (ref 26.0–34.0)
MCHC: 31.3 g/dL (ref 30.0–36.0)
MCV: 80.6 fL (ref 80.0–100.0)
Monocytes Absolute: 0.7 10*3/uL (ref 0.1–1.0)
Monocytes Relative: 13 %
Neutro Abs: 3.1 10*3/uL (ref 1.7–7.7)
Neutrophils Relative %: 56 %
Platelets: 276 10*3/uL (ref 150–400)
RBC: 5.35 MIL/uL — ABNORMAL HIGH (ref 3.87–5.11)
RDW: 23.3 % — ABNORMAL HIGH (ref 11.5–15.5)
WBC: 5.6 10*3/uL (ref 4.0–10.5)
nRBC: 0 % (ref 0.0–0.2)

## 2022-06-11 LAB — IRON AND TIBC
Iron: 81 ug/dL (ref 28–170)
Saturation Ratios: 23 % (ref 10.4–31.8)
TIBC: 356 ug/dL (ref 250–450)
UIBC: 275 ug/dL

## 2022-06-11 LAB — FERRITIN: Ferritin: 85 ng/mL (ref 11–307)

## 2022-06-11 LAB — VITAMIN B12: Vitamin B-12: 240 pg/mL (ref 180–914)

## 2022-06-11 NOTE — Progress Notes (Signed)
[place]  Telephone:([phone]) [phone] Fax:(336) [phone]  ID: Brittany Benton OB: 1956/01/19  MR#: [id number]  [id number]  Patient Care Team: Birdie Sons, MD as PCP - General (Family Medicine) Rockey Situ Kathlene November, MD as Consulting Physician (Cardiology) Earnestine Leys, MD (Specialist) [place], [facility]) Meade Maw, MD as Consulting Physician (Neurosurgery) Vladimir Crofts, MD as Consulting Physician (Neurology)  HPI: Brittany Benton is a 67 y.o. female with past medical history of diabetes, hyperlipidemia, Parkinson's, GERD, osteoarthritis, OSA, gastric bypass surgery, RCC status post right nephrectomy in 1994 was referred to hematology for iron deficiency anemia.  Patient has a history of gastric bypass surgery.  Patient reports that in the spring 2023 she was admitted for failure to thrive and then was discharged to rehab facility.  Today her her food intake improved and with physical activity her strength got better.  She was recently diagnosed with Parkinson's disease and follows with Dr. Manuella Ghazi.  In past 1 month, she noticed low energy levels and shortness of breath on exertion.  Denies any dizziness.  Denies any black tarry stools, use of NSAIDs.  No prior history of anemia.  Labs from 03/30/2022 showed ferritin 7.  Vitamin A and B1 normal.  TSH normal.  UA negative.  Last colonoscopy was in 2012 which showed internal hemorrhoids.  Last CBC is from May 2023 which was normal at 13.   INTERVAL HISTORY-  Patient was seen today as follow-up post iron infusion to discuss labs. She tolerated iron infusions well.  Reports improvement in energy levels.  Did not hear back from GI for scheduling.  Denies any bleeding.  REVIEW OF SYSTEMS:   ROS  As per HPI. Otherwise, a complete review of systems is negative.  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   GERD (gastroesophageal reflux disease)    Hernia, incisional    after renal surgery   History of chicken pox    Hyperlipidemia    Renal cell carcinoma (HCC)    s/p right nephrectomy 1994   Sleep apnea     PAST SURGICAL HISTORY: Past Surgical History:  Procedure Laterality Date   APPENDECTOMY  1994   BREAST BIOPSY Right 1991   Negative   CHOLECYSTECTOMY  1994   LAPAROSCOPIC GASTRIC SLEEVE RESECTION  02/02/2016   Dr Duke Salvia at [place]   Renal Cell Carcinoma   parotid gland removal  1990   also removed a tumor   TONSILLECTOMY     TOTAL HIP ARTHROPLASTY Right 08/08/2016   Procedure: TOTAL HIP ARTHROPLASTY;  Surgeon: Earnestine Leys, MD;  Location: [facility] ORS;  Service: Orthopedics;  Laterality: Right;    FAMILY HISTORY: Family History  Problem Relation Age of Onset   Osteoporosis Mother    Dementia Mother    Heart disease Father    Heart attack Father    Breast cancer Maternal Aunt     HEALTH MAINTENANCE: Social History   Tobacco Use   Smoking status: Never   Smokeless tobacco: Never  Substance Use Topics   Alcohol use: Not Currently    Alcohol/week: 0.0 standard drinks of alcohol   Drug use: No     Allergies  Allergen Reactions   Codeine Nausea And Vomiting   Morphine Hives and Swelling   Tape Other (See Comments)    blisters blisters  Tramadol Other (See Comments)    Hallucinations    Current Outpatient Medications  Medication Sig Dispense Refill   Ascorbic Acid (VITAMIN C PO) Take by mouth.     atorvastatin (LIPITOR) 20 MG tablet TAKE 1 TABLET BY MOUTH EVERY EVENING 90 tablet 4   Cholecalciferol (VITAMIN D-3) 1000 units CAPS Take 1 capsule by mouth daily.     imipramine (TOFRANIL) 50 MG tablet TAKE ONE TABLET BY MOUTH TWICE DAILY 180 tablet 3   levothyroxine (SYNTHROID) 50 MCG tablet TAKE ONE TABLET BY MOUTH EVERY DAY 90 tablet 4   metoprolol succinate (TOPROL-XL) 50 MG 24 hr tablet TAKE ONE TABLET BY MOUTH  EVERY DAY WITH OR IMMEDIATELY FOLLOWING A MEAL 90 tablet 4   omeprazole (PRILOSEC) 20 MG capsule TAKE 1 CAPSULE BY MOUTH ONCE DAILY 90 capsule 4   sertraline (ZOLOFT) 100 MG tablet Take 1 tablet (100 mg total) by mouth daily. 90 tablet 0   Multiple Vitamins-Minerals (MULTIVITAMIN ADULT PO) Take 1 tablet by mouth daily. (Patient not taking: Reported on 06/11/2022)     ondansetron (ZOFRAN) 4 MG tablet Take 1 tablet (4 mg total) by mouth every 8 (eight) hours as needed for up to 10 doses for nausea or vomiting. (Patient not taking: Reported on 04/10/2022) 10 tablet 0   No current facility-administered medications for this visit.    OBJECTIVE: Vitals:   06/11/22 1100  BP: 129/73  Pulse: 76  Resp: 20  Temp: 98.6 F (37 C)  SpO2: 100%     Body mass index is 34.58 kg/m.      General: Well-developed, well-nourished, no acute distress. Eyes: Pink conjunctiva, anicteric sclera. HEENT: Normocephalic, moist mucous membranes, clear oropharnyx. Lungs: Clear to auscultation bilaterally. Heart: Regular rate and rhythm. No rubs, murmurs, or gallops. Abdomen: Soft, nontender, nondistended. No organomegaly noted, normoactive bowel sounds. Musculoskeletal: No edema, cyanosis, or clubbing. Neuro: Alert, answering all questions appropriately. Cranial nerves grossly intact. Skin: No rashes or petechiae noted. Psych: Normal affect. Lymphatics: No cervical, calvicular, axillary or inguinal LAD.   LAB RESULTS:  Lab Results  Component Value Date   NA 143 09/05/2021   K 4.1 09/05/2021   CL 108 (H) 09/05/2021   CO2 20 09/05/2021   GLUCOSE 98 09/05/2021   BUN 8 09/05/2021   CREATININE 0.82 09/05/2021   CALCIUM 8.8 09/05/2021   PROT 5.8 (L) 09/05/2021   ALBUMIN 3.5 (L) 09/05/2021   AST 24 09/05/2021   ALT 10 09/05/2021   ALKPHOS 90 09/05/2021   BILITOT 0.4 09/05/2021   GFRNONAA >60 08/06/2021   GFRAA 60 05/15/2019    Lab Results  Component Value Date   WBC 5.6 06/11/2022   NEUTROABS 3.1  06/11/2022   HGB 13.5 06/11/2022   HCT 43.1 06/11/2022   MCV 80.6 06/11/2022   PLT 276 06/11/2022    Lab Results  Component Value Date   TIBC 356 06/11/2022   TIBC 459 (H) 03/30/2022   FERRITIN 85 06/11/2022   FERRITIN 7 (L) 03/30/2022   IRONPCTSAT 23 06/11/2022   IRONPCTSAT 5 (LL) 03/30/2022     STUDIES: No results found.  ASSESSMENT AND PLAN:   Brittany Benton is a 67 y.o. female with pmh of diabetes, hyperlipidemia, Parkinson's, GERD, osteoarthritis, OSA, gastric bypass surgery, RCC status post right nephrectomy in 1994 was referred to hematology for iron deficiency anemia.  # Iron deficiency anemia # History of gastric bypass surgery -Labs from 03/30/2022 showed ferritin 7. UA negative.  Received IV Feraheme x 2 in January  2024.  Tolerated well.  Hemoglobin normalized.  Iron panel is pending. -Last colonoscopy was in 2012 which showed internal hemorrhoids.  GI referral was placed last time but for some reason she did not hear back.  Will place another referral today.  # B12 deficiency -Secondary to gastric bypass surgery.  Received B12 injection weekly x 4. -Advised to start maintenance oral B12 1000 mcg daily.  # Meningioma  - 1.1 cm meningioma, status post neurosurgery evaluation  # Parkinsonism -Follows with Dr. Manuella Ghazi neurology.  Was previously on carbidopa levodopa.  Per patient, she has discontinued.  # Hypothyroidism -On Synthroid  # Depression -On sertraline   Orders Placed This Encounter  Procedures   CBC with Differential/Platelet   Iron and TIBC   Ferritin   Vitamin B12   Folate   Vitamin B12   Ambulatory referral to Gastroenterology   RTC in 4 months for MD visit, labs.  Patient expressed understanding and was in agreement with this plan. She also understands that She can call clinic at any time with any questions, concerns, or complaints.   I spent a total of 30 minutes reviewing chart data, face-to-face evaluation with the patient, counseling  and coordination of care as detailed above.  Jane Canary, MD   06/11/2022 2:11 PM

## 2022-06-11 NOTE — Patient Instructions (Signed)
Take vitamin b12 1000 mcg daily.

## 2022-07-05 ENCOUNTER — Telehealth: Payer: Self-pay | Admitting: Family Medicine

## 2022-07-05 NOTE — Telephone Encounter (Signed)
Called patient to schedule Medicare Annual Wellness Visit (AWV). Left message for patient to call back and schedule Medicare Annual Wellness Visit (AWV).  Last date of AWV: 05/08/2022  Please schedule an appointment at any time with nha.  If any questions, please contact me at [phone].  Thank you ,  [place] Direct Dial: [phone]

## 2022-07-09 DIAGNOSIS — R531 Weakness: Secondary | ICD-10-CM | POA: Diagnosis not present

## 2022-07-09 DIAGNOSIS — G20C Parkinsonism, unspecified: Secondary | ICD-10-CM | POA: Diagnosis not present

## 2022-07-09 DIAGNOSIS — R296 Repeated falls: Secondary | ICD-10-CM | POA: Diagnosis not present

## 2022-07-09 DIAGNOSIS — R262 Difficulty in walking, not elsewhere classified: Secondary | ICD-10-CM | POA: Diagnosis not present

## 2022-07-11 DIAGNOSIS — G20C Parkinsonism, unspecified: Secondary | ICD-10-CM | POA: Diagnosis not present

## 2022-07-11 DIAGNOSIS — R262 Difficulty in walking, not elsewhere classified: Secondary | ICD-10-CM | POA: Diagnosis not present

## 2022-07-11 DIAGNOSIS — R531 Weakness: Secondary | ICD-10-CM | POA: Diagnosis not present

## 2022-07-11 DIAGNOSIS — R296 Repeated falls: Secondary | ICD-10-CM | POA: Diagnosis not present

## 2022-07-14 DIAGNOSIS — G4733 Obstructive sleep apnea (adult) (pediatric): Secondary | ICD-10-CM | POA: Diagnosis not present

## 2022-07-18 DIAGNOSIS — R531 Weakness: Secondary | ICD-10-CM | POA: Diagnosis not present

## 2022-07-18 DIAGNOSIS — G20C Parkinsonism, unspecified: Secondary | ICD-10-CM | POA: Diagnosis not present

## 2022-07-18 DIAGNOSIS — R262 Difficulty in walking, not elsewhere classified: Secondary | ICD-10-CM | POA: Diagnosis not present

## 2022-07-18 DIAGNOSIS — R296 Repeated falls: Secondary | ICD-10-CM | POA: Diagnosis not present

## 2022-07-20 DIAGNOSIS — R262 Difficulty in walking, not elsewhere classified: Secondary | ICD-10-CM | POA: Diagnosis not present

## 2022-07-20 DIAGNOSIS — R296 Repeated falls: Secondary | ICD-10-CM | POA: Diagnosis not present

## 2022-07-20 DIAGNOSIS — R531 Weakness: Secondary | ICD-10-CM | POA: Diagnosis not present

## 2022-07-20 DIAGNOSIS — G20C Parkinsonism, unspecified: Secondary | ICD-10-CM | POA: Diagnosis not present

## 2022-07-23 DIAGNOSIS — R296 Repeated falls: Secondary | ICD-10-CM | POA: Diagnosis not present

## 2022-07-23 DIAGNOSIS — G20C Parkinsonism, unspecified: Secondary | ICD-10-CM | POA: Diagnosis not present

## 2022-07-23 DIAGNOSIS — R531 Weakness: Secondary | ICD-10-CM | POA: Diagnosis not present

## 2022-07-23 DIAGNOSIS — R262 Difficulty in walking, not elsewhere classified: Secondary | ICD-10-CM | POA: Diagnosis not present

## 2022-07-24 ENCOUNTER — Other Ambulatory Visit: Payer: Self-pay | Admitting: Family Medicine

## 2022-07-24 DIAGNOSIS — F410 Panic disorder [episodic paroxysmal anxiety] without agoraphobia: Secondary | ICD-10-CM

## 2022-07-27 DIAGNOSIS — R296 Repeated falls: Secondary | ICD-10-CM | POA: Diagnosis not present

## 2022-07-27 DIAGNOSIS — R531 Weakness: Secondary | ICD-10-CM | POA: Diagnosis not present

## 2022-07-27 DIAGNOSIS — G20C Parkinsonism, unspecified: Secondary | ICD-10-CM | POA: Diagnosis not present

## 2022-07-27 DIAGNOSIS — R262 Difficulty in walking, not elsewhere classified: Secondary | ICD-10-CM | POA: Diagnosis not present

## 2022-07-30 DIAGNOSIS — R531 Weakness: Secondary | ICD-10-CM | POA: Diagnosis not present

## 2022-07-30 DIAGNOSIS — R296 Repeated falls: Secondary | ICD-10-CM | POA: Diagnosis not present

## 2022-07-30 DIAGNOSIS — R262 Difficulty in walking, not elsewhere classified: Secondary | ICD-10-CM | POA: Diagnosis not present

## 2022-07-30 DIAGNOSIS — G20C Parkinsonism, unspecified: Secondary | ICD-10-CM | POA: Diagnosis not present

## 2022-08-01 DIAGNOSIS — R296 Repeated falls: Secondary | ICD-10-CM | POA: Diagnosis not present

## 2022-08-01 DIAGNOSIS — R531 Weakness: Secondary | ICD-10-CM | POA: Diagnosis not present

## 2022-08-01 DIAGNOSIS — R262 Difficulty in walking, not elsewhere classified: Secondary | ICD-10-CM | POA: Diagnosis not present

## 2022-08-01 DIAGNOSIS — G20C Parkinsonism, unspecified: Secondary | ICD-10-CM | POA: Diagnosis not present

## 2022-08-06 DIAGNOSIS — R296 Repeated falls: Secondary | ICD-10-CM | POA: Diagnosis not present

## 2022-08-06 DIAGNOSIS — G20C Parkinsonism, unspecified: Secondary | ICD-10-CM | POA: Diagnosis not present

## 2022-08-06 DIAGNOSIS — R262 Difficulty in walking, not elsewhere classified: Secondary | ICD-10-CM | POA: Diagnosis not present

## 2022-08-06 DIAGNOSIS — R531 Weakness: Secondary | ICD-10-CM | POA: Diagnosis not present

## 2022-08-08 DIAGNOSIS — R531 Weakness: Secondary | ICD-10-CM | POA: Diagnosis not present

## 2022-08-08 DIAGNOSIS — F32A Depression, unspecified: Secondary | ICD-10-CM | POA: Diagnosis not present

## 2022-08-08 DIAGNOSIS — D329 Benign neoplasm of meninges, unspecified: Secondary | ICD-10-CM | POA: Diagnosis not present

## 2022-08-08 DIAGNOSIS — Z8669 Personal history of other diseases of the nervous system and sense organs: Secondary | ICD-10-CM | POA: Diagnosis not present

## 2022-08-08 DIAGNOSIS — R262 Difficulty in walking, not elsewhere classified: Secondary | ICD-10-CM | POA: Diagnosis not present

## 2022-08-08 DIAGNOSIS — G20C Parkinsonism, unspecified: Secondary | ICD-10-CM | POA: Diagnosis not present

## 2022-08-08 DIAGNOSIS — R296 Repeated falls: Secondary | ICD-10-CM | POA: Diagnosis not present

## 2022-08-13 DIAGNOSIS — R262 Difficulty in walking, not elsewhere classified: Secondary | ICD-10-CM | POA: Diagnosis not present

## 2022-08-13 DIAGNOSIS — G20C Parkinsonism, unspecified: Secondary | ICD-10-CM | POA: Diagnosis not present

## 2022-08-13 DIAGNOSIS — R296 Repeated falls: Secondary | ICD-10-CM | POA: Diagnosis not present

## 2022-08-13 DIAGNOSIS — R531 Weakness: Secondary | ICD-10-CM | POA: Diagnosis not present

## 2022-08-15 DIAGNOSIS — R262 Difficulty in walking, not elsewhere classified: Secondary | ICD-10-CM | POA: Diagnosis not present

## 2022-08-15 DIAGNOSIS — G20C Parkinsonism, unspecified: Secondary | ICD-10-CM | POA: Diagnosis not present

## 2022-08-15 DIAGNOSIS — R296 Repeated falls: Secondary | ICD-10-CM | POA: Diagnosis not present

## 2022-08-15 DIAGNOSIS — R531 Weakness: Secondary | ICD-10-CM | POA: Diagnosis not present

## 2022-08-20 DIAGNOSIS — R296 Repeated falls: Secondary | ICD-10-CM | POA: Diagnosis not present

## 2022-08-20 DIAGNOSIS — R262 Difficulty in walking, not elsewhere classified: Secondary | ICD-10-CM | POA: Diagnosis not present

## 2022-08-20 DIAGNOSIS — R531 Weakness: Secondary | ICD-10-CM | POA: Diagnosis not present

## 2022-08-20 DIAGNOSIS — G20C Parkinsonism, unspecified: Secondary | ICD-10-CM | POA: Diagnosis not present

## 2022-08-22 DIAGNOSIS — R296 Repeated falls: Secondary | ICD-10-CM | POA: Diagnosis not present

## 2022-08-22 DIAGNOSIS — R531 Weakness: Secondary | ICD-10-CM | POA: Diagnosis not present

## 2022-08-22 DIAGNOSIS — R262 Difficulty in walking, not elsewhere classified: Secondary | ICD-10-CM | POA: Diagnosis not present

## 2022-08-22 DIAGNOSIS — G20C Parkinsonism, unspecified: Secondary | ICD-10-CM | POA: Diagnosis not present

## 2022-08-27 ENCOUNTER — Other Ambulatory Visit: Payer: Self-pay | Admitting: Family Medicine

## 2022-08-27 DIAGNOSIS — Z1231 Encounter for screening mammogram for malignant neoplasm of breast: Secondary | ICD-10-CM

## 2022-08-31 ENCOUNTER — Ambulatory Visit
Admission: RE | Admit: 2022-08-31 | Discharge: 2022-08-31 | Disposition: A | Discharge status: Home / Self Care | Payer: Medicare Other | Source: Ambulatory Visit | Attending: Family Medicine | Admitting: Family Medicine

## 2022-08-31 DIAGNOSIS — Z1231 Encounter for screening mammogram for malignant neoplasm of breast: Secondary | ICD-10-CM

## 2022-09-03 DIAGNOSIS — R531 Weakness: Secondary | ICD-10-CM | POA: Diagnosis not present

## 2022-09-03 DIAGNOSIS — R296 Repeated falls: Secondary | ICD-10-CM | POA: Diagnosis not present

## 2022-09-03 DIAGNOSIS — R262 Difficulty in walking, not elsewhere classified: Secondary | ICD-10-CM | POA: Diagnosis not present

## 2022-09-03 DIAGNOSIS — G20C Parkinsonism, unspecified: Secondary | ICD-10-CM | POA: Diagnosis not present

## 2022-09-05 DIAGNOSIS — R262 Difficulty in walking, not elsewhere classified: Secondary | ICD-10-CM | POA: Diagnosis not present

## 2022-09-05 DIAGNOSIS — G20C Parkinsonism, unspecified: Secondary | ICD-10-CM | POA: Diagnosis not present

## 2022-09-05 DIAGNOSIS — R296 Repeated falls: Secondary | ICD-10-CM | POA: Diagnosis not present

## 2022-09-05 DIAGNOSIS — R531 Weakness: Secondary | ICD-10-CM | POA: Diagnosis not present

## 2022-09-12 DIAGNOSIS — G20C Parkinsonism, unspecified: Secondary | ICD-10-CM | POA: Diagnosis not present

## 2022-09-12 DIAGNOSIS — R262 Difficulty in walking, not elsewhere classified: Secondary | ICD-10-CM | POA: Diagnosis not present

## 2022-09-12 DIAGNOSIS — R531 Weakness: Secondary | ICD-10-CM | POA: Diagnosis not present

## 2022-09-12 DIAGNOSIS — R296 Repeated falls: Secondary | ICD-10-CM | POA: Diagnosis not present

## 2022-09-14 DIAGNOSIS — G20C Parkinsonism, unspecified: Secondary | ICD-10-CM | POA: Diagnosis not present

## 2022-09-14 DIAGNOSIS — R296 Repeated falls: Secondary | ICD-10-CM | POA: Diagnosis not present

## 2022-09-14 DIAGNOSIS — R531 Weakness: Secondary | ICD-10-CM | POA: Diagnosis not present

## 2022-09-14 DIAGNOSIS — R262 Difficulty in walking, not elsewhere classified: Secondary | ICD-10-CM | POA: Diagnosis not present

## 2022-09-19 DIAGNOSIS — R262 Difficulty in walking, not elsewhere classified: Secondary | ICD-10-CM | POA: Diagnosis not present

## 2022-09-19 DIAGNOSIS — G20C Parkinsonism, unspecified: Secondary | ICD-10-CM | POA: Diagnosis not present

## 2022-09-19 DIAGNOSIS — R531 Weakness: Secondary | ICD-10-CM | POA: Diagnosis not present

## 2022-09-19 DIAGNOSIS — R296 Repeated falls: Secondary | ICD-10-CM | POA: Diagnosis not present

## 2022-09-21 DIAGNOSIS — R262 Difficulty in walking, not elsewhere classified: Secondary | ICD-10-CM | POA: Diagnosis not present

## 2022-09-21 DIAGNOSIS — R531 Weakness: Secondary | ICD-10-CM | POA: Diagnosis not present

## 2022-09-21 DIAGNOSIS — G20C Parkinsonism, unspecified: Secondary | ICD-10-CM | POA: Diagnosis not present

## 2022-09-21 DIAGNOSIS — R296 Repeated falls: Secondary | ICD-10-CM | POA: Diagnosis not present

## 2022-09-24 ENCOUNTER — Encounter: Payer: Self-pay | Admitting: Internal Medicine

## 2022-09-24 ENCOUNTER — Encounter: Payer: Self-pay | Admitting: Gastroenterology

## 2022-09-24 ENCOUNTER — Ambulatory Visit: Payer: Medicare Other | Admitting: Gastroenterology

## 2022-09-24 ENCOUNTER — Other Ambulatory Visit: Payer: Self-pay

## 2022-09-24 ENCOUNTER — Other Ambulatory Visit: Payer: Self-pay | Admitting: Family Medicine

## 2022-09-24 VITALS — BP 138/79 | HR 88 | Temp 97.9°F | Ht 60.0 in | Wt 207.0 lb

## 2022-09-24 DIAGNOSIS — D508 Other iron deficiency anemias: Secondary | ICD-10-CM

## 2022-09-24 DIAGNOSIS — E039 Hypothyroidism, unspecified: Secondary | ICD-10-CM

## 2022-09-24 DIAGNOSIS — E785 Hyperlipidemia, unspecified: Secondary | ICD-10-CM

## 2022-09-24 DIAGNOSIS — I10 Essential (primary) hypertension: Secondary | ICD-10-CM

## 2022-09-24 MED ORDER — NA SULFATE-K SULFATE-MG SULF 17.5-3.13-1.6 GM/177ML PO SOLN: 354.0000 mL | Freq: Once | ORAL | 0 refills | Status: AC

## 2022-09-24 NOTE — Progress Notes (Signed)
Wyline Mood MD, MRCP(U.K) [address]  [address]  [place], [place] [address]  Main: [phone]  Fax: [phone]   Gastroenterology Consultation  Referring Provider:    Dr. Alena Bills Primary Care Physician:  Malva Limes, MD Primary Gastroenterologist:  Dr. Wyline Mood  Reason for Consultation:    Iron deficiency anemia        HPI:   Brittany Benton is a 67 y.o. y/o female referred for consultation & management  by Dr. Alena Bills for iron deficiency anemia.   She has been referred for iron deficiency anemia by Dr. Alena Bills in February 2024.  History of gastric bypass.  RCC s/p nephrectomy 1994.  Having iron infusions.  Has iron and B12 deficiency on replacement monitored by hematology.  03/30/2022 ferritin of 7.  TSH normal.  Hemoglobin 10 with an MCV of 68, B12 123 Urine analysis was negative for blood  06/11/2022 ferritin 85. 02/12/2011 colonoscopy was reported as normal.  Denies any blood in the urine denies any blood in the stool denies any rectal bleeding denies any hematemesis denies any vaginal bleeding denies any NSAID use.  Gained 50 pounds of weight complains a lot of gas bloating and very soft stools.  Consumes 1 gallon of Crystal light a day no other artificial sugars or sweeteners.  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   GERD (gastroesophageal reflux disease)    Hernia, incisional    after renal surgery   History of chicken pox    Hyperlipidemia    Renal cell carcinoma (HCC)    s/p right nephrectomy 1994   Sleep apnea     Past Surgical History:  Procedure Laterality Date   APPENDECTOMY  1994   BREAST BIOPSY Right 1991   Negative   CHOLECYSTECTOMY  1994   LAPAROSCOPIC GASTRIC SLEEVE RESECTION  02/02/2016   Dr Alva Garnet at [facility]   NEPHRECTOMY  1994   Renal Cell Carcinoma   parotid gland removal  1990   also removed a tumor   TONSILLECTOMY      TOTAL HIP ARTHROPLASTY Right 08/08/2016   Procedure: TOTAL HIP ARTHROPLASTY;  Surgeon: Deeann Saint, MD;  Location: [facility] ORS;  Service: Orthopedics;  Laterality: Right;    Prior to Admission medications   Medication Sig Start Date End Date Taking? Authorizing Provider  Ascorbic Acid (VITAMIN C PO) Take by mouth.    [provider]  atorvastatin (LIPITOR) 20 MG tablet TAKE 1 TABLET BY MOUTH EVERY EVENING 07/26/21   Malva Limes, MD  Cholecalciferol (VITAMIN D-3) 1000 units CAPS Take 1 capsule by mouth daily.    [provider]  HYDROcodone-acetaminophen (NORCO/VICODIN) 5-325 MG tablet Take 1 tablet by mouth every 4 (four) hours as needed.    [provider]  imipramine (TOFRANIL) 50 MG tablet TAKE ONE TABLET BY MOUTH TWICE DAILY 01/30/22   Malva Limes, MD  levothyroxine (SYNTHROID) 50 MCG tablet TAKE ONE TABLET BY MOUTH EVERY DAY 07/26/21   Malva Limes, MD  metFORMIN (GLUCOPHAGE-XR) 500 MG 24 hr tablet Take 500 mg by mouth daily with breakfast.    [provider]  metoprolol succinate (TOPROL-XL) 50 MG 24 hr tablet TAKE ONE TABLET BY MOUTH EVERY DAY WITH OR IMMEDIATELY FOLLOWING A MEAL 07/26/21   Malva Limes, MD  Multiple Vitamins-Minerals (MULTIVITAMIN ADULT PO) Take 1 tablet by mouth daily. Patient not taking:  Reported on 06/11/2022 07/16/07   [provider]  mupirocin ointment (BACTROBAN) 2 % Apply 1 Application topically 3 (three) times daily.    [provider]  omeprazole (PRILOSEC OTC) 20 MG tablet Take 20 mg by mouth daily. 02/24/16   [provider]  omeprazole (PRILOSEC) 20 MG capsule TAKE 1 CAPSULE BY MOUTH ONCE DAILY 07/26/21   Malva Limes, MD  sertraline (ZOLOFT) 100 MG tablet TAKE ONE TABLET BY MOUTH DAILY 07/25/22   Malva Limes, MD    Family History  Problem Relation Age of Onset   Osteoporosis Mother    Dementia Mother    Heart disease Father    Heart attack Father    Breast cancer Maternal  Aunt      Social History   Tobacco Use   Smoking status: Never   Smokeless tobacco: Never  Substance Use Topics   Alcohol use: Not Currently    Alcohol/week: 0.0 standard drinks of alcohol   Drug use: No    Allergies as of 09/24/2022 - Review Complete 09/24/2022  Allergen Reaction Noted   Codeine Nausea And Vomiting and Other (See Comments) 09/22/2014   Morphine Hives, Swelling, and Other (See Comments) 09/22/2014   Tape Other (See Comments) 02/03/2016   Tramadol Other (See Comments) 06/15/2016    Review of Systems:    All systems reviewed and negative except where noted in HPI.   Physical Exam:  BP 138/79   Pulse 88   Temp 97.9 F (36.6 C) (Oral)   Ht 5' (1.524 m)   Wt 207 lb (93.9 kg)   BMI 40.43 kg/m  No LMP recorded. Patient is postmenopausal. Psych:  Alert and cooperative. Normal mood and affect. General:   Alert,  Well-developed, well-nourished, pleasant and cooperative in NAD Head:  Normocephalic and atraumatic. Eyes:  Sclera clear, no icterus.   Conjunctiva pink. Ears:  Normal auditory acuity. Neurologic:  Alert and oriented x3;  grossly normal neurologically. Psych:  Alert and cooperative. Normal mood and affect.  Imaging Studies: MM 3D SCREENING MAMMOGRAM BILATERAL BREAST  Result Date: 09/04/2022 CLINICAL DATA:  Screening. EXAM: DIGITAL SCREENING BILATERAL MAMMOGRAM WITH TOMOSYNTHESIS AND CAD TECHNIQUE: Bilateral screening digital craniocaudal and mediolateral oblique mammograms were obtained. Bilateral screening digital breast tomosynthesis was performed. The images were evaluated with computer-aided detection. COMPARISON:  Previous exam(s). ACR Breast Density Category a: The breasts are almost entirely fatty. FINDINGS: There are no findings suspicious for malignancy. IMPRESSION: No mammographic evidence of malignancy. A result letter of this screening mammogram will be mailed directly to the patient. RECOMMENDATION: Screening mammogram in one year.  (Code:SM-B-01Y) BI-RADS CATEGORY  1: Negative. Electronically Signed   By: Norva Pavlov M.D.   On: 09/04/2022 09:47    Assessment and Plan:   Brittany Benton is a 67 y.o. y/o female has been referred for iron deficiency anemia history of gastric bypass as concurrent B12 deficiency as well.  Very likely iron deficiency due to gastric bypass.  She has gas and bloating which is very likely due to excess consumption of Crystal light which can cause an osmotic diarrhea.  Advised her to stop all artificial sugars and sweeteners and consume water for a week or 2 to determine if she feels better.  Plan 1.  Celiac serology,H. pylori breath test 2.  EGD and colonoscopy   I have discussed alternative options, risks & benefits,  which include, but are not limited to, bleeding, infection, perforation,respiratory complication & drug reaction.  The patient agrees with  this plan & written consent will be obtained.     Follow up in 8 to 12 weeks with PA  Dr Wyline Mood MD,MRCP(U.K)

## 2022-09-25 LAB — CELIAC DISEASE AB SCREEN W/RFX: IgA/Immunoglobulin A, Serum: 196 mg/dL (ref 87–352)

## 2022-09-26 DIAGNOSIS — R296 Repeated falls: Secondary | ICD-10-CM | POA: Diagnosis not present

## 2022-09-26 DIAGNOSIS — G20C Parkinsonism, unspecified: Secondary | ICD-10-CM | POA: Diagnosis not present

## 2022-09-26 DIAGNOSIS — R531 Weakness: Secondary | ICD-10-CM | POA: Diagnosis not present

## 2022-09-26 DIAGNOSIS — R262 Difficulty in walking, not elsewhere classified: Secondary | ICD-10-CM | POA: Diagnosis not present

## 2022-09-26 LAB — H. PYLORI BREATH TEST: H pylori Breath Test: NEGATIVE

## 2022-09-26 LAB — CELIAC DISEASE AB SCREEN W/RFX
Antigliadin Abs, IgA: 5 units (ref 0–19)
Transglutaminase IgA: <2 U/mL (ref 0–3)

## 2022-09-28 DIAGNOSIS — G20C Parkinsonism, unspecified: Secondary | ICD-10-CM | POA: Diagnosis not present

## 2022-09-28 DIAGNOSIS — R296 Repeated falls: Secondary | ICD-10-CM | POA: Diagnosis not present

## 2022-09-28 DIAGNOSIS — R531 Weakness: Secondary | ICD-10-CM | POA: Diagnosis not present

## 2022-09-28 DIAGNOSIS — R262 Difficulty in walking, not elsewhere classified: Secondary | ICD-10-CM | POA: Diagnosis not present

## 2022-10-01 ENCOUNTER — Ambulatory Visit: Payer: Self-pay

## 2022-10-01 NOTE — Patient Outreach (Signed)
  Care Coordination   10/01/2022 Name: KAMREY STEIMLE MRN: [id number] DOB: Jun 09, 1955   Care Coordination Outreach Attempts:  An unsuccessful telephone outreach was attempted today to offer the patient information about available care coordination services.  Follow Up Plan:  Additional outreach attempts will be made to offer the patient care coordination information and services.   Encounter Outcome:  No Answer   Care Coordination Interventions:  No, not indicated    SIG Lysle Morales, BSW Social Worker [facility] Care Management  [phone]

## 2022-10-05 DIAGNOSIS — R531 Weakness: Secondary | ICD-10-CM | POA: Diagnosis not present

## 2022-10-05 DIAGNOSIS — R262 Difficulty in walking, not elsewhere classified: Secondary | ICD-10-CM | POA: Diagnosis not present

## 2022-10-05 DIAGNOSIS — R296 Repeated falls: Secondary | ICD-10-CM | POA: Diagnosis not present

## 2022-10-05 DIAGNOSIS — G20C Parkinsonism, unspecified: Secondary | ICD-10-CM | POA: Diagnosis not present

## 2022-10-08 ENCOUNTER — Other Ambulatory Visit: Payer: Self-pay

## 2022-10-08 ENCOUNTER — Encounter: Payer: Self-pay | Admitting: Internal Medicine

## 2022-10-08 ENCOUNTER — Inpatient Hospital Stay (HOSPITAL_BASED_OUTPATIENT_CLINIC_OR_DEPARTMENT_OTHER): Payer: Medicare Other | Admitting: Internal Medicine

## 2022-10-08 ENCOUNTER — Inpatient Hospital Stay: Payer: Medicare Other | Attending: Internal Medicine

## 2022-10-08 VITALS — BP 116/75 | HR 80 | Temp 98.7°F | Wt 206.1 lb

## 2022-10-08 DIAGNOSIS — Z9884 Bariatric surgery status: Secondary | ICD-10-CM | POA: Insufficient documentation

## 2022-10-08 DIAGNOSIS — E538 Deficiency of other specified B group vitamins: Secondary | ICD-10-CM | POA: Insufficient documentation

## 2022-10-08 DIAGNOSIS — E039 Hypothyroidism, unspecified: Secondary | ICD-10-CM

## 2022-10-08 DIAGNOSIS — D509 Iron deficiency anemia, unspecified: Secondary | ICD-10-CM | POA: Insufficient documentation

## 2022-10-08 LAB — CBC WITH DIFFERENTIAL/PLATELET
Abs Immature Granulocytes: 0.05 10*3/uL (ref 0.00–0.07)
Basophils Absolute: 0.1 10*3/uL (ref 0.0–0.1)
Basophils Relative: 1 %
Eosinophils Absolute: 0.2 10*3/uL (ref 0.0–0.5)
Eosinophils Relative: 2 %
HCT: 45.9 % (ref 36.0–46.0)
Hemoglobin: 14.7 g/dL (ref 12.0–15.0)
Immature Granulocytes: 1 %
Lymphocytes Relative: 29 %
Lymphs Abs: 1.9 10*3/uL (ref 0.7–4.0)
MCH: 27.1 pg (ref 26.0–34.0)
MCHC: 32 g/dL (ref 30.0–36.0)
MCV: 84.7 fL (ref 80.0–100.0)
Monocytes Absolute: 0.8 10*3/uL (ref 0.1–1.0)
Monocytes Relative: 12 %
Neutro Abs: 3.6 10*3/uL (ref 1.7–7.7)
Neutrophils Relative %: 55 %
Platelets: 301 10*3/uL (ref 150–400)
RBC: 5.42 MIL/uL — ABNORMAL HIGH (ref 3.87–5.11)
RDW: 13 % (ref 11.5–15.5)
WBC: 6.6 10*3/uL (ref 4.0–10.5)
nRBC: 0 % (ref 0.0–0.2)

## 2022-10-08 LAB — IRON AND TIBC
Iron: 90 ug/dL (ref 28–170)
Saturation Ratios: 21 % (ref 10.4–31.8)
TIBC: 431 ug/dL (ref 250–450)
UIBC: 341 ug/dL

## 2022-10-08 LAB — TSH: TSH: 2.409 u[IU]/mL (ref 0.350–4.500)

## 2022-10-08 LAB — FOLATE: Folate: 21.9 ng/mL (ref >5.9)

## 2022-10-08 LAB — VITAMIN B12: Vitamin B-12: 149 pg/mL — ABNORMAL LOW (ref 180–914)

## 2022-10-08 LAB — FERRITIN: Ferritin: 27 ng/mL (ref 11–307)

## 2022-10-08 NOTE — Progress Notes (Signed)
Patient has no appetite and when she does feel like eating she only wants sweets. She is also wanting to go over her weight gain.

## 2022-10-08 NOTE — Progress Notes (Signed)
[facility]  [phone] Fax:([phone]) [phone]  ID: Brittany Benton OB: 1955/09/12  MR#: [id number]  VOZ#:[id number]  Patient Care Team: Malva Limes, MD as PCP - General (Family Medicine) Mariah Milling Tollie Pizza, MD as Consulting Physician (Cardiology) Deeann Saint, MD (Specialist) Pa, [facility]) Venetia Night, MD as Consulting Physician (Neurosurgery) Lonell Face, MD as Consulting Physician (Neurology)  HPI: Brittany Benton is a 68 y.o. female with past medical history of diabetes, hyperlipidemia, Parkinson's, GERD, osteoarthritis, OSA, gastric bypass surgery, RCC status post right nephrectomy in 1994 was referred to hematology for iron deficiency anemia.  Patient has a history of gastric bypass surgery.  Patient reports that in the spring 2023 she was admitted for failure to thrive and then was discharged to rehab facility.  Today her her food intake improved and with physical activity her strength got better.  She was recently diagnosed with Parkinson's disease and follows with Dr. Sherryll Burger.  In past 1 month, she noticed low energy levels and shortness of breath on exertion.  Denies any dizziness.  Denies any black tarry stools, use of NSAIDs.  No prior history of anemia.  Labs from 03/30/2022 showed ferritin 7.  Vitamin A and B1 normal.  TSH normal.  UA negative.  Last colonoscopy was in 2012 which showed internal hemorrhoids.   Completed IV Feraheme x 2 doses in January 2024.  B12 injection 1000 mcg x 4 doses.   INTERVAL HISTORY-  Patient was seen today as follow-up for labs and iron deficiency anemia and B12 deficiency.. She reports decreased appetite and cravings for sweet food.  Gained about 10 pounds in past 1 year.  Feels full easily.  He is scheduled for colonoscopy and endoscopy with Dr. Tobi Bastos next month.  Denies any bleeding in urine or stool.  Following with Dr. Sherryll Burger for her tremors.  REVIEW OF SYSTEMS:   ROS  As per HPI.  Otherwise, a complete review of systems is negative.  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   GERD (gastroesophageal reflux disease)    Hernia, incisional    after renal surgery   History of chicken pox    Hyperlipidemia    Renal cell carcinoma (HCC)    s/p right nephrectomy 1994   Sleep apnea     PAST SURGICAL HISTORY: Past Surgical History:  Procedure Laterality Date   APPENDECTOMY  1994   BREAST BIOPSY Right 1991   Negative   CHOLECYSTECTOMY  1994   LAPAROSCOPIC GASTRIC SLEEVE RESECTION  02/02/2016   Dr Alva Garnet at [facility]   NEPHRECTOMY  1994   Renal Cell Carcinoma   parotid gland removal  1990   also removed a tumor   TONSILLECTOMY     TOTAL HIP ARTHROPLASTY Right 08/08/2016   Procedure: TOTAL HIP ARTHROPLASTY;  Surgeon: Deeann Saint, MD;  Location: [facility] ORS;  Service: Orthopedics;  Laterality: Right;    FAMILY HISTORY: Family History  Problem Relation Age of Onset   Osteoporosis Mother    Dementia Mother    Heart disease Father    Heart attack Father    Breast cancer Maternal Aunt     HEALTH MAINTENANCE: Social History   Tobacco Use   Smoking status: Never   Smokeless tobacco: Never  Substance Use Topics   Alcohol use: Not Currently    Alcohol/week: 0.0 standard drinks of alcohol  Drug use: No     Allergies  Allergen Reactions   Codeine Nausea And Vomiting and Other (See Comments)   Morphine Hives, Swelling and Other (See Comments)   Tape Other (See Comments)    blisters blisters   Tramadol Other (See Comments)    Hallucinations    Current Outpatient Medications  Medication Sig Dispense Refill   Ascorbic Acid (VITAMIN C PO) Take by mouth.     atorvastatin (LIPITOR) 20 MG tablet TAKE 1 TABLET BY MOUTH EVERY EVENING 90 tablet 0   Cholecalciferol (VITAMIN D-3) 1000 units CAPS Take 1 capsule by mouth daily.     imipramine  (TOFRANIL) 50 MG tablet TAKE ONE TABLET BY MOUTH TWICE DAILY 180 tablet 3   levothyroxine (SYNTHROID) 50 MCG tablet TAKE ONE TABLET BY MOUTH EVERY DAY 90 tablet 0   metoprolol succinate (TOPROL-XL) 50 MG 24 hr tablet TAKE ONE TABLET BY MOUTH EVERY DAY WITH OR IMMEDIATELY FOLLOWING A MEAL 90 tablet 0   omeprazole (PRILOSEC OTC) 20 MG tablet Take 20 mg by mouth daily.     omeprazole (PRILOSEC) 20 MG capsule TAKE 1 CAPSULE BY MOUTH ONCE DAILY 90 capsule 0   sertraline (ZOLOFT) 100 MG tablet TAKE ONE TABLET BY MOUTH DAILY 90 tablet 3   Multiple Vitamins-Minerals (MULTIVITAMIN ADULT PO) Take 1 tablet by mouth daily. (Patient not taking: Reported on 10/08/2022)     mupirocin ointment (BACTROBAN) 2 % Apply 1 Application topically 3 (three) times daily. (Patient not taking: Reported on 09/24/2022)     No current facility-administered medications for this visit.    OBJECTIVE: Vitals:   10/08/22 1058 10/08/22 1105  BP: (!) 159/131 116/75  Pulse: 80   Temp: 98.7 F (37.1 C)   SpO2: 100%      Body mass index is 40.25 kg/m.      General: Well-developed, well-nourished, no acute distress. Eyes: Pink conjunctiva, anicteric sclera. HEENT: Normocephalic, moist mucous membranes, clear oropharnyx. Lungs: Clear to auscultation bilaterally. Heart: Regular rate and rhythm. No rubs, murmurs, or gallops. Abdomen: Soft, nontender, nondistended. No organomegaly noted, normoactive bowel sounds. Musculoskeletal: No edema, cyanosis, or clubbing. Neuro: Alert, answering all questions appropriately. Cranial nerves grossly intact. Skin: No rashes or petechiae noted. Psych: Normal affect. Lymphatics: No cervical, calvicular, axillary or inguinal LAD.   LAB RESULTS:  Lab Results  Component Value Date   NA 143 09/05/2021   K 4.1 09/05/2021   CL 108 (H) 09/05/2021   CO2 20 09/05/2021   GLUCOSE 98 09/05/2021   BUN 8 09/05/2021   CREATININE 0.82 09/05/2021   CALCIUM 8.8 09/05/2021   PROT 5.8 (L) 09/05/2021    ALBUMIN 3.5 (L) 09/05/2021   AST 24 09/05/2021   ALT 10 09/05/2021   ALKPHOS 90 09/05/2021   BILITOT 0.4 09/05/2021   GFRNONAA >60 08/06/2021   GFRAA 60 05/15/2019    Lab Results  Component Value Date   WBC 6.6 10/08/2022   NEUTROABS 3.6 10/08/2022   HGB 14.7 10/08/2022   HCT 45.9 10/08/2022   MCV 84.7 10/08/2022   PLT 301 10/08/2022    Lab Results  Component Value Date   TIBC 431 10/08/2022   TIBC 356 06/11/2022   TIBC 459 (H) 03/30/2022   FERRITIN 27 10/08/2022   FERRITIN 85 06/11/2022   FERRITIN 7 (L) 03/30/2022   IRONPCTSAT 21 10/08/2022   IRONPCTSAT 23 06/11/2022   IRONPCTSAT 5 (LL) 03/30/2022     STUDIES: No results found.  ASSESSMENT AND PLAN:   Brittany Benton  is a 67 y.o. female with pmh of diabetes, hyperlipidemia, Parkinson's, GERD, osteoarthritis, OSA, gastric bypass surgery, RCC status post right nephrectomy in 1994 was referred to hematology for iron deficiency anemia.  # Iron deficiency anemia # History of gastric bypass surgery -Labs from 03/30/2022 showed ferritin 7. UA negative.  Received IV Feraheme x 2 in January 2024.  Tolerated well.  Hemoglobin today is 14.7.  Iron level is pending. -Scheduled for colonoscopy and endoscopy with Dr. Tobi Bastos in July 2024.  Reports decreased appetite, early satiety, cravings for sweet food.  # B12 deficiency -Secondary to gastric bypass surgery.  Received B12 injection weekly x 4. -Advised to start maintenance oral B12 1000 mcg daily.  B12 level pending today  # Meningioma  - 1.1 cm meningioma, status post neurosurgery evaluation  # Parkinsonism # Cognition impairment -Follows with Dr. Sherryll Burger neurology.  Was previously on carbidopa levodopa.  Per patient, she has discontinued.  # Hypothyroidism -On Synthroid  # Depression -On sertraline  # Weight gain -Will add on TSH and T4 level  Orders Placed This Encounter  Procedures   TSH   T4   RTC in 6 months for MD visit, labs  Patient expressed  understanding and was in agreement with this plan. She also understands that She can call clinic at any time with any questions, concerns, or complaints.   I spent a total of 25 minutes reviewing chart data, face-to-face evaluation with the patient, counseling and coordination of care as detailed above.  Michaelyn Barter, MD   10/08/2022 2:48 PM

## 2022-10-08 NOTE — Addendum Note (Signed)
Addended byMichaelyn Barter on: 10/08/2022 03:52 PM   Modules accepted: Orders

## 2022-10-09 LAB — T4: T4, Total: 10.8 ug/dL (ref 4.5–12.0)

## 2022-10-17 ENCOUNTER — Inpatient Hospital Stay: Payer: Medicare Other | Attending: Internal Medicine

## 2022-10-17 DIAGNOSIS — E538 Deficiency of other specified B group vitamins: Secondary | ICD-10-CM | POA: Diagnosis not present

## 2022-10-17 MED ORDER — CYANOCOBALAMIN 1000 MCG/ML IJ SOLN
1000.0000 ug | Freq: Once | INTRAMUSCULAR | Status: AC
  Administered 2022-10-17: 1000 ug via INTRAMUSCULAR

## 2022-10-19 ENCOUNTER — Ambulatory Visit: Payer: Self-pay

## 2022-10-19 NOTE — Patient Outreach (Signed)
  Care Coordination   Initial Visit Note   10/19/2022 Name: Brittany Benton MRN: [id number] DOB: October 25, 1955  Brittany Benton is a 67 y.o. year old female who sees Fisher, Demetrios Isaacs, MD for primary care. I spoke with  Brittany Benton by phone today.  What matters to the patients health and wellness today?  CM educated patient on [facility] services.  Patient declines services and feels that she is able to manage her medical needs.  Patient agreed to contact her provider in the future if she needs [facility] services.    Goals Addressed   None     SDOH assessments and interventions completed:  No     Care Coordination Interventions:  No, not indicated   Follow up plan: No further intervention required.   Encounter Outcome:  Pt. Refused

## 2022-10-24 ENCOUNTER — Other Ambulatory Visit: Payer: Self-pay | Admitting: Family Medicine

## 2022-10-24 DIAGNOSIS — E039 Hypothyroidism, unspecified: Secondary | ICD-10-CM

## 2022-10-24 DIAGNOSIS — I10 Essential (primary) hypertension: Secondary | ICD-10-CM

## 2022-10-24 DIAGNOSIS — E785 Hyperlipidemia, unspecified: Secondary | ICD-10-CM

## 2022-10-31 ENCOUNTER — Encounter: Payer: Self-pay | Admitting: Gastroenterology

## 2022-11-06 ENCOUNTER — Encounter: Payer: Self-pay | Admitting: Gastroenterology

## 2022-11-07 ENCOUNTER — Ambulatory Visit
Admission: RE | Admit: 2022-11-07 | Discharge: 2022-11-07 | Disposition: A | Discharge status: Home / Self Care | Payer: Medicare Other | Attending: Gastroenterology | Admitting: Gastroenterology

## 2022-11-07 ENCOUNTER — Ambulatory Visit: Payer: Medicare Other

## 2022-11-07 ENCOUNTER — Encounter
Admission: RE | Disposition: A | Discharge status: Home / Self Care | Payer: Self-pay | Source: Home / Self Care | Attending: Gastroenterology

## 2022-11-07 DIAGNOSIS — I10 Essential (primary) hypertension: Secondary | ICD-10-CM | POA: Diagnosis not present

## 2022-11-07 DIAGNOSIS — K2960 Other gastritis without bleeding: Secondary | ICD-10-CM | POA: Insufficient documentation

## 2022-11-07 DIAGNOSIS — G20A1 Parkinson's disease without dyskinesia, without mention of fluctuations: Secondary | ICD-10-CM | POA: Diagnosis not present

## 2022-11-07 DIAGNOSIS — E11620 Type 2 diabetes mellitus with diabetic dermatitis: Secondary | ICD-10-CM | POA: Insufficient documentation

## 2022-11-07 DIAGNOSIS — G4730 Sleep apnea, unspecified: Secondary | ICD-10-CM | POA: Insufficient documentation

## 2022-11-07 DIAGNOSIS — K2970 Gastritis, unspecified, without bleeding: Secondary | ICD-10-CM | POA: Diagnosis not present

## 2022-11-07 DIAGNOSIS — K3189 Other diseases of stomach and duodenum: Secondary | ICD-10-CM | POA: Diagnosis not present

## 2022-11-07 DIAGNOSIS — K219 Gastro-esophageal reflux disease without esophagitis: Secondary | ICD-10-CM | POA: Insufficient documentation

## 2022-11-07 DIAGNOSIS — Z79890 Hormone replacement therapy (postmenopausal): Secondary | ICD-10-CM | POA: Insufficient documentation

## 2022-11-07 DIAGNOSIS — D122 Benign neoplasm of ascending colon: Secondary | ICD-10-CM

## 2022-11-07 DIAGNOSIS — D509 Iron deficiency anemia, unspecified: Secondary | ICD-10-CM | POA: Diagnosis not present

## 2022-11-07 DIAGNOSIS — E785 Hyperlipidemia, unspecified: Secondary | ICD-10-CM | POA: Diagnosis not present

## 2022-11-07 DIAGNOSIS — F419 Anxiety disorder, unspecified: Secondary | ICD-10-CM | POA: Diagnosis not present

## 2022-11-07 DIAGNOSIS — Z9884 Bariatric surgery status: Secondary | ICD-10-CM | POA: Diagnosis not present

## 2022-11-07 DIAGNOSIS — Z905 Acquired absence of kidney: Secondary | ICD-10-CM | POA: Insufficient documentation

## 2022-11-07 DIAGNOSIS — Z85528 Personal history of other malignant neoplasm of kidney: Secondary | ICD-10-CM | POA: Insufficient documentation

## 2022-11-07 DIAGNOSIS — Z79899 Other long term (current) drug therapy: Secondary | ICD-10-CM | POA: Diagnosis not present

## 2022-11-07 DIAGNOSIS — D123 Benign neoplasm of transverse colon: Secondary | ICD-10-CM | POA: Diagnosis not present

## 2022-11-07 DIAGNOSIS — K635 Polyp of colon: Secondary | ICD-10-CM | POA: Insufficient documentation

## 2022-11-07 DIAGNOSIS — D508 Other iron deficiency anemias: Secondary | ICD-10-CM | POA: Diagnosis not present

## 2022-11-07 HISTORY — PX: ESOPHAGOGASTRODUODENOSCOPY (EGD) WITH PROPOFOL: SHX5813

## 2022-11-07 HISTORY — PX: COLONOSCOPY WITH PROPOFOL: SHX5780

## 2022-11-07 HISTORY — PX: BIOPSY: SHX5522

## 2022-11-07 HISTORY — PX: POLYPECTOMY: SHX149

## 2022-11-07 SURGERY — COLONOSCOPY WITH PROPOFOL
Anesthesia: General

## 2022-11-07 MED ORDER — GLYCOPYRROLATE 0.2 MG/ML IJ SOLN
INTRAMUSCULAR | Status: DC | PRN
  Administered 2022-11-07: .1 mg via INTRAVENOUS

## 2022-11-07 MED ORDER — LIDOCAINE HCL (CARDIAC) PF 100 MG/5ML IV SOSY
PREFILLED_SYRINGE | INTRAVENOUS | Status: DC | PRN
  Administered 2022-11-07: 40 mg via INTRAVENOUS

## 2022-11-07 MED ORDER — SODIUM CHLORIDE 0.9 % IV SOLN
INTRAVENOUS | Status: DC
  Administered 2022-11-07: 20 mL/h via INTRAVENOUS

## 2022-11-07 MED ORDER — PROPOFOL 10 MG/ML IV BOLUS
INTRAVENOUS | Status: DC | PRN
  Administered 2022-11-07: 50 mg via INTRAVENOUS

## 2022-11-07 MED ORDER — PROPOFOL 500 MG/50ML IV EMUL
INTRAVENOUS | Status: DC | PRN
  Administered 2022-11-07: 125 ug/kg/min via INTRAVENOUS

## 2022-11-07 NOTE — Op Note (Signed)
[facility] Gastroenterology Patient Name: Brittany Benton Procedure Date: 11/07/2022 10:25 AM MRN: [id number] Account #: [id number] Date of Birth: Oct 07, 1955 Admit Type: Outpatient Age: 67 Room: [facility] ENDO ROOM 2 Gender: Female Note Status: Finalized Instrument Name: Upper Endoscope [id number] Procedure:             Upper GI endoscopy Indications:           Iron deficiency anemia Providers:             Wyline Mood MD, MD Referring MD:          Demetrios Isaacs. Sherrie Mustache, MD (Referring MD) Medicines:             Monitored Anesthesia Care Complications:         No immediate complications. Procedure:             Pre-Anesthesia Assessment:                        - Prior to the procedure, a History and Physical was                         performed, and patient medications, allergies and                         sensitivities were reviewed. The patient's tolerance                         of previous anesthesia was reviewed.                        - The risks and benefits of the procedure and the                         sedation options and risks were discussed with the                         patient. All questions were answered and informed                         consent was obtained.                        - ASA Grade Assessment: II - A patient with mild                         systemic disease.                        After obtaining informed consent, the endoscope was                         passed under direct vision. Throughout the procedure,                         the patient's blood pressure, pulse, and oxygen                         saturations were monitored continuously. The Endoscope  was introduced through the mouth, and advanced to the                         third part of duodenum. The upper GI endoscopy was                         accomplished with ease. The patient tolerated the                         procedure well. Findings:      The  esophagus was normal.      The examined duodenum was normal.      Diffuse severe inflammation characterized by congestion (edema),       erythema and mucus was found in the entire examined stomach. Biopsies       were taken with a cold forceps for histology.      The cardia and gastric fundus were normal on retroflexion. Impression:            - Normal esophagus.                        - Normal examined duodenum.                        - Gastritis. Biopsied. Recommendation:        - Await pathology results.                        - Perform a colonoscopy today. Procedure Code(s):     --- Professional ---                        [phone], Esophagogastroduodenoscopy, flexible,                         transoral; with biopsy, single or multiple Diagnosis Code(s):     --- Professional ---                        K29.70, Gastritis, unspecified, without bleeding                        D50.9, Iron deficiency anemia, unspecified CPT copyright 2022 American Medical Association. All rights reserved. The codes documented in this report are preliminary and upon coder review may  be revised to meet current compliance requirements. Wyline Mood, MD Wyline Mood MD, MD 11/07/2022 10:41:21 AM This report has been signed electronically. Number of Addenda: 0 Note Initiated On: 11/07/2022 10:25 AM Estimated Blood Loss:  Estimated blood loss: none.      [facility]

## 2022-11-07 NOTE — Anesthesia Postprocedure Evaluation (Signed)
Anesthesia Post Note  Patient: VIRA CHAPLIN  Procedure(s) Performed: COLONOSCOPY WITH PROPOFOL ESOPHAGOGASTRODUODENOSCOPY (EGD) WITH PROPOFOL POLYPECTOMY INTESTINAL  Patient location during evaluation: Endoscopy Anesthesia Type: General Level of consciousness: awake and alert Pain management: pain level controlled Vital Signs Assessment: post-procedure vital signs reviewed and stable Respiratory status: spontaneous breathing, nonlabored ventilation, respiratory function stable and patient connected to nasal cannula oxygen Cardiovascular status: blood pressure returned to baseline and stable Postop Assessment: no apparent nausea or vomiting Anesthetic complications: no  No notable events documented.   Last Vitals:  Vitals:   11/07/22 1103 11/07/22 1113  BP:  108/66  Pulse:    Resp:    Temp: (!) 35.6 C   SpO2:      Last Pain:  Vitals:   11/07/22 1123  TempSrc:   PainSc: 0-No pain                 Stephanie Coup

## 2022-11-07 NOTE — Anesthesia Preprocedure Evaluation (Signed)
Anesthesia Evaluation  Patient identified by MRN, date of birth, ID band Patient awake    Reviewed: Allergy & Precautions, NPO status , Patient's Chart, lab work & pertinent test results  History of Anesthesia Complications Negative for: history of anesthetic complications  Airway Mallampati: II  TM Distance: >3 FB Neck ROM: Full    Dental no notable dental hx.    Pulmonary sleep apnea (has not needed CPAP since weight loss after bariatric surgery) , neg COPD   breath sounds clear to auscultation- rhonchi (-) wheezing      Cardiovascular hypertension, Pt. on medications (-) CAD and (-) Past MI  Rhythm:Regular Rate:Normal - Systolic murmurs and - Diastolic murmurs    Neuro/Psych  PSYCHIATRIC DISORDERS Anxiety      Neuromuscular disease    GI/Hepatic Neg liver ROS,GERD  ,,  Endo/Other  negative endocrine ROSneg diabetes    Renal/GU      Musculoskeletal   Abdominal   Peds  Hematology negative hematology ROS (+)   Anesthesia Other Findings PMH of parkinson's disease.   Past Medical History: No date: Anxiety No date: Arthritis     Comment: osteoarthritis -right hip No date: GERD (gastroesophageal reflux disease) No date: Hernia, incisional     Comment: after renal surgery No date: History of chicken pox No date: Hypertension No date: Renal cell carcinoma (HCC)     Comment: s/p right nephrectomy 1994 No date: Sleep apnea   Reproductive/Obstetrics                             Anesthesia Physical Anesthesia Plan  ASA: 2  Anesthesia Plan: General   Post-op Pain Management: Minimal or no pain anticipated   Induction: Intravenous  PONV Risk Score and Plan: 3 and Propofol infusion, TIVA and Ondansetron  Airway Management Planned: Nasal Cannula  Additional Equipment: None  Intra-op Plan:   Post-operative Plan:   Informed Consent: I have reviewed the patients History and  Physical, chart, labs and discussed the procedure including the risks, benefits and alternatives for the proposed anesthesia with the patient or authorized representative who has indicated his/her understanding and acceptance.     Dental advisory given  Plan Discussed with: CRNA and Surgeon  Anesthesia Plan Comments: (Discussed risks of anesthesia with patient, including possibility of difficulty with spontaneous ventilation under anesthesia necessitating airway intervention, PONV, and rare risks such as cardiac or respiratory or neurological events, and allergic reactions. Discussed the role of CRNA in patient's perioperative care. Patient understands.)        Anesthesia Quick Evaluation

## 2022-11-07 NOTE — H&P (Signed)
Wyline Mood, MD [address], [place], [place], [address] [address], [place], [place], [address] Phone: [phone]  Fax: [phone]  Primary Care Physician:  Malva Limes, MD   Pre-Procedure History & Physical: HPI:  Brittany Benton is a 67 y.o. 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   GERD (gastroesophageal reflux disease)    Hernia, incisional    after renal surgery   History of chicken pox    Hyperlipidemia    Renal cell carcinoma (HCC)    s/p right nephrectomy 1994   Sleep apnea     Past Surgical History:  Procedure Laterality Date   APPENDECTOMY  1994   BREAST BIOPSY Right 1991   Negative   CHOLECYSTECTOMY  1994   LAPAROSCOPIC GASTRIC SLEEVE RESECTION  02/02/2016   Dr Alva Garnet at [facility]   NEPHRECTOMY  1994   Renal Cell Carcinoma   parotid gland removal  1990   also removed a tumor   TONSILLECTOMY     TOTAL HIP ARTHROPLASTY Right 08/08/2016   Procedure: TOTAL HIP ARTHROPLASTY;  Surgeon: Deeann Saint, MD;  Location: [facility] ORS;  Service: Orthopedics;  Laterality: Right;    Prior to Admission medications   Medication Sig Start Date End Date Taking? Authorizing Provider  Ascorbic Acid (VITAMIN C PO) Take by mouth.   Yes [provider]  atorvastatin (LIPITOR) 20 MG tablet Take 1 tablet (20 mg total) by mouth every evening. Please schedule office visit before any future refill. 10/24/22  Yes Malva Limes, MD  Cholecalciferol (VITAMIN D-3) 1000 units CAPS Take 1 capsule by mouth daily.   Yes [provider]  imipramine (TOFRANIL) 50 MG tablet TAKE ONE TABLET BY MOUTH TWICE DAILY 01/30/22  Yes Malva Limes, MD  levothyroxine (SYNTHROID) 50 MCG tablet Take 1 tablet (50 mcg total) by mouth daily. Please schedule office visit before any future refill 10/24/22  Yes  Malva Limes, MD  metoprolol succinate (TOPROL-XL) 50 MG 24 hr tablet Take 1 tablet (50 mg total) by mouth daily. Please schedule office visit before any future refill 10/24/22  Yes Malva Limes, MD  omeprazole (PRILOSEC OTC) 20 MG tablet Take 20 mg by mouth daily. 02/24/16  Yes [provider]  omeprazole (PRILOSEC) 20 MG capsule Take 1 capsule (20 mg total) by mouth daily. Please schedule office visit before any future refill 10/24/22  Yes Malva Limes, MD  sertraline (ZOLOFT) 100 MG tablet TAKE ONE TABLET BY MOUTH DAILY 07/25/22  Yes Malva Limes, MD  Multiple Vitamins-Minerals (MULTIVITAMIN ADULT PO) Take 1 tablet by mouth daily. Patient not taking: Reported on 10/08/2022 07/16/07   [provider]  mupirocin ointment (BACTROBAN) 2 % Apply 1 Application topically 3 (three) times daily. Patient not taking: Reported on 09/24/2022    [provider]    Allergies as of 09/24/2022 - Review Complete 09/24/2022  Allergen Reaction Noted   Codeine Nausea And Vomiting and Other (See Comments) 09/22/2014   Morphine Hives, Swelling, and Other (See Comments) 09/22/2014   Tape Other (See Comments) 02/03/2016   Tramadol Other (See Comments) 06/15/2016    Family History  Problem Relation Age of Onset   Osteoporosis Mother    Dementia Mother    Heart disease Father    Heart  attack Father    Breast cancer Maternal Aunt     Social History   Socioeconomic History   Marital status: Married    Spouse name: Not on file   Number of children: 1   Years of education: 12   Highest education level: High school graduate  Occupational History   Occupation: Retired    Comment: 2016  Tobacco Use   Smoking status: Never   Smokeless tobacco: Never  Vaping Use   Vaping status: Never Used  Substance and Sexual Activity   Alcohol use: Not Currently    Alcohol/week: 0.0 standard drinks of alcohol   Drug use: No   Sexual activity: Not Currently  Other Topics Concern    Not on file  Social History Narrative   Not on file   Social Determinants of Health   Financial Resource Strain: Low Risk  (05/15/2021)   Overall Financial Resource Strain (CARDIA)    Difficulty of Paying Living Expenses: Not hard at all  Food Insecurity: No Food Insecurity (05/15/2021)   Hunger Vital Sign    Worried About Running Out of Food in the Last Year: Never true    Ran Out of Food in the Last Year: Never true  Transportation Needs: No Transportation Needs (05/15/2021)   PRAPARE - Administrator, Civil Service (Medical): No    Lack of Transportation (Non-Medical): No  Physical Activity: Inactive (05/15/2021)   Exercise Vital Sign    Days of Exercise per Week: 0 days    Minutes of Exercise per Session: 0 min  Stress: No Stress Concern Present (05/15/2021)   [employer] of Occupational Health - Occupational Stress Questionnaire    Feeling of Stress : Only a little  Social Connections: Moderately Isolated (05/15/2021)   Social Connection and Isolation Panel [NHANES]    Frequency of Communication with Friends and Family: More than three times a week    Frequency of Social Gatherings with Friends and Family: More than three times a week    Attends Religious Services: Never    Database administrator or Organizations: No    Attends Banker Meetings: Never    Marital Status: Married  Catering manager Violence: Not At Risk (05/15/2021)   Humiliation, Afraid, Rape, and Kick questionnaire    Fear of Current or Ex-Partner: No    Emotionally Abused: No    Physically Abused: No    Sexually Abused: No    Review of Systems: See HPI, otherwise negative ROS  Physical Exam: BP 128/88   Pulse (!) 105   Temp (!) 96 F (35.6 C) (Temporal)   Resp 20   Ht 5' (1.524 m)   Wt 90.7 kg   SpO2 94%   BMI 39.06 kg/m  General:   Alert,  pleasant and cooperative in NAD Head:  Normocephalic and atraumatic. Neck:  Supple; no masses or thyromegaly. Lungs:  Clear  throughout to auscultation, normal respiratory effort.    Heart:  +S1, +S2, Regular rate and rhythm, No edema. Abdomen:  Soft, nontender and nondistended. Normal bowel sounds, without guarding, and without rebound.   Neurologic:  Alert and  oriented x4;  grossly normal neurologically.  Impression/Plan: Brittany Benton is here for an endoscopy and colonoscopy  to be performed for  evaluation of iron deficiency anemia.     Risks, benefits, limitations, and alternatives regarding endoscopy have been reviewed with the patient.  Questions have been answered.  All parties agreeable.   Wyline Mood, MD  11/07/2022, 10:10 AM

## 2022-11-07 NOTE — Transfer of Care (Signed)
Immediate Anesthesia Transfer of Care Note  Patient: Brittany Benton  Procedure(s) Performed: COLONOSCOPY WITH PROPOFOL ESOPHAGOGASTRODUODENOSCOPY (EGD) WITH PROPOFOL POLYPECTOMY INTESTINAL  Patient Location: PACU  Anesthesia Type:General  Level of Consciousness: drowsy  Airway & Oxygen Therapy: Patient Spontanous Breathing  Post-op Assessment: Report given to RN and Post -op Vital signs reviewed and stable  Post vital signs: stable  Last Vitals:  Vitals Value Taken Time  BP 105/61 11/07/22 1103  Temp 35.6 C 11/07/22 1103  Pulse 92 11/07/22 1106  Resp 19 11/07/22 1106  SpO2 94 % 11/07/22 1106  Vitals shown include unfiled device data.  Last Pain:  Vitals:   11/07/22 1103  TempSrc: Temporal  PainSc:          Complications: No notable events documented.

## 2022-11-07 NOTE — Op Note (Signed)
[facility] Gastroenterology Patient Name: Brittany Benton Procedure Date: 11/07/2022 10:24 AM MRN: [id number] Account #: [id number] Date of Birth: [birth year] Admit Type: Outpatient Age: 67 Room: [facility] ENDO ROOM 2 Gender: Female Note Status: Finalized Instrument Name: Prentice Docker [id number] Procedure:             Colonoscopy Indications:           Iron deficiency anemia Providers:             Wyline Mood MD, MD Referring MD:          Demetrios Isaacs. Sherrie Mustache, MD (Referring MD) Medicines:             Monitored Anesthesia Care Complications:         No immediate complications. Procedure:             Pre-Anesthesia Assessment:                        - Prior to the procedure, a History and Physical was                         performed, and patient medications, allergies and                         sensitivities were reviewed. The patient's tolerance                         of previous anesthesia was reviewed.                        - The risks and benefits of the procedure and the                         sedation options and risks were discussed with the                         patient. All questions were answered and informed                         consent was obtained.                        - ASA Grade Assessment: II - A patient with mild                         systemic disease.                        After obtaining informed consent, the colonoscope was                         passed under direct vision. Throughout the procedure,                         the patient's blood pressure, pulse, and oxygen                         saturations were monitored continuously. The                         Colonoscope was introduced through  the anus and                         advanced to the the terminal ileum. The colonoscopy                         was performed with ease. The patient tolerated the                         procedure well. The quality of the bowel preparation                          was good. The terminal ileum, ileocecal valve,                         appendiceal orifice, and rectum were photographed. Findings:      The terminal ileum appeared normal.      Two sessile polyps were found in the sigmoid colon. The polyps were 4 to       6 mm in size. These polyps were removed with a cold snare. Resection and       retrieval were complete.      A 5 mm polyp was found in the transverse colon. The polyp was sessile.       The polyp was removed with a cold snare. Resection and retrieval were       complete.      A 3 mm polyp was found in the ascending colon. The polyp was sessile.       The polyp was removed with a cold biopsy forceps. Resection and       retrieval were complete.      Normal mucosa was found in the entire colon. Biopsies were taken with a       cold forceps for histology.      The exam was otherwise without abnormality on direct and retroflexion       views. Impression:            - The examined portion of the ileum was normal.                        - Two 4 to 6 mm polyps in the sigmoid colon, removed                         with a cold snare. Resected and retrieved.                        - One 5 mm polyp in the transverse colon, removed with                         a cold snare. Resected and retrieved.                        - One 3 mm polyp in the ascending colon, removed with                         a cold biopsy forceps. Resected and retrieved.                        - Normal  mucosa in the entire examined colon. Biopsied.                        - The examination was otherwise normal on direct and                         retroflexion views. Recommendation:        - Discharge patient to home (with escort).                        - Resume previous diet.                        - Continue present medications.                        - Await pathology results.                        - Repeat colonoscopy for surveillance based on                          pathology results.                        - To visualize the small bowel, perform video capsule                         endoscopy in 2 weeks.                        - Return to GI office as previously scheduled. Procedure Code(s):     --- Professional ---                        [phone], Colonoscopy, flexible; with removal of                         tumor(s), polyp(s), or other lesion(s) by snare                         technique                        [id number], 59, Colonoscopy, flexible; with biopsy, single                         or multiple Diagnosis Code(s):     --- Professional ---                        D12.5, Benign neoplasm of sigmoid colon                        D12.3, Benign neoplasm of transverse colon (hepatic                         flexure or splenic flexure)                        D12.2, Benign neoplasm of ascending colon  D50.9, Iron deficiency anemia, unspecified CPT copyright 2022 American Medical Association. All rights reserved. The codes documented in this report are preliminary and upon coder review may  be revised to meet current compliance requirements. Wyline Mood, MD Wyline Mood MD, MD 11/07/2022 11:02:37 AM This report has been signed electronically. Number of Addenda: 0 Note Initiated On: 11/07/2022 10:24 AM Scope Withdrawal Time: 0 hours 11 minutes 28 seconds  Total Procedure Duration: 0 hours 14 minutes 13 seconds  Estimated Blood Loss:  Estimated blood loss: none.      [facility]

## 2022-11-08 ENCOUNTER — Encounter: Payer: Self-pay | Admitting: Gastroenterology

## 2022-11-12 ENCOUNTER — Encounter: Payer: Self-pay | Admitting: Gastroenterology

## 2022-11-14 ENCOUNTER — Ambulatory Visit (INDEPENDENT_AMBULATORY_CARE_PROVIDER_SITE_OTHER): Payer: Medicare Other

## 2022-11-14 VITALS — Ht 60.0 in | Wt 200.0 lb

## 2022-11-14 DIAGNOSIS — Z0000 Encounter for general adult medical examination without abnormal findings: Secondary | ICD-10-CM

## 2022-11-14 NOTE — Progress Notes (Signed)
Subjective:   Brittany Benton is a 67 y.o. female who presents for Medicare Annual (Subsequent) preventive examination.  Visit Complete: Virtual  I connected with  Brittany Benton on 11/14/22 by a audio enabled telemedicine application and verified that I am speaking with the correct person using two identifiers.  Patient Location: Home  Provider Location: Office/Clinic  I discussed the limitations of evaluation and management by telemedicine. The patient expressed understanding and agreed to proceed.  Vital Signs: Unable to obtain new vitals due to this being a telehealth visit.  Patient Medicare AWV questionnaire was completed by the patient on 11/14/22; I have confirmed that all information answered by patient is correct and no changes since this date.  Review of Systems    Cardiac Risk Factors include: advanced age (>82men, >34 women);dyslipidemia;hypertension;obesity (BMI >30kg/m2)    Objective:    Today's Vitals   11/14/22 1018 11/14/22 1019  Weight: 200 lb (90.7 kg)   Height: 5' (1.524 m)   PainSc:  0-No pain   Body mass index is 39.06 kg/m.     11/07/2022    9:46 AM 10/08/2022   10:44 AM 06/11/2022   11:00 AM 04/10/2022   11:25 AM 08/03/2021    1:59 AM 08/02/2021    5:37 PM 06/21/2021    9:45 AM  Advanced Directives  Does Patient Have a Medical Advance Directive? No No No No No No No  Does patient want to make changes to medical advance directive?   No - Patient declined      Would patient like information on creating a medical advance directive?   No - Patient declined No - Patient declined No - Patient declined  Yes (MAU/Ambulatory/Procedural Areas - Information given)    Current Medications (verified) Outpatient Encounter Medications as of 11/14/2022  Medication Sig   Ascorbic Acid (VITAMIN C PO) Take by mouth.   atorvastatin (LIPITOR) 20 MG tablet Take 1 tablet (20 mg total) by mouth every evening. Please schedule office visit before any future refill.    Cholecalciferol (VITAMIN D-3) 1000 units CAPS Take 1 capsule by mouth daily.   imipramine (TOFRANIL) 50 MG tablet TAKE ONE TABLET BY MOUTH TWICE DAILY   levothyroxine (SYNTHROID) 50 MCG tablet Take 1 tablet (50 mcg total) by mouth daily. Please schedule office visit before any future refill   metoprolol succinate (TOPROL-XL) 50 MG 24 hr tablet Take 1 tablet (50 mg total) by mouth daily. Please schedule office visit before any future refill   omeprazole (PRILOSEC OTC) 20 MG tablet Take 20 mg by mouth daily.   omeprazole (PRILOSEC) 20 MG capsule Take 1 capsule (20 mg total) by mouth daily. Please schedule office visit before any future refill   sertraline (ZOLOFT) 100 MG tablet TAKE ONE TABLET BY MOUTH DAILY   Multiple Vitamins-Minerals (MULTIVITAMIN ADULT PO) Take 1 tablet by mouth daily. (Patient not taking: Reported on 11/14/2022)   mupirocin ointment (BACTROBAN) 2 % Apply 1 Application topically 3 (three) times daily. (Patient not taking: Reported on 09/24/2022)   No facility-administered encounter medications on file as of 11/14/2022.    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   GERD (gastroesophageal reflux disease)    Hernia, incisional    after renal  surgery   History of chicken pox    Hyperlipidemia    Hypertension    Renal cell carcinoma (HCC)    s/p right nephrectomy 1994   Sleep apnea    Thyroid disease    Past Surgical History:  Procedure Laterality Date   APPENDECTOMY  1994   BIOPSY  11/07/2022   Procedure: BIOPSY;  Surgeon: Wyline Mood, MD;  Location[facility] ENDOSCOPY;  Service: Gastroenterology;;   BREAST BIOPSY Right 1991   Negative   CHOLECYSTECTOMY  1994   COLONOSCOPY WITH PROPOFOL N/A 11/07/2022   Procedure: COLONOSCOPY WITH PROPOFOL;   Surgeon: Wyline Mood, MD;  Location: [facility] ENDOSCOPY;  Service: Gastroenterology;  Laterality: N/A;   ESOPHAGOGASTRODUODENOSCOPY (EGD) WITH PROPOFOL N/A 11/07/2022   Procedure: ESOPHAGOGASTRODUODENOSCOPY (EGD) WITH PROPOFOL;  Surgeon: Wyline Mood, MD;  Location: [facility] ENDOSCOPY;  Service: Gastroenterology;  Laterality: N/A;   JOINT REPLACEMENT  2019   LAPAROSCOPIC GASTRIC SLEEVE RESECTION  02/02/2016   Dr Alva Garnet at [facility]   NEPHRECTOMY  1994   Renal Cell Carcinoma   parotid gland removal  1990   also removed a tumor   POLYPECTOMY  11/07/2022   Procedure: POLYPECTOMY INTESTINAL;  Surgeon: Wyline Mood, MD;  Location: [facility] ENDOSCOPY;  Service: Gastroenterology;;   TONSILLECTOMY     TOTAL HIP ARTHROPLASTY Right 08/08/2016   Procedure: TOTAL HIP ARTHROPLASTY;  Surgeon: Deeann Saint, MD;  Location: [facility] ORS;  Service: Orthopedics;  Laterality: Right;   TUBAL LIGATION  1979   Family History  Problem Relation Age of Onset   Osteoporosis Mother    Dementia Mother    Anxiety disorder Mother    Heart disease Father    Heart attack Father    Breast cancer Maternal Aunt    Obesity Maternal Uncle    Social History   Socioeconomic History   Marital status: Married    Spouse name: Not on file   Number of children: 1   Years of education: 12   Highest education level: High school graduate  Occupational History   Occupation: Retired    Comment: 2016  Tobacco Use   Smoking status: Never   Smokeless tobacco: Never  Vaping Use   Vaping status: Never Used  Substance and Sexual Activity   Alcohol use: Not Currently   Drug use: No   Sexual activity: Not Currently  Other Topics Concern   Not on file  Social History Narrative   Not on file   Social Determinants of Health   Financial Resource Strain: Low Risk  (11/14/2022)   Overall Financial Resource Strain (CARDIA)    Difficulty of Paying Living Expenses: Not hard at all  Food Insecurity: No Food Insecurity (11/14/2022)   Hunger Vital  Sign    Worried About Running Out of Food in the Last Year: Never true    Ran Out of Food in the Last Year: Never true  Transportation Needs: No Transportation Needs (11/14/2022)   PRAPARE - Administrator, Civil Service (Medical): No    Lack of Transportation (Non-Medical): No  Physical Activity: Insufficiently Active (11/14/2022)   Exercise Vital Sign    Days of Exercise per Week: 2 days    Minutes of Exercise per Session: 30 min  Stress: No Stress Concern Present (11/14/2022)   [employer] of Occupational Health - Occupational Stress Questionnaire    Feeling of Stress : Only a little  Social Connections: Unknown (11/14/2022)   Social Connection and Isolation Panel [NHANES]    Frequency of Communication with  Friends and Family: Twice a week    Frequency of Social Gatherings with Friends and Family: Patient declined    Attends Religious Services: Not on Marketing executive or Organizations: No    Attends Engineer, structural: Never    Marital Status: Married    Tobacco Counseling Counseling given: Not Answered   Clinical Intake:  Pre-visit preparation completed: Yes  Pain : No/denies pain Pain Score: 0-No pain     BMI - recorded: 39.06 Nutritional Status: BMI > 30  Obese Nutritional Risks: None Diabetes: No  How often do you need to have someone help you when you read instructions, pamphlets, or other written materials from your doctor or pharmacy?: 1 - Never  Interpreter Needed?: No  Comments: lives with husband Information entered by :: B.Howard Bunte,LPN   Activities of Daily Living    11/14/2022   12:42 AM 03/30/2022    8:22 AM  In your present state of health, do you have any difficulty performing the following activities:  Hearing? 0 1  Vision? 0 0  Difficulty concentrating or making decisions? 1 1  Walking or climbing stairs? 1 1  Dressing or bathing? 0 0  Doing errands, shopping? 1 0  Preparing Food and eating ? N    Using the Toilet? N   In the past six months, have you accidently leaked urine? Y   Do you have problems with loss of bowel control? Y   Managing your Medications? N   Managing your Finances? N   Housekeeping or managing your Housekeeping? N     Patient Care Team: Malva Limes, MD as PCP - General (Family Medicine) Mariah Milling Tollie Pizza, MD as Consulting Physician (Cardiology) Deeann Saint, MD (Specialist) [place], [facility]) Venetia Night, MD as Consulting Physician (Neurosurgery) Lonell Face, MD as Consulting Physician (Neurology)  Indicate any recent Medical Services you may have received from other than [facility] providers in the past year (date may be approximate).     Assessment:   This is a routine wellness examination for Mariacamila.  Hearing/Vision screen Hearing Screening - Comments:: Adequate hearing Vision Screening - Comments:: Adequate vision  [facility]  Dietary issues and exercise activities discussed:     Goals Addressed             This Visit's Progress    Prevent falls   Not on track    Recommend to remove any items from the home that may cause slips or trips.       Depression Screen    11/14/2022   10:23 AM 03/30/2022    8:22 AM 05/15/2021   11:12 AM 08/15/2020   10:50 AM 06/01/2020    9:04 AM 05/09/2020   11:04 AM 05/15/2019    8:39 AM  PHQ 2/9 Scores  PHQ - 2 Score 0 0 0 0 0 0 0  PHQ- 9 Score  11  1 0      Fall Risk    11/14/2022   12:42 AM 03/30/2022    8:22 AM 05/15/2021   11:15 AM 06/01/2020    9:05 AM 05/09/2020   11:07 AM  Fall Risk   Falls in the past year? 1 1 1  0 1  Number falls in past yr: 1 0 1 0 1  Injury with Fall? 0 1 0 0 0  Risk for fall due to : History of fall(s) History of fall(s) History of fall(s)    Follow up Falls  prevention discussed;Education provided Falls evaluation completed Falls prevention discussed Falls evaluation completed Falls prevention discussed    MEDICARE RISK AT  HOME:   TIMED UP AND GO:  Was the test performed?  No    Cognitive Function:        11/14/2022   10:28 AM  6CIT Screen  What Year? 0 points  What month? 0 points  What time? 0 points  Count back from 20 0 points  Months in reverse 0 points  Repeat phrase 0 points  Total Score 0 points    Immunizations Immunization History  Administered Date(s) Administered   PFIZER(Purple Top)SARS-COV-2 Vaccination 06/12/2019, 07/08/2019, 04/22/2020   Tdap 09/29/2014   Zoster, Live 09/29/2014    TDAP status: Up to date  Flu Vaccine status: Declined, Education has been provided regarding the importance of this vaccine but patient still declined. Advised may receive this vaccine at local pharmacy or Health Dept. Aware to provide a copy of the vaccination record if obtained from local pharmacy or Health Dept. Verbalized acceptance and understanding.  Pneumococcal vaccine status: Due, Education has been provided regarding the importance of this vaccine. Advised may receive this vaccine at local pharmacy or Health Dept. Aware to provide a copy of the vaccination record if obtained from local pharmacy or Health Dept. Verbalized acceptance and understanding.  Covid-19 vaccine status: Completed vaccines  Qualifies for Shingles Vaccine? Yes   Zostavax completed Yes  #1 : had reaction does want #2   Screening Tests Health Maintenance  Topic Date Due   Zoster Vaccines- Shingrix (1 of 2) 07/16/1972   Pneumonia Vaccine 38+ Years old (1 of 1 - PCV) Never done   COVID-19 Vaccine (4 - 2023-24 season) 12/15/2021   INFLUENZA VACCINE  11/15/2022   DEXA SCAN  06/14/2023   Medicare Annual Wellness (AWV)  11/14/2023   MAMMOGRAM  08/30/2024   DTaP/Tdap/Td (2 - Td or Tdap) 09/28/2024   Colonoscopy  11/06/2029   Hepatitis C Screening  Completed   HPV VACCINES  Aged Out    Health Maintenance  Health Maintenance Due  Topic Date Due   Zoster Vaccines- Shingrix (1 of 2) 07/16/1972   Pneumonia  Vaccine 58+ Years old (1 of 1 - PCV) Never done   COVID-19 Vaccine (4 - 2023-24 season) 12/15/2021    Colorectal cancer screening: Type of screening: Colonoscopy. Completed yes. Repeat every 5 years  Mammogram status: Completed yes. Repeat every year  Bone Density status: Completed yes. Results reflect: Bone density results: OSTEOPENIA. Repeat every 3-5 years.  Lung Cancer Screening: (Low Dose CT Chest recommended if Age 61-80 years, 20 pack-year currently smoking OR have quit w/in 15years.) does not qualify.   Lung Cancer Screening Referral: no  Additional Screening:  Hepatitis C Screening: does not qualify; Completed yes  Vision Screening: Recommended annual ophthalmology exams for early detection of glaucoma and other disorders of the eye. Is the patient up to date with their annual eye exam?  Yes  Who is the provider or what is the name of the office in which the patient attends annual eye exams?  Eye If pt is not established with a provider, would they like to be referred to a provider to establish care? No .   Dental Screening: Recommended annual dental exams for proper oral hygiene  Diabetic Foot Exam: n/a  Community Resource Referral / Chronic Care Management: CRR required this visit?  No   CCM required this visit?  No     Plan:  I have personally reviewed and noted the following in the patient's chart:   Medical and social history Use of alcohol, tobacco or illicit drugs  Current medications and supplements including opioid prescriptions. Patient is not currently taking opioid prescriptions. Functional ability and status Nutritional status Physical activity Advanced directives List of other physicians Hospitalizations, surgeries, and ER visits in previous 12 months Vitals Screenings to include cognitive, depression, and falls Referrals and appointments  In addition, I have reviewed and discussed with patient certain preventive protocols, quality  metrics, and best practice recommendations. A written personalized care plan for preventive services as well as general preventive health recommendations were provided to patient.     Sue Lush, LPN   06/21/6576   After Visit Summary: (MyChart) Due to this being a telephonic visit, the after visit summary with patients personalized plan was offered to patient via MyChart   Nurse Notes: The patient states she is doing well and has no concerns or questions at this time.

## 2022-11-14 NOTE — Patient Instructions (Signed)
Brittany Benton , Thank you for taking time to come for your Medicare Wellness Visit. I appreciate your ongoing commitment to your health goals. Please review the following plan we discussed and let me know if I can assist you in the future.   Referrals/Orders/Follow-Ups/Clinician Recommendations: none  This is a list of the screening recommended for you and due dates:  Health Maintenance  Topic Date Due   Zoster (Shingles) Vaccine (1 of 2) 07/16/1972   Pneumonia Vaccine (1 of 1 - PCV) Never done   COVID-19 Vaccine (4 - 2023-24 season) 12/15/2021   Flu Shot  11/15/2022   DEXA scan (bone density measurement)  06/14/2023   Medicare Annual Wellness Visit  11/14/2023   Mammogram  08/30/2024   DTaP/Tdap/Td vaccine (2 - Td or Tdap) 09/28/2024   Colon Cancer Screening  11/06/2029   Hepatitis C Screening  Completed   HPV Vaccine  Aged Out    Advanced directives: (Declined) Advance directive discussed with you today. Even though you declined this today, please call our office should you change your mind, and we can give you the proper paperwork for you to fill out.  Next Medicare Annual Wellness Visit scheduled for next year: Yes8/6/25 @ 10:15am telephone  Preventive Care 65 Years and Older, Female Preventive care refers to lifestyle choices and visits with your health care provider that can promote health and wellness. What does preventive care include? A yearly physical exam. This is also called an annual well check. Dental exams once or twice a year. Routine eye exams. Ask your health care provider how often you should have your eyes checked. Personal lifestyle choices, including: Daily care of your teeth and gums. Regular physical activity. Eating a healthy diet. Avoiding tobacco and drug use. Limiting alcohol use. Practicing safe sex. Taking low-dose aspirin every day. Taking vitamin and mineral supplements as recommended by your health care provider. What happens during an annual well  check? The services and screenings done by your health care provider during your annual well check will depend on your age, overall health, lifestyle risk factors, and family history of disease. Counseling  Your health care provider may ask you questions about your: Alcohol use. Tobacco use. Drug use. Emotional well-being. Home and relationship well-being. Sexual activity. Eating habits. History of falls. Memory and ability to understand (cognition). Work and work Astronomer. Reproductive health. Screening  You may have the following tests or measurements: Height, weight, and BMI. Blood pressure. Lipid and cholesterol levels. These may be checked every 5 years, or more frequently if you are over 48 years old. Skin check. Lung cancer screening. You may have this screening every year starting at age 73 if you have a 30-pack-year history of smoking and currently smoke or have quit within the past 15 years. Fecal occult blood test (FOBT) of the stool. You may have this test every year starting at age 28. Flexible sigmoidoscopy or colonoscopy. You may have a sigmoidoscopy every 5 years or a colonoscopy every 10 years starting at age 60. Hepatitis C blood test. Hepatitis B blood test. Sexually transmitted disease (STD) testing. Diabetes screening. This is done by checking your blood sugar (glucose) after you have not eaten for a while (fasting). You may have this done every 1-3 years. Bone density scan. This is done to screen for osteoporosis. You may have this done starting at age 63. Mammogram. This may be done every 1-2 years. Talk to your health care provider about how often you should have regular mammograms.  Talk with your health care provider about your test results, treatment options, and if necessary, the need for more tests. Vaccines  Your health care provider may recommend certain vaccines, such as: Influenza vaccine. This is recommended every year. Tetanus, diphtheria, and  acellular pertussis (Tdap, Td) vaccine. You may need a Td booster every 10 years. Zoster vaccine. You may need this after age 26. Pneumococcal 13-valent conjugate (PCV13) vaccine. One dose is recommended after age 60. Pneumococcal polysaccharide (PPSV23) vaccine. One dose is recommended after age 75. Talk to your health care provider about which screenings and vaccines you need and how often you need them. This information is not intended to replace advice given to you by your health care provider. Make sure you discuss any questions you have with your health care provider. Document Released: 04/29/2015 Document Revised: 12/21/2015 Document Reviewed: 02/01/2015 Elsevier Interactive Patient Education  2017 [employer].  Fall Prevention in the Home Falls can cause injuries. They can happen to people of all ages. There are many things you can do to make your home safe and to help prevent falls. What can I do on the outside of my home? Regularly fix the edges of walkways and driveways and fix any cracks. Remove anything that might make you trip as you walk through a door, such as a raised step or threshold. Trim any bushes or trees on the path to your home. Use bright outdoor lighting. Clear any walking paths of anything that might make someone trip, such as rocks or tools. Regularly check to see if handrails are loose or broken. Make sure that both sides of any steps have handrails. Any raised decks and porches should have guardrails on the edges. Have any leaves, snow, or ice cleared regularly. Use sand or salt on walking paths during winter. Clean up any spills in your garage right away. This includes oil or grease spills. What can I do in the bathroom? Use night lights. Install grab bars by the toilet and in the tub and shower. Do not use towel bars as grab bars. Use non-skid mats or decals in the tub or shower. If you need to sit down in the shower, use a plastic, non-slip stool. Keep  the floor dry. Clean up any water that spills on the floor as soon as it happens. Remove soap buildup in the tub or shower regularly. Attach bath mats securely with double-sided non-slip rug tape. Do not have throw rugs and other things on the floor that can make you trip. What can I do in the bedroom? Use night lights. Make sure that you have a light by your bed that is easy to reach. Do not use any sheets or blankets that are too big for your bed. They should not hang down onto the floor. Have a firm chair that has side arms. You can use this for support while you get dressed. Do not have throw rugs and other things on the floor that can make you trip. What can I do in the kitchen? Clean up any spills right away. Avoid walking on wet floors. Keep items that you use a lot in easy-to-reach places. If you need to reach something above you, use a strong step stool that has a grab bar. Keep electrical cords out of the way. Do not use floor polish or wax that makes floors slippery. If you must use wax, use non-skid floor wax. Do not have throw rugs and other things on the floor that can make  you trip. What can I do with my stairs? Do not leave any items on the stairs. Make sure that there are handrails on both sides of the stairs and use them. Fix handrails that are broken or loose. Make sure that handrails are as long as the stairways. Check any carpeting to make sure that it is firmly attached to the stairs. Fix any carpet that is loose or worn. Avoid having throw rugs at the top or bottom of the stairs. If you do have throw rugs, attach them to the floor with carpet tape. Make sure that you have a light switch at the top of the stairs and the bottom of the stairs. If you do not have them, ask someone to add them for you. What else can I do to help prevent falls? Wear shoes that: Do not have high heels. Have rubber bottoms. Are comfortable and fit you well. Are closed at the toe. Do not  wear sandals. If you use a stepladder: Make sure that it is fully opened. Do not climb a closed stepladder. Make sure that both sides of the stepladder are locked into place. Ask someone to hold it for you, if possible. Clearly mark and make sure that you can see: Any grab bars or handrails. First and last steps. Where the edge of each step is. Use tools that help you move around (mobility aids) if they are needed. These include: Canes. Walkers. Scooters. Crutches. Turn on the lights when you go into a dark area. Replace any light bulbs as soon as they burn out. Set up your furniture so you have a clear path. Avoid moving your furniture around. If any of your floors are uneven, fix them. If there are any pets around you, be aware of where they are. Review your medicines with your doctor. Some medicines can make you feel dizzy. This can increase your chance of falling. Ask your doctor what other things that you can do to help prevent falls. This information is not intended to replace advice given to you by your health care provider. Make sure you discuss any questions you have with your health care provider. Document Released: 01/27/2009 Document Revised: 09/08/2015 Document Reviewed: 05/07/2014 Elsevier Interactive Patient Education  2017 [employer].

## 2022-12-11 ENCOUNTER — Ambulatory Visit: Payer: Medicare Other

## 2022-12-12 ENCOUNTER — Inpatient Hospital Stay: Payer: Medicare Other | Attending: Internal Medicine

## 2022-12-12 DIAGNOSIS — E538 Deficiency of other specified B group vitamins: Secondary | ICD-10-CM | POA: Diagnosis not present

## 2022-12-12 MED ORDER — CYANOCOBALAMIN 1000 MCG/ML IJ SOLN
1000.0000 ug | Freq: Once | INTRAMUSCULAR | Status: AC
  Administered 2022-12-12: 1000 ug via INTRAMUSCULAR
  Filled 2022-12-12: qty 1

## 2022-12-20 ENCOUNTER — Telehealth: Payer: Self-pay

## 2022-12-20 ENCOUNTER — Other Ambulatory Visit: Payer: Self-pay

## 2022-12-20 DIAGNOSIS — D508 Other iron deficiency anemias: Secondary | ICD-10-CM

## 2022-12-20 NOTE — Telephone Encounter (Signed)
Okay, great.  °

## 2022-12-20 NOTE — Telephone Encounter (Signed)
Called patient but was not able to leave her a voicemail. Patient needs a capsule study done. Hopefully before her follow up visit with Mrs. Tina Garrett-PA-C on 01/02/2023. Please call patient again when you have a chance as this needs to be scheduled. Thank you. 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

## 2022-12-20 NOTE — Telephone Encounter (Signed)
Scheduled [facility] capsule study 12-24-22 w/Dr.Anna -patient coming by office 12-21-22 for instructions. Patient reminded she has appointment w/Tina on 01-02-23.

## 2022-12-20 NOTE — Telephone Encounter (Signed)
I left message for patient to call office so we can get Capsule study scheduled.  Called patient but was not able to leave her a voicemail. Patient needs a capsule study done. Hopefully before her follow up visit with Mrs. Tina Garrett-PA-C on 01/02/2023. Please call patient again when you have a chance as this needs to be scheduled. Thank you. 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

## 2022-12-24 ENCOUNTER — Encounter: Payer: Self-pay | Admitting: Certified Registered"

## 2022-12-24 ENCOUNTER — Ambulatory Visit
Admission: RE | Admit: 2022-12-24 | Discharge: 2022-12-24 | Disposition: A | Discharge status: Home / Self Care | Payer: Medicare Other | Attending: Gastroenterology | Admitting: Gastroenterology

## 2022-12-24 ENCOUNTER — Encounter
Admission: RE | Disposition: A | Discharge status: Home / Self Care | Payer: Self-pay | Source: Home / Self Care | Attending: Gastroenterology

## 2022-12-24 DIAGNOSIS — D509 Iron deficiency anemia, unspecified: Secondary | ICD-10-CM | POA: Diagnosis not present

## 2022-12-24 SURGERY — GIVENS CAPSULE STUDY

## 2022-12-28 ENCOUNTER — Other Ambulatory Visit: Payer: Self-pay

## 2023-01-02 ENCOUNTER — Ambulatory Visit: Payer: Medicare Other | Admitting: Physician Assistant

## 2023-01-02 ENCOUNTER — Encounter: Payer: Self-pay | Admitting: Physician Assistant

## 2023-01-02 VITALS — BP 130/78 | HR 88 | Temp 97.7°F | Ht 60.0 in | Wt 207.4 lb

## 2023-01-02 DIAGNOSIS — R195 Other fecal abnormalities: Secondary | ICD-10-CM

## 2023-01-02 DIAGNOSIS — R197 Diarrhea, unspecified: Secondary | ICD-10-CM

## 2023-01-02 DIAGNOSIS — K2930 Chronic superficial gastritis without bleeding: Secondary | ICD-10-CM

## 2023-01-02 DIAGNOSIS — K2950 Unspecified chronic gastritis without bleeding: Secondary | ICD-10-CM | POA: Diagnosis not present

## 2023-01-02 DIAGNOSIS — Z9884 Bariatric surgery status: Secondary | ICD-10-CM

## 2023-01-02 DIAGNOSIS — D509 Iron deficiency anemia, unspecified: Secondary | ICD-10-CM

## 2023-01-02 DIAGNOSIS — E538 Deficiency of other specified B group vitamins: Secondary | ICD-10-CM | POA: Diagnosis not present

## 2023-01-02 DIAGNOSIS — Z86010 Personal history of colonic polyps: Secondary | ICD-10-CM

## 2023-01-02 NOTE — Progress Notes (Signed)
Celso Amy, PA-C [address]  [address]  [place], [place] [address]  Main: [phone]  Fax: [phone]   Primary Care Physician: Malva Limes, MD  Primary Gastroenterologist:  Celso Amy, PA-C / Dr. Wyline Mood    CC: Follow-up iron deficiency anemia  HPI: Brittany Benton is a 67 y.o. female returns for 59-month follow-up of iron deficiency anemia.  She last saw Dr. Tobi Bastos 09/2022.  Referred by Dr. Alena Bills February 2024.  History of gastric bypass.  Renal cell carcinoma s/p nephrectomy 1994.  Cholecystectomy at age 83.  Receives IV iron infusions.  History of iron and B12 deficiency monitored by hematology.  Denies NSAID use.  Labs 09/2022: Negative celiac, negative H. pylori breath test, normal iron, low vitamin B12 149, improved normal hemoglobin 14.7.  Colonoscopy 10/2022 by Dr. Tobi Bastos showed 4 small adenomatous polyps removed and good prep.  Biopsies showed 1 tubular adenoma and several hyperplastic polyps.  5-year repeat.  EGD 10/2022 showed severe gastritis, normal esophagus and duodenum.  Biopsy showed chronic gastritis and negative for H. pylori.  Capsule endoscopy results are pending.  Current Symptoms: Patient continues to have loose bowel movements since 09/2021.  She has 2 or 3 loose stools per day.  Mostly after she eats.  She states she only eats 1 meal per day.  Has to have a bowel movement as soon as she eats.  She denies watery diarrhea, constipation, or abdominal pain.  Has history of Parkinson's disease.  Her main frustration is loose bowel movements.   Current Outpatient Medications  Medication Sig Dispense Refill   Ascorbic Acid (VITAMIN C PO) Take by mouth.     atorvastatin (LIPITOR) 20 MG tablet Take 1 tablet (20 mg total) by mouth every evening. Please schedule office visit before any future refill. 90 tablet 0   Cholecalciferol (VITAMIN D-3) 1000 units CAPS Take 1 capsule by mouth daily.     imipramine (TOFRANIL) 50 MG tablet TAKE ONE TABLET BY MOUTH  TWICE DAILY 180 tablet 3   levothyroxine (SYNTHROID) 50 MCG tablet Take 1 tablet (50 mcg total) by mouth daily. Please schedule office visit before any future refill 90 tablet 0   metoprolol succinate (TOPROL-XL) 50 MG 24 hr tablet Take 1 tablet (50 mg total) by mouth daily. Please schedule office visit before any future refill 90 tablet 0   Multiple Vitamins-Minerals (MULTIVITAMIN ADULT PO) Take 1 tablet by mouth daily.     omeprazole (PRILOSEC OTC) 20 MG tablet Take 20 mg by mouth daily.     omeprazole (PRILOSEC) 20 MG capsule Take 1 capsule (20 mg total) by mouth daily. Please schedule office visit before any future refill 90 capsule 0   sertraline (ZOLOFT) 100 MG tablet TAKE ONE TABLET BY MOUTH DAILY 90 tablet 3   No current facility-administered medications for this visit.    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   GERD (gastroesophageal reflux disease)    Hernia, incisional    after renal surgery  History of chicken pox    Hyperlipidemia    Hypertension    Renal cell carcinoma (HCC)    s/p right nephrectomy 1994   Sleep apnea    Thyroid disease     Past Surgical History:  Procedure Laterality Date   APPENDECTOMY  1994   BIOPSY  11/07/2022   Procedure: BIOPSY;  Surgeon: Wyline Mood, MD;  Location: [facility] ENDOSCOPY;  Service: Gastroenterology;;   BREAST BIOPSY Right 1991   Negative   CHOLECYSTECTOMY  1994   COLONOSCOPY WITH PROPOFOL N/A 11/07/2022   Procedure: COLONOSCOPY WITH PROPOFOL;  Surgeon: Wyline Mood, MD;  Location[facility]  [facility] ENDOSCOPY;  Service: Gastroenterology;  Laterality: N/A;   ESOPHAGOGASTRODUODENOSCOPY (EGD) WITH PROPOFOL N/A 11/07/2022   Procedure: ESOPHAGOGASTRODUODENOSCOPY (EGD) WITH PROPOFOL;  Surgeon: Wyline Mood, MD;  Location: [facility] ENDOSCOPY;  Service: Gastroenterology;  Laterality: N/A;   JOINT REPLACEMENT  2019   LAPAROSCOPIC GASTRIC SLEEVE RESECTION  02/02/2016   Dr Alva Garnet at [facility]   NEPHRECTOMY  1994   Renal Cell Carcinoma   parotid gland removal  1990   also removed a tumor   POLYPECTOMY  11/07/2022   Procedure: POLYPECTOMY INTESTINAL;  Surgeon: Wyline Mood, MD;  Location: [facility] ENDOSCOPY;  Service: Gastroenterology;;   TONSILLECTOMY     TOTAL HIP ARTHROPLASTY Right 08/08/2016   Procedure: TOTAL HIP ARTHROPLASTY;  Surgeon: Deeann Saint, MD;  Location: [facility] ORS;  Service: Orthopedics;  Laterality: Right;   TUBAL LIGATION  1979    Review of Systems:    All systems reviewed and negative except where noted in HPI.   Physical Examination:   BP 130/78   Pulse 88   Temp 97.7 F (36.5 C)   Ht 5' (1.524 m)   Wt 207 lb 6.4 oz (94.1 kg)   BMI 40.51 kg/m   General: Well-nourished, well-developed in no acute distress.  Neuro: Alert and oriented x 3.  Grossly intact.  Psych: Alert and cooperative, normal mood and affect. No Exam performed.  Imaging Studies: No results found.  Assessment and Plan:   Brittany Benton is a 67 y.o. y/o female returns for 4-month follow-up of iron deficiency and B12 deficiency anemia.  EGD 10/2022 showed severe gastritis, otherwise normal.  Colonoscopy showed 4 small adenomatous and hyperplastic polyps removed.  Celiac labs negative.  H. pylori test negative.  Has history of gastric bypass surgery, cholecystectomy, and nephrectomy.  Her main concern today is persistent chronic loose stools.  I explained to patient source of her iron deficiency and B12 deficiency is most likely gastric bypass surgery.  Still waiting on results of recent capsule  endoscopy.  1.  Iron deficiency anemia; Currently Resolved  Labs: CBC, Iron panal, Ferritin, B12  She has received IV iron through hematology in the past, not recently.  We will follow-up with capsule endoscopy results.  2.  B12 deficiency  Continue vitamin B12  3.  Chronic gastritis  Continue omeprazole 20 Mg daily  Avoid NSAIDS.  4.  Chronic Loose Stools  Order Fecal Pancreatic Elastase stool test.  Check for pancreatic insufficiency.  If pancreatic elastase is low, then we will start her on pancreas enzymes.  If test is normal, then recommend cholestyramine powder 1 packet in a drink once daily.  5.  History of gastric bypass  6.  History of adenomatous colon polyp  5-year repeat colonoscopy will be due 10/2027   Celso Amy, PA-C  Follow up As needed based on Lab results and GI symptoms.

## 2023-01-03 ENCOUNTER — Encounter: Payer: Self-pay | Admitting: Physician Assistant

## 2023-01-05 LAB — CBC
Hematocrit: 44.7 % (ref 34.0–46.6)
Hemoglobin: 14.3 g/dL (ref 11.1–15.9)
MCH: 27.2 pg (ref 26.6–33.0)
MCHC: 32 g/dL (ref 31.5–35.7)
MCV: 85 fL (ref 79–97)
Platelets: 235 10*3/uL (ref 150–450)
RBC: 5.25 x10E6/uL (ref 3.77–5.28)
RDW: 13.3 % (ref 11.7–15.4)
WBC: 6 10*3/uL (ref 3.4–10.8)

## 2023-01-05 LAB — IRON,TIBC AND FERRITIN PANEL
Ferritin: 39 ng/mL (ref 15–150)
Iron Saturation: 20 % (ref 15–55)
Iron: 72 ug/dL (ref 27–139)
Total Iron Binding Capacity: 363 ug/dL (ref 250–450)
UIBC: 291 ug/dL (ref 118–369)

## 2023-01-05 LAB — VITAMIN B12: Vitamin B-12: 407 pg/mL (ref 232–1245)

## 2023-01-21 ENCOUNTER — Other Ambulatory Visit: Payer: Self-pay | Admitting: Family Medicine

## 2023-01-21 DIAGNOSIS — I10 Essential (primary) hypertension: Secondary | ICD-10-CM

## 2023-01-21 DIAGNOSIS — E039 Hypothyroidism, unspecified: Secondary | ICD-10-CM

## 2023-01-21 DIAGNOSIS — E785 Hyperlipidemia, unspecified: Secondary | ICD-10-CM

## 2023-01-27 ENCOUNTER — Other Ambulatory Visit: Payer: Self-pay | Admitting: Family Medicine

## 2023-01-28 NOTE — Telephone Encounter (Signed)
Requested Prescriptions  Pending Prescriptions Disp Refills   omeprazole (PRILOSEC) 20 MG capsule [Pharmacy Med Name: OMEPRAZOLE DR 20 MG CAP] 90 capsule 0    Sig: TAKE 1 CAPSULE BY MOUTH ONCE DAILY     Gastroenterology: Proton Pump Inhibitors Passed - 01/27/2023  5:37 PM      Passed - Valid encounter within last 12 months    Recent Outpatient Visits           10 months ago Frequent urination   [place] [name] Malva Limes, MD   1 year ago Moderate protein-calorie malnutrition [facility])   [facility] Malva Limes, MD   1 year ago Lethargy   [place] [facility] Malva Limes, MD   1 year ago Adverse effect of drug, initial encounter   [facility] Malva Limes, MD   1 year ago Meningioma [facility])   [place] [facility] Malva Limes, MD               Patient will need to schedule an office visit prior to next refill.

## 2023-02-08 ENCOUNTER — Inpatient Hospital Stay: Payer: Medicare Other | Attending: Internal Medicine

## 2023-02-08 DIAGNOSIS — E538 Deficiency of other specified B group vitamins: Secondary | ICD-10-CM | POA: Insufficient documentation

## 2023-02-08 MED ORDER — CYANOCOBALAMIN 1000 MCG/ML IJ SOLN
1000.0000 ug | Freq: Once | INTRAMUSCULAR | Status: AC
  Administered 2023-02-08: 1000 ug via INTRAMUSCULAR
  Filled 2023-02-08: qty 1

## 2023-02-25 DIAGNOSIS — G20C Parkinsonism, unspecified: Secondary | ICD-10-CM | POA: Diagnosis not present

## 2023-02-25 DIAGNOSIS — R29898 Other symptoms and signs involving the musculoskeletal system: Secondary | ICD-10-CM | POA: Diagnosis not present

## 2023-03-11 ENCOUNTER — Ambulatory Visit: Payer: Medicare Other | Admitting: Occupational Therapy

## 2023-03-11 ENCOUNTER — Ambulatory Visit: Payer: Medicare Other | Attending: Family Medicine | Admitting: Speech Pathology

## 2023-03-11 DIAGNOSIS — R471 Dysarthria and anarthria: Secondary | ICD-10-CM | POA: Diagnosis not present

## 2023-03-11 DIAGNOSIS — M6281 Muscle weakness (generalized): Secondary | ICD-10-CM | POA: Insufficient documentation

## 2023-03-11 DIAGNOSIS — R278 Other lack of coordination: Secondary | ICD-10-CM

## 2023-03-11 NOTE — Therapy (Signed)
OUTPATIENT SPEECH LANGUAGE PATHOLOGY PARKINSON'S EVALUATION   Patient Name: Brittany Benton MRN: [id number] DOB:[birth year], 67 y.o., female Today's Date: 03/11/2023  PCP: Mila Merry, MD REFERRING PROVIDER: Cristopher Peru, MD   End of Session - 03/11/23 [phone]     Visit Number 1    Number of Visits 17    Date for SLP Re-Evaluation 05/06/23    Authorization Type [employer]    Progress Note Due on Visit 10    SLP Start Time 0930    SLP Stop Time  1015    SLP Time Calculation (min) 45 min    Activity Tolerance Patient tolerated treatment well             Past Medical History:  Diagnosis Date   Allergy 1989   Anemia    Anxiety    Arthritis    osteoarthritis -right hip   Cataract    Depression    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   GERD (gastroesophageal reflux disease)    Hernia, incisional    after renal surgery   History of chicken pox    Hyperlipidemia    Hypertension    Renal cell carcinoma (HCC)    s/p right nephrectomy 1994   Sleep apnea    Thyroid disease    Past Surgical History:  Procedure Laterality Date   APPENDECTOMY  1994   BIOPSY  11/07/2022   Procedure: BIOPSY;  Surgeon: Wyline Mood, MD;  Location: [facility] ENDOSCOPY;  Service: Gastroenterology;;   BREAST BIOPSY Right 1991   Negative   CHOLECYSTECTOMY  1994   COLONOSCOPY WITH PROPOFOL N/A 11/07/2022   Procedure: COLONOSCOPY WITH PROPOFOL;  Surgeon: Wyline Mood, MD;  Location: [facility] ENDOSCOPY;  Service: Gastroenterology;  Laterality: N/A;   ESOPHAGOGASTRODUODENOSCOPY (EGD) WITH PROPOFOL N/A 11/07/2022   Procedure: ESOPHAGOGASTRODUODENOSCOPY (EGD) WITH PROPOFOL;  Surgeon: Wyline Mood, MD;  Location: [facility] ENDOSCOPY;  Service: Gastroenterology;  Laterality: N/A;   JOINT REPLACEMENT  2019   LAPAROSCOPIC GASTRIC SLEEVE RESECTION  02/02/2016   Dr Alva Garnet at [facility]   NEPHRECTOMY  1994   Renal Cell Carcinoma   parotid gland removal  1990   also removed a tumor   POLYPECTOMY   11/07/2022   Procedure: POLYPECTOMY INTESTINAL;  Surgeon: Wyline Mood, MD;  Location: [facility] ENDOSCOPY;  Service: Gastroenterology;;   TONSILLECTOMY     TOTAL HIP ARTHROPLASTY Right 08/08/2016   Procedure: TOTAL HIP ARTHROPLASTY;  Surgeon: Deeann Saint, MD;  Location: [facility] ORS;  Service: Orthopedics;  Laterality: Right;   TUBAL LIGATION  1979   Patient Active Problem List   Diagnosis Date Noted   B12 deficiency 06/11/2022   Iron deficiency anemia 04/10/2022   Morbid obesity (HCC) 03/30/2022   Vitamin A deficiency 08/15/2021   Parkinson's disease (HCC) 08/03/2021   Acute urinary retention 08/03/2021   Meningioma (HCC) 04/05/2021   Hypothyroidism 08/15/2020   Osteopenia 06/14/2020   Status post total hip replacement, 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   Osteoarthritis of right hip 03/26/2016   H/O gastric bypass 03/26/2016   Sinus tachycardia 08/01/2015   Obesity (BMI 30-39.9) 06/08/2015   OSA (obstructive sleep apnea) 06/08/2015   GERD without esophagitis 06/08/2015   Incisional hernia 06/08/2015   Arthritis of hip 03/23/2015   Left knee pain 02/15/2015   Vitamin D deficiency 09/29/2014   Lump of right breast 09/22/2014   Prediabetes 09/22/2014   Pruritic dermatitis 09/22/2014   Obstructive sleep apnea 09/22/2014   Allergic rhinitis 09/15/2009   Hypersomnia 09/14/2009   Panic disorder 08/06/2008  Essential (primary) hypertension 07/16/2007   Hyperlipidemia 07/16/2007    ONSET DATE: 02/26/2023 date of referral  REFERRING DIAG: G20.C (ICD-10-CM) - Parkinsonism, unspecified  THERAPY DIAG:  Dysarthria and anarthria  Rationale for Evaluation and Treatment Rehabilitation  SUBJECTIVE:   SUBJECTIVE STATEMENT: Pt pleasant, good historian Pt accompanied by: significant other  PERTINENT HISTORY and DIAGNOSTIC FINDINGS:   Pt is a 67 year old right handed female with Concern for Parkinsonism in a patient with resting and tremors tremors (right > left) with occasional whole-body  jerks/myoclonus, bradykinesia, hallucinations while waking up, bilateral bradykinesia (left > right), minimal cogwheeling bilaterally, sensation of stiffness, speech changes, imbalance.   Cognitive impairment, likely secondary malnutrition, iron deficiency, Vitamin B12 deficiency with possible component of Parkinsonism in a patient with generalized atrophy CT Head without Contrast 04/03/2021: Chronic atrophic changes without acute abnormality.   Hisotry of obstructive sleep apnea with Mallampati score IV (home sleep study 07/11/2022 - mild obstructive sleep apnea - pt deferred CPAP), hisotry of moderate protein-calorie malnutrition with hospitalization (08/02/2021-08/06/2021) with discharge to SNF for 3 weeks. Pt states that she received ST service while at [facility] [employer] for her voice/speech.   PAIN:  Are you having pain? No  FALLS: Has patient fallen in last 6 months?  No  LIVING ENVIRONMENT: Lives with: lives with their spouse Lives in: House/apartment  PLOF:  Level of assistance: Needed assistance with ADLs, Needed assistance with IADLS Employment: On disability  PATIENT GOALS    to improve her speech  OBJECTIVE:  COGNITIVE COMMUNICATION Overall cognitive status: History of cognitive impairments - at baseline  Auditory comprehension: WFL Verbal expression: WFL Functional communication: WFL Functional deficits: some hypophonia noted   MOTOR SPEECH: Overall motor speech: impaired Respiration: diaphragmatic/abdominal breathing Phonation: breathy and low vocal intensity Resonance: WFL Articulation: Appears intact Intelligibility: Intelligible Motor planning: Appears intact Effective technique: increased vocal intensity  ORAL MOTOR EXAMINATION Facial : WFL Lingual: WFL Velum: WFL Mandible: WFL Cough: WFL Voice: Breathy   OBJECTIVE VOICE ASSESSMENT: Sustained "ah" maximum phonation time: 2.1 seconds Sustained "ah" loudness average: 68 dB Average fundamental frequency  during sustained "ah":220 Hz   (1 SD below average of  244 Hz +/- 27 for gender)  Oral reading (passage) loudness average: 76 dB Conversational pitch average: 217 Hz Conversational loudness average: 76 dB Voice quality: breathy, low vocal intensity, and vocal fatigue  Patient does not report difficulty swallowing which does not warrant further evaluation   PATIENT EDUCATION: Education details: results of this assessment, ST POC, hearing loss and word recognition Person educated: Patient and Spouse Education method: Explanation Education comprehension: needs further education   HOME EXERCISE PROGRAM: N/A   GOALS: Goals reviewed with patient? Yes  SHORT TERM GOALS: Target date: 10 sessions  With Min A, patient will demo HEP for motor speech accurately in 5/7 opportunities.  Baseline: Goal status: INITIAL  2.  To determine optimal resistance levels for Respiratory Muscle Training (RMT) for improving increase hyolaryngeal elevation and strengthen cough for airway clearance, patient will participate in evaluation (and re-assessment as needed) of maximum expiratory pressure (MEP). Baseline:  Goal status: INITIAL  3.  With Min A, patient will maintain adequate vocal quality and intensity (>70dB) in sentence level responses in 5 of 7 opportunities.  Baseline:  Goal status: INITIAL   LONG TERM GOALS: Target date: 05/06/2023  With Rare Min A, patient will identify spontaneous response below WNL with min nonverbal cues and attempt self-correction in >75% of opportunities.  Baseline:  Goal status: INITIAL  2.  With Rare Min A, patient will maintain adequate vocal quality and endurance in 20+ minute conversation outside ST room over 3 sessions.  Baseline:  Goal status: INITIAL   ASSESSMENT:  CLINICAL IMPRESSION: Patient is a 67 y.o. female who was seen today for a Parkinson's Evaluation. Pt presents with mild hypokinetic dysarthria that is c/b reduced vocal intensity,  tremorous, low pitch and some intermittent difficulty initiating motor movements.   OBJECTIVE IMPAIRMENTS include expressive language and dysarthria. These impairments are limiting patient from effectively communicating at home and in community. Factors affecting potential to achieve goals and functional outcome are medical prognosis, previous level of function, and family/community support. Patient will benefit from skilled SLP services to address above impairments and improve overall function.  REHAB POTENTIAL: Good  PLAN: SLP FREQUENCY: 1-2x/week  SLP DURATION: 8 weeks  PLANNED INTERVENTIONS: 92507 Treatment of speech (30 or 45 min) , SLP instruction and feedback, Compensatory strategies, and Patient/family education

## 2023-03-11 NOTE — Therapy (Signed)
OUTPATIENT OCCUPATIONAL THERAPY NEURO EVALUATION  Patient Name: Brittany Benton MRN: [id number] DOB:Jun 26, 1955, 67 y.o., female Today's Date: 03/11/2023  PCP: Dr. Sherrie Mustache REFERRING PROVIDER: Dr. 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   GERD (gastroesophageal reflux disease)    Hernia, incisional    after renal surgery   History of chicken pox    Hyperlipidemia    Hypertension    Renal cell carcinoma (HCC)    s/p right nephrectomy 1994   Sleep apnea    Thyroid disease    Past Surgical History:  Procedure Laterality Date   APPENDECTOMY  1994   BIOPSY  11/07/2022   Procedure: BIOPSY;  Surgeon: Wyline Mood, MD;  Location: [facility] ENDOSCOPY;  Service: Gastroenterology;;   BREAST BIOPSY Right 1991   Negative   CHOLECYSTECTOMY  1994   COLONOSCOPY WITH PROPOFOL N/A 11/07/2022   Procedure: COLONOSCOPY WITH PROPOFOL;  Surgeon: Wyline Mood, MD;  Location: [facility] ENDOSCOPY;  Service: Gastroenterology;  Laterality: N/A;   ESOPHAGOGASTRODUODENOSCOPY (EGD) WITH PROPOFOL N/A 11/07/2022   Procedure: ESOPHAGOGASTRODUODENOSCOPY (EGD) WITH PROPOFOL;  Surgeon: Wyline Mood, MD;  Location: [facility] ENDOSCOPY;  Service: Gastroenterology;  Laterality: N/A;   JOINT REPLACEMENT  2019   LAPAROSCOPIC GASTRIC SLEEVE RESECTION  02/02/2016   Dr Alva Garnet at [facility]   NEPHRECTOMY  1994   Renal Cell Carcinoma   parotid gland removal  1990   also removed a tumor   POLYPECTOMY  11/07/2022   Procedure: POLYPECTOMY  INTESTINAL;  Surgeon: Wyline Mood, MD;  Location: [facility] ENDOSCOPY;  Service: Gastroenterology;;   TONSILLECTOMY     TOTAL HIP ARTHROPLASTY Right 08/08/2016   Procedure: TOTAL HIP ARTHROPLASTY;  Surgeon: Deeann Saint, MD;  Location: [facility] ORS;  Service: Orthopedics;  Laterality: Right;   TUBAL LIGATION  1979   Patient Active Problem List   Diagnosis Date Noted   B12 deficiency 06/11/2022   Iron deficiency anemia 04/10/2022   Morbid obesity (HCC) 03/30/2022   Vitamin A deficiency 08/15/2021   Parkinson's disease (HCC) 08/03/2021   Acute urinary retention 08/03/2021   Meningioma (HCC) 04/05/2021   Hypothyroidism 08/15/2020   Osteopenia 06/14/2020   Status post total hip replacement, 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   Osteoarthritis of right hip 03/26/2016   H/O gastric bypass 03/26/2016   Sinus tachycardia 08/01/2015   Obesity (BMI 30-39.9) 06/08/2015   OSA (obstructive sleep apnea) 06/08/2015   GERD without esophagitis 06/08/2015   Incisional hernia 06/08/2015   Arthritis of hip 03/23/2015   Left knee pain 02/15/2015   Vitamin D deficiency 09/29/2014   Lump of right breast 09/22/2014   Prediabetes 09/22/2014   Pruritic dermatitis 09/22/2014   Obstructive sleep apnea 09/22/2014   Allergic rhinitis 09/15/2009   Hypersomnia 09/14/2009   Panic disorder 08/06/2008   Essential (primary) hypertension 07/16/2007   Hyperlipidemia 07/16/2007  ONSET DATE: 2022 January 2022  REFERRING DIAG: Parkinson's Disease  THERAPY DIAG:  Muscle weakness (generalized)  Other lack of coordination  Rationale for Evaluation and Treatment: Rehabilitation  SUBJECTIVE:   SUBJECTIVE STATEMENT:  Pt. and her husband were present for the initial evaluation.  Pt accompanied by: significant other  PERTINENT HISTORY:  Pt. Presents with a diagnosis of Primary Parkinsonism. PMHx includes: Meningioma, Hypothyroidism, Osteopenia, H/O gastric Bypass, GERD, Sleep Apnea, Arthritis Vitamin D deficiency, Prediabetes,  Right breast lump, HTN, Panic Disorder, Hx Chicken Pox, Anxiety, Hernia incisional, Diabetic necrobiosis lipoidica, Renal Cell Carcinoma, Tremor.   PRECAUTIONS: Fall  WEIGHT BEARING RESTRICTIONS: No  PAIN:  Are you having pain? No  FALLS: Has patient fallen in last 6 months? Yes. Number of falls 5-6  LIVING ENVIRONMENT: Lives with: lives with their family and lives with their spouse Lives in:  Split level home Stairs: Multiple level of steps.4 steps 7 steps from yard  into the house Has following equipment at home: Walker - 4 wheeled and Tour manager  PLOF: Independent  PATIENT GOALS:  To be able to walk without the walker  OBJECTIVE:  Note: Objective measures were completed at Evaluation unless otherwise noted.  HAND DOMINANCE: Right  ADLs:  Transfers/ambulation related to ADLs: Eating: Independent Grooming: Indpendent  UB Dressing: Independent LB Dressing: Indpendent Toileting: Independent Bathing: Independent Tub Shower transfers: Modified Equipment: Transfer tub bench  IADLs: Shopping: Husband performs, limited stamina 30-45 min. Pt. Reports that she was able to walk around [employer] for about a hour recently. Light housekeeping: Modified independent Meal Prep: Independent with light meal prep, microwave use. Does not use the stove. Community mobility: Relies on husband, and family.  Medication management:  Independent Financial management:  Still performs Handwriting: 75% legible  MOBILITY STATUS: Hx of falls  POSTURE COMMENTS:   Sitting balance: good  ACTIVITY TOLERANCE: Activity tolerance: Limited in standing with ambulation  FUNCTIONAL OUTCOME MEASURES: FOTO: 38  UPPER EXTREMITY ROM:    Active ROM Right Eval WFL Left Eval [facility]  Shoulder flexion    Shoulder abduction    Shoulder adduction    Shoulder extension    Shoulder internal rotation    Shoulder external rotation    Elbow flexion    Elbow extension    Wrist flexion    Wrist extension     Wrist ulnar deviation    Wrist radial deviation    Wrist pronation    Wrist supination    (Blank rows = not tested)  UPPER EXTREMITY MMT:     MMT Right Eval  Left Eval   Shoulder flexion 4/5 4/5  Shoulder abduction 4/5 4/5  Shoulder adduction    Shoulder extension    Shoulder internal rotation    Shoulder external rotation    Middle trapezius    Lower trapezius    Elbow flexion 4+/5 4+/5  Elbow extension 4+/5 4+/5  Wrist flexion    Wrist extension 4+/5 4+/5  Wrist ulnar deviation    Wrist radial deviation    Wrist pronation    Wrist supination    (Blank rows = not tested)  HAND FUNCTION: Grip strength: Right: 28 lbs; Left: 18 lbs, Lateral pinch: Right: 15 lbs, Left: 11 lbs, and 3 point pinch: Right: 0 lbs, Left: 3 lbs  COORDINATION: 9 Hole Peg test: Right: 34 sec; Left: 40 sec  SENSATION: WFL Light touch: WFL Proprioception: WFL  EDEMA: N/A  MUSCLE TONE:  intact  COGNITION: Overall cognitive status: Within functional limits for tasks assessed,  increased time required   VISION: Subjective report:  Wears glasses. Reports no changes. Baseline vision: Wears glasses all the time   PERCEPTION: Not tested  PRAXIS: WFL for tasks performed   TODAY'S TREATMENT:                                                                                                                              DATE: 03/10/2024   PATIENT EDUCATION: Education details: OT services/Assessment  Person educated: Patient and Spouse Education method: Medical illustrator Education comprehension: verbalized understanding  HOME EXERCISE PROGRAM: N/A   GOALS:  No OT treatment plan of care or goals are warranted at this time.  ASSESSMENT:  CLINICAL IMPRESSION:  Patient is a 67 y.o. female who was seen today for occupational therapy evaluation for Parkinson's  Disease. Pt. was seen for the initial OT evaluation. Pt. Presents with mild limitations with BUE strength, Left  grip strength, lateral pinch strength, bilateral 3pt. Pinch strength, and FMC speed. Pt. reports that these limitations do not affect her ability to engage in daily ADLs, and IADL tasks. Pt. endorses balance, and gait limitations which affect her ability  to navigate uneven torrain, stairs, ambulation, and functional mobility. OT services are not warranted at this time. Pt. is most appropriate for a referral for PT services to address these gait-related concerns. Pt. had questions about the LSVT BIG. Pt. education was provided about formal LSVT BIG treatment no longer being offered at this clinic. Pt. education was provided about rehabilitation therapy services available at this clinic to treat Parkinson's Disease.    PERFORMANCE DEFICITS: in functional skills including strength, Fine motor control, and balance, cognitive skills including memory and problem solving, and psychosocial skills including environmental adaptation.   IMPAIRMENTS:  mild impairments in BUE strength, and FMC. Pt. Reports they are not interfering with her ability to complete, perform, or engage in ADLs/IADLs. Pt. Reports Impairments with balance and gait are affecting her ability to navigate uneven terrain, climb stairs, functional mobility.   CO-MORBIDITIES: may have co-morbidities  that affects occupational performance. Patient will benefit from skilled OT to address above impairments and improve overall function.  MODIFICATION OR ASSISTANCE TO COMPLETE EVALUATION: Min-Moderate modification of tasks or assist with assess necessary to complete an evaluation.  OT OCCUPATIONAL PROFILE AND HISTORY: Detailed assessment: Review of records and additional review of physical, cognitive, psychosocial history related to current functional performance.  CLINICAL DECISION MAKING: Moderate - several treatment options, min-mod task modification necessary  REHAB POTENTIAL: Good  EVALUATION COMPLEXITY: Moderate    PLAN:    No No plan of  care or goals are warranted at this time. Plan to refer Pt. for PT/ST services.   RECOMMENDED OTHER SERVICES: PT/ST  CONSULTED AND AGREED WITH PLAN OF CARE: Patient and family member/caregiver   Olegario Messier, MS, OTR/L  03/11/2023, 1:19 PM

## 2023-03-13 ENCOUNTER — Encounter: Payer: Medicare Other | Admitting: Speech Pathology

## 2023-03-13 ENCOUNTER — Encounter: Payer: Medicare Other | Admitting: Occupational Therapy

## 2023-03-18 ENCOUNTER — Encounter: Payer: Medicare Other | Admitting: Occupational Therapy

## 2023-03-20 ENCOUNTER — Encounter: Payer: Medicare Other | Admitting: Speech Pathology

## 2023-03-20 ENCOUNTER — Encounter: Payer: Medicare Other | Admitting: Occupational Therapy

## 2023-03-22 ENCOUNTER — Encounter: Payer: Medicare Other | Admitting: Speech Pathology

## 2023-03-25 ENCOUNTER — Encounter: Payer: Medicare Other | Admitting: Occupational Therapy

## 2023-03-27 ENCOUNTER — Encounter: Payer: Medicare Other | Admitting: Occupational Therapy

## 2023-03-27 ENCOUNTER — Encounter: Payer: Medicare Other | Admitting: Speech Pathology

## 2023-03-29 ENCOUNTER — Encounter: Payer: Medicare Other | Admitting: Speech Pathology

## 2023-04-01 ENCOUNTER — Encounter: Payer: Medicare Other | Admitting: Occupational Therapy

## 2023-04-03 ENCOUNTER — Encounter: Payer: Medicare Other | Admitting: Occupational Therapy

## 2023-04-03 ENCOUNTER — Encounter: Payer: Medicare Other | Admitting: Speech Pathology

## 2023-04-05 ENCOUNTER — Encounter: Payer: Medicare Other | Admitting: Speech Pathology

## 2023-04-08 ENCOUNTER — Encounter: Payer: Medicare Other | Admitting: Occupational Therapy

## 2023-04-08 ENCOUNTER — Encounter: Payer: Medicare Other | Admitting: Speech Pathology

## 2023-04-12 ENCOUNTER — Inpatient Hospital Stay: Payer: Medicare Other | Attending: Internal Medicine | Admitting: Internal Medicine

## 2023-04-12 ENCOUNTER — Encounter: Payer: Self-pay | Admitting: Internal Medicine

## 2023-04-12 ENCOUNTER — Inpatient Hospital Stay: Payer: Medicare Other

## 2023-04-12 VITALS — BP 130/79 | HR 88 | Temp 98.0°F | Resp 18 | Wt 202.0 lb

## 2023-04-12 DIAGNOSIS — D509 Iron deficiency anemia, unspecified: Secondary | ICD-10-CM

## 2023-04-12 DIAGNOSIS — F32A Depression, unspecified: Secondary | ICD-10-CM | POA: Insufficient documentation

## 2023-04-12 DIAGNOSIS — E538 Deficiency of other specified B group vitamins: Secondary | ICD-10-CM | POA: Diagnosis not present

## 2023-04-12 DIAGNOSIS — K219 Gastro-esophageal reflux disease without esophagitis: Secondary | ICD-10-CM | POA: Insufficient documentation

## 2023-04-12 DIAGNOSIS — E785 Hyperlipidemia, unspecified: Secondary | ICD-10-CM | POA: Insufficient documentation

## 2023-04-12 DIAGNOSIS — G20A1 Parkinson's disease without dyskinesia, without mention of fluctuations: Secondary | ICD-10-CM | POA: Insufficient documentation

## 2023-04-12 DIAGNOSIS — E119 Type 2 diabetes mellitus without complications: Secondary | ICD-10-CM | POA: Diagnosis not present

## 2023-04-12 DIAGNOSIS — Z85528 Personal history of other malignant neoplasm of kidney: Secondary | ICD-10-CM | POA: Diagnosis not present

## 2023-04-12 DIAGNOSIS — G4733 Obstructive sleep apnea (adult) (pediatric): Secondary | ICD-10-CM | POA: Diagnosis not present

## 2023-04-12 DIAGNOSIS — Z9884 Bariatric surgery status: Secondary | ICD-10-CM | POA: Diagnosis not present

## 2023-04-12 DIAGNOSIS — E039 Hypothyroidism, unspecified: Secondary | ICD-10-CM | POA: Diagnosis not present

## 2023-04-12 LAB — CBC WITH DIFFERENTIAL/PLATELET
Abs Immature Granulocytes: 0.07 10*3/uL (ref 0.00–0.07)
Basophils Absolute: 0.1 10*3/uL (ref 0.0–0.1)
Basophils Relative: 1 %
Eosinophils Absolute: 0.2 10*3/uL (ref 0.0–0.5)
Eosinophils Relative: 3 %
HCT: 45.7 % (ref 36.0–46.0)
Hemoglobin: 14.7 g/dL (ref 12.0–15.0)
Immature Granulocytes: 1 %
Lymphocytes Relative: 16 %
Lymphs Abs: 1.6 10*3/uL (ref 0.7–4.0)
MCH: 27 pg (ref 26.0–34.0)
MCHC: 32.2 g/dL (ref 30.0–36.0)
MCV: 84 fL (ref 80.0–100.0)
Monocytes Absolute: 1.1 10*3/uL — ABNORMAL HIGH (ref 0.1–1.0)
Monocytes Relative: 11 %
Neutro Abs: 6.5 10*3/uL (ref 1.7–7.7)
Neutrophils Relative %: 68 %
Platelets: 224 10*3/uL (ref 150–400)
RBC: 5.44 MIL/uL — ABNORMAL HIGH (ref 3.87–5.11)
RDW: 13.2 % (ref 11.5–15.5)
WBC: 9.5 10*3/uL (ref 4.0–10.5)
nRBC: 0 % (ref 0.0–0.2)

## 2023-04-12 LAB — IRON AND TIBC
Iron: 38 ug/dL (ref 28–170)
Saturation Ratios: 9 % — ABNORMAL LOW (ref 10.4–31.8)
TIBC: 409 ug/dL (ref 250–450)
UIBC: 371 ug/dL

## 2023-04-12 LAB — VITAMIN B12: Vitamin B-12: 253 pg/mL (ref 180–914)

## 2023-04-12 LAB — FERRITIN: Ferritin: 34 ng/mL (ref 11–307)

## 2023-04-12 MED ORDER — CYANOCOBALAMIN 1000 MCG/ML IJ SOLN
1000.0000 ug | Freq: Once | INTRAMUSCULAR | Status: AC
Start: 2023-04-12 — End: 2023-04-12
  Administered 2023-04-12: 1000 ug via INTRAMUSCULAR
  Filled 2023-04-12: qty 1

## 2023-04-12 NOTE — Progress Notes (Signed)
Brittany Benton  [phone] [phone]) [phone]  ID: Brittany Benton OB: 1955/11/20  MR#: [id number]  NUU#:[id number]  Patient Care Team: Malva Limes, MD as PCP - General (Family Medicine) Mariah Milling, Tollie Pizza, MD as Consulting Physician (Cardiology) Deeann Saint, MD (Specialist) Pa, [facility]) Venetia Night, MD as Consulting Physician (Neurosurgery) Lonell Face, MD as Consulting Physician (Neurology) Michaelyn Barter, MD as Consulting Physician (Oncology)  HPI: Brittany Benton is a 67 y.o. female with past medical history of diabetes, hyperlipidemia, Parkinson's, GERD, osteoarthritis, OSA, gastric bypass surgery, RCC status post right nephrectomy in 1994 was referred to hematology for iron deficiency anemia.  Patient has a history of gastric sleeve surgery.  Patient reports that in the spring 2023 she was admitted for failure to thrive and then was discharged to rehab facility.  Today her her food intake improved and with physical activity her strength got better.  She was recently diagnosed with Parkinson's disease and follows with Dr. Sherryll Burger.  In past 1 month, she noticed low energy levels and shortness of breath on exertion.  Denies any dizziness.  Denies any black tarry stools, use of NSAIDs.  No prior history of anemia.  Labs from 03/30/2022 showed ferritin 7.  Vitamin A and B1 normal.  TSH normal.  UA negative.  Last colonoscopy was in 2012 which showed internal hemorrhoids.   Completed IV Feraheme x 2 doses in January 2024.  B12 injection 1000 mcg x 4 doses.  Upper endoscopy by Dr. Tobi Bastos from July 2024 showed diffuse severe inflammation in stomach.  Colonoscopy showed multiple polyps.  Pathology was negative for H. pylori.  Hyperplastic polyp from sigmoid colon.  Tubular adenoma from transverse colon.  Repeat was recommended in 5 years.  Capsule endoscopy-poor visualization of proximal small bowel polypoid lesion versus thickened  fold.  Rest appeared normal.  INTERVAL HISTORY-  Patient was seen today as follow-up for labs and iron deficiency anemia and B12 deficiency.. Doing good.  Reports that she has been seeing Dr. Sherryll Burger for tremors but has not been able to get the testing done which were suggested due to some scheduling issues.  Reports energy has improved with B12 injections.  Denies any bleeding in urine or stools.   REVIEW OF SYSTEMS:   Review of Systems  Neurological:  Positive for tremors.    As per HPI. Otherwise, a complete review of systems is negative.  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   GERD (gastroesophageal reflux disease)    Hernia, incisional    after renal surgery   History of chicken pox    Hyperlipidemia    Hypertension    Renal cell carcinoma (HCC)    s/p right nephrectomy 1994   Sleep apnea    Thyroid disease     PAST SURGICAL HISTORY: Past Surgical History:  Procedure Laterality Date   APPENDECTOMY  1994   BIOPSY  11/07/2022   Procedure: BIOPSY;  Surgeon: Wyline Mood, MD;  Location: [facility] ENDOSCOPY;  Service: Gastroenterology;;   BREAST BIOPSY Right 1991   Negative   CHOLECYSTECTOMY  1994   COLONOSCOPY WITH PROPOFOL N/A 11/07/2022   Procedure: COLONOSCOPY WITH PROPOFOL;  Surgeon: Wyline Mood, MD;  Location: [facility] ENDOSCOPY;  Service: Gastroenterology;  Laterality: N/A;   ESOPHAGOGASTRODUODENOSCOPY (  EGD) WITH PROPOFOL N/A 11/07/2022   Procedure: ESOPHAGOGASTRODUODENOSCOPY (EGD) WITH PROPOFOL;  Surgeon: Wyline Mood, MD;  Location[facility] ENDOSCOPY;  Service: Gastroenterology;  Laterality: N/A;   JOINT REPLACEMENT  2019   LAPAROSCOPIC GASTRIC SLEEVE RESECTION  02/02/2016   Dr Alva Garnet at [facility]   NEPHRECTOMY  1994   Renal Cell Carcinoma   parotid gland removal  1990   also removed a tumor    POLYPECTOMY  11/07/2022   Procedure: POLYPECTOMY INTESTINAL;  Surgeon: Wyline Mood, MD;  [facility];  Service: Gastroenterology;;   TONSILLECTOMY     TOTAL HIP ARTHROPLASTY Right 08/08/2016   Procedure: TOTAL HIP ARTHROPLASTY;  Surgeon: Deeann Saint, MD;  Location: [facility] ORS;  Service: Orthopedics;  Laterality: Right;   TUBAL LIGATION  1979    FAMILY HISTORY: Family History  Problem Relation Age of Onset   Osteoporosis Mother    Dementia Mother    Anxiety disorder Mother    Heart disease Father    Heart attack Father    Breast cancer Maternal Aunt    Obesity Maternal Uncle     HEALTH MAINTENANCE: Social History   Tobacco Use   Smoking status: Never   Smokeless tobacco: Never  Vaping Use   Vaping status: Never Used  Substance Use Topics   Alcohol use: Not Currently   Drug use: No     Allergies  Allergen Reactions   Codeine Nausea And Vomiting and Other (See Comments)   Morphine Hives, Swelling and Other (See Comments)   Tape Other (See Comments)    blisters blisters   Tramadol Other (See Comments)    Hallucinations    Current Outpatient Medications  Medication Sig Dispense Refill   Ascorbic Acid (VITAMIN C PO) Take by mouth.     atorvastatin (LIPITOR) 20 MG tablet TAKE 1 TABLET BY MOUTH EVERY EVENING 90 tablet 0   Cholecalciferol (VITAMIN D-3) 1000 units CAPS Take 1 capsule by mouth daily.     imipramine (TOFRANIL) 50 MG tablet TAKE ONE TABLET BY MOUTH TWICE DAILY 180 tablet 3   levothyroxine (SYNTHROID) 50 MCG tablet TAKE ONE TABLET BY MOUTH EVERY DAY 90 tablet 0   metoprolol succinate (TOPROL-XL) 50 MG 24 hr tablet TAKE ONE TABLET BY MOUTH EVERY DAY WITH OR IMMEDIATELY FOLLOWING A MEAL 90 tablet 0   Multiple Vitamins-Minerals (MULTIVITAMIN ADULT PO) Take 1 tablet by mouth daily.     omeprazole (PRILOSEC OTC) 20 MG tablet Take 20 mg by mouth daily.     omeprazole (PRILOSEC) 20 MG capsule TAKE 1 CAPSULE BY MOUTH ONCE DAILY 90 capsule 0   sertraline  (ZOLOFT) 100 MG tablet TAKE ONE TABLET BY MOUTH DAILY 90 tablet 3   No current facility-administered medications for this visit.    OBJECTIVE: Vitals:   04/12/23 1118  BP: 130/79  Pulse: 88  Resp: 18  Temp: 98 F (36.7 C)  SpO2: 100%     Body mass index is 39.45 kg/m.      General: Well-developed, well-nourished, no acute distress. Eyes: Pink conjunctiva, anicteric sclera. HEENT: Normocephalic, moist mucous membranes, clear oropharnyx. Lungs: Clear to auscultation bilaterally. Heart: Regular rate and rhythm. No rubs, murmurs, or gallops. Abdomen: Soft, nontender, nondistended. No organomegaly noted, normoactive bowel sounds. Musculoskeletal: No edema, cyanosis, or clubbing. Neuro: Alert, answering all questions appropriately. Cranial nerves grossly intact. Skin: No rashes or petechiae noted. Psych: Normal affect. Lymphatics: No cervical, calvicular, axillary or inguinal LAD.   LAB RESULTS:  Lab Results  Component Value Date  NA 143 09/05/2021   K 4.1 09/05/2021   CL 108 (H) 09/05/2021   CO2 20 09/05/2021   GLUCOSE 98 09/05/2021   BUN 8 09/05/2021   CREATININE 0.82 09/05/2021   CALCIUM 8.8 09/05/2021   PROT 5.8 (L) 09/05/2021   ALBUMIN 3.5 (L) 09/05/2021   AST 24 09/05/2021   ALT 10 09/05/2021   ALKPHOS 90 09/05/2021   BILITOT 0.4 09/05/2021   GFRNONAA >60 08/06/2021   GFRAA 60 05/15/2019    Lab Results  Component Value Date   WBC 9.5 04/12/2023   NEUTROABS 6.5 04/12/2023   HGB 14.7 04/12/2023   HCT 45.7 04/12/2023   MCV 84.0 04/12/2023   PLT 224 04/12/2023    Lab Results  Component Value Date   TIBC 409 04/12/2023   TIBC 363 01/02/2023   TIBC 431 10/08/2022   FERRITIN 34 04/12/2023   FERRITIN 39 01/02/2023   FERRITIN 27 10/08/2022   IRONPCTSAT 9 (L) 04/12/2023   IRONPCTSAT 20 01/02/2023   IRONPCTSAT 21 10/08/2022     STUDIES: No results found.  ASSESSMENT AND PLAN:   Brittany Benton is a 67 y.o. female with pmh of diabetes,  hyperlipidemia, Parkinson's, GERD, osteoarthritis, OSA, gastric bypass surgery, RCC status post right nephrectomy in 1994 was referred to hematology for iron deficiency anemia.  # Iron deficiency anemia # History of gastric sleeve -Last treated with IV Feraheme in January 2024.  Her iron panel has been normal since.  Iron panel pending from today. -Upper endoscopy by Dr. Tobi Bastos from July 2024 showed diffuse severe inflammation in stomach.  Colonoscopy showed multiple polyps.  Pathology was negative for H. pylori.  Hyperplastic polyp from sigmoid colon.  Tubular adenoma from transverse colon.  Repeat was recommended in 5 years.  Capsule endoscopy-poor visualization of proximal small bowel polypoid lesion versus thickened fold.  Rest appeared normal.  # B12 deficiency -Secondary to gastric sleeve surgery.   -Continue with maintenance B12 injections every 3 months.  Continue with the oral B12 1000 mcg supplements daily. -B12 level pending from today.  # Meningioma  - 1.1 cm meningioma, status post neurosurgery evaluation  # Parkinsonism # Cognition impairment -Follows with Dr. Sherryll Burger neurology.  Was previously on carbidopa levodopa.  Per patient, she has discontinued.  # Hypothyroidism -On Synthroid  # Depression -On sertraline  # Weight gain -Thyroid function test normal.  Orders Placed This Encounter  Procedures   CBC with Differential/Platelet   Ferritin   Iron and TIBC   Vitamin B12   Schedule for B12 injections maintenance every 3 months. RTC in 6 months for MD visit, labs, B12 injection  Patient expressed understanding and was in agreement with this plan. She also understands that She can call clinic at any time with any questions, concerns, or complaints.   I spent a total of 25 minutes reviewing chart data, face-to-face evaluation with the patient, counseling and coordination of care as detailed above.  Michaelyn Barter, MD   04/12/2023 12:45 PM

## 2023-04-12 NOTE — Progress Notes (Signed)
Patient denies any concerns today.  

## 2023-04-15 ENCOUNTER — Inpatient Hospital Stay: Payer: Medicare Other

## 2023-04-15 ENCOUNTER — Inpatient Hospital Stay: Payer: Medicare Other | Admitting: Internal Medicine

## 2023-04-15 ENCOUNTER — Encounter: Payer: Medicare Other | Admitting: Occupational Therapy

## 2023-04-19 ENCOUNTER — Encounter: Payer: Medicare Other | Admitting: Speech Pathology

## 2023-04-22 ENCOUNTER — Encounter: Payer: Medicare Other | Admitting: Occupational Therapy

## 2023-04-22 ENCOUNTER — Encounter: Payer: Medicare Other | Admitting: Speech Pathology

## 2023-04-24 ENCOUNTER — Encounter: Payer: Medicare Other | Admitting: Occupational Therapy

## 2023-04-24 ENCOUNTER — Encounter: Payer: Medicare Other | Admitting: Speech Pathology

## 2023-04-26 ENCOUNTER — Telehealth: Payer: Self-pay | Admitting: Family Medicine

## 2023-04-26 DIAGNOSIS — E039 Hypothyroidism, unspecified: Secondary | ICD-10-CM

## 2023-04-26 DIAGNOSIS — I10 Essential (primary) hypertension: Secondary | ICD-10-CM

## 2023-04-26 DIAGNOSIS — E785 Hyperlipidemia, unspecified: Secondary | ICD-10-CM

## 2023-04-26 DIAGNOSIS — F410 Panic disorder [episodic paroxysmal anxiety] without agoraphobia: Secondary | ICD-10-CM

## 2023-04-29 ENCOUNTER — Encounter: Payer: Self-pay | Admitting: Internal Medicine

## 2023-04-29 ENCOUNTER — Encounter: Payer: Medicare Other | Admitting: Speech Pathology

## 2023-04-29 ENCOUNTER — Telehealth: Payer: Self-pay

## 2023-04-29 ENCOUNTER — Encounter: Payer: Medicare Other | Admitting: Occupational Therapy

## 2023-04-29 NOTE — Telephone Encounter (Signed)
 Requested Prescriptions  Pending Prescriptions Disp Refills   omeprazole  (PRILOSEC) 20 MG capsule [Pharmacy Med Name: OMEPRAZOLE  DR 20 MG CAP] 90 capsule 0    Sig: TAKE 1 CAPSULE BY MOUTH ONCE DAILY     Gastroenterology: Proton Pump Inhibitors Passed - 04/29/2023  3:40 PM      Passed - Valid encounter within last 12 months    Recent Outpatient Visits           1 year ago Frequent urination   [facility] Nancyann BRAVO, MD   1 year ago Moderate protein-calorie malnutrition [facility])   [place] [facility] Nancyann BRAVO, MD   1 year ago Lethargy   [place] [facility] Nancyann BRAVO, MD   1 year ago Adverse effect of drug, initial encounter   [place] [facility] Nancyann BRAVO, MD   1 year ago Meningioma [facility])   [place] [facility] Nancyann BRAVO, MD               atorvastatin  (LIPITOR) 20 MG tablet [Pharmacy Med Name: ATORVASTATIN  CALCIUM  20 MG TAB] 90 tablet 0    Sig: TAKE 1 TABLET BY MOUTH EVERY EVENING     Cardiovascular:  Antilipid - Statins Failed - 04/29/2023  3:40 PM      Failed - Lipid Panel in normal range within the last 12 months    Cholesterol, Total  Date Value Ref Range Status  08/04/2020 120 100 - 199 mg/dL Final   LDL Chol Calc (NIH)  Date Value Ref Range Status  08/04/2020 55 0 - 99 mg/dL Final   HDL  Date Value Ref Range Status  08/04/2020 45 >39 mg/dL Final   Triglycerides  Date Value Ref Range Status  08/04/2020 111 0 - 149 mg/dL Final         Passed - Patient is not pregnant      Passed - Valid encounter within last 12 months    Recent Outpatient Visits           1 year ago Frequent urination   [place] [facility] Nancyann BRAVO, MD   1 year ago Moderate protein-calorie malnutrition [facility])   [place] [facility] Nancyann BRAVO, MD   1 year ago Lethargy   [place] [facility] Gas[facility] Nancyann BRAVO, MD   1 year ago Adverse effect of drug, initial encounter   [facility] Gas[facility] Nancyann BRAVO, MD   1 year ago Meningioma [facility])    [facility] Nancyann BRAVO, MD               metoprolol  succinate (TOPROL -XL) 50 MG 24 hr tablet [Pharmacy Med Name: METOPROLOL  SUCCINATE ER 50 MG TAB] 90 tablet 0    Sig: TAKE ONE TABLET BY MOUTH EVERY DAY WITH OR IMMEDIATELY FOLLOWING A MEAL     Cardiovascular:  Beta Blockers Passed - 04/29/2023  3:40 PM      Passed - Last BP in normal range    BP Readings from Last 1 Encounters:  04/12/23 130/79         Passed - Last Heart Rate in normal range    Pulse Readings from Last 1 Encounters:  04/12/23 88         Passed - Valid encounter within last 6 months    Recent Outpatient Visits  1 year ago Frequent urination   [place] [facility] Nancyann BRAVO, MD   1 year ago Moderate protein-calorie malnutrition [facility])   [facility] Gas[facility] Nancyann BRAVO, MD   1 year ago Lethargy   [place] [facility] Nancyann BRAVO, MD   1 year ago Adverse effect of drug, initial encounter   [facility] Gas[facility] Nancyann BRAVO, MD   1 year ago Meningioma [place])   [place] [facility] Gas[facility] Nancyann BRAVO, MD               levothyroxine  (SYNTHROID ) 50 MCG tablet [Pharmacy Med Name: LEVOTHYROXINE  SODIUM 50 MCG TAB] 90 tablet 0    Sig: TAKE ONE TABLET BY MOUTH EVERY DAY     Endocrinology:  Hypothyroid Agents Passed - 04/29/2023  3:40 PM      Passed - TSH in normal range and within 360 days    TSH  Date Value Ref Range Status  10/08/2022 2.409 0.350 - 4.500 uIU/mL Final    Comment:    Performed by a 3rd Generation assay with a functional sensitivity of <=0.01 uIU/mL. Performed at [facility], [address] [place], [place] [address]   03/30/2022 2.810 0.450 - 4.500  uIU/mL Final         Passed - Valid encounter within last 12 months    Recent Outpatient Visits           1 year ago Frequent urination   [place] [facility] Nancyann BRAVO, MD   1 year ago Moderate protein-calorie malnutrition [facility])   [place] Gas[facility] Nancyann BRAVO, MD   1 year ago Lethargy   [place] [facility] Gas[facility] Nancyann BRAVO, MD   1 year ago Adverse effect of drug, initial encounter   [place] [facility] Gas[facility] Nancyann BRAVO, MD   1 year ago Meningioma [facility])   [place] [facility] Gas[facility] Nancyann BRAVO, MD               sertraline  (ZOLOFT ) 100 MG tablet [Pharmacy Med Name: SERTRALINE  HCL 100 MG TAB] 90 tablet 3    Sig: TAKE ONE TABLET BY MOUTH DAILY     Psychiatry:  Antidepressants - SSRI - sertraline  Failed - 04/29/2023  3:40 PM      Failed - AST in normal range and within 360 days    AST  Date Value Ref Range Status  09/05/2021 24 0 - 40 IU/L Final         Failed - ALT in normal range and within 360 days    ALT  Date Value Ref Range Status  09/05/2021 10 0 - 32 IU/L Final         Passed - Completed PHQ-2 or PHQ-9 in the last 360 days      Passed - Valid encounter within last 6 months    Recent Outpatient Visits           1 year ago Frequent urination   [place] [facility] Nancyann BRAVO, MD   1 year ago Moderate protein-calorie malnutrition [facility])   [place] [facility] Gas[facility] Nancyann BRAVO, MD   1 year ago Lethargy   [place] [facility] Gas[facility] Nancyann BRAVO, MD   1 year ago Adverse effect of drug, initial encounter   [facility] Gas[facility] Nancyann BRAVO, MD   1  year ago Meningioma [facility])   [place] [facility] Gasper, Nancyann BRAVO, MD

## 2023-04-29 NOTE — Telephone Encounter (Signed)
 Spoke with patient in regards to the following message from Dr. Clista: Iron level is looking slightly lower than last time. She has previously received IV Feraheme .  We can schedule her for 1 dose of ferraheme.  B12 level is borderline low at 253. I think we will have to move her B12 injection frequency from current every 3 months to every 2 months. And I let scheduler Heron know.

## 2023-04-29 NOTE — Telephone Encounter (Signed)
 Requested medication (s) are due for refill today yes/no  Requested medication (s) are on the active medication list -yes  Future visit scheduled -no  Last refill: Atorvastatin - 01/22/23 #90- fails lab protocol-over 1 year-08/04/20                 Sertraline - 07/25/22 #90 3RF- too soon, fails lab protocol - over 1 year-09/05/21  Notes to clinic: see above- patient may also be due appointment   Requested Prescriptions  Pending Prescriptions Disp Refills   atorvastatin  (LIPITOR) 20 MG tablet [Pharmacy Med Name: ATORVASTATIN  CALCIUM  20 MG TAB] 90 tablet 0    Sig: TAKE 1 TABLET BY MOUTH EVERY EVENING     Cardiovascular:  Antilipid - Statins Failed - 04/29/2023  3:40 PM      Failed - Lipid Panel in normal range within the last 12 months    Cholesterol, Total  Date Value Ref Range Status  08/04/2020 120 100 - 199 mg/dL Final   LDL Chol Calc (NIH)  Date Value Ref Range Status  08/04/2020 55 0 - 99 mg/dL Final   HDL  Date Value Ref Range Status  08/04/2020 45 >39 mg/dL Final   Triglycerides  Date Value Ref Range Status  08/04/2020 111 0 - 149 mg/dL Final         Passed - Patient is not pregnant      Passed - Valid encounter within last 12 months    Recent Outpatient Visits           1 year ago Frequent urination   [place] [facility] Gasper Nancyann BRAVO, MD   1 year ago Moderate protein-calorie malnutrition (HCC)   [place] [facility] Gasper Nancyann BRAVO, MD   1 year ago Lethargy   [place] [facility] Gasper Nancyann BRAVO, MD   1 year ago Adverse effect of drug, initial encounter   [place] [facility] Gasper Nancyann BRAVO, MD   1 year ago Meningioma [facility])   [place] [facility] Gasper Nancyann BRAVO, MD               sertraline  (ZOLOFT ) 100 MG tablet [Pharmacy Med Name: SERTRALINE  HCL 100 MG TAB] 90 tablet 3    Sig: TAKE ONE TABLET BY MOUTH DAILY     Psychiatry:  Antidepressants - SSRI -  sertraline  Failed - 04/29/2023  3:40 PM      Failed - AST in normal range and within 360 days    AST  Date Value Ref Range Status  09/05/2021 24 0 - 40 IU/L Final         Failed - ALT in normal range and within 360 days    ALT  Date Value Ref Range Status  09/05/2021 10 0 - 32 IU/L Final         Passed - Completed PHQ-2 or PHQ-9 in the last 360 days      Passed - Valid encounter within last 6 months    Recent Outpatient Visits           1 year ago Frequent urination   [place] [facility] Gasper Nancyann BRAVO, MD   1 year ago Moderate protein-calorie malnutrition [facility])   [place] [facility] Gasper Nancyann BRAVO, MD   1 year ago Lethargy   [place] [facility] Gasper Nancyann BRAVO, MD   1 year ago Adverse effect of drug, initial encounter   [facility]  G[place] Nancyann BRAVO, MD   1 year ago Meningioma [facility])   [place] [facility] Gasper Nancyann BRAVO, MD              Signed Prescriptions Disp Refills   omeprazole  (PRILOSEC) 20 MG capsule 90 capsule 0    Sig: TAKE 1 CAPSULE BY MOUTH ONCE DAILY     Gastroenterology: Proton Pump Inhibitors Passed - 04/29/2023  3:40 PM      Passed - Valid encounter within last 12 months    Recent Outpatient Visits           1 year ago Frequent urination   [place] [facility] Gasper Nancyann BRAVO, MD   1 year ago Moderate protein-calorie malnutrition [facility])   [place] [facility] Gasper Nancyann BRAVO, MD   1 year ago Lethargy   [place] [facility] Gasper Nancyann BRAVO, MD   1 year ago Adverse effect of drug, initial encounter   [facility] Gas[facility] Nancyann BRAVO, MD   1 year ago Meningioma [facility])   [place] [facility] Gasper Nancyann BRAVO, MD               metoprolol  succinate (TOPROL -XL) 50 MG 24 hr tablet 90 tablet 0    Sig: TAKE ONE TABLET BY MOUTH EVERY DAY WITH OR  IMMEDIATELY FOLLOWING A MEAL     Cardiovascular:  Beta Blockers Passed - 04/29/2023  3:40 PM      Passed - Last BP in normal range    BP Readings from Last 1 Encounters:  04/12/23 130/79         Passed - Last Heart Rate in normal range    Pulse Readings from Last 1 Encounters:  04/12/23 88         Passed - Valid encounter within last 6 months    Recent Outpatient Visits           1 year ago Frequent urination   [place] Gasper Nancyann BRAVO, MD   1 year ago Moderate protein-calorie malnutrition [facility])   [place] [facility] Gasper Nancyann BRAVO, MD   1 year ago Lethargy   [place] [facility] Gas[facility] Nancyann BRAVO, MD   1 year ago Adverse effect of drug, initial encounter   [facility] Gasper Nancyann BRAVO, MD   1 year ago [facility]eningioma [facility])   [place] [facility] Gasper Nancyann BRAVO, MD               levothyroxine  (SYNTHROID ) 50 MCG tablet 90 tablet 0    Sig: TAKE ONE TABLET BY MOUTH EVERY DAY     Endocrinology:  Hypothyroid Agents Passed - 04/29/2023  3:40 PM      Passed - TSH in normal range and within 360 days    TSH  Date Value Ref Range Status  10/08/2022 2.409 0.350 - 4.500 uIU/mL Final    Comment:    Performed by a 3rd Generation assay with a functional sensitivity of <=0.01 uIU/mL. Performed at [facility], [address] [place], [place] [address]   03/30/2022 2.810 0.450 - 4.500 uIU/mL Final         Passed - Valid encounter within last 12 months    Recent Outpatient Visits           1 year ago Frequent urination   [place] [facility] Gasper Nancyann BRAVO, MD  1 year ago Moderate protein-calorie malnutrition (HCC)   [place] [facility] Gasper Nancyann BRAVO, MD   1 year ago Lethargy   [place] [facility] Gasper Nancyann BRAVO, MD   1 year ago Adverse effect of drug, initial encounter   [facility] Gasper Nancyann BRAVO, MD   1 year ago Meningioma [facility])   [place] Gasper Nancyann BRAVO, MD                 Requested Prescriptions  Pending Prescriptions Disp Refills   atorvastatin  (LIPITOR) 20 MG tablet [Pharmacy Med Name: ATORVASTATIN  CALCIUM  20 MG TAB] 90 tablet 0    Sig: TAKE 1 TABLET BY MOUTH EVERY EVENING     Cardiovascular:  Antilipid - Statins Failed - 04/29/2023  3:40 PM      Failed - Lipid Panel in normal range within the last 12 months    Cholesterol, Total  Date Value Ref Range Status  08/04/2020 120 100 - 199 mg/dL Final   LDL Chol Calc (NIH)  Date Value Ref Range Status  08/04/2020 55 0 - 99 mg/dL Final   HDL  Date Value Ref Range Status  08/04/2020 45 >39 mg/dL Final   Triglycerides  Date Value Ref Range Status  08/04/2020 111 0 - 149 mg/dL Final         Passed - Patient is not pregnant      Passed - Valid encounter within last 12 months    Recent Outpatient Visits           1 year ago Frequent urination   [place] [facility] Gasper Nancyann BRAVO, MD   1 year ago Moderate protein-calorie malnutrition [facility])   [place] [facility] B[facility]VO, MD   1 year ago Lethargy   [place] [facility] Gasper Nancyann BRAVO, MD   1 year ago Adverse effect of drug, initial encounter   [place] [facility] Gasper Nancyann BRAVO, MD   1 year ago Meningioma [facility])   [place] [facility] Gasper Nancyann BRAVO, MD               sertraline  (ZOLOFT ) 100 MG tablet [Pharmacy Med Name: SERTRALINE  HCL 100 MG TAB] 90 tablet 3    Sig: TAKE ONE TABLET BY MOUTH DAILY     Psychiatry:  Antidepressants - SSRI - sertraline  Failed - 04/29/2023  3:40 PM      Failed - AST in normal range and within 360 days    AST  Date Value Ref Range Status  09/05/2021 24 0 - 40 IU/L Final         Failed - ALT in normal range and within 360 days    ALT   Date Value Ref Range Status  09/05/2021 10 0 - 32 IU/L Final         Passed - Completed PHQ-2 or PHQ-9 in the last 360 days      Passed - Valid encounter within last 6 months    Recent Outpatient Visits           1 year ago Frequent urination   [place] [facility] Gasper Nancyann BRAVO, MD   1 year ago Moderate protein-calorie malnutrition [facility])   [place] [facility] Gasper Nancyann BRAVO, MD   1 year ago Lethargy   [place] [facility] Gasper Nancyann BRAVO, MD   1 year ago  Adverse effect of drug, initial encounter   [facility] Gas[facility] Nancyann BRAVO, MD   1 year ago Meningioma [facility])   [place] [facility] Gasper Nancyann BRAVO, MD              Signed Prescriptions Disp Refills   omeprazole  (PRILOSEC) 20 MG capsule 90 capsule 0    Sig: TAKE 1 CAPSULE BY MOUTH ONCE DAILY     Gastroenterology: Proton Pump Inhibitors Passed - 04/29/2023  3:40 PM      Passed - Valid encounter within last 12 months    Recent Outpatient Visits           1 year ago Frequent urination   [place] [facility] Gasper Nancyann BRAVO, MD   1 year ago Moderate protein-calorie malnutrition [facility])   [place] [facility] Gasper Nancyann BRAVO, MD   1 year ago Lethargy   [place] [facility] Gasper Nancyann BRAVO, MD   1 year ago Adverse effect of drug, initial encounter   [facility] Gasper Nancyann BRAVO, MD   1 year ago Meningioma [facility])   [place] [facility] Gasper Nancyann BRAVO, MD               metoprolol  succinate (TOPROL -XL) 50 MG 24 hr tablet 90 tablet 0    Sig: TAKE ONE TABLET BY MOUTH EVERY DAY WITH OR IMMEDIATELY FOLLOWING A MEAL     Cardiovascular:  Beta Blockers Passed - 04/29/2023  3:40 PM      Passed - Last BP in normal range    BP Readings from Last 1 Encounters:  04/12/23 130/79         Passed - Last Heart Rate in normal  range    Pulse Readings from Last 1 Encounters:  04/12/23 88         Passed - Valid encounter within last 6 months    Recent Outpatient Visits           1 year ago Frequent urination   [place] [facility] Gasper Nancyann BRAVO, MD   1 year ago Moderate protein-calorie [facility])   [place] [facility] Gasper Nancyann BRAVO, MD   1 year ago Lethargy   [place] [facility] Gasper Nancyann BRAVO, MD   1 year ago Adverse effect of drug, initial encounter   [place] [facility] Gasper Nancyann BRAVO, MD   1 year ago Meningioma [facility])   [place] [facility] Gasper Nancyann BRAVO, MD               levothyroxine  (SYNTHROID ) 50 MCG tablet 90 tablet 0    Sig: TAKE ONE TABLET BY MOUTH EVERY DAY     Endocrinology:  Hypothyroid Agents Passed - 04/29/2023  3:40 PM      Passed - TSH in normal range and within 360 days    TSH  Date Value Ref Range Status  10/08/2022 2.409 0.350 - 4.500 uIU/mL Final    Comment:    Performed by a 3rd Generation assay with a functional sensitivity of <=0.01 uIU/mL. Performed at [facility], [address] [place], [place] [address]   03/30/2022 2.810 0.450 - 4.500 uIU/mL Final         Passed - Valid encounter within last 12 months    Recent Outpatient Visits           1 year ago Frequent  urination   [place] [facility] Gasper Nancyann BRAVO, MD   1 year ago Moderate protein-calorie malnutrition [facility])   [place] [facility] Gasper Nancyann BRAVO, MD   1 year ago Lethargy   [place] [facility] Gasper Nancyann BRAVO, MD   1 year ago Adverse effect of drug, initial encounter   [facility] Gasper Nancyann BRAVO, MD   1 year ago Meningioma [facility])   [facility] Gasper Nancyann BRAVO, MD

## 2023-04-30 NOTE — Telephone Encounter (Signed)
 Patient not seen since 2023. Needs to schedule appointment before refill can be approved.

## 2023-05-01 ENCOUNTER — Encounter: Payer: Medicare Other | Admitting: Speech Pathology

## 2023-05-01 ENCOUNTER — Inpatient Hospital Stay: Payer: Medicare Other | Attending: Internal Medicine

## 2023-05-01 ENCOUNTER — Encounter: Payer: Medicare Other | Admitting: Occupational Therapy

## 2023-05-01 VITALS — BP 133/68 | HR 71 | Temp 97.4°F

## 2023-05-01 DIAGNOSIS — D509 Iron deficiency anemia, unspecified: Secondary | ICD-10-CM | POA: Diagnosis not present

## 2023-05-01 DIAGNOSIS — E538 Deficiency of other specified B group vitamins: Secondary | ICD-10-CM | POA: Diagnosis not present

## 2023-05-01 MED ORDER — SODIUM CHLORIDE 0.9 % IV SOLN
INTRAVENOUS | Status: DC | PRN
Start: 2023-05-01 — End: 2023-05-01
  Filled 2023-05-01: qty 250

## 2023-05-01 MED ORDER — SODIUM CHLORIDE 0.9 % IV SOLN
510.0000 mg | Freq: Once | INTRAVENOUS | Status: AC
  Administered 2023-05-01: 510 mg via INTRAVENOUS
  Filled 2023-05-01: qty 510

## 2023-05-03 NOTE — Telephone Encounter (Signed)
Letter sent via mychart advising office visit needed

## 2023-05-06 ENCOUNTER — Encounter: Payer: Medicare Other | Admitting: Occupational Therapy

## 2023-05-06 ENCOUNTER — Encounter: Payer: Medicare Other | Admitting: Speech Pathology

## 2023-05-08 ENCOUNTER — Encounter: Payer: Medicare Other | Admitting: Occupational Therapy

## 2023-05-08 ENCOUNTER — Encounter: Payer: Medicare Other | Admitting: Speech Pathology

## 2023-05-13 ENCOUNTER — Encounter: Payer: Medicare Other | Admitting: Occupational Therapy

## 2023-05-13 ENCOUNTER — Encounter: Payer: Medicare Other | Admitting: Speech Pathology

## 2023-05-15 ENCOUNTER — Encounter: Payer: Medicare Other | Admitting: Occupational Therapy

## 2023-05-15 ENCOUNTER — Encounter: Payer: Medicare Other | Admitting: Speech Pathology

## 2023-05-20 ENCOUNTER — Encounter: Payer: Medicare Other | Admitting: Occupational Therapy

## 2023-05-20 ENCOUNTER — Encounter: Payer: Medicare Other | Admitting: Speech Pathology

## 2023-05-22 ENCOUNTER — Encounter: Payer: Medicare Other | Admitting: Occupational Therapy

## 2023-05-22 ENCOUNTER — Encounter: Payer: Medicare Other | Admitting: Speech Pathology

## 2023-05-27 ENCOUNTER — Encounter: Payer: Medicare Other | Admitting: Speech Pathology

## 2023-05-27 ENCOUNTER — Encounter: Payer: Medicare Other | Admitting: Occupational Therapy

## 2023-05-28 ENCOUNTER — Ambulatory Visit: Payer: Medicare Other | Attending: Neurology | Admitting: Speech Pathology

## 2023-05-28 ENCOUNTER — Other Ambulatory Visit: Payer: Self-pay

## 2023-05-28 ENCOUNTER — Encounter: Payer: Self-pay | Admitting: Physical Therapy

## 2023-05-28 ENCOUNTER — Other Ambulatory Visit: Payer: Self-pay | Admitting: Family Medicine

## 2023-05-28 ENCOUNTER — Ambulatory Visit: Payer: Medicare Other | Admitting: Physical Therapy

## 2023-05-28 DIAGNOSIS — R296 Repeated falls: Secondary | ICD-10-CM | POA: Diagnosis not present

## 2023-05-28 DIAGNOSIS — R471 Dysarthria and anarthria: Secondary | ICD-10-CM | POA: Diagnosis not present

## 2023-05-28 DIAGNOSIS — R278 Other lack of coordination: Secondary | ICD-10-CM | POA: Insufficient documentation

## 2023-05-28 DIAGNOSIS — R41841 Cognitive communication deficit: Secondary | ICD-10-CM | POA: Insufficient documentation

## 2023-05-28 DIAGNOSIS — E785 Hyperlipidemia, unspecified: Secondary | ICD-10-CM

## 2023-05-28 DIAGNOSIS — R262 Difficulty in walking, not elsewhere classified: Secondary | ICD-10-CM

## 2023-05-28 DIAGNOSIS — M6281 Muscle weakness (generalized): Secondary | ICD-10-CM | POA: Diagnosis not present

## 2023-05-28 DIAGNOSIS — F410 Panic disorder [episodic paroxysmal anxiety] without agoraphobia: Secondary | ICD-10-CM

## 2023-05-28 NOTE — Therapy (Signed)
OUTPATIENT SPEECH LANGUAGE PATHOLOGY  TREATMENT NOTE/DISCHARGE SUMMARY  Patient Name: Brittany Benton MRN: [id number] DOB:05-Jun-1955, 68 y.o., female Today's Date: 05/28/2023  PCP: Mila Merry, MD REFERRING PROVIDER: Cristopher Peru, MD   End of Session - 05/28/23 1702     Visit Number 2    Number of Visits 2    Date for SLP Re-Evaluation 05/28/23    Authorization Type [employer]    Progress Note Due on Visit 10    SLP Start Time 1445    SLP Stop Time  1530    SLP Time Calculation (min) 45 min    Activity Tolerance Patient tolerated treatment well             Past Medical History:  Diagnosis Date   Allergy 1989   Anemia    Anxiety    Arthritis    osteoarthritis -right hip   Cataract    Depression    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   GERD (gastroesophageal reflux disease)    Hernia, incisional    after renal surgery   History of chicken pox    Hyperlipidemia    Hypertension    Renal cell carcinoma (HCC)    s/p right nephrectomy 1994   Sleep apnea    Thyroid disease    Past Surgical History:  Procedure Laterality Date   APPENDECTOMY  1994   BIOPSY  11/07/2022   Procedure: BIOPSY;  Surgeon: Wyline Mood, MD;  [facility];  Service: Gastroenterology;;   BREAST BIOPSY Right 1991   Negative   CHOLECYSTECTOMY  1994   COLONOSCOPY WITH PROPOFOL N/A 11/07/2022   Procedure: COLONOSCOPY WITH PROPOFOL;  Surgeon: Wyline Mood, MD;  Location: [facility] ENDOSCOPY;  Service: Gastroenterology;  Laterality: N/A;   ESOPHAGOGASTRODUODENOSCOPY (EGD) WITH PROPOFOL N/A 11/07/2022   Procedure: ESOPHAGOGASTRODUODENOSCOPY (EGD) WITH PROPOFOL;  Surgeon: Wyline Mood, MD;  [facility];  Service: Gastroenterology;  Laterality: N/A;   JOINT REPLACEMENT  2019   LAPAROSCOPIC GASTRIC SLEEVE RESECTION  02/02/2016   [facility]   NEPHRECTOMY  1994   Renal Cell Carcinoma   parotid gland removal  1990   also removed a tumor    POLYPECTOMY  11/07/2022   Procedure: POLYPECTOMY INTESTINAL;  Surgeon: Wyline Mood, MD;  [facility];  Service: Gastroenterology;;   TONSILLECTOMY     TOTAL HIP ARTHROPLASTY Right 08/08/2016   Procedure: TOTAL HIP ARTHROPLASTY;  Surgeon: Deeann Saint, MD;  Location: [facility] ORS;  Service: Orthopedics;  Laterality: Right;   TUBAL LIGATION  1979   Patient Active Problem List   Diagnosis Date Noted   B12 deficiency 06/11/2022   Iron deficiency anemia 04/10/2022   Morbid obesity (HCC) 03/30/2022   Vitamin A deficiency 08/15/2021   Parkinson's disease (HCC) 08/03/2021   Acute urinary retention 08/03/2021   Meningioma (HCC) 04/05/2021   Hypothyroidism 08/15/2020   Osteopenia 06/14/2020   Status post total hip replacement, 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   Osteoarthritis of right hip 03/26/2016   H/O gastric bypass 03/26/2016   Sinus tachycardia 08/01/2015   Obesity (BMI 30-39.9) 06/08/2015   OSA (obstructive sleep apnea) 06/08/2015   GERD without esophagitis 06/08/2015   Incisional hernia 06/08/2015   Arthritis of hip 03/23/2015   Left knee pain 02/15/2015   Vitamin D deficiency 09/29/2014   Lump of right breast 09/22/2014   Prediabetes 09/22/2014   Pruritic dermatitis 09/22/2014   Obstructive sleep apnea 09/22/2014   Allergic rhinitis 09/15/2009   Hypersomnia 09/14/2009   Panic disorder 08/06/2008  Essential (primary) hypertension 07/16/2007   Hyperlipidemia 07/16/2007    ONSET DATE: 02/26/2023 date of referral with referral received on 05/08/2023  REFERRING DIAG: G20.C (ICD-10-CM) - Parkinsonism, unspecified; R41.841 (ICD-10-CM) - Cognitive communication deficit   THERAPY DIAG:  Cognitive communication deficit  Dysarthria and anarthria  Rationale for Evaluation and Treatment Rehabilitation  SUBJECTIVE:   PERTINENT HISTORY and DIAGNOSTIC FINDINGS:   Pt is a 68 year old right handed female with Concern for Parkinsonism in a patient with resting and tremors tremors (right  > left) with occasional whole-body jerks/myoclonus, bradykinesia, hallucinations while waking up, bilateral bradykinesia (left > right), minimal cogwheeling bilaterally, sensation of stiffness, speech changes, imbalance.   Cognitive impairment, likely secondary malnutrition, iron deficiency, Vitamin B12 deficiency with possible component of Parkinsonism in a patient with generalized atrophy CT Head without Contrast 04/03/2021: Chronic atrophic changes without acute abnormality.   Hisotry of obstructive sleep apnea with Mallampati score IV (home sleep study 07/11/2022 - mild obstructive sleep apnea - pt deferred CPAP), hisotry of moderate protein-calorie malnutrition with hospitalization (08/02/2021-08/06/2021) with discharge to SNF for 3 weeks. Pt states that she received ST service while at [facility] for her voice/speech.   PAIN:  Are you having pain? No  FALLS: Has patient fallen in last 6 months?  No  LIVING ENVIRONMENT: Lives with: lives with their spouse Lives in: House/apartment  PLOF:  Level of assistance: Needed assistance with ADLs, Needed assistance with IADLS Employment: On disability  PATIENT GOALS    to improve her speech  SUBJECTIVE STATEMENT: Pt returns to therapy after 2 months d/t desire for LSVT LOUD Pt accompanied by: significant other  OBJECTIVE:  TODAY'S TREATMENT Skilled treatment session targeted pt's cognitive communication goals. SLP facilitated the session by providing the following interventions:  Motivational interviewing provided to establish current concerns and goals. Pt reports "he yells at me all day long to repeat repeat repeat." Pt's husband with severe hearing loss and despite being aided, he reports needing to be within arms length of people and have their face visible to improve his hearing. He also reports that he requires someone getting his attention prior to speaking with him. This information was reviewed with recommendations made during  initial evaluation. Per pt report she grows tired of "having to do this with him." She also states that she isn't able to have "small talk" with him while watching TV, with him in the kitchen while he performs ADLs etc. Education provided on nature and severity of hearing loss.   Pt tended to fluctuate among various complaints regarding her husband. For example she states, "I think he should know me by now, I never leave the house without my water, and what happens today, he doesn't remember by water." Pt states that she is capable of getting and taking her own water with her but states "I am high maintenance, I know that I am." She also reports that she fall last Thursday "and I had to lay there for 2 hours before he heard me, I throw things at the wall and he still wouldn't come." Pt's husband provides that they sleep in separate bedrooms and given that it was in the middle of night, he didn't have his hearing aids in. Recommend pt getting a life alert. Pt had difficulty processing this recommendation as she tended to perseverate on "it took him 2 hours to help me."   When describing her typical day, she reports sleeping for ~ 12 hours per night, sitting in bed for an  hour, "got o den and sit," not eating until later in the day (husband states that pt had not eaten today current time 1500), napping (commented "I love naps") and sitting in den for remainder of the day. When asked about increasing participation in housework or meal prep, pt commented "why would I do it when he will take care of it." When making recommendations for ways to increase pt participation in ADLs/iADLs, pt transferred responsibility to her husband and/or voiced complaint involving her husband.   Throughout the session pt's speech intelligibility was > 95% with vocal intensity of ~ 75 dB.   PATIENT EDUCATION: Education details: behavioral health counseling Person educated: Patient and Spouse Education method:  Explanation Education comprehension: needs further education    GOALS: Goals reviewed with patient? Yes  SHORT TERM GOALS: Target date: 10 sessions  With Min A, patient will demo HEP for motor speech accurately in 5/7 opportunities.  Baseline: Goal status: INITIAL: not met  2.  To determine optimal resistance levels for Respiratory Muscle Training (RMT) for improving increase hyolaryngeal elevation and strengthen cough for airway clearance, patient will participate in evaluation (and re-assessment as needed) of maximum expiratory pressure (MEP). Baseline:  Goal status: INITIAL: not met  3.  With Min A, patient will maintain adequate vocal quality and intensity (>70dB) in sentence level responses in 5 of 7 opportunities.  Baseline:  Goal status: INITIAL: MET   LONG TERM GOALS: Target date: 05/06/2023  With Rare Min A, patient will identify spontaneous response below WNL with min nonverbal cues and attempt self-correction in >75% of opportunities.  Baseline:  Goal status: INITIAL: NOT MET  2.  With Rare Min A, patient will maintain adequate vocal quality and endurance in 20+ minute conversation outside ST room over 3 sessions.  Baseline:  Goal status: INITIAL: NOT MET   ASSESSMENT:  CLINICAL IMPRESSION: Pt seen today for cognitive communication treatment session. At this time, pt is not considered a candidate for further therapy d/t expressed decreased desire to participate in ADLs/iADLs. Recommend behavioral health evaluation for captiousness.   OBJECTIVE IMPAIRMENTS include expressive language and dysarthria. These impairments are limiting patient from effectively communicating at home and in community. Factors affecting potential to achieve goals and functional outcome are medical prognosis, previous level of function, and family/community support. Patient will benefit from skilled SLP services to address above impairments and improve overall function.  REHAB  POTENTIAL:Poor  PLAN: Pt with decreased responsiveness to therapy recommendations and would not benefit from further services at this time.    Alencia Gordon B. Dreama Saa, M.S., CCC-SLP, Tree surgeon Certified Brain Injury Specialist [facility] [place]  [facility] Rehabilitation Services Office [phone] Ascom [phone] Fax [phone]

## 2023-05-28 NOTE — Therapy (Signed)
OUTPATIENT PHYSICAL THERAPY NEURO EVALUATION   Patient Name: Brittany Benton MRN: [id number] DOB:July 27, 1955, 68 y.o., female Today's Date: 05/28/2023   PCP: Malva Limes, MD REFERRING PROVIDER: Lonell Face, MD   END OF SESSION:   PT End of Session - 05/28/23 1619     Visit Number 1    Number of Visits 24    Date for PT Re-Evaluation 08/20/23    Authorization Type [employer] Medicare 2025    PT Start Time 1538    PT Stop Time 1617    PT Time Calculation (min) 39 min    Equipment Utilized During Treatment Gait belt    Activity Tolerance Patient tolerated treatment well    Behavior During Therapy WFL for tasks assessed/performed             Past Medical History:  Diagnosis Date   Allergy 1989   Anemia    Anxiety    Arthritis    osteoarthritis -right hip   Cataract    Depression    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   GERD (gastroesophageal reflux disease)    Hernia, incisional    after renal surgery   History of chicken pox    Hyperlipidemia    Hypertension    Renal cell carcinoma (HCC)    s/p right nephrectomy 1994   Sleep apnea    Thyroid disease    Past Surgical History:  Procedure Laterality Date   APPENDECTOMY  1994   BIOPSY  11/07/2022   Procedure: BIOPSY;  Surgeon: Wyline Mood, MD;  Location: [facility] ENDOSCOPY;  Service: Gastroenterology;;   BREAST BIOPSY Right 1991   Negative   CHOLECYSTECTOMY  1994   COLONOSCOPY WITH PROPOFOL N/A 11/07/2022   Procedure: COLONOSCOPY WITH PROPOFOL;  Surgeon: Wyline Mood, MD;  Location: [facility] ENDOSCOPY;  Service: Gastroenterology;  Laterality: N/A;   ESOPHAGOGASTRODUODENOSCOPY (EGD) WITH PROPOFOL N/A 11/07/2022   Procedure: ESOPHAGOGASTRODUODENOSCOPY (EGD) WITH PROPOFOL;  Surgeon: Wyline Mood, MD;  Location: [facility] ENDOSCOPY;  Service: Gastroenterology;  Laterality: N/A;   JOINT REPLACEMENT  2019   LAPAROSCOPIC GASTRIC SLEEVE RESECTION  02/02/2016   Dr Alva Garnet at [facility]   NEPHRECTOMY  1994   Renal Cell  Carcinoma   parotid gland removal  1990   also removed a tumor   POLYPECTOMY  11/07/2022   Procedure: POLYPECTOMY INTESTINAL;  Surgeon: Wyline Mood, MD;  Location: [facility] ENDOSCOPY;  Service: Gastroenterology;;   TONSILLECTOMY     TOTAL HIP ARTHROPLASTY Right 08/08/2016   Procedure: TOTAL HIP ARTHROPLASTY;  Surgeon: Deeann Saint, MD;  Location: [facility] ORS;  Service: Orthopedics;  Laterality: Right;   TUBAL LIGATION  1979   Patient Active Problem List   Diagnosis Date Noted   B12 deficiency 06/11/2022   Iron deficiency anemia 04/10/2022   Morbid obesity (HCC) 03/30/2022   Vitamin A deficiency 08/15/2021   Parkinson's disease (HCC) 08/03/2021   Acute urinary retention 08/03/2021   Meningioma (HCC) 04/05/2021   Hypothyroidism 08/15/2020   Osteopenia 06/14/2020   Status post total hip replacement, 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   Osteoarthritis of right hip 03/26/2016   H/O gastric bypass 03/26/2016   Sinus tachycardia 08/01/2015   Obesity (BMI 30-39.9) 06/08/2015   OSA (obstructive sleep apnea) 06/08/2015   GERD without esophagitis 06/08/2015   Incisional hernia 06/08/2015   Arthritis of hip 03/23/2015   Left knee pain 02/15/2015   Vitamin D deficiency 09/29/2014   Lump of right breast 09/22/2014   Prediabetes 09/22/2014   Pruritic dermatitis 09/22/2014   Obstructive sleep apnea  09/22/2014   Allergic rhinitis 09/15/2009   Hypersomnia 09/14/2009   Panic disorder 08/06/2008   Essential (primary) hypertension 07/16/2007   Hyperlipidemia 07/16/2007    ONSET DATE: Approximately 2 years ago  REFERRING DIAG: R26.89 (ICD-10-CM) - Balance problem   THERAPY DIAG:  Muscle weakness (generalized)  Repeated falls  Difficulty in walking, not elsewhere classified  Other lack of coordination  Rationale for Evaluation and Treatment: Rehabilitation  SUBJECTIVE:                                                                                                                                                                                              SUBJECTIVE STATEMENT: Patient reports she is here to address her balance and her Parkinson's in general.  Pt accompanied by: significant other  PERTINENT HISTORY: Pt is a 68y.o. female with concern for Parkinsonism due to resting tremors (R>L) with occasional whole-body jerks/myoclonus, bradykinesia, hallucinations while waking up, bilateral bradykinesia (L>R), minimal cogwheeling bilaterally, sensation of stiffness, speech changes, and imbalance (trialed Carbidopa-Levodopa, but intolerable due to side effects of severe nausea & vomiting). Pt with hx of frequent falls and bilateral LE weakness. Pt reports hx of kidney removal on R side with large abdominal incision and R hip replacement.  PAIN:  Are you having pain? No  PRECAUTIONS: Fall  RED FLAGS: None   WEIGHT BEARING RESTRICTIONS: No  FALLS: Has patient fallen in last 6 months? Yes. Number of falls 5-6 (2 in driveway, 1 on sidewalk)  Most recent fall last Thursday 05/23/2023, pt reports she became "disoriented" and had LOB, but no injuries- pt states in general she is "very easily distracted and then she will misstep causing her to have LOB"  LIVING ENVIRONMENT: Lives with: lives with their spouse [name]) Lives in: House/apartment Stairs: Yes: Internal: reports she doesn't have to go to different floors in house (4 steps inside); and External: 3-4 steps to enter with R HRs + courtyard + additional 4-5 steps onto porch with B HRs -  entrance has steep driveway Has following equipment at home: Dan Humphreys - 4 wheeled, shower bench, grabbars in bathroom, bedrail, and Ramped entry from garage (but has to go up steep driveway)  PLOF: Independent with basic ADLs, Independent with household mobility with device, Independent with household mobility without device, and Requires assistive device for independence Reports started using rollator more consistently ~2-3weeks ago but has used it on/off for a  while  PATIENT GOALS: Wants to feel comfortable moving around and be shown how to do that again  OBJECTIVE:  Note: Objective measures were completed at Evaluation unless otherwise noted.  DIAGNOSTIC FINDINGS:  CT Head without Contrast 04/03/2021: Chronic atrophic changes without acute abnormality.   MRI Brain without Contrast 04/04/2021: 1. No acute intracranial abnormality. 2. Mild age-related cerebral atrophy with chronic small vessel ischemic disease. 3. 1.1 cm meningioma overlying the left frontal convexity without associated mass effect or edema.   CT Cervical Spine without Contrast 04/03/2021: Moderate severity multilevel degenerative changes, most prominent at the levels of C4-C5, C5-C6 and C6-C7. No evidence of an acute fracture or subluxation.   COGNITION: Overall cognitive status: Within functional limits for tasks assessed   SENSATION: WFL  COORDINATION: WFL for basic functional mobility tasks  EDEMA:  None observed  MUSCLE TONE: WNL  MUSCLE LENGTH: Not tested  DTRs:  Not tested  POSTURE: rounded shoulders, forward head, and increased lumbar lordosis  LOWER EXTREMITY ROM:     Passive  All WFL for basic functional mobility tasks Right Eval Left Eval  Hip flexion    Hip extension    Hip abduction    Hip adduction    Hip internal rotation    Hip external rotation    Knee flexion    Knee extension    Ankle dorsiflexion    Ankle plantarflexion    Ankle inversion    Ankle eversion     (Blank rows = not tested)  LOWER EXTREMITY MMT:    MMT Right Eval Left Eval  Hip flexion 3+ 4  Hip extension    Hip abduction    Hip adduction    Hip internal rotation    Hip external rotation    Knee flexion 4- 4-  Knee extension 4 4  Ankle dorsiflexion 4 4-  Ankle plantarflexion 4- 4-  Ankle inversion    Ankle eversion    (Blank rows = not tested)  Manual Muscle Test Scale 0/5 = No muscle contraction can be seen or felt 1/5 = Contraction can be felt,  but there is no motion 2-/5 = Part moves through incomplete ROM w/ gravity decreased 2/5 = Part moves through complete ROM w/ gravity decreased 2+/5 = Part moves through incomplete ROM (<50%) against gravity or through complete ROM w/ gravity 3-/5 = Part moves through incomplete ROM (>50%) against gravity 3/5 = Part moves through complete ROM against gravity 3+/5 = Part moves through complete ROM against gravity/slight resistance 4-/5= Holds test position against slight to moderate pressure 4/5 = Part moves through complete ROM against gravity/moderate resistance 4+/5= Holds test position against moderate to strong pressure 5/5 = Part moves through complete ROM against gravity/full resistance  BED MOBILITY:  Sit to supine Min A and requires used of bedrail Supine to sit Min A and requires used of bedrail Rolling to Right Min A Rolling to Left Min A Relies on bedrial, difficulty with lateral scooting in the bed (or in sitting)  TRANSFERS: Assistive device utilized: Environmental consultant - 4 wheeled  Sit to stand: SBA and CGA Stand to sit: SBA and CGA Chair to chair: SBA and CGA Without AD requires more consistent CGA/light min A Floor:  not assessed  RAMP:  Assistive device utilized: Environmental consultant - 4 wheeled Ramp Comments: would benefit from assessment of this when able due to steep driveway at home  CURB:   Curb Comments: Not assessed  STAIRS: Level of Assistance: CGA Stair Negotiation Technique: Step to Pattern with Bilateral Rails ascending with R LE and descending with L LE Number of Stairs: 4  Height of Stairs: 6in  Comments: Pt reports feeling more confident when having B HR support  GAIT: Gait pattern: step through pattern, decreased step length- Right, decreased step length- Left, decreased stride length, shuffling, trendelenburg, lateral hip instability, lateral lean- Right, lateral lean- Left, wide BOS, poor foot clearance- Right, and poor foot clearance- Left --- R/L lateral trunk  lean over stance limb, decreased step lengths bilaterally, partial shuffled gait, wide BOS, increased balance instability Distance walked: ~30ft using HHA progressed to no UE support Assistive device utilized:  HHA progressed to no UE support Level of assistance: CGA and Min A Comments: Slow gait speed  FUNCTIONAL TESTS:  5 times sit to stand: need to assess Timed up and go (TUG): 24.90sec without AD, 22.25 with rollator 3 minute walk test: need to assess 10 meter walk test: 36.75 seconds = 0.59m/s without AD   Berg Balance Scale: need to assess  PATIENT SURVEYS:  ABC scale 17.5%                                                                                                                              TREATMENT DATE:   05/28/23 Eval only, but details on OMs below  Participated in Timed Up and Go (TUG): 1st trial: 24.90 seconds without AD 2nd trial: 22.25 seconds using rollator Patient demonstrates high fall risk as indicated by requiring >13.5seconds to complete the TUG.   10 Meter Walk Test: Patient instructed to walk 10 meters (32.8 ft) as quickly and as safely as possible at their normal speed x2 and at a fast speed x2. Time measured from 2 meter mark to 8 meter mark to accommodate ramp-up and ramp-down.  Results = 0.63m/s Cut off scores: <0.4 m/s = household Ambulator, 0.4-0.8 m/s = limited community Ambulator, >0.8 m/s = community Ambulator, >1.2 m/s = crossing a street, <1.0 = increased fall risk MCID 0.05 m/s (small), 0.13 m/s (moderate), 0.06 m/s (significant)  (ANPTA Core Set of Outcome Measures for Adults with Neurologic Conditions, 2018)  Activities-specific Balance Confidence Scale:  Score: 17.5% - patient 0% confident with majority of items; 50% confident household level ambulation, 40% stairs, 50% sweeping floor, 50% getting in/out of car, and 90% confident ambulating up/down ramp with rails   Increased risk of falls in community-dwelling, older adults <80%  (79.89%)  0% = no confidence - 100% = complete confidence (ANPTA Core Set of Outcome Measures for Adults with Neurologic Conditions, 2018)    PATIENT EDUCATION: Education details: PT POC, balance impairments and increased fall risk based on ABC scale Person educated: Patient and Spouse Education method: Explanation Education comprehension: verbalized understanding and needs further education  HOME EXERCISE PROGRAM: Initiate in next 1-2 visits  GOALS: Goals reviewed with patient? Yes  SHORT TERM GOALS: Target date: 07/09/2023   Patient will be independent in home exercise program to improve strength/mobility for better functional independence with ADLs. Baseline: need to initiate Goal status: INITIAL  2. Patient will improve ABC scale by 20% indicating increased confidence performing daily mobility tasks, which is associated with fall  risk.    Baseline: 05/28/2023 = 17.5%   Goal Status: INITIAL  LONG TERM GOALS: Target date: 08/20/2023   Patient (> 21 years old) will complete five times sit to stand test in < 15 seconds indicating an increased LE strength and improved balance. Baseline: need to test Goal status: INITIAL  Patient will increase Berg Balance score by > 6 points to demonstrate decreased fall risk during functional activities. Baseline: need to test Goal status: INITIAL   Patient will reduce timed up and go to <11 seconds to reduce fall risk and demonstrate improved transfer/gait ability. Baseline: 05/28/2023 = 22.25sec using rollator vs 24.90 seconds without AD Goal status: INITIAL  Patient will increase 10 meter walk test to >0.38m/s as to improve gait speed for better community ambulation and to reduce fall risk. Baseline: 05/28/2023 = 0.33m/s Goal status: INITIAL  Patient will increase 3 minute walk test distance to >393ft for progression to limited community ambulation and improved gait ability Baseline: need to test Goal status:  INITIAL   ASSESSMENT:  CLINICAL IMPRESSION: Patient is a 68 y.o. female who was seen today for physical therapy evaluation and treatment for impaired balance and gait deficits due to Parkinsonism. She demonstrates significantly increased fall risk based on gait speed, TUG, and subjectively reported balance confidence on ABC scale. She has B LE weakness impacting her gait and balance safety as noted with moderate gait deviations of wide BOS, R/L lateral trunk lean over stance limb, and decreased foot clearance bilaterally with forefoot landing. She will benefit from skilled PT to improve these deficits in order to increase QOL and ease/safety with ADLs and functional mobility.  OBJECTIVE IMPAIRMENTS: Abnormal gait, cardiopulmonary status limiting activity, decreased activity tolerance, decreased balance, decreased endurance, decreased mobility, difficulty walking, and decreased strength.   ACTIVITY LIMITATIONS: carrying, lifting, bending, standing, squatting, stairs, transfers, bed mobility, reach over head, and locomotion level  PARTICIPATION LIMITATIONS: meal prep, cleaning, laundry, shopping, and community activity  PERSONAL FACTORS: Age, Behavior pattern, Fitness, Time since onset of injury/illness/exacerbation, and 3+ comorbidities: Parkinsonism, anxiety, arthritis, depression, HTN  are also affecting patient's functional outcome.   REHAB POTENTIAL: Good  CLINICAL DECISION MAKING: Evolving/moderate complexity  EVALUATION COMPLEXITY: Moderate  PLAN:  PT FREQUENCY: 1-2x/week  PT DURATION: 12 weeks  PLANNED INTERVENTIONS: 97164- PT Re-evaluation, 97110-Therapeutic exercises, 97530- Therapeutic activity, 97112- Neuromuscular re-education, 97535- Self Care, [id number]- Manual therapy, [phone]- Gait training, Patient/Family education, Balance training, Stair training, Vestibular training, Visual/preceptual remediation/compensation, Cognitive remediation, and DME instructions  PLAN FOR NEXT  SESSION: 5XSTS, 3 min walk test, and Berg Balance Test, initiate HEP, dynamic gait training using rollator vs without, dual-task challenges (pt says becoming distracted is what causes LOB), B LE strengthening    Chisom Muntean Verdell Face, PT, DPT, NCS, CSRS Physical Therapist - [place]  [facility]  6:04 PM 05/28/23

## 2023-05-29 ENCOUNTER — Encounter: Payer: Medicare Other | Admitting: Occupational Therapy

## 2023-05-29 ENCOUNTER — Encounter: Payer: Medicare Other | Admitting: Speech Pathology

## 2023-06-03 ENCOUNTER — Encounter: Payer: Medicare Other | Admitting: Speech Pathology

## 2023-06-03 ENCOUNTER — Encounter: Payer: Medicare Other | Admitting: Occupational Therapy

## 2023-06-03 NOTE — Therapy (Unsigned)
OUTPATIENT PHYSICAL THERAPY NEURO TREATMENT   Patient Name: Brittany Benton MRN: [id number] DOB:[birth year], 68 y.o., female Today's Date: 06/04/2023   PCP: Malva Limes, MD REFERRING PROVIDER: Lonell Face, MD   END OF SESSION:    PT End of Session - 06/04/23 1319     Visit Number 2    Number of Visits 24    Date for PT Re-Evaluation 08/20/23    Authorization Type [employer] Medicare 2025    PT Start Time 1316    PT Stop Time 1357    PT Time Calculation (min) 41 min    Equipment Utilized During Treatment Gait belt    Activity Tolerance Patient tolerated treatment well;No increased pain    Behavior During Therapy [facility] for tasks assessed/performed              Past Medical History:  Diagnosis Date   Allergy 1989   Anemia    Anxiety    Arthritis    osteoarthritis -right hip   Cataract    Depression    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   GERD (gastroesophageal reflux disease)    Hernia, incisional    after renal surgery   History of chicken pox    Hyperlipidemia    Hypertension    Renal cell carcinoma (HCC)    s/p right nephrectomy 1994   Sleep apnea    Thyroid disease    Past Surgical History:  Procedure Laterality Date   APPENDECTOMY  1994   BIOPSY  11/07/2022   Procedure: BIOPSY;  Surgeon: Wyline Mood, MD;  Location: [facility] ENDOSCOPY;  Service: Gastroenterology;;   BREAST BIOPSY Right 1991   Negative   CHOLECYSTECTOMY  1994   COLONOSCOPY WITH PROPOFOL N/A 11/07/2022   Procedure: COLONOSCOPY WITH PROPOFOL;  Surgeon: Wyline Mood, MD;  Location: [facility] ENDOSCOPY;  Service: Gastroenterology;  Laterality: N/A;   ESOPHAGOGASTRODUODENOSCOPY (EGD) WITH PROPOFOL N/A 11/07/2022   Procedure: ESOPHAGOGASTRODUODENOSCOPY (EGD) WITH PROPOFOL;  Surgeon: Wyline Mood, MD;  Location: [facility] ENDOSCOPY;  Service: Gastroenterology;  Laterality: N/A;   JOINT REPLACEMENT  2019   LAPAROSCOPIC GASTRIC SLEEVE RESECTION  02/02/2016   Dr Alva Garnet at [facility]   NEPHRECTOMY   1994   Renal Cell Carcinoma   parotid gland removal  1990   also removed a tumor   POLYPECTOMY  11/07/2022   Procedure: POLYPECTOMY INTESTINAL;  Surgeon: Wyline Mood, MD;  Location: [facility] ENDOSCOPY;  Service: Gastroenterology;;   TONSILLECTOMY     TOTAL HIP ARTHROPLASTY Right 08/08/2016   Procedure: TOTAL HIP ARTHROPLASTY;  Surgeon: Deeann Saint, MD;  Location: [facility] ORS;  Service: Orthopedics;  Laterality: Right;   TUBAL LIGATION  1979   Patient Active Problem List   Diagnosis Date Noted   B12 deficiency 06/11/2022   Iron deficiency anemia 04/10/2022   Morbid obesity (HCC) 03/30/2022   Vitamin A deficiency 08/15/2021   Parkinson's disease (HCC) 08/03/2021   Acute urinary retention 08/03/2021   Meningioma (HCC) 04/05/2021   Hypothyroidism 08/15/2020   Osteopenia 06/14/2020   Status post total hip replacement, 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   Osteoarthritis of right hip 03/26/2016   H/O gastric bypass 03/26/2016   Sinus tachycardia 08/01/2015   Obesity (BMI 30-39.9) 06/08/2015   OSA (obstructive sleep apnea) 06/08/2015   GERD without esophagitis 06/08/2015   Incisional hernia 06/08/2015   Arthritis of hip 03/23/2015   Left knee pain 02/15/2015   Vitamin D deficiency 09/29/2014   Lump of right breast 09/22/2014   Prediabetes 09/22/2014   Pruritic dermatitis 09/22/2014  Obstructive sleep apnea 09/22/2014   Allergic rhinitis 09/15/2009   Hypersomnia 09/14/2009   Panic disorder 08/06/2008   Essential (primary) hypertension 07/16/2007   Hyperlipidemia 07/16/2007    ONSET DATE: Approximately 2 years ago  REFERRING DIAG: R26.89 (ICD-10-CM) - Balance problem   THERAPY DIAG:  Muscle weakness (generalized)  Repeated falls  Difficulty in walking, not elsewhere classified  Other lack of coordination  Rationale for Evaluation and Treatment: Rehabilitation  SUBJECTIVE:                                                                                                                                                                                              SUBJECTIVE STATEMENT:  Pt reports she has been "wonderful' and not had any falls. Denies any medical changes. Patient eager to participate in therapy session.  Pt accompanied by: significant other  PERTINENT HISTORY: Pt is a 67y.o. female with concern for Parkinsonism due to resting tremors (R>L) with occasional whole-body jerks/myoclonus, bradykinesia, hallucinations while waking up, bilateral bradykinesia (L>R), minimal cogwheeling bilaterally, sensation of stiffness, speech changes, and imbalance (trialed Carbidopa-Levodopa, but intolerable due to side effects of severe nausea & vomiting). Pt with hx of frequent falls and bilateral LE weakness. Pt reports hx of kidney removal on R side with large abdominal incision and R hip replacement.  PAIN:  Are you having pain? No  PRECAUTIONS: Fall  RED FLAGS: None   WEIGHT BEARING RESTRICTIONS: No  FALLS: Has patient fallen in last 6 months? Yes. Number of falls 5-6 (2 in driveway, 1 on sidewalk)  Most recent fall last Thursday 05/23/2023, pt reports she became "disoriented" and had LOB, but no injuries- pt states in general she is "very easily distracted and then she will misstep causing her to have LOB"  LIVING ENVIRONMENT: Lives with: lives with their spouse [name]) Lives in: House/apartment Stairs: Yes: Internal: reports she doesn't have to go to different floors in house (4 steps inside); and External: 3-4 steps to enter with R HRs + courtyard + additional 4-5 steps onto porch with B HRs -  entrance has steep driveway Has following equipment at home: Dan Humphreys - 4 wheeled, shower bench, grabbars in bathroom, bedrail, and Ramped entry from garage (but has to go up steep driveway)  PLOF: Independent with basic ADLs, Independent with household mobility with device, Independent with household mobility without device, and Requires assistive device for independence Reports started  using rollator more consistently ~2-3weeks ago but has used it on/off for a while  PATIENT GOALS: Wants to feel comfortable moving around and be shown how to do that again  OBJECTIVE:  Note: Objective  measures were completed at Evaluation unless otherwise noted.  DIAGNOSTIC FINDINGS:   CT Head without Contrast 04/03/2021: Chronic atrophic changes without acute abnormality.   MRI Brain without Contrast 04/04/2021: 1. No acute intracranial abnormality. 2. Mild age-related cerebral atrophy with chronic small vessel ischemic disease. 3. 1.1 cm meningioma overlying the left frontal convexity without associated mass effect or edema.   CT Cervical Spine without Contrast 04/03/2021: Moderate severity multilevel degenerative changes, most prominent at the levels of C4-C5, C5-C6 and C6-C7. No evidence of an acute fracture or subluxation.   COGNITION: Overall cognitive status: Within functional limits for tasks assessed   SENSATION: WFL  COORDINATION: WFL for basic functional mobility tasks  EDEMA:  None observed  MUSCLE TONE: WNL  MUSCLE LENGTH: Not tested  DTRs:  Not tested  POSTURE: rounded shoulders, forward head, and increased lumbar lordosis  LOWER EXTREMITY ROM:     Passive  All WFL for basic functional mobility tasks Right Eval Left Eval  Hip flexion    Hip extension    Hip abduction    Hip adduction    Hip internal rotation    Hip external rotation    Knee flexion    Knee extension    Ankle dorsiflexion    Ankle plantarflexion    Ankle inversion    Ankle eversion     (Blank rows = not tested)  LOWER EXTREMITY MMT:    MMT Right Eval Left Eval  Hip flexion 3+ 4  Hip extension    Hip abduction    Hip adduction    Hip internal rotation    Hip external rotation    Knee flexion 4- 4-  Knee extension 4 4  Ankle dorsiflexion 4 4-  Ankle plantarflexion 4- 4-  Ankle inversion    Ankle eversion    (Blank rows = not tested)  Manual Muscle Test Scale 0/5  = No muscle contraction can be seen or felt 1/5 = Contraction can be felt, but there is no motion 2-/5 = Part moves through incomplete ROM w/ gravity decreased 2/5 = Part moves through complete ROM w/ gravity decreased 2+/5 = Part moves through incomplete ROM (<50%) against gravity or through complete ROM w/ gravity 3-/5 = Part moves through incomplete ROM (>50%) against gravity 3/5 = Part moves through complete ROM against gravity 3+/5 = Part moves through complete ROM against gravity/slight resistance 4-/5= Holds test position against slight to moderate pressure 4/5 = Part moves through complete ROM against gravity/moderate resistance 4+/5= Holds test position against moderate to strong pressure 5/5 = Part moves through complete ROM against gravity/full resistance  BED MOBILITY:  Sit to supine Min A and requires used of bedrail Supine to sit Min A and requires used of bedrail Rolling to Right Min A Rolling to Left Min A Relies on bedrial, difficulty with lateral scooting in the bed (or in sitting)  TRANSFERS: Assistive device utilized: Environmental consultant - 4 wheeled  Sit to stand: SBA and CGA Stand to sit: SBA and CGA Chair to chair: SBA and CGA Without AD requires more consistent CGA/light min A Floor:  not assessed  RAMP:  Assistive device utilized: Environmental consultant - 4 wheeled Ramp Comments: would benefit from assessment of this when able due to steep driveway at home  CURB:   Curb Comments: Not assessed  STAIRS: Level of Assistance: CGA Stair Negotiation Technique: Step to Pattern with Bilateral Rails ascending with R LE and descending with L LE Number of Stairs: 4  Height of Stairs: 6in  Comments: Pt reports feeling more confident when having B HR support  GAIT: Gait pattern: step through pattern, decreased step length- Right, decreased step length- Left, decreased stride length, shuffling, trendelenburg, lateral hip instability, lateral lean- Right, lateral lean- Left, wide BOS, poor  foot clearance- Right, and poor foot clearance- Left --- R/L lateral trunk lean over stance limb, decreased step lengths bilaterally, partial shuffled gait, wide BOS, increased balance instability Distance walked: ~49ft using HHA progressed to no UE support Assistive device utilized:  HHA progressed to no UE support Level of assistance: CGA and Min A Comments: Slow gait speed  FUNCTIONAL TESTS:  5 times sit to stand: on 06/04/2023: 59.25 seconds Timed up and go (TUG): 24.90sec without AD, 22.25 with rollator 3 minute walk test: on 06/04/2023: 221ft = 80.75meters 10 meter walk test: 36.75 seconds = 0.63m/s without AD; 06/04/23 0.16m/s average gait speed during 3 minute walk test using rollator  Berg Balance Scale: on 06/04/2023 = 31/56  PATIENT SURVEYS:  ABC scale 17.5%  Details: patient 0% confident with majority of items; 50% confident household level ambulation, 40% stairs, 50% sweeping floor, 50% getting in/out of car, and 90% confident ambulating up/down ramp with rails                                                                                                                               TREATMENT DATE:   06/04/23   Pt with noticeable increased spirits today during session and highly motivated to participate.  Five times Sit to Stand Test (FTSS) "Stand up and sit down as quickly as possible 5 times, keeping your arms folded across your chest."    TIME: 59.25 seconds - pt able to do it with arms across chest but does require a few attempts to come to full standing *pt surprised and excited for her ability to achieve this without relying on UE support, but does demonstrate noticeable weakness in B LEs and slow to rise  Times > 13.6 seconds is associated with increased disability and morbidity (Guralnik, 2000) Times > 15 seconds is predictive of recurrent falls in healthy individuals aged 55 and older (Buatois, et al., 2008) Normal performance values in community dwelling  individuals aged 103 and older (Bohannon, 2006): 60-69 years: 11.4 seconds 70-79 years: 12.6 seconds 80-89 years: 14.8 seconds  MCID: >= 2.3 seconds for Vestibular Disorders (Meretta, 2006)   Participated in modified 3 Minute walk Test using rollator due to pt unable to tolerate ambulating for a full 6 minutes without seated rest break: achieves 250ft = 80.50meters = 0.8m/s average gait speed   Patient participated in [employer] and demonstrates increased fall risk as noted by score of   31/56.  (<36= high risk for falls, close to 100%; 37-45 significant >80%; 46-51 moderate >50%; 52-55 lower >25%).   [facility] PT Assessment - 06/04/23 0001       Berg Balance Test   Sit to Stand Able to  stand  independently using hands    Standing Unsupported Able to stand safely 2 minutes    Sitting with Back Unsupported but Feet Supported on Floor or Stool Able to sit safely and securely 2 minutes    Stand to Sit Sits safely with minimal use of hands    Transfers Able to transfer safely, minor use of hands    Standing Unsupported with Eyes Closed Able to stand 10 seconds with supervision    Standing Unsupported with Feet Together Needs help to attain position but able to stand for 30 seconds with feet together   requires HHA to get in positon,  then can hold for 1 minute   From Standing, Reach Forward with Outstretched Arm Can reach forward >5 cm safely (2")    From Standing Position, Pick up Object from Floor Able to pick up shoe, needs supervision    From Standing Position, Turn to Look Behind Over each Shoulder Turn sideways only but maintains balance    Turn 360 Degrees Needs close supervision or verbal cueing    Standing Unsupported, Alternately Place Feet on Step/Stool Needs assistance to keep from falling or unable to try    Standing Unsupported, One Foot in [employer] balance while stepping or standing   on 1st attempt unable to hold 15seconds, improved on 2nd attempt   Standing on One Leg  Unable to try or needs assist to prevent fall    Total Score 31    Berg comment: High Fall Risk            Participated in 1 set of each of the following exercises as prescribed on pt's HEP with therapist providing cuing for safe set-up at home, proper technique during exercises, and importance of performing them daily with suggestions on how to integrate them into her daily routine.   PATIENT EDUCATION:  Education details: PT POC, balance impairments and increased fall risk based on outcome measures, HEP Person educated: Patient and Spouse Education method: Explanation Education comprehension: verbalized understanding and needs further education  HOME EXERCISE PROGRAM:  Access Code: [email or web address] URL: [email or web address] Date: 06/04/2023 Prepared by: Casimiro Needle  Exercises - Walking with Rollator - 1 x daily - 7 x weekly - 3-5 sets - 3 minutes hold - Sit to Stand with Hands on Knees  - 1 x daily - 7 x weekly - 2 sets - 10 reps - Standing March with Counter Support  - 1 x daily - 7 x weekly - 2 sets - 10 reps  GOALS: Goals reviewed with patient? Yes  SHORT TERM GOALS: Target date: 07/09/2023   Patient will be independent in home exercise program to improve strength/mobility for better functional independence with ADLs. Baseline: provided on 06/04/2023 Goal status: INITIAL  2. Patient will improve ABC scale by 20% indicating increased confidence performing daily mobility tasks, which is associated with fall risk.    Baseline: 05/28/2023 = 17.5%   Goal Status: INITIAL  LONG TERM GOALS: Target date: 08/20/2023   Patient (> 48 years old) will complete five times sit to stand test in < 15 seconds indicating an increased LE strength and improved balance. Baseline: 06/04/2023: 59.25 seconds Goal status: INITIAL  Patient will increase Berg Balance score by > 6 points to demonstrate decreased fall risk during functional activities. Baseline: 06/04/2023: 31/56 Goal  status: INITIAL   Patient will reduce timed up and go to <11 seconds to reduce fall risk and demonstrate improved transfer/gait ability. Baseline: 05/28/2023 = 22.25sec using rollator  vs 24.90 seconds without AD Goal status: INITIAL  Patient will increase 10 meter walk test to >0.55m/s as to improve gait speed for better community ambulation and to reduce fall risk. Baseline: 05/28/2023 = 0.16m/s Goal status: INITIAL  Patient will increase 3 minute walk test distance to >347ft for progression to limited community ambulation and improved gait ability Baseline: 06/04/2023 = 275ft (80.11meters) Goal status: INITIAL   ASSESSMENT:  CLINICAL IMPRESSION:  Patient is a 68 y.o. female who was seen today for physical therapy treatment for impaired balance and gait deficits due to Parkinsonism. She demonstrates significantly increased fall risk based on gait speed, 3 minute walk test, and results of Berg Balance Test. She has B LE weakness impacting her gait and balance safety as noted during functional strength assessment of 5x STS test. Initiated HEP today with focus on B LE strengthening, gait endurance, and dynamic standing balance task at counter. She will benefit from skilled PT to improve these deficits in order to increase QOL and ease/safety with ADLs and functional mobility.  OBJECTIVE IMPAIRMENTS: Abnormal gait, cardiopulmonary status limiting activity, decreased activity tolerance, decreased balance, decreased endurance, decreased mobility, difficulty walking, and decreased strength.   ACTIVITY LIMITATIONS: carrying, lifting, bending, standing, squatting, stairs, transfers, bed mobility, reach over head, and locomotion level  PARTICIPATION LIMITATIONS: meal prep, cleaning, laundry, shopping, and community activity  PERSONAL FACTORS: Age, Behavior pattern, Fitness, Time since onset of injury/illness/exacerbation, and 3+ comorbidities: Parkinsonism, anxiety, arthritis, depression, HTN  are also  affecting patient's functional outcome.   REHAB POTENTIAL: Good  CLINICAL DECISION MAKING: Evolving/moderate complexity  EVALUATION COMPLEXITY: Moderate  PLAN:  PT FREQUENCY: 1-2x/week  PT DURATION: 12 weeks  PLANNED INTERVENTIONS: 97164- PT Re-evaluation, 97110-Therapeutic exercises, 97530- Therapeutic activity, 97112- Neuromuscular re-education, 97535- Self Care, 40102- Manual therapy, [phone]- Gait training, Patient/Family education, Balance training, Stair training, Vestibular training, Visual/preceptual remediation/compensation, Cognitive remediation, and DME instructions  PLAN FOR NEXT SESSION:    Review HEP, dynamic gait training using rollator vs without, dual-task challenges (pt says becoming distracted is what causes LOB), B LE strengthening    Lakesha Levinson M Makynleigh Breslin, PT, DPT, NCS, CSRS Physical Therapist - [place]  [facility]  2:28 PM 06/04/23

## 2023-06-04 ENCOUNTER — Ambulatory Visit: Payer: Medicare Other | Admitting: Physical Therapy

## 2023-06-04 ENCOUNTER — Ambulatory Visit: Payer: Medicare Other | Admitting: Speech Pathology

## 2023-06-04 DIAGNOSIS — R262 Difficulty in walking, not elsewhere classified: Secondary | ICD-10-CM

## 2023-06-04 DIAGNOSIS — R471 Dysarthria and anarthria: Secondary | ICD-10-CM | POA: Diagnosis not present

## 2023-06-04 DIAGNOSIS — M6281 Muscle weakness (generalized): Secondary | ICD-10-CM | POA: Diagnosis not present

## 2023-06-04 DIAGNOSIS — R296 Repeated falls: Secondary | ICD-10-CM

## 2023-06-04 DIAGNOSIS — R278 Other lack of coordination: Secondary | ICD-10-CM

## 2023-06-04 DIAGNOSIS — R41841 Cognitive communication deficit: Secondary | ICD-10-CM | POA: Diagnosis not present

## 2023-06-05 ENCOUNTER — Encounter: Payer: Medicare Other | Admitting: Occupational Therapy

## 2023-06-05 ENCOUNTER — Encounter: Payer: Medicare Other | Admitting: Speech Pathology

## 2023-06-07 ENCOUNTER — Encounter: Payer: Medicare Other | Admitting: Speech Pathology

## 2023-06-07 ENCOUNTER — Ambulatory Visit: Payer: Medicare Other | Admitting: Physical Therapy

## 2023-06-07 DIAGNOSIS — R471 Dysarthria and anarthria: Secondary | ICD-10-CM | POA: Diagnosis not present

## 2023-06-07 DIAGNOSIS — R278 Other lack of coordination: Secondary | ICD-10-CM | POA: Diagnosis not present

## 2023-06-07 DIAGNOSIS — R41841 Cognitive communication deficit: Secondary | ICD-10-CM | POA: Diagnosis not present

## 2023-06-07 DIAGNOSIS — R296 Repeated falls: Secondary | ICD-10-CM | POA: Diagnosis not present

## 2023-06-07 DIAGNOSIS — R262 Difficulty in walking, not elsewhere classified: Secondary | ICD-10-CM | POA: Diagnosis not present

## 2023-06-07 DIAGNOSIS — M6281 Muscle weakness (generalized): Secondary | ICD-10-CM

## 2023-06-07 NOTE — Therapy (Signed)
OUTPATIENT PHYSICAL THERAPY NEURO TREATMENT   Patient Name: Brittany Benton MRN: [id number] DOB:03-07-1956, 68 y.o., female Today's Date: 06/07/2023   PCP: Malva Limes, MD REFERRING PROVIDER: Lonell Face, MD   END OF SESSION:    PT End of Session - 06/07/23 1016     Visit Number 3    Number of Visits 24    Date for PT Re-Evaluation 08/20/23    Authorization Type [employer] Medicare 2025    PT Start Time 1016    PT Stop Time 1111    PT Time Calculation (min) 55 min    Equipment Utilized During Treatment Gait belt    Activity Tolerance Patient tolerated treatment well;No increased pain    Behavior During Therapy [facility] for tasks assessed/performed               Past Medical History:  Diagnosis Date   Allergy 1989   Anemia    Anxiety    Arthritis    osteoarthritis -right hip   Cataract    Depression    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   GERD (gastroesophageal reflux disease)    Hernia, incisional    after renal surgery   History of chicken pox    Hyperlipidemia    Hypertension    Renal cell carcinoma (HCC)    s/p right nephrectomy 1994   Sleep apnea    Thyroid disease    Past Surgical History:  Procedure Laterality Date   APPENDECTOMY  1994   BIOPSY  11/07/2022   Procedure: BIOPSY;  Surgeon: Wyline Mood, MD;  Location: [facility] ENDOSCOPY;  Service: Gastroenterology;;   BREAST BIOPSY Right 1991   Negative   CHOLECYSTECTOMY  1994   COLONOSCOPY WITH PROPOFOL N/A 11/07/2022   Procedure: COLONOSCOPY WITH PROPOFOL;  Surgeon: Wyline Mood, MD;  Location: [facility] ENDOSCOPY;  Service: Gastroenterology;  Laterality: N/A;   ESOPHAGOGASTRODUODENOSCOPY (EGD) WITH PROPOFOL N/A 11/07/2022   Procedure: ESOPHAGOGASTRODUODENOSCOPY (EGD) WITH PROPOFOL;  Surgeon: Wyline Mood, MD;  Location: [facility] ENDOSCOPY;  Service: Gastroenterology;  Laterality: N/A;   JOINT REPLACEMENT  2019   LAPAROSCOPIC GASTRIC SLEEVE RESECTION  02/02/2016   Dr Alva Garnet at [facility]   NEPHRECTOMY   1994   Renal Cell Carcinoma   parotid gland removal  1990   also removed a tumor   POLYPECTOMY  11/07/2022   Procedure: POLYPECTOMY INTESTINAL;  Surgeon: Wyline Mood, MD;  Location: [facility] ENDOSCOPY;  Service: Gastroenterology;;   TONSILLECTOMY     TOTAL HIP ARTHROPLASTY Right 08/08/2016   Procedure: TOTAL HIP ARTHROPLASTY;  Surgeon: Deeann Saint, MD;  Location: [facility] ORS;  Service: Orthopedics;  Laterality: Right;   TUBAL LIGATION  1979   Patient Active Problem List   Diagnosis Date Noted   B12 deficiency 06/11/2022   Iron deficiency anemia 04/10/2022   Morbid obesity (HCC) 03/30/2022   Vitamin A deficiency 08/15/2021   Parkinson's disease (HCC) 08/03/2021   Acute urinary retention 08/03/2021   Meningioma (HCC) 04/05/2021   Hypothyroidism 08/15/2020   Osteopenia 06/14/2020   Status post total hip replacement, 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   Osteoarthritis of right hip 03/26/2016   H/O gastric bypass 03/26/2016   Sinus tachycardia 08/01/2015   Obesity (BMI 30-39.9) 06/08/2015   OSA (obstructive sleep apnea) 06/08/2015   GERD without esophagitis 06/08/2015   Incisional hernia 06/08/2015   Arthritis of hip 03/23/2015   Left knee pain 02/15/2015   Vitamin D deficiency 09/29/2014   Lump of right breast 09/22/2014   Prediabetes 09/22/2014   Pruritic dermatitis 09/22/2014  Obstructive sleep apnea 09/22/2014   Allergic rhinitis 09/15/2009   Hypersomnia 09/14/2009   Panic disorder 08/06/2008   Essential (primary) hypertension 07/16/2007   Hyperlipidemia 07/16/2007    ONSET DATE: Approximately 2 years ago  REFERRING DIAG: R26.89 (ICD-10-CM) - Balance problem   THERAPY DIAG:  Muscle weakness (generalized)  Repeated falls  Difficulty in walking, not elsewhere classified  Other lack of coordination  Rationale for Evaluation and Treatment: Rehabilitation  SUBJECTIVE:                                                                                                                                                                                              SUBJECTIVE STATEMENT:  Pt reports doing well today. Pt reports she did her exercises, but really had a challenge with the sit<>stand exercise because she did not have the right chair, reporting it was too deep. Denies pain. Denies stumbles/falls since last visit. Denies any changes in her medical status.   Pt accompanied by: significant other  PERTINENT HISTORY: Pt is a 68y.o. female with concern for Parkinsonism due to resting tremors (R>L) with occasional whole-body jerks/myoclonus, bradykinesia, hallucinations while waking up, bilateral bradykinesia (L>R), minimal cogwheeling bilaterally, sensation of stiffness, speech changes, and imbalance (trialed Carbidopa-Levodopa, but intolerable due to side effects of severe nausea & vomiting). Pt with hx of frequent falls and bilateral LE weakness. Pt reports hx of kidney removal on R side with large abdominal incision and R hip replacement.  PAIN:  Are you having pain? No  PRECAUTIONS: Fall  RED FLAGS: None   WEIGHT BEARING RESTRICTIONS: No  FALLS: Has patient fallen in last 6 months? Yes. Number of falls 5-6 (2 in driveway, 1 on sidewalk)  Most recent fall last Thursday 05/23/2023, pt reports she became "disoriented" and had LOB, but no injuries- pt states in general she is "very easily distracted and then she will misstep causing her to have LOB"  LIVING ENVIRONMENT: Lives with: lives with their spouse [name]) Lives in: House/apartment Stairs: Yes: Internal: reports she doesn't have to go to different floors in house (4 steps inside); and External: 3-4 steps to enter with R HRs + courtyard + additional 4-5 steps onto porch with B HRs -  entrance has steep driveway Has following equipment at home: Dan Humphreys - 4 wheeled, shower bench, grabbars in bathroom, bedrail, and Ramped entry from garage (but has to go up steep driveway)  PLOF: Independent with basic ADLs, Independent with  household mobility with device, Independent with household mobility without device, and Requires assistive device for independence Reports started using rollator more consistently ~2-3weeks ago but has used it  on/off for a while  PATIENT GOALS: Wants to feel comfortable moving around and be shown how to do that again  OBJECTIVE:  Note: Objective measures were completed at Evaluation unless otherwise noted.  DIAGNOSTIC FINDINGS:   CT Head without Contrast 04/03/2021: Chronic atrophic changes without acute abnormality.   MRI Brain without Contrast 04/04/2021: 1. No acute intracranial abnormality. 2. Mild age-related cerebral atrophy with chronic small vessel ischemic disease. 3. 1.1 cm meningioma overlying the left frontal convexity without associated mass effect or edema.   CT Cervical Spine without Contrast 04/03/2021: Moderate severity multilevel degenerative changes, most prominent at the levels of C4-C5, C5-C6 and C6-C7. No evidence of an acute fracture or subluxation.   COGNITION: Overall cognitive status: Within functional limits for tasks assessed   SENSATION: WFL  COORDINATION: WFL for basic functional mobility tasks  EDEMA:  None observed  MUSCLE TONE: WNL  MUSCLE LENGTH: Not tested  DTRs:  Not tested  POSTURE: rounded shoulders, forward head, and increased lumbar lordosis  LOWER EXTREMITY ROM:     Passive  All WFL for basic functional mobility tasks Right Eval Left Eval  Hip flexion    Hip extension    Hip abduction    Hip adduction    Hip internal rotation    Hip external rotation    Knee flexion    Knee extension    Ankle dorsiflexion    Ankle plantarflexion    Ankle inversion    Ankle eversion     (Blank rows = not tested)  LOWER EXTREMITY MMT:    MMT Right Eval Left Eval  Hip flexion 3+ 4  Hip extension    Hip abduction    Hip adduction    Hip internal rotation    Hip external rotation    Knee flexion 4- 4-  Knee extension 4 4  Ankle  dorsiflexion 4 4-  Ankle plantarflexion 4- 4-  Ankle inversion    Ankle eversion    (Blank rows = not tested)  Manual Muscle Test Scale 0/5 = No muscle contraction can be seen or felt 1/5 = Contraction can be felt, but there is no motion 2-/5 = Part moves through incomplete ROM w/ gravity decreased 2/5 = Part moves through complete ROM w/ gravity decreased 2+/5 = Part moves through incomplete ROM (<50%) against gravity or through complete ROM w/ gravity 3-/5 = Part moves through incomplete ROM (>50%) against gravity 3/5 = Part moves through complete ROM against gravity 3+/5 = Part moves through complete ROM against gravity/slight resistance 4-/5= Holds test position against slight to moderate pressure 4/5 = Part moves through complete ROM against gravity/moderate resistance 4+/5= Holds test position against moderate to strong pressure 5/5 = Part moves through complete ROM against gravity/full resistance  BED MOBILITY:  Sit to supine Min A and requires used of bedrail Supine to sit Min A and requires used of bedrail Rolling to Right Min A Rolling to Left Min A Relies on bedrial, difficulty with lateral scooting in the bed (or in sitting)  TRANSFERS: Assistive device utilized: Environmental consultant - 4 wheeled  Sit to stand: SBA and CGA Stand to sit: SBA and CGA Chair to chair: SBA and CGA Without AD requires more consistent CGA/light min A Floor:  not assessed  RAMP:  Assistive device utilized: Walker - 4 wheeled Ramp Comments: would benefit from assessment of this when able due to steep driveway at home  CURB:   Curb Comments: Not assessed  STAIRS: Level of Assistance: CGA Stair  Negotiation Technique: Step to Pattern with Bilateral Rails ascending with R LE and descending with L LE Number of Stairs: 4  Height of Stairs: 6in  Comments: Pt reports feeling more confident when having B HR support  GAIT: Gait pattern: step through pattern, decreased step length- Right, decreased step  length- Left, decreased stride length, shuffling, trendelenburg, lateral hip instability, lateral lean- Right, lateral lean- Left, wide BOS, poor foot clearance- Right, and poor foot clearance- Left --- R/L lateral trunk lean over stance limb, decreased step lengths bilaterally, partial shuffled gait, wide BOS, increased balance instability Distance walked: ~23ft using HHA progressed to no UE support Assistive device utilized:  HHA progressed to no UE support Level of assistance: CGA and Min A Comments: Slow gait speed  FUNCTIONAL TESTS:  5 times sit to stand: on 06/04/2023: 59.25 seconds Timed up and go (TUG): 24.90sec without AD, 22.25 with rollator 3 minute walk test: on 06/04/2023: 257ft using rollator = 80.26meters 10 meter walk test: 36.75 seconds = 0.28m/s without AD; 06/04/23 0.65m/s average gait speed during 3 minute walk test using rollator  Berg Balance Scale: on 06/04/2023 = 31/56 Would benefit from Modified CTSIB & FGA  PATIENT SURVEYS:  ABC scale 17.5%  Details: patient 0% confident with majority of items; 50% confident household level ambulation, 40% stairs, 50% sweeping floor, 50% getting in/out of car, and 90% confident ambulating up/down ramp with rails    Vestibular Assessment  - performed on 06/07/2023  OCULOMOTOR EXAM:  Ocular Alignment: normal  Ocular ROM: No Limitations  Spontaneous Nystagmus: absent  Gaze-Induced Nystagmus: absent and continues to have difficulty keeping eyes on target, especially towards L  Smooth Pursuits:  difficulty keeping eyes on target  Saccades: hypometric/undershoots, extra eye movements, and difficulty finding target on L side  Convergence/Divergence: not assessed Cover/Cross Cover: WNL  VESTIBULAR - OCULAR REFLEX:   VOR Cancellation: Unable to Maintain Gaze, inconsistent ability to keep eyes on target  Head-Impulse Test: HIT Right: negative HIT Left: negative -- had a difficult time keeping eyes on target in both directions  Dynamic  Visual Acuity:  would benefit from future testing    POSITIONAL TESTING: Right Dix-Hallpike: upbeating, right nystagmus Left Dix-Hallpike: benefit from testing at next visit Right Roll Test: no nystagmus Left Roll Test: no nystagmus Left Sidelying: no nystagmus  MOTION SENSITIVITY:  May benefit from testing in future visits                                                                                                   TREATMENT DATE:   06/07/23    Unless otherwise stated, CGA was provided and gait belt donned in order to ensure pt safety  Gait training 110ft, no AD, with CGA/min assist for steadying/balance with focus on increased gait speed. Pt demonstrating the following gait deviations with therapist providing the described cuing and facilitation for improvement:  - wide BOS - high guard UB posturing - short step lengths bilaterally - cuing for increased gait speed, increased arm swing, and longer step lengths - added dual-task challenge of visually scanning to identify targets on  wall with pt having to significantly decrease gait speed to maintain balance  When transitioning from sit>supine pt with onset of dizziness and when returning to sitting EOM dizziness occurred again lasting <10seconds. Pt reports this started a few weeks ago after she fell - she was bending forward to get the laundry and she fell backwards.  Performed the above vestibular assessment. Pt with positive R Gilberto Better and treated with Brewing technologist (CRM). Reassessed 2nd time with decreased in nystagmus and symptoms so repeated CRM a 2nd time. Patient very appreciative of this treatment stating she felt like this was something she was just going to have to "live with." Therapist provided ANPT educational hand-out on BPPV and what to do after repositioning maneuver.  Added Vestibular assessment and treatment to patient's POC and MD made aware.   PATIENT EDUCATION:  Education details: PT  POC, balance impairments and increased fall risk based on outcome measures, HEP Person educated: Patient and Spouse Education method: Explanation Education comprehension: verbalized understanding and needs further education  HOME EXERCISE PROGRAM:  Access Code: [email or web address] URL: [email or web address] Date: 06/04/2023 Prepared by: Casimiro Needle  Exercises - Walking with Rollator - 1 x daily - 7 x weekly - 3-5 sets - 3 minutes hold - Sit to Stand with Hands on Knees  - 1 x daily - 7 x weekly - 2 sets - 10 reps - Standing March with Counter Support  - 1 x daily - 7 x weekly - 2 sets - 10 reps  GOALS: Goals reviewed with patient? Yes  SHORT TERM GOALS: Target date: 07/09/2023   Patient will be independent in home exercise program to improve strength/mobility for better functional independence with ADLs. Baseline: provided on 06/04/2023 Goal status: INITIAL  2. Patient will improve ABC scale by 20% indicating increased confidence performing daily mobility tasks, which is associated with fall risk.    Baseline: 05/28/2023 = 17.5%   Goal Status: INITIAL  LONG TERM GOALS: Target date: 08/20/2023   Patient (> 93 years old) will complete five times sit to stand test in < 15 seconds indicating an increased LE strength and improved balance. Baseline: 06/04/2023: 59.25 seconds Goal status: INITIAL  Patient will increase Berg Balance score by > 6 points to demonstrate decreased fall risk during functional activities. Baseline: 06/04/2023: 31/56 Goal status: INITIAL   Patient will reduce timed up and go to <11 seconds to reduce fall risk and demonstrate improved transfer/gait ability. Baseline: 05/28/2023 = 22.25sec using rollator vs 24.90 seconds without AD Goal status: INITIAL  Patient will increase 10 meter walk test to >0.63m/s as to improve gait speed for better community ambulation and to reduce fall risk. Baseline: 05/28/2023 = 0.33m/s Goal status: INITIAL  Patient will  increase 3 minute walk test distance to >376ft for progression to limited community ambulation and improved gait ability Baseline: 06/04/2023 = 267ft (80.17meters) Goal status: INITIAL   ASSESSMENT:  CLINICAL IMPRESSION:  Patient is a 68 y.o. female who was seen today for physical therapy treatment for impaired balance and gait deficits due to Parkinsonism. Patient found to have positive R Dix-Hallpike testing and therefore canalith repositioning maneuver was performed with pt having improvement in symptoms and nystagmus on repeat testing. Patient greatly appreciative of this treatment today. Patient will benefit from continued follow-up vestibular testing with progression to dynamic balance and gait tasks with dual-task gaze stabilizations activities. In addition to that, pt will benefit from functional B LE strengthening exercises as well. Ms. Mcglaun will benefit from continued skilled  PT to improve these deficits in order to increase QOL and ease/safety with ADLs and functional mobility.  OBJECTIVE IMPAIRMENTS: Abnormal gait, cardiopulmonary status limiting activity, decreased activity tolerance, decreased balance, decreased endurance, decreased mobility, difficulty walking, and decreased strength.   ACTIVITY LIMITATIONS: carrying, lifting, bending, standing, squatting, stairs, transfers, bed mobility, reach over head, and locomotion level  PARTICIPATION LIMITATIONS: meal prep, cleaning, laundry, shopping, and community activity  PERSONAL FACTORS: Age, Behavior pattern, Fitness, Time since onset of injury/illness/exacerbation, and 3+ comorbidities: Parkinsonism, anxiety, arthritis, depression, HTN  are also affecting patient's functional outcome.   REHAB POTENTIAL: Good  CLINICAL DECISION MAKING: Evolving/moderate complexity  EVALUATION COMPLEXITY: Moderate  PLAN:  PT FREQUENCY: 1-2x/week  PT DURATION: 12 weeks  PLANNED INTERVENTIONS: 97164- PT Re-evaluation, 97110-Therapeutic  exercises, 97530- Therapeutic activity, 97112- Neuromuscular re-education, 97535- Self Care, [id number]- Manual therapy, [phone]- Gait training, [phone]- Canalith repositioning, Patient/Family education, Balance training, Stair training, Vestibular training, Visual/preceptual remediation/compensation, Cognitive remediation, and DME instructions   PLAN FOR NEXT SESSION:   - follow-up on BPPV testing (need to perform L Gilberto Better and repeat on R side) - dynamic balance and dual-task challenges with gaze stabilization dual-task challenges - B LE strengthening    Deny Chevez Verdell Face, PT, DPT, NCS, CSRS Physical Therapist - [facility]  [facility]  11:32 AM 06/07/23

## 2023-06-10 ENCOUNTER — Encounter: Payer: Medicare Other | Admitting: Speech Pathology

## 2023-06-10 ENCOUNTER — Encounter: Payer: Medicare Other | Admitting: Occupational Therapy

## 2023-06-10 ENCOUNTER — Inpatient Hospital Stay: Payer: Medicare Other | Attending: Internal Medicine

## 2023-06-10 DIAGNOSIS — E538 Deficiency of other specified B group vitamins: Secondary | ICD-10-CM | POA: Diagnosis not present

## 2023-06-10 DIAGNOSIS — D509 Iron deficiency anemia, unspecified: Secondary | ICD-10-CM

## 2023-06-10 MED ORDER — CYANOCOBALAMIN 1000 MCG/ML IJ SOLN
1000.0000 ug | Freq: Once | INTRAMUSCULAR | Status: AC
  Administered 2023-06-10: 1000 ug via INTRAMUSCULAR
  Filled 2023-06-10: qty 1

## 2023-06-11 ENCOUNTER — Ambulatory Visit: Payer: Medicare Other | Admitting: Physical Therapy

## 2023-06-11 ENCOUNTER — Encounter: Payer: Medicare Other | Admitting: Speech Pathology

## 2023-06-11 DIAGNOSIS — R262 Difficulty in walking, not elsewhere classified: Secondary | ICD-10-CM

## 2023-06-11 DIAGNOSIS — R296 Repeated falls: Secondary | ICD-10-CM | POA: Diagnosis not present

## 2023-06-11 DIAGNOSIS — M6281 Muscle weakness (generalized): Secondary | ICD-10-CM | POA: Diagnosis not present

## 2023-06-11 DIAGNOSIS — R41841 Cognitive communication deficit: Secondary | ICD-10-CM | POA: Diagnosis not present

## 2023-06-11 DIAGNOSIS — R278 Other lack of coordination: Secondary | ICD-10-CM

## 2023-06-11 DIAGNOSIS — R471 Dysarthria and anarthria: Secondary | ICD-10-CM | POA: Diagnosis not present

## 2023-06-11 NOTE — Therapy (Signed)
 OUTPATIENT PHYSICAL THERAPY NEURO TREATMENT   Patient Name: Brittany Benton MRN: [id number] DOB:[birth year], 68 y.o., female Today's Date: 06/11/2023   PCP: Brittany Limes, MD REFERRING PROVIDER: Lonell Face, MD   END OF SESSION:    PT End of Session - 06/11/23 1538     Visit Number 4    Number of Visits 24    Date for PT Re-Evaluation 08/20/23    Authorization Type [employer] Medicare 2025    PT Start Time 1537    PT Stop Time 1618    PT Time Calculation (min) 41 min    Equipment Utilized During Treatment Gait belt    Activity Tolerance Patient tolerated treatment well;No increased pain    Behavior During Therapy [facility] for tasks assessed/performed                Past Medical History:  Diagnosis Date   Allergy 1989   Anemia    Anxiety    Arthritis    osteoarthritis -right hip   Cataract    Depression    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   GERD (gastroesophageal reflux disease)    Hernia, incisional    after renal surgery   History of chicken pox    Hyperlipidemia    Hypertension    Renal cell carcinoma (HCC)    s/p right nephrectomy 1994   Sleep apnea    Thyroid disease    Past Surgical History:  Procedure Laterality Date   APPENDECTOMY  1994   BIOPSY  11/07/2022   Procedure: BIOPSY;  Surgeon: Brittany Mood, MD;  Location: [facility] ENDOSCOPY;  Service: Gastroenterology;;   BREAST BIOPSY Right 1991   Negative   CHOLECYSTECTOMY  1994   COLONOSCOPY WITH PROPOFOL N/A 11/07/2022   Procedure: COLONOSCOPY WITH PROPOFOL;  Surgeon: Brittany Mood, MD;  Location: [facility] ENDOSCOPY;  Service: Gastroenterology;  Laterality: N/A;   ESOPHAGOGASTRODUODENOSCOPY (EGD) WITH PROPOFOL N/A 11/07/2022   Procedure: ESOPHAGOGASTRODUODENOSCOPY (EGD) WITH PROPOFOL;  Surgeon: Brittany Mood, MD;  Location: [facility] ENDOSCOPY;  Service: Gastroenterology;  Laterality: N/A;   JOINT REPLACEMENT  2019   LAPAROSCOPIC GASTRIC SLEEVE RESECTION  02/02/2016   Dr Brittany Benton at [facility]   NEPHRECTOMY   1994   Renal Cell Carcinoma   parotid gland removal  1990   also removed a tumor   POLYPECTOMY  11/07/2022   Procedure: POLYPECTOMY INTESTINAL;  Surgeon: Brittany Mood, MD;  Location: [facility] ENDOSCOPY;  Service: Gastroenterology;;   TONSILLECTOMY     TOTAL HIP ARTHROPLASTY Right 08/08/2016   Procedure: TOTAL HIP ARTHROPLASTY;  Surgeon: Brittany Saint, MD;  Location: [facility] ORS;  Service: Orthopedics;  Laterality: Right;   TUBAL LIGATION  1979   Patient Active Problem List   Diagnosis Date Noted   B12 deficiency 06/11/2022   Iron deficiency anemia 04/10/2022   Morbid obesity (HCC) 03/30/2022   Vitamin A deficiency 08/15/2021   Parkinson's disease (HCC) 08/03/2021   Acute urinary retention 08/03/2021   Meningioma (HCC) 04/05/2021   Hypothyroidism 08/15/2020   Osteopenia 06/14/2020   Status post total hip replacement, 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   Osteoarthritis of right hip 03/26/2016   H/O gastric bypass 03/26/2016   Sinus tachycardia 08/01/2015   Obesity (BMI 30-39.9) 06/08/2015   OSA (obstructive sleep apnea) 06/08/2015   GERD without esophagitis 06/08/2015   Incisional hernia 06/08/2015   Arthritis of hip 03/23/2015   Left knee pain 02/15/2015   Vitamin D deficiency 09/29/2014   Lump of right breast 09/22/2014   Prediabetes 09/22/2014   Pruritic dermatitis  09/22/2014   Obstructive sleep apnea 09/22/2014   Allergic rhinitis 09/15/2009   Hypersomnia 09/14/2009   Panic disorder 08/06/2008   Essential (primary) hypertension 07/16/2007   Hyperlipidemia 07/16/2007    ONSET DATE: Approximately 2 years ago  REFERRING DIAG: R26.89 (ICD-10-CM) - Balance problem   THERAPY DIAG:  Muscle weakness (generalized)  Repeated falls  Difficulty in walking, not elsewhere classified  Other lack of coordination  Rationale for Evaluation and Treatment: Rehabilitation  SUBJECTIVE:                                                                                                                                                                                              SUBJECTIVE STATEMENT:  Referring to pt's dizziness: Pt reports there have been times where she thinks "oh it's going on, but I don't feel anything." And states over the past few days she "hasn't had anything." Reports she has a ringing in her ear (unable to recall the side and may be in both). Pt reports her mother also had hx of dizziness throughout her life that would cause her to vomit. Denies stumbles/falls since last visit and reports she is feeling more confident in her walking. No reported changes in her medical status. Denies pain.    Pt accompanied by: significant other, Brittany Benton  PERTINENT HISTORY: Pt is a 68y.o. female with concern for Parkinsonism due to resting tremors (R>L) with occasional whole-body jerks/myoclonus, bradykinesia, hallucinations while waking up, bilateral bradykinesia (L>R), minimal cogwheeling bilaterally, sensation of stiffness, speech changes, and imbalance (trialed Carbidopa-Levodopa, but intolerable due to side effects of severe nausea & vomiting). Pt with hx of frequent falls and bilateral LE weakness. Pt reports hx of kidney removal on R side with large abdominal incision and R hip replacement.  PAIN:  Are you having pain? No  PRECAUTIONS: Fall  RED FLAGS: None   WEIGHT BEARING RESTRICTIONS: No  FALLS: Has patient fallen in last 6 months? Yes. Number of falls 5-6 (2 in driveway, 1 on sidewalk)  Most recent fall last Thursday 05/23/2023, pt reports she became "disoriented" and had LOB, but no injuries- pt states in general she is "very easily distracted and then she will misstep causing her to have LOB"  LIVING ENVIRONMENT: Lives with: lives with their spouse Brittany Benton) Lives in: House/apartment Stairs: Yes: Internal: reports she doesn't have to go to different floors in house (4 steps inside); and External: 3-4 steps to enter with R HRs + courtyard + additional 4-5 steps onto porch with B HRs -   entrance has steep driveway Has following equipment at home: Dan Humphreys - 4 wheeled, shower bench, grabbars  in bathroom, bedrail, and Ramped entry from garage (but has to go up steep driveway)  PLOF: Independent with basic ADLs, Independent with household mobility with device, Independent with household mobility without device, and Requires assistive device for independence Reports started using rollator more consistently ~2-3weeks ago but has used it on/off for a while  PATIENT GOALS: Wants to feel comfortable moving around and be shown how to do that again  OBJECTIVE:  Note: Objective measures were completed at Evaluation unless otherwise noted.  DIAGNOSTIC FINDINGS:   CT Head without Contrast 04/03/2021: Chronic atrophic changes without acute abnormality.   MRI Brain without Contrast 04/04/2021: 1. No acute intracranial abnormality. 2. Mild age-related cerebral atrophy with chronic small vessel ischemic disease. 3. 1.1 cm meningioma overlying the left frontal convexity without associated mass effect or edema.   CT Cervical Spine without Contrast 04/03/2021: Moderate severity multilevel degenerative changes, most prominent at the levels of C4-C5, C5-C6 and C6-C7. No evidence of an acute fracture or subluxation.   COGNITION: Overall cognitive status: Within functional limits for tasks assessed   SENSATION: WFL  COORDINATION: WFL for basic functional mobility tasks  EDEMA:  None observed  MUSCLE TONE: WNL  MUSCLE LENGTH: Not tested  DTRs:  Not tested  POSTURE: rounded shoulders, forward head, and increased lumbar lordosis  LOWER EXTREMITY ROM:     Passive  All WFL for basic functional mobility tasks Right Eval Left Eval  Hip flexion    Hip extension    Hip abduction    Hip adduction    Hip internal rotation    Hip external rotation    Knee flexion    Knee extension    Ankle dorsiflexion    Ankle plantarflexion    Ankle inversion    Ankle eversion     (Blank rows  = not tested)  LOWER EXTREMITY MMT:    MMT Right Eval Left Eval  Hip flexion 3+ 4  Hip extension    Hip abduction    Hip adduction    Hip internal rotation    Hip external rotation    Knee flexion 4- 4-  Knee extension 4 4  Ankle dorsiflexion 4 4-  Ankle plantarflexion 4- 4-  Ankle inversion    Ankle eversion    (Blank rows = not tested)  Manual Muscle Test Scale 0/5 = No muscle contraction can be seen or felt 1/5 = Contraction can be felt, but there is no motion 2-/5 = Part moves through incomplete ROM w/ gravity decreased 2/5 = Part moves through complete ROM w/ gravity decreased 2+/5 = Part moves through incomplete ROM (<50%) against gravity or through complete ROM w/ gravity 3-/5 = Part moves through incomplete ROM (>50%) against gravity 3/5 = Part moves through complete ROM against gravity 3+/5 = Part moves through complete ROM against gravity/slight resistance 4-/5= Holds test position against slight to moderate pressure 4/5 = Part moves through complete ROM against gravity/moderate resistance 4+/5= Holds test position against moderate to strong pressure 5/5 = Part moves through complete ROM against gravity/full resistance  BED MOBILITY:  Sit to supine Min A and requires used of bedrail Supine to sit Min A and requires used of bedrail Rolling to Right Min A Rolling to Left Min A Relies on bedrial, difficulty with lateral scooting in the bed (or in sitting)  TRANSFERS: Assistive device utilized: Environmental consultant - 4 wheeled  Sit to stand: SBA and CGA Stand to sit: SBA and CGA Chair to chair: SBA and CGA Without AD  requires more consistent CGA/light min A Floor:  not assessed  RAMP:  Assistive device utilized: Walker - 4 wheeled Ramp Comments: would benefit from assessment of this when able due to steep driveway at home  CURB:   Curb Comments: Not assessed  STAIRS: Level of Assistance: CGA Stair Negotiation Technique: Step to Pattern with Bilateral Rails  ascending with R LE and descending with L LE Number of Stairs: 4  Height of Stairs: 6in  Comments: Pt reports feeling more confident when having B HR support  GAIT: Gait pattern: step through pattern, decreased step length- Right, decreased step length- Left, decreased stride length, shuffling, trendelenburg, lateral hip instability, lateral lean- Right, lateral lean- Left, wide BOS, poor foot clearance- Right, and poor foot clearance- Left --- R/L lateral trunk lean over stance limb, decreased step lengths bilaterally, partial shuffled gait, wide BOS, increased balance instability Distance walked: ~13ft using HHA progressed to no UE support Assistive device utilized:  HHA progressed to no UE support Level of assistance: CGA and Min A Comments: Slow gait speed  FUNCTIONAL TESTS:  5 times sit to stand: on 06/04/2023: 59.25 seconds Timed up and go (TUG): 24.90sec without AD, 22.25 with rollator 3 minute walk test: on 06/04/2023: 223ft using rollator = 80.10meters 10 meter walk test: 36.75 seconds = 0.43m/s without AD; 06/04/23 0.62m/s average gait speed during 3 minute walk test using rollator  Berg Balance Scale: on 06/04/2023 = 31/56 Would benefit from Modified CTSIB & FGA  PATIENT SURVEYS:  ABC scale 17.5%  Details: patient 0% confident with majority of items; 50% confident household level ambulation, 40% stairs, 50% sweeping floor, 50% getting in/out of car, and 90% confident ambulating up/down ramp with rails    Vestibular Assessment  - performed on 06/07/2023 - re-assessed on 2/25 with details in that treatment note  OCULOMOTOR EXAM:  Ocular Alignment: normal  Ocular ROM: No Limitations  Spontaneous Nystagmus: absent  Gaze-Induced Nystagmus: absent and continues to have difficulty keeping eyes on target, especially towards L  Smooth Pursuits:  difficulty keeping eyes on target  Saccades: hypometric/undershoots, extra eye movements, and difficulty finding target on L  side  Convergence/Divergence: not assessed Cover/Cross Cover: WNL  VESTIBULAR - OCULAR REFLEX:   VOR Cancellation: Unable to Maintain Gaze, inconsistent ability to keep eyes on target  Head-Impulse Test: HIT Right: negative HIT Left: negative -- had a difficult time keeping eyes on target in both directions  Dynamic Visual Acuity:  would benefit from future testing    POSITIONAL TESTING: Right Dix-Hallpike: upbeating, right nystagmus Left Dix-Hallpike: benefit from testing at next visit Right Roll Test: no nystagmus Left Roll Test: no nystagmus Left Sidelying: no nystagmus  MOTION SENSITIVITY:  May benefit from testing in future visits                                                                                                   TREATMENT DATE:   06/11/23   Recommended following up with ENT if concerned about hearing loss.  Repeated vestibular assessment as follows: R Loaded Dix-Hallpike: positive, upbeat nystagmus lasting <30seconds; treated with Canalith  Repositioning Maneuver  Re-tested R Loaded Dix-Hallpike: negative for nystagmus and symptoms; however, upon returning to sitting pt with strong symptoms lasting <1 minute  L Loaded Dix-Hallpike: no nystagmus, but pt with sudden onset of feeling nauseous so returned to sitting EOM and provided pt with ice pack for her neck and emesis bag, but no emesis production  Improved symptoms with seated EOM rest break using ice pack. Deferred further vestibular assessment today.  Unless otherwise stated, CGA was provided and gait belt donned in order to ensure pt safety  Gait training ~348ft using rollator with close SBA for safety and progressing to head rotations to visually locate targets on wall in hallway for ~41ft - pt denies any symptoms and at end of gait training pt reports feeling "completely normal."   Gait training ~169ft, no AD, with CGA/light min A for steadying - pt with increased confidence at first with more relaxed  UB posturing and slight increased gait speed, but with fatigue reverted to guarded UB posturing with increased tremors in B UEs. Included ~17ft of backwards ambulation, no UE support, with CGA, but pt becomes nervous with this and has to stop - demos small, shuffled backwards steps.  Pt reports feeling good at end of session and is very grateful for receiving vestibular treatments. Reports overall it has made her feel better and have more confidence with her balance.   PATIENT EDUCATION:  Education details: PT POC, balance impairments and increased fall risk based on outcome measures, HEP Person educated: Patient and Spouse Education method: Explanation Education comprehension: verbalized understanding and needs further education  HOME EXERCISE PROGRAM:  Access Code: [email or web address] URL: [email or web address] Date: 06/04/2023 Prepared by: Casimiro Needle  Exercises - Walking with Rollator - 1 x daily - 7 x weekly - 3-5 sets - 3 minutes hold - Sit to Stand with Hands on Knees  - 1 x daily - 7 x weekly - 2 sets - 10 reps - Standing March with Counter Support  - 1 x daily - 7 x weekly - 2 sets - 10 reps  GOALS: Goals reviewed with patient? Yes  SHORT TERM GOALS: Target date: 07/09/2023   Patient will be independent in home exercise program to improve strength/mobility for better functional independence with ADLs. Baseline: provided on 06/04/2023 Goal status: INITIAL  2. Patient will improve ABC scale by 20% indicating increased confidence performing daily mobility tasks, which is associated with fall risk.    Baseline: 05/28/2023 = 17.5%   Goal Status: INITIAL  LONG TERM GOALS: Target date: 08/20/2023   Patient (> 67 years old) will complete five times sit to stand test in < 15 seconds indicating an increased LE strength and improved balance. Baseline: 06/04/2023: 59.25 seconds Goal status: INITIAL  Patient will increase Berg Balance score by > 6 points to demonstrate decreased  fall risk during functional activities. Baseline: 06/04/2023: 31/56 Goal status: INITIAL   Patient will reduce timed up and go to <11 seconds to reduce fall risk and demonstrate improved transfer/gait ability. Baseline: 05/28/2023 = 22.25sec using rollator vs 24.90 seconds without AD Goal status: INITIAL  Patient will increase 10 meter walk test to >0.66m/s as to improve gait speed for better community ambulation and to reduce fall risk. Baseline: 05/28/2023 = 0.50m/s Goal status: INITIAL  Patient will increase 3 minute walk test distance to >368ft for progression to limited community ambulation and improved gait ability Baseline: 06/04/2023 = 287ft (80.52meters) Goal status: INITIAL   ASSESSMENT:  CLINICAL IMPRESSION:  Patient is a  68 y.o. female who was seen today for physical therapy treatment for impaired balance and gait deficits due to Parkinsonism. Repeated vestibular positional testing today with pt again found to have positive R Loaded Dix-Hallpike test and therefore canalith repositioning maneuver was performed with pt no longer having symptoms nor nystagmus on repeat testing. However, was symptomatic when returning to sitting upright when retesting R Gilberto Better for <59min. Assessed L Loaded Dix-Hallpike but unable to complete test due to pt spontaneously becoming nauseous. Allowed symptoms to dissipate and pt able to participate in gait training for endurance and dynamic challenge. Patient continues to be greatly appreciative of the vestibular treatment today. Patient will benefit from continued follow-up vestibular testing if warranted with progression to dynamic balance and gait tasks with dual-task gaze stabilizations activities. In addition to that, pt will benefit from functional B LE strengthening exercises as well. Ms. Couzens will benefit from continued skilled PT to improve these deficits in order to increase QOL and ease/safety with ADLs and functional mobility.  OBJECTIVE  IMPAIRMENTS: Abnormal gait, cardiopulmonary status limiting activity, decreased activity tolerance, decreased balance, decreased endurance, decreased mobility, difficulty walking, and decreased strength.   ACTIVITY LIMITATIONS: carrying, lifting, bending, standing, squatting, stairs, transfers, bed mobility, reach over head, and locomotion level  PARTICIPATION LIMITATIONS: meal prep, cleaning, laundry, shopping, and community activity  PERSONAL FACTORS: Age, Behavior pattern, Fitness, Time since onset of injury/illness/exacerbation, and 3+ comorbidities: Parkinsonism, anxiety, arthritis, depression, HTN  are also affecting patient's functional outcome.   REHAB POTENTIAL: Good  CLINICAL DECISION MAKING: Evolving/moderate complexity  EVALUATION COMPLEXITY: Moderate  PLAN:  PT FREQUENCY: 1-2x/week  PT DURATION: 12 weeks  PLANNED INTERVENTIONS: [id number]- PT Re-evaluation, 97110-Therapeutic exercises, 97530- Therapeutic activity, 97112- Neuromuscular re-education, 97535- Self Care, [id number]- Manual therapy, [phone]- Gait training, [phone]- Canalith repositioning, Patient/Family education, Balance training, Stair training, Vestibular training, Visual/preceptual remediation/compensation, Cognitive remediation, and DME instructions   PLAN FOR NEXT SESSION:   - follow-up on BPPV testing if warranted (need to perform L Gilberto Better, R CRM has been performed x2 reps) - dynamic balance and gait challenges with gaze stabilization dual-task challenges - B LE functional strengthening     Libero Puthoff Verdell Benton, PT, DPT, NCS, CSRS Physical Therapist - [place]  [facility]  5:56 PM 06/11/23

## 2023-06-12 ENCOUNTER — Ambulatory Visit: Payer: Medicare Other | Admitting: Physical Therapy

## 2023-06-12 ENCOUNTER — Encounter: Payer: Medicare Other | Admitting: Occupational Therapy

## 2023-06-12 ENCOUNTER — Encounter: Payer: Medicare Other | Admitting: Speech Pathology

## 2023-06-17 ENCOUNTER — Encounter: Payer: Medicare Other | Admitting: Speech Pathology

## 2023-06-17 ENCOUNTER — Encounter: Payer: Medicare Other | Admitting: Occupational Therapy

## 2023-06-18 ENCOUNTER — Encounter: Payer: Medicare Other | Admitting: Speech Pathology

## 2023-06-18 ENCOUNTER — Ambulatory Visit: Payer: Medicare Other | Attending: Neurology | Admitting: Physical Therapy

## 2023-06-18 DIAGNOSIS — R296 Repeated falls: Secondary | ICD-10-CM

## 2023-06-18 DIAGNOSIS — M6281 Muscle weakness (generalized): Secondary | ICD-10-CM | POA: Diagnosis not present

## 2023-06-18 DIAGNOSIS — R262 Difficulty in walking, not elsewhere classified: Secondary | ICD-10-CM | POA: Diagnosis not present

## 2023-06-18 DIAGNOSIS — R42 Dizziness and giddiness: Secondary | ICD-10-CM | POA: Insufficient documentation

## 2023-06-18 DIAGNOSIS — R278 Other lack of coordination: Secondary | ICD-10-CM | POA: Diagnosis not present

## 2023-06-18 NOTE — Therapy (Signed)
 OUTPATIENT PHYSICAL THERAPY NEURO TREATMENT   Patient Name: Brittany Benton MRN: [id number] DOB:[birth year], 68 y.o., female Today's Date: 06/18/2023   PCP: Malva Limes, MD REFERRING PROVIDER: Lonell Face, MD   END OF SESSION:    PT End of Session - 06/18/23 1528     Visit Number 5    Number of Visits 24    Date for PT Re-Evaluation 08/20/23    Authorization Type [employer] Medicare 2025    PT Start Time 1534    PT Stop Time 1615    PT Time Calculation (min) 41 min    Equipment Utilized During Treatment Gait belt    Activity Tolerance Patient tolerated treatment well;No increased pain    Behavior During Therapy [facility] for tasks assessed/performed                 Past Medical History:  Diagnosis Date   Allergy 1989   Anemia    Anxiety    Arthritis    osteoarthritis -right hip   Cataract    Depression    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   GERD (gastroesophageal reflux disease)    Hernia, incisional    after renal surgery   History of chicken pox    Hyperlipidemia    Hypertension    Renal cell carcinoma (HCC)    s/p right nephrectomy 1994   Sleep apnea    Thyroid disease    Past Surgical History:  Procedure Laterality Date   APPENDECTOMY  1994   BIOPSY  11/07/2022   Procedure: BIOPSY;  Surgeon: Wyline Mood, MD;  Location: [facility] ENDOSCOPY;  Service: Gastroenterology;;   BREAST BIOPSY Right 1991   Negative   CHOLECYSTECTOMY  1994   COLONOSCOPY WITH PROPOFOL N/A 11/07/2022   Procedure: COLONOSCOPY WITH PROPOFOL;  Surgeon: Wyline Mood, MD;  Location: [facility] ENDOSCOPY;  Service: Gastroenterology;  Laterality: N/A;   ESOPHAGOGASTRODUODENOSCOPY (EGD) WITH PROPOFOL N/A 11/07/2022   Procedure: ESOPHAGOGASTRODUODENOSCOPY (EGD) WITH PROPOFOL;  Surgeon: Wyline Mood, MD;  Location: [facility] ENDOSCOPY;  Service: Gastroenterology;  Laterality: N/A;   JOINT REPLACEMENT  2019   LAPAROSCOPIC GASTRIC SLEEVE RESECTION  02/02/2016   Dr Alva Garnet at [facility]   NEPHRECTOMY   1994   Renal Cell Carcinoma   parotid gland removal  1990   also removed a tumor   POLYPECTOMY  11/07/2022   Procedure: POLYPECTOMY INTESTINAL;  Surgeon: Wyline Mood, MD;  Location: [facility] ENDOSCOPY;  Service: Gastroenterology;;   TONSILLECTOMY     TOTAL HIP ARTHROPLASTY Right 08/08/2016   Procedure: TOTAL HIP ARTHROPLASTY;  Surgeon: Deeann Saint, MD;  Location: [facility] ORS;  Service: Orthopedics;  Laterality: Right;   TUBAL LIGATION  1979   Patient Active Problem List   Diagnosis Date Noted   B12 deficiency 06/11/2022   Iron deficiency anemia 04/10/2022   Morbid obesity (HCC) 03/30/2022   Vitamin A deficiency 08/15/2021   Parkinson's disease (HCC) 08/03/2021   Acute urinary retention 08/03/2021   Meningioma (HCC) 04/05/2021   Hypothyroidism 08/15/2020   Osteopenia 06/14/2020   Status post total hip replacement, 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   Osteoarthritis of right hip 03/26/2016   H/O gastric bypass 03/26/2016   Sinus tachycardia 08/01/2015   Obesity (BMI 30-39.9) 06/08/2015   OSA (obstructive sleep apnea) 06/08/2015   GERD without esophagitis 06/08/2015   Incisional hernia 06/08/2015   Arthritis of hip 03/23/2015   Left knee pain 02/15/2015   Vitamin D deficiency 09/29/2014   Lump of right breast 09/22/2014   Prediabetes 09/22/2014   Pruritic  dermatitis 09/22/2014   Obstructive sleep apnea 09/22/2014   Allergic rhinitis 09/15/2009   Hypersomnia 09/14/2009   Panic disorder 08/06/2008   Essential (primary) hypertension 07/16/2007   Hyperlipidemia 07/16/2007    ONSET DATE: Approximately 2 years ago  REFERRING DIAG: R26.89 (ICD-10-CM) - Balance problem   THERAPY DIAG:  Muscle weakness (generalized)  Repeated falls  Difficulty in walking, not elsewhere classified  Other lack of coordination  Rationale for Evaluation and Treatment: Rehabilitation  SUBJECTIVE:                                                                                                                                                                                              SUBJECTIVE STATEMENT:  Pt reports she has been "very well." Reports she is no longer having any dizziness. Pt reports over the last 2-3 years she has had on/off episodes of her legs "feeling very heavy" until she changes positions, by going to sit down, and then the symptoms resolve - reports she can't identify a trigger to this happening. Reports she feels like her muscles "tense up" and then relax back out after sitting. Denies experiencing this today, reports last time she experienced this was yesterday.  Denies stumbles/falls since last visit and reports she is still feeling more confident in her walking and walking more without AD around the house and it is going good. No reported changes in her medical status. Denies pain.    Pt accompanied by: significant other, [name]  PERTINENT HISTORY: Pt is a 68y.o. female with concern for Parkinsonism due to resting tremors (R>L) with occasional whole-body jerks/myoclonus, bradykinesia, hallucinations while waking up, bilateral bradykinesia (L>R), minimal cogwheeling bilaterally, sensation of stiffness, speech changes, and imbalance (trialed Carbidopa-Levodopa, but intolerable due to side effects of severe nausea & vomiting). Pt with hx of frequent falls and bilateral LE weakness. Pt reports hx of kidney removal on R side with large abdominal incision and R hip replacement.  PAIN:  Are you having pain? No  PRECAUTIONS: Fall  RED FLAGS: None   WEIGHT BEARING RESTRICTIONS: No  FALLS: Has patient fallen in last 6 months? Yes. Number of falls 5-6 (2 in driveway, 1 on sidewalk)  Most recent fall last Thursday 05/23/2023, pt reports she became "disoriented" and had LOB, but no injuries- pt states in general she is "very easily distracted and then she will misstep causing her to have LOB"  LIVING ENVIRONMENT: Lives with: lives with their spouse [name]) Lives in: House/apartment Stairs:  Yes: Internal: reports she doesn't have to go to different floors in house (4 steps inside); and External: 3-4 steps to enter with  R HRs + courtyard + additional 4-5 steps onto porch with B HRs -  entrance has steep driveway Has following equipment at home: Dan Humphreys - 4 wheeled, shower bench, grabbars in bathroom, bedrail, and Ramped entry from garage (but has to go up steep driveway)  PLOF: Independent with basic ADLs, Independent with household mobility with device, Independent with household mobility without device, and Requires assistive device for independence Reports started using rollator more consistently ~2-3weeks ago but has used it on/off for a while  PATIENT GOALS: Wants to feel comfortable moving around and be shown how to do that again  OBJECTIVE:  Note: Objective measures were completed at Evaluation unless otherwise noted.  DIAGNOSTIC FINDINGS:   CT Head without Contrast 04/03/2021: Chronic atrophic changes without acute abnormality.   MRI Brain without Contrast 04/04/2021: 1. No acute intracranial abnormality. 2. Mild age-related cerebral atrophy with chronic small vessel ischemic disease. 3. 1.1 cm meningioma overlying the left frontal convexity without associated mass effect or edema.   CT Cervical Spine without Contrast 04/03/2021: Moderate severity multilevel degenerative changes, most prominent at the levels of C4-C5, C5-C6 and C6-C7. No evidence of an acute fracture or subluxation.   COGNITION: Overall cognitive status: Within functional limits for tasks assessed   SENSATION: WFL  COORDINATION: WFL for basic functional mobility tasks  EDEMA:  None observed  MUSCLE TONE: WNL  MUSCLE LENGTH: Not tested  DTRs:  Not tested  POSTURE: rounded shoulders, forward head, and increased lumbar lordosis  LOWER EXTREMITY ROM:     Passive  All WFL for basic functional mobility tasks Right Eval Left Eval  Hip flexion    Hip extension    Hip abduction    Hip  adduction    Hip internal rotation    Hip external rotation    Knee flexion    Knee extension    Ankle dorsiflexion    Ankle plantarflexion    Ankle inversion    Ankle eversion     (Blank rows = not tested)  LOWER EXTREMITY MMT:    MMT Right Eval Left Eval  Hip flexion 3+ 4  Hip extension    Hip abduction    Hip adduction    Hip internal rotation    Hip external rotation    Knee flexion 4- 4-  Knee extension 4 4  Ankle dorsiflexion 4 4-  Ankle plantarflexion 4- 4-  Ankle inversion    Ankle eversion    (Blank rows = not tested)  Manual Muscle Test Scale 0/5 = No muscle contraction can be seen or felt 1/5 = Contraction can be felt, but there is no motion 2-/5 = Part moves through incomplete ROM w/ gravity decreased 2/5 = Part moves through complete ROM w/ gravity decreased 2+/5 = Part moves through incomplete ROM (<50%) against gravity or through complete ROM w/ gravity 3-/5 = Part moves through incomplete ROM (>50%) against gravity 3/5 = Part moves through complete ROM against gravity 3+/5 = Part moves through complete ROM against gravity/slight resistance 4-/5= Holds test position against slight to moderate pressure 4/5 = Part moves through complete ROM against gravity/moderate resistance 4+/5= Holds test position against moderate to strong pressure 5/5 = Part moves through complete ROM against gravity/full resistance  BED MOBILITY:  Sit to supine Min A and requires used of bedrail Supine to sit Min A and requires used of bedrail Rolling to Right Min A Rolling to Left Min A Relies on bedrial, difficulty with lateral scooting in the bed (or in  sitting)  TRANSFERS: Assistive device utilized: Environmental consultant - 4 wheeled  Sit to stand: SBA and CGA Stand to sit: SBA and CGA Chair to chair: SBA and CGA Without AD requires more consistent CGA/light min A Floor:  not assessed  RAMP:  Assistive device utilized: Environmental consultant - 4 wheeled Ramp Comments: would benefit from assessment  of this when able due to steep driveway at home  CURB:   Curb Comments: Not assessed  STAIRS: Level of Assistance: CGA Stair Negotiation Technique: Step to Pattern with Bilateral Rails ascending with R LE and descending with L LE Number of Stairs: 4  Height of Stairs: 6in  Comments: Pt reports feeling more confident when having B HR support  GAIT: Gait pattern: step through pattern, decreased step length- Right, decreased step length- Left, decreased stride length, shuffling, trendelenburg, lateral hip instability, lateral lean- Right, lateral lean- Left, wide BOS, poor foot clearance- Right, and poor foot clearance- Left --- R/L lateral trunk lean over stance limb, decreased step lengths bilaterally, partial shuffled gait, wide BOS, increased balance instability Distance walked: ~36ft using HHA progressed to no UE support Assistive device utilized:  HHA progressed to no UE support Level of assistance: CGA and Min A Comments: Slow gait speed  FUNCTIONAL TESTS:  5 times sit to stand: on 06/04/2023: 59.25 seconds Timed up and go (TUG): 24.90sec without AD, 22.25 with rollator 3 minute walk test: on 06/04/2023: 258ft using rollator = 80.49meters 10 meter walk test: 36.75 seconds = 0.38m/s without AD; 06/04/23 0.11m/s average gait speed during 3 minute walk test using rollator  Berg Balance Scale: on 06/04/2023 = 31/56 Would benefit from Modified CTSIB & FGA  PATIENT SURVEYS:  ABC scale 17.5%  Details: patient 0% confident with majority of items; 50% confident household level ambulation, 40% stairs, 50% sweeping floor, 50% getting in/out of car, and 90% confident ambulating up/down ramp with rails    Vestibular Assessment  - performed on 06/07/2023 - re-assessed on 2/25 with details in that treatment note  OCULOMOTOR EXAM:  Ocular Alignment: normal  Ocular ROM: No Limitations  Spontaneous Nystagmus: absent  Gaze-Induced Nystagmus: absent and continues to have difficulty keeping eyes on  target, especially towards L  Smooth Pursuits:  difficulty keeping eyes on target  Saccades: hypometric/undershoots, extra eye movements, and difficulty finding target on L side  Convergence/Divergence: not assessed Cover/Cross Cover: WNL  VESTIBULAR - OCULAR REFLEX:   VOR Cancellation: Unable to Maintain Gaze, inconsistent ability to keep eyes on target  Head-Impulse Test: HIT Right: negative HIT Left: negative -- had a difficult time keeping eyes on target in both directions  Dynamic Visual Acuity:  would benefit from future testing    POSITIONAL TESTING: Right Dix-Hallpike: upbeating, right nystagmus Left Dix-Hallpike: benefit from testing at next visit Right Roll Test: no nystagmus Left Roll Test: no nystagmus Left Sidelying: no nystagmus  MOTION SENSITIVITY:  May benefit from testing in future visits                                                                                                   TREATMENT DATE:  06/18/23   Deferred any additional vestibular testing due to pt denying any dizziness since last visit and pt requesting no further vestibular treatment at this time.  Unless otherwise stated, CGA was provided and gait belt donned in order to ensure pt safety throughout session.  Gait training 117ft, no UE support, with CGA/light min assist for steadying. Pt with increased confidence at first with more relaxed UB posturing and slight increased gait speed, but with fatigue reverted to guarded UB posturing with increased tremors in B UEs and slower gait speed with wider BOS.  Dynamic balance training at balance bar for pt confidence including:  - alternating foot taps to green step with 1x purple plate [id number] each with R HHA and min A for balance due to minor posterior lean - pt with greater difficulty lifting L LE compared to R - forward step-ups on green step x10reps each LE with R HHA for balance and using light L UE support on balance bar  ---- increased  difficulty powering up through L LE compared to R  Stair navigation training ascending/descending 8 steps (6" height) using B HRs with CGA/light min assist for steadying - reciprocal vs step-to pattern leading with R LE on ascent and then step-to leading with LLE on descent.   Pt reports onset of having some "weakness" feeling in her LEs after stairs- likely due to fatigue - pt reports she does have a hx of B LE gradual knee buckling.  Sit<>stands, arms across chest, x5 reps from standard green chair with light min A to lift hips due to decreased ability to power up.  Standing narrow BOS and then semi-tandem 30sec each with eyes closed - only has slight instability with anterior lean bias, requiring CGA for safety. Progressed to standing normal BOS on airex 30sec with eyes closed - slight instability primarily posteriorly.  Discussed pt's bedrail not fitting on bed due to new mattress - discussed option of purchasing bunkie board to help with correct mattress height to allow bedrail to fit.    PATIENT EDUCATION:  Education details: PT POC, balance impairments and increased fall risk based on outcome measures, HEP Person educated: Patient and Spouse Education method: Explanation Education comprehension: verbalized understanding and needs further education  HOME EXERCISE PROGRAM:  Access Code: [email or web address] URL: [email or web address] Date: 06/04/2023 Prepared by: Casimiro Needle  Exercises - Walking with Rollator - 1 x daily - 7 x weekly - 3-5 sets - 3 minutes hold - Sit to Stand with Hands on Knees  - 1 x daily - 7 x weekly - 2 sets - 10 reps - Standing March with Counter Support  - 1 x daily - 7 x weekly - 2 sets - 10 reps  GOALS: Goals reviewed with patient? Yes  SHORT TERM GOALS: Target date: 07/09/2023   Patient will be independent in home exercise program to improve strength/mobility for better functional independence with ADLs. Baseline: provided on 06/04/2023 Goal  status: INITIAL  2. Patient will improve ABC scale by 20% indicating increased confidence performing daily mobility tasks, which is associated with fall risk.    Baseline: 05/28/2023 = 17.5%   Goal Status: INITIAL  LONG TERM GOALS: Target date: 08/20/2023   Patient (> 74 years old) will complete five times sit to stand test in < 15 seconds indicating an increased LE strength and improved balance. Baseline: 06/04/2023: 59.25 seconds Goal status: INITIAL  Patient will increase Berg Balance score by > 6 points to demonstrate decreased fall risk during functional activities.  Baseline: 06/04/2023: 31/56 Goal status: INITIAL   Patient will reduce timed up and go to <11 seconds to reduce fall risk and demonstrate improved transfer/gait ability. Baseline: 05/28/2023 = 22.25sec using rollator vs 24.90 seconds without AD Goal status: INITIAL  Patient will increase 10 meter walk test to >0.68m/s as to improve gait speed for better community ambulation and to reduce fall risk. Baseline: 05/28/2023 = 0.77m/s Goal status: INITIAL  Patient will increase 3 minute walk test distance to >362ft for progression to limited community ambulation and improved gait ability Baseline: 06/04/2023 = 228ft (80.41meters) Goal status: INITIAL   ASSESSMENT:  CLINICAL IMPRESSION:  Patient is a 68 y.o. female who was seen today for physical therapy treatment for impaired balance and gait deficits due to Parkinsonism. Pt denies experiencing any dizziness since last visit; therefore, deferred any additional vestibular testing at this time. Therapy session focused on dynamic balance and B LE functional strengthening to decrease pt's risk for falls. Pt reports increased fear of falling throughout balance interventions benefiting from being close to balance bar for sense of security. Noticed weakness in L LE compared to R LE that impacts pt's balance especially during functional tasks of step-ups. Pt requires UE support for all  dynamic stepping challenges today. Pt will continue to benefit from B LE functional strengthening and dynamic stepping balance in variable directions with progression of this into dynamic gait training. Ms. Adcox will benefit from continued skilled PT to improve these deficits in order to increase QOL and ease/safety with ADLs and functional mobility.  OBJECTIVE IMPAIRMENTS: Abnormal gait, cardiopulmonary status limiting activity, decreased activity tolerance, decreased balance, decreased endurance, decreased mobility, difficulty walking, and decreased strength.   ACTIVITY LIMITATIONS: carrying, lifting, bending, standing, squatting, stairs, transfers, bed mobility, reach over head, and locomotion level  PARTICIPATION LIMITATIONS: meal prep, cleaning, laundry, shopping, and community activity  PERSONAL FACTORS: Age, Behavior pattern, Fitness, Time since onset of injury/illness/exacerbation, and 3+ comorbidities: Parkinsonism, anxiety, arthritis, depression, HTN  are also affecting patient's functional outcome.   REHAB POTENTIAL: Good  CLINICAL DECISION MAKING: Evolving/moderate complexity  EVALUATION COMPLEXITY: Moderate  PLAN:  PT FREQUENCY: 1-2x/week  PT DURATION: 12 weeks  PLANNED INTERVENTIONS: 97164- PT Re-evaluation, 97110-Therapeutic exercises, 97530- Therapeutic activity, 97112- Neuromuscular re-education, 97535- Self Care, [id number]- Manual therapy, [phone]- Gait training, [phone]- Canalith repositioning, Patient/Family education, Balance training, Stair training, Vestibular training, Visual/preceptual remediation/compensation, Cognitive remediation, and DME instructions   PLAN FOR NEXT SESSION:   - dynamic balance and gait challenges with gaze stabilization dual-task challenges  - turning and stepping in different directions over small obstacles - B LE functional strengthening     Sandro Burgo M Somalia Segler, PT, DPT, NCS, CSRS Physical Therapist - [place]  [facility]   5:49 PM 06/18/23

## 2023-06-19 ENCOUNTER — Encounter: Payer: Medicare Other | Admitting: Occupational Therapy

## 2023-06-19 ENCOUNTER — Encounter: Payer: Medicare Other | Admitting: Speech Pathology

## 2023-06-20 ENCOUNTER — Encounter: Payer: Medicare Other | Admitting: Speech Pathology

## 2023-06-20 ENCOUNTER — Ambulatory Visit: Payer: Medicare Other | Admitting: Physical Therapy

## 2023-06-20 DIAGNOSIS — R262 Difficulty in walking, not elsewhere classified: Secondary | ICD-10-CM | POA: Diagnosis not present

## 2023-06-20 DIAGNOSIS — R296 Repeated falls: Secondary | ICD-10-CM | POA: Diagnosis not present

## 2023-06-20 DIAGNOSIS — R278 Other lack of coordination: Secondary | ICD-10-CM | POA: Diagnosis not present

## 2023-06-20 DIAGNOSIS — R42 Dizziness and giddiness: Secondary | ICD-10-CM | POA: Diagnosis not present

## 2023-06-20 DIAGNOSIS — M6281 Muscle weakness (generalized): Secondary | ICD-10-CM

## 2023-06-20 NOTE — Therapy (Signed)
 OUTPATIENT PHYSICAL THERAPY NEURO TREATMENT   Patient Name: Brittany Benton MRN: [id number] DOB:[birth year], 68 y.o., female Today's Date: 06/20/2023   PCP: Malva Limes, MD REFERRING PROVIDER: Lonell Face, MD   END OF SESSION:    PT End of Session - 06/20/23 1540     Visit Number 6    Number of Visits 24    Date for PT Re-Evaluation 08/20/23    Authorization Type [employer] 2025 - auth [id number]/[id number] for 16 visits    PT Start Time 1538    PT Stop Time 1620    PT Time Calculation (min) 42 min    Equipment Utilized During Treatment Gait belt    Activity Tolerance Patient tolerated treatment well;No increased pain    Behavior During Therapy [facility] for tasks assessed/performed                  Past Medical History:  Diagnosis Date   Allergy 1989   Anemia    Anxiety    Arthritis    osteoarthritis -right hip   Cataract    Depression    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   GERD (gastroesophageal reflux disease)    Hernia, incisional    after renal surgery   History of chicken pox    Hyperlipidemia    Hypertension    Renal cell carcinoma (HCC)    s/p right nephrectomy 1994   Sleep apnea    Thyroid disease    Past Surgical History:  Procedure Laterality Date   APPENDECTOMY  1994   BIOPSY  11/07/2022   Procedure: BIOPSY;  Surgeon: Wyline Mood, MD;  Location: [facility] ENDOSCOPY;  Service: Gastroenterology;;   BREAST BIOPSY Right 1991   Negative   CHOLECYSTECTOMY  1994   COLONOSCOPY WITH PROPOFOL N/A 11/07/2022   Procedure: COLONOSCOPY WITH PROPOFOL;  Surgeon: Wyline Mood, MD;  Location: [facility] ENDOSCOPY;  Service: Gastroenterology;  Laterality: N/A;   ESOPHAGOGASTRODUODENOSCOPY (EGD) WITH PROPOFOL N/A 11/07/2022   Procedure: ESOPHAGOGASTRODUODENOSCOPY (EGD) WITH PROPOFOL;  Surgeon: Wyline Mood, MD;  Location: [facility] ENDOSCOPY;  Service: Gastroenterology;  Laterality: N/A;   JOINT REPLACEMENT  2019   LAPAROSCOPIC GASTRIC SLEEVE RESECTION  02/02/2016   Dr  Alva Garnet at [facility]   NEPHRECTOMY  1994   Renal Cell Carcinoma   parotid gland removal  1990   also removed a tumor   POLYPECTOMY  11/07/2022   Procedure: POLYPECTOMY INTESTINAL;  Surgeon: Wyline Mood, MD;  Location: [facility] ENDOSCOPY;  Service: Gastroenterology;;   TONSILLECTOMY     TOTAL HIP ARTHROPLASTY Right 08/08/2016   Procedure: TOTAL HIP ARTHROPLASTY;  Surgeon: Deeann Saint, MD;  Location: [facility] ORS;  Service: Orthopedics;  Laterality: Right;   TUBAL LIGATION  1979   Patient Active Problem List   Diagnosis Date Noted   B12 deficiency 06/11/2022   Iron deficiency anemia 04/10/2022   Morbid obesity (HCC) 03/30/2022   Vitamin A deficiency 08/15/2021   Parkinson's disease (HCC) 08/03/2021   Acute urinary retention 08/03/2021   Meningioma (HCC) 04/05/2021   Hypothyroidism 08/15/2020   Osteopenia 06/14/2020   Status post total hip replacement, 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   Osteoarthritis of right hip 03/26/2016   H/O gastric bypass 03/26/2016   Sinus tachycardia 08/01/2015   Obesity (BMI 30-39.9) 06/08/2015   OSA (obstructive sleep apnea) 06/08/2015   GERD without esophagitis 06/08/2015   Incisional hernia 06/08/2015   Arthritis of hip 03/23/2015   Left knee pain 02/15/2015   Vitamin D deficiency 09/29/2014   Lump of right breast 09/22/2014  Prediabetes 09/22/2014   Pruritic dermatitis 09/22/2014   Obstructive sleep apnea 09/22/2014   Allergic rhinitis 09/15/2009   Hypersomnia 09/14/2009   Panic disorder 08/06/2008   Essential (primary) hypertension 07/16/2007   Hyperlipidemia 07/16/2007    ONSET DATE: Approximately 2 years ago  REFERRING DIAG: R26.89 (ICD-10-CM) - Balance problem   THERAPY DIAG:  Muscle weakness (generalized)  Repeated falls  Difficulty in walking, not elsewhere classified  Other lack of coordination  Rationale for Evaluation and Treatment: Rehabilitation  SUBJECTIVE:                                                                                                                                                                                              SUBJECTIVE STATEMENT:  Pt reports she didn't get to sleep until 4AM and woke up between 6AM-7AM so she has been "out of it some" today.  Pt reports her sleep cycle has been better recently, but last night she couldn't get to sleep. (Has hx of irregular sleep cycle and going to bed around 3AM or 4AM) No reported changes in her medical status. Denies pain.   Pt accompanied by: significant other, [name]  PERTINENT HISTORY: Pt is a 68y.o. female with concern for Parkinsonism due to resting tremors (R>L) with occasional whole-body jerks/myoclonus, bradykinesia, hallucinations while waking up, bilateral bradykinesia (L>R), minimal cogwheeling bilaterally, sensation of stiffness, speech changes, and imbalance (trialed Carbidopa-Levodopa, but intolerable due to side effects of severe nausea & vomiting). Pt with hx of frequent falls and bilateral LE weakness. Pt reports hx of kidney removal on R side with large abdominal incision and R hip replacement.  PAIN:  Are you having pain? No  PRECAUTIONS: Fall  RED FLAGS: None   WEIGHT BEARING RESTRICTIONS: No  FALLS: Has patient fallen in last 6 months? Yes. Number of falls 5-6 (2 in driveway, 1 on sidewalk)  Most recent fall last Thursday 05/23/2023, pt reports she became "disoriented" and had LOB, but no injuries- pt states in general she is "very easily distracted and then she will misstep causing her to have LOB"  LIVING ENVIRONMENT: Lives with: lives with their spouse [name]) Lives in: House/apartment Stairs: Yes: Internal: reports she doesn't have to go to different floors in house (4 steps inside); and External: 3-4 steps to enter with R HRs + courtyard + additional 4-5 steps onto porch with B HRs -  entrance has steep driveway Has following equipment at home: Dan Humphreys - 4 wheeled, shower bench, grabbars in bathroom, bedrail, and Ramped entry from garage  (but has to go up steep driveway)  PLOF: Independent with basic ADLs, Independent with household  mobility with device, Independent with household mobility without device, and Requires assistive device for independence Reports started using rollator more consistently ~2-3weeks ago but has used it on/off for a while  PATIENT GOALS: Wants to feel comfortable moving around and be shown how to do that again  OBJECTIVE:  Note: Objective measures were completed at Evaluation unless otherwise noted.  DIAGNOSTIC FINDINGS:   CT Head without Contrast 04/03/2021: Chronic atrophic changes without acute abnormality.   MRI Brain without Contrast 04/04/2021: 1. No acute intracranial abnormality. 2. Mild age-related cerebral atrophy with chronic small vessel ischemic disease. 3. 1.1 cm meningioma overlying the left frontal convexity without associated mass effect or edema.   CT Cervical Spine without Contrast 04/03/2021: Moderate severity multilevel degenerative changes, most prominent at the levels of C4-C5, C5-C6 and C6-C7. No evidence of an acute fracture or subluxation.   COGNITION: Overall cognitive status: Within functional limits for tasks assessed   SENSATION: WFL  COORDINATION: WFL for basic functional mobility tasks  EDEMA:  None observed  MUSCLE TONE: WNL  MUSCLE LENGTH: Not tested  DTRs:  Not tested  POSTURE: rounded shoulders, forward head, and increased lumbar lordosis  LOWER EXTREMITY ROM:     Passive  All WFL for basic functional mobility tasks Right Eval Left Eval  Hip flexion    Hip extension    Hip abduction    Hip adduction    Hip internal rotation    Hip external rotation    Knee flexion    Knee extension    Ankle dorsiflexion    Ankle plantarflexion    Ankle inversion    Ankle eversion     (Blank rows = not tested)  LOWER EXTREMITY MMT:    MMT Right Eval Left Eval  Hip flexion 3+ 4  Hip extension    Hip abduction    Hip adduction    Hip  internal rotation    Hip external rotation    Knee flexion 4- 4-  Knee extension 4 4  Ankle dorsiflexion 4 4-  Ankle plantarflexion 4- 4-  Ankle inversion    Ankle eversion    (Blank rows = not tested)  Manual Muscle Test Scale 0/5 = No muscle contraction can be seen or felt 1/5 = Contraction can be felt, but there is no motion 2-/5 = Part moves through incomplete ROM w/ gravity decreased 2/5 = Part moves through complete ROM w/ gravity decreased 2+/5 = Part moves through incomplete ROM (<50%) against gravity or through complete ROM w/ gravity 3-/5 = Part moves through incomplete ROM (>50%) against gravity 3/5 = Part moves through complete ROM against gravity 3+/5 = Part moves through complete ROM against gravity/slight resistance 4-/5= Holds test position against slight to moderate pressure 4/5 = Part moves through complete ROM against gravity/moderate resistance 4+/5= Holds test position against moderate to strong pressure 5/5 = Part moves through complete ROM against gravity/full resistance  BED MOBILITY:  Sit to supine Min A and requires used of bedrail Supine to sit Min A and requires used of bedrail Rolling to Right Min A Rolling to Left Min A Relies on bedrial, difficulty with lateral scooting in the bed (or in sitting)  TRANSFERS: Assistive device utilized: Environmental consultant - 4 wheeled  Sit to stand: SBA and CGA Stand to sit: SBA and CGA Chair to chair: SBA and CGA Without AD requires more consistent CGA/light min A Floor:  not assessed  RAMP:  Assistive device utilized: Walker - 4 wheeled Ramp Comments: would benefit  from assessment of this when able due to steep driveway at home  CURB:   Curb Comments: Not assessed  STAIRS: Level of Assistance: CGA Stair Negotiation Technique: Step to Pattern with Bilateral Rails ascending with R LE and descending with L LE Number of Stairs: 4  Height of Stairs: 6in  Comments: Pt reports feeling more confident when having B HR  support  GAIT: Gait pattern: step through pattern, decreased step length- Right, decreased step length- Left, decreased stride length, shuffling, trendelenburg, lateral hip instability, lateral lean- Right, lateral lean- Left, wide BOS, poor foot clearance- Right, and poor foot clearance- Left --- R/L lateral trunk lean over stance limb, decreased step lengths bilaterally, partial shuffled gait, wide BOS, increased balance instability Distance walked: ~39ft using HHA progressed to no UE support Assistive device utilized:  HHA progressed to no UE support Level of assistance: CGA and Min A Comments: Slow gait speed  FUNCTIONAL TESTS:  5 times sit to stand: on 06/04/2023: 59.25 seconds Timed up and go (TUG): 24.90sec without AD, 22.25 with rollator 3 minute walk test: on 06/04/2023: 213ft using rollator = 80.75meters 10 meter walk test: 36.75 seconds = 0.76m/s without AD; 06/04/23 0.63m/s average gait speed during 3 minute walk test using rollator  Berg Balance Scale: on 06/04/2023 = 31/56 Would benefit from Modified CTSIB & FGA  PATIENT SURVEYS:  ABC scale 17.5%  Details: patient 0% confident with majority of items; 50% confident household level ambulation, 40% stairs, 50% sweeping floor, 50% getting in/out of car, and 90% confident ambulating up/down ramp with rails    Vestibular Assessment  - performed on 06/07/2023 - re-assessed on 2/25 with details in that treatment note  OCULOMOTOR EXAM:  Ocular Alignment: normal  Ocular ROM: No Limitations  Spontaneous Nystagmus: absent  Gaze-Induced Nystagmus: absent and continues to have difficulty keeping eyes on target, especially towards L  Smooth Pursuits:  difficulty keeping eyes on target  Saccades: hypometric/undershoots, extra eye movements, and difficulty finding target on L side  Convergence/Divergence: not assessed Cover/Cross Cover: WNL  VESTIBULAR - OCULAR REFLEX:   VOR Cancellation: Unable to Maintain Gaze, inconsistent ability to  keep eyes on target  Head-Impulse Test: HIT Right: negative HIT Left: negative -- had a difficult time keeping eyes on target in both directions  Dynamic Visual Acuity:  would benefit from future testing    POSITIONAL TESTING: Right Dix-Hallpike: upbeating, right nystagmus Left Dix-Hallpike: benefit from testing at next visit Right Roll Test: no nystagmus Left Roll Test: no nystagmus Left Sidelying: no nystagmus  MOTION SENSITIVITY:  May benefit from testing in future visits                                                                                                   TREATMENT DATE:   06/20/23   Gait training ~127ft using rollator with SBA for safety.  Unless otherwise stated, CGA/SBA was provided and gait belt donned in order to ensure pt safety throughout session.  Gait training 165ft, no UE support progressed to using R HHA progressed to using rollator, with CGA/light min assist for steadying. Pt  with increased confidence at first with more relaxed UB posturing, but quickly starts to fatigue due to not sleeping well last night. Patient continues to have min increased lateral postural sway, decreased step lengths bilaterally, and decreased gait speed.  Pt reports she is really tired today and is being hard on herself for not being able to walk this distance today due to fatigue - provided emotional support and motivational encouragement on her progress thus far.  Sit<>stands no UE support from EOM x10reps with SBA - good ability to power into standing except 1x towards end.  Dynamic stepping balance training via side stepping over PVC pipe with B HHA and min A for balance with pt having difficulty taking large enough step over to allow room for other foot x6 reps.   Pt requests to stop this intervention at this time due to fear of falling. Pt reports she just feels "shut down" today.   Participated in gait training ~111ft using rollator with SBA targeting gait  endurance.  Dynamic balance task of reaching to tap Blaze Pods sat on tables in 1/2 circle - set on random setting with 2 colors (red = R hand and blue = L hand) for cognitive dual-task - pt with no errors and CGA for steadying balance while stepping and turning.  Dynamic standing balance with 2 Blaze Pods on counter and 2 Blaze Pods on floor in front of pt with 2 colors (red = R and blue = L) challenging alternating between foot taps and hand taps to correct color with slight cross body reaching for foot taps - CGA/light min A for steadying. Pt with a few errors, primarily with incorrect foot taps.    Pt reports feeling much better at end of session.    PATIENT EDUCATION:  Education details: PT POC, balance impairments and increased fall risk based on outcome measures, HEP Person educated: Patient and Spouse Education method: Explanation Education comprehension: verbalized understanding and needs further education  HOME EXERCISE PROGRAM:  Access Code: [email or web address] URL: [email or web address] Date: 06/04/2023 Prepared by: Casimiro Needle  Exercises - Walking with Rollator - 1 x daily - 7 x weekly - 3-5 sets - 3 minutes hold - Sit to Stand with Hands on Knees  - 1 x daily - 7 x weekly - 2 sets - 10 reps - Standing March with Counter Support  - 1 x daily - 7 x weekly - 2 sets - 10 reps  GOALS: Goals reviewed with patient? Yes  SHORT TERM GOALS: Target date: 07/09/2023   Patient will be independent in home exercise program to improve strength/mobility for better functional independence with ADLs. Baseline: provided on 06/04/2023 Goal status: INITIAL  2. Patient will improve ABC scale by 20% indicating increased confidence performing daily mobility tasks, which is associated with fall risk.    Baseline: 05/28/2023 = 17.5%   Goal Status: INITIAL  LONG TERM GOALS: Target date: 08/20/2023   Patient (> 45 years old) will complete five times sit to stand test in < 15 seconds  indicating an increased LE strength and improved balance. Baseline: 06/04/2023: 59.25 seconds Goal status: INITIAL  Patient will increase Berg Balance score by > 6 points to demonstrate decreased fall risk during functional activities. Baseline: 06/04/2023: 31/56 Goal status: INITIAL   Patient will reduce timed up and go to <11 seconds to reduce fall risk and demonstrate improved transfer/gait ability. Baseline: 05/28/2023 = 22.25sec using rollator vs 24.90 seconds without AD Goal status: INITIAL  Patient will increase 10 meter  walk test to >0.47m/s as to improve gait speed for better community ambulation and to reduce fall risk. Baseline: 05/28/2023 = 0.35m/s Goal status: INITIAL  Patient will increase 3 minute walk test distance to >330ft for progression to limited community ambulation and improved gait ability Baseline: 06/04/2023 = 258ft (80.38meters) Goal status: INITIAL   ASSESSMENT:  CLINICAL IMPRESSION:  Patient is a 68 y.o. female who was seen today for physical therapy treatment for impaired balance and gait deficits due to Parkinsonism. Therapy session focused on gait training, dynamic balance, and B LE functional strengthening to decrease pt's risk for falls. Pt reports overall increased fatigue today due to not sleeping well last night. Patient continues to require CGA/light min A and/or UE support during dynamic stepping balance challenges. Pt continues to demo minor weakness in L LE compared to R when performing foot taps to [employer]. Pt will continue to benefit from B LE functional strengthening and dynamic stepping balance in variable directions with progression of this into dynamic gait training. Ms. Recht will benefit from continued skilled PT to improve these deficits in order to increase QOL and ease/safety with ADLs and functional mobility.  OBJECTIVE IMPAIRMENTS: Abnormal gait, cardiopulmonary status limiting activity, decreased activity tolerance, decreased balance,  decreased endurance, decreased mobility, difficulty walking, and decreased strength.   ACTIVITY LIMITATIONS: carrying, lifting, bending, standing, squatting, stairs, transfers, bed mobility, reach over head, and locomotion level  PARTICIPATION LIMITATIONS: meal prep, cleaning, laundry, shopping, and community activity  PERSONAL FACTORS: Age, Behavior pattern, Fitness, Time since onset of injury/illness/exacerbation, and 3+ comorbidities: Parkinsonism, anxiety, arthritis, depression, HTN  are also affecting patient's functional outcome.   REHAB POTENTIAL: Good  CLINICAL DECISION MAKING: Evolving/moderate complexity  EVALUATION COMPLEXITY: Moderate  PLAN:  PT FREQUENCY: 1-2x/week  PT DURATION: 12 weeks  PLANNED INTERVENTIONS: [id number]- PT Re-evaluation, 97110-Therapeutic exercises, 97530- Therapeutic activity, 97112- Neuromuscular re-education, 97535- Self Care, [id number]- Manual therapy, [phone]- Gait training, [phone]- Canalith repositioning, Patient/Family education, Balance training, Stair training, Vestibular training, Visual/preceptual remediation/compensation, Cognitive remediation, and DME instructions   PLAN FOR NEXT SESSION:  - dynamic balance and gait challenges with gaze stabilization dual-task challenges  - turning and stepping in different directions over small obstacles - B LE functional strengthening  - dynamic reaching with turning and stepping - lateral side stepping, backwards stepping in // bars    Ginny Forth, PT, DPT, NCS, CSRS Physical Therapist - [facility]  [facility]  5:44 PM 06/20/23

## 2023-06-24 ENCOUNTER — Other Ambulatory Visit: Payer: Self-pay | Admitting: Family Medicine

## 2023-06-24 DIAGNOSIS — F410 Panic disorder [episodic paroxysmal anxiety] without agoraphobia: Secondary | ICD-10-CM

## 2023-06-24 DIAGNOSIS — E785 Hyperlipidemia, unspecified: Secondary | ICD-10-CM

## 2023-06-25 ENCOUNTER — Ambulatory Visit: Payer: Medicare Other | Admitting: Physical Therapy

## 2023-06-25 ENCOUNTER — Encounter: Payer: Medicare Other | Admitting: Speech Pathology

## 2023-06-26 DIAGNOSIS — G20C Parkinsonism, unspecified: Secondary | ICD-10-CM | POA: Diagnosis not present

## 2023-06-26 DIAGNOSIS — R29898 Other symptoms and signs involving the musculoskeletal system: Secondary | ICD-10-CM | POA: Diagnosis not present

## 2023-06-28 ENCOUNTER — Encounter: Payer: Medicare Other | Admitting: Speech Pathology

## 2023-06-28 ENCOUNTER — Ambulatory Visit: Payer: Medicare Other | Admitting: Physical Therapy

## 2023-06-28 DIAGNOSIS — R278 Other lack of coordination: Secondary | ICD-10-CM | POA: Diagnosis not present

## 2023-06-28 DIAGNOSIS — R42 Dizziness and giddiness: Secondary | ICD-10-CM

## 2023-06-28 DIAGNOSIS — R296 Repeated falls: Secondary | ICD-10-CM | POA: Diagnosis not present

## 2023-06-28 DIAGNOSIS — M6281 Muscle weakness (generalized): Secondary | ICD-10-CM | POA: Diagnosis not present

## 2023-06-28 DIAGNOSIS — R262 Difficulty in walking, not elsewhere classified: Secondary | ICD-10-CM | POA: Diagnosis not present

## 2023-06-28 NOTE — Therapy (Signed)
 OUTPATIENT PHYSICAL THERAPY NEURO TREATMENT   Patient Name: Brittany Benton MRN: [id number] DOB:February 08, 1956, 68 y.o., female Today's Date: 06/28/2023   PCP: Malva Limes, MD REFERRING PROVIDER: Lonell Face, MD   END OF SESSION:    PT End of Session - 06/28/23 1018     Visit Number 7    Number of Visits 24    Date for PT Re-Evaluation 08/20/23    Authorization Type [employer] 2025 - auth [id number]/[id number] for 16 visits    PT Start Time 1021    PT Stop Time 1114    PT Time Calculation (min) 53 min    Equipment Utilized During Treatment Gait belt    Activity Tolerance Patient tolerated treatment well;No increased pain    Behavior During Therapy [facility] for tasks assessed/performed                   Past Medical History:  Diagnosis Date   Allergy 1989   Anemia    Anxiety    Arthritis    osteoarthritis -right hip   Cataract    Depression    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   GERD (gastroesophageal reflux disease)    Hernia, incisional    after renal surgery   History of chicken pox    Hyperlipidemia    Hypertension    Renal cell carcinoma (HCC)    s/p right nephrectomy 1994   Sleep apnea    Thyroid disease    Past Surgical History:  Procedure Laterality Date   APPENDECTOMY  1994   BIOPSY  11/07/2022   Procedure: BIOPSY;  Surgeon: Wyline Mood, MD;  Location: [facility] ENDOSCOPY;  Service: Gastroenterology;;   BREAST BIOPSY Right 1991   Negative   CHOLECYSTECTOMY  1994   COLONOSCOPY WITH PROPOFOL N/A 11/07/2022   Procedure: COLONOSCOPY WITH PROPOFOL;  Surgeon: Wyline Mood, MD;  Location: [facility] ENDOSCOPY;  Service: Gastroenterology;  Laterality: N/A;   ESOPHAGOGASTRODUODENOSCOPY (EGD) WITH PROPOFOL N/A 11/07/2022   Procedure: ESOPHAGOGASTRODUODENOSCOPY (EGD) WITH PROPOFOL;  Surgeon: Wyline Mood, MD;  Location: [facility] ENDOSCOPY;  Service: Gastroenterology;  Laterality: N/A;   JOINT REPLACEMENT  2019   LAPAROSCOPIC GASTRIC SLEEVE RESECTION  02/02/2016    Dr Alva Garnet at [facility]   NEPHRECTOMY  1994   Renal Cell Carcinoma   parotid gland removal  1990   also removed a tumor   POLYPECTOMY  11/07/2022   Procedure: POLYPECTOMY INTESTINAL;  Surgeon: Wyline Mood, MD;  Location: [facility] ENDOSCOPY;  Service: Gastroenterology;;   TONSILLECTOMY     TOTAL HIP ARTHROPLASTY Right 08/08/2016   Procedure: TOTAL HIP ARTHROPLASTY;  Surgeon: Deeann Saint, MD;  Location: [facility] ORS;  Service: Orthopedics;  Laterality: Right;   TUBAL LIGATION  1979   Patient Active Problem List   Diagnosis Date Noted   B12 deficiency 06/11/2022   Iron deficiency anemia 04/10/2022   Morbid obesity (HCC) 03/30/2022   Vitamin A deficiency 08/15/2021   Parkinson's disease (HCC) 08/03/2021   Acute urinary retention 08/03/2021   Meningioma (HCC) 04/05/2021   Hypothyroidism 08/15/2020   Osteopenia 06/14/2020   Status post total hip replacement, 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   Osteoarthritis of right hip 03/26/2016   H/O gastric bypass 03/26/2016   Sinus tachycardia 08/01/2015   Obesity (BMI 30-39.9) 06/08/2015   OSA (obstructive sleep apnea) 06/08/2015   GERD without esophagitis 06/08/2015   Incisional hernia 06/08/2015   Arthritis of hip 03/23/2015   Left knee pain 02/15/2015   Vitamin D deficiency 09/29/2014   Lump of right breast  09/22/2014   Prediabetes 09/22/2014   Pruritic dermatitis 09/22/2014   Obstructive sleep apnea 09/22/2014   Allergic rhinitis 09/15/2009   Hypersomnia 09/14/2009   Panic disorder 08/06/2008   Essential (primary) hypertension 07/16/2007   Hyperlipidemia 07/16/2007    ONSET DATE: Approximately 2 years ago  REFERRING DIAG: R26.89 (ICD-10-CM) - Balance problem   THERAPY DIAG:  Muscle weakness (generalized)  Repeated falls  Difficulty in walking, not elsewhere classified  Other lack of coordination  Dizziness and giddiness  Rationale for Evaluation and Treatment: Rehabilitation  SUBJECTIVE:                                                                                                                                                                                              SUBJECTIVE STATEMENT:  Pt reports she had a really bad fall on Tuesday morning while she was getting ready. Pt reports she thinks she was looking down and this caused her to become dizzy/feeling like motion, lasting only a few seconds. Pt reports she hit the L side of her head when she fell. Pt reports this has made her feel very fearful of falling with all activities. States she also gets dizzy when going to stand.  Pt reports overall she feels like she has regressed because she is having a hard time getting her words out. Pt requests to re-consult for SLP services.  Pt had MD visit on Wednesday 3/12 with Dr. Sherryll Burger (day after her fall) and her medications were adjusted with Sinemet added. Pt rpeorts she hasn't picked it up yet.    Pt accompanied by: significant other, Brittany Benton  PERTINENT HISTORY: Pt is a 68y.o. female with concern for Parkinsonism due to resting tremors (R>L) with occasional whole-body jerks/myoclonus, bradykinesia, hallucinations while waking up, bilateral bradykinesia (L>R), minimal cogwheeling bilaterally, sensation of stiffness, speech changes, and imbalance (trialed Carbidopa-Levodopa, but intolerable due to side effects of severe nausea & vomiting). Pt with hx of frequent falls and bilateral LE weakness. Pt reports hx of kidney removal on R side with large abdominal incision and R hip replacement.  PAIN:  Are you having pain? No  PRECAUTIONS: Fall  RED FLAGS: None   WEIGHT BEARING RESTRICTIONS: No  FALLS: Has patient fallen in last 6 months? Yes. Number of falls 5-6 (2 in driveway, 1 on sidewalk)  Most recent fall last Thursday 05/23/2023, pt reports she became "disoriented" and had LOB, but no injuries- pt states in general she is "very easily distracted and then she will misstep causing her to have LOB"  LIVING ENVIRONMENT: Lives with:  lives with their spouse [name]) Lives in: House/apartment Stairs: Yes:  Internal: reports she doesn't have to go to different floors in house (4 steps inside); and External: 3-4 steps to enter with R HRs + courtyard + additional 4-5 steps onto porch with B HRs -  entrance has steep driveway Has following equipment at home: Dan Humphreys - 4 wheeled, shower bench, grabbars in bathroom, bedrail, and Ramped entry from garage (but has to go up steep driveway)  PLOF: Independent with basic ADLs, Independent with household mobility with device, Independent with household mobility without device, and Requires assistive device for independence Reports started using rollator more consistently ~2-3weeks ago but has used it on/off for a while  PATIENT GOALS: Wants to feel comfortable moving around and be shown how to do that again  OBJECTIVE:  Note: Objective measures were completed at Evaluation unless otherwise noted.  DIAGNOSTIC FINDINGS:   CT Head without Contrast 04/03/2021: Chronic atrophic changes without acute abnormality.   MRI Brain without Contrast 04/04/2021: 1. No acute intracranial abnormality. 2. Mild age-related cerebral atrophy with chronic small vessel ischemic disease. 3. 1.1 cm meningioma overlying the left frontal convexity without associated mass effect or edema.   CT Cervical Spine without Contrast 04/03/2021: Moderate severity multilevel degenerative changes, most prominent at the levels of C4-C5, C5-C6 and C6-C7. No evidence of an acute fracture or subluxation.   COGNITION: Overall cognitive status: Within functional limits for tasks assessed   SENSATION: WFL  COORDINATION: WFL for basic functional mobility tasks  EDEMA:  None observed  MUSCLE TONE: WNL  MUSCLE LENGTH: Not tested  DTRs:  Not tested  POSTURE: rounded shoulders, forward head, and increased lumbar lordosis  LOWER EXTREMITY ROM:     Passive  All WFL for basic functional mobility tasks Right Eval  Left Eval  Hip flexion    Hip extension    Hip abduction    Hip adduction    Hip internal rotation    Hip external rotation    Knee flexion    Knee extension    Ankle dorsiflexion    Ankle plantarflexion    Ankle inversion    Ankle eversion     (Blank rows = not tested)  LOWER EXTREMITY MMT:    MMT Right Eval Left Eval  Hip flexion 3+ 4  Hip extension    Hip abduction    Hip adduction    Hip internal rotation    Hip external rotation    Knee flexion 4- 4-  Knee extension 4 4  Ankle dorsiflexion 4 4-  Ankle plantarflexion 4- 4-  Ankle inversion    Ankle eversion    (Blank rows = not tested)  Manual Muscle Test Scale 0/5 = No muscle contraction can be seen or felt 1/5 = Contraction can be felt, but there is no motion 2-/5 = Part moves through incomplete ROM w/ gravity decreased 2/5 = Part moves through complete ROM w/ gravity decreased 2+/5 = Part moves through incomplete ROM (<50%) against gravity or through complete ROM w/ gravity 3-/5 = Part moves through incomplete ROM (>50%) against gravity 3/5 = Part moves through complete ROM against gravity 3+/5 = Part moves through complete ROM against gravity/slight resistance 4-/5= Holds test position against slight to moderate pressure 4/5 = Part moves through complete ROM against gravity/moderate resistance 4+/5= Holds test position against moderate to strong pressure 5/5 = Part moves through complete ROM against gravity/full resistance  BED MOBILITY:  Sit to supine Min A and requires used of bedrail Supine to sit Min A and requires used of bedrail  Rolling to Right Min A Rolling to Left Min A Relies on bedrial, difficulty with lateral scooting in the bed (or in sitting)  TRANSFERS: Assistive device utilized: Environmental consultant - 4 wheeled  Sit to stand: SBA and CGA Stand to sit: SBA and CGA Chair to chair: SBA and CGA Without AD requires more consistent CGA/light min A Floor:  not assessed  RAMP:  Assistive device  utilized: Environmental consultant - 4 wheeled Ramp Comments: would benefit from assessment of this when able due to steep driveway at home  CURB:   Curb Comments: Not assessed  STAIRS: Level of Assistance: CGA Stair Negotiation Technique: Step to Pattern with Bilateral Rails ascending with R LE and descending with L LE Number of Stairs: 4  Height of Stairs: 6in  Comments: Pt reports feeling more confident when having B HR support  GAIT: Gait pattern: step through pattern, decreased step length- Right, decreased step length- Left, decreased stride length, shuffling, trendelenburg, lateral hip instability, lateral lean- Right, lateral lean- Left, wide BOS, poor foot clearance- Right, and poor foot clearance- Left --- R/L lateral trunk lean over stance limb, decreased step lengths bilaterally, partial shuffled gait, wide BOS, increased balance instability Distance walked: ~66ft using HHA progressed to no UE support Assistive device utilized:  HHA progressed to no UE support Level of assistance: CGA and Min A Comments: Slow gait speed  FUNCTIONAL TESTS:  5 times sit to stand: on 06/04/2023: 59.25 seconds Timed up and go (TUG): 24.90sec without AD, 22.25 with rollator 3 minute walk test: on 06/04/2023: 216ft using rollator = 80.51meters 10 meter walk test: 36.75 seconds = 0.38m/s without AD; 06/04/23 0.47m/s average gait speed during 3 minute walk test using rollator  Berg Balance Scale: on 06/04/2023 = 31/56 Would benefit from Modified CTSIB & FGA  PATIENT SURVEYS:  ABC scale 17.5%  Details: patient 0% confident with majority of items; 50% confident household level ambulation, 40% stairs, 50% sweeping floor, 50% getting in/out of car, and 90% confident ambulating up/down ramp with rails    Vestibular Assessment  - performed on 06/07/2023 - re-assessed on 2/25 with details in that treatment note  OCULOMOTOR EXAM:  Ocular Alignment: normal  Ocular ROM: No Limitations  Spontaneous Nystagmus:  absent  Gaze-Induced Nystagmus: absent and continues to have difficulty keeping eyes on target, especially towards L  Smooth Pursuits:  difficulty keeping eyes on target  Saccades: hypometric/undershoots, extra eye movements, and difficulty finding target on L side  Convergence/Divergence: not assessed Cover/Cross Cover: WNL  VESTIBULAR - OCULAR REFLEX:   VOR Cancellation: Unable to Maintain Gaze, inconsistent ability to keep eyes on target  Head-Impulse Test: HIT Right: negative HIT Left: negative -- had a difficult time keeping eyes on target in both directions  Dynamic Visual Acuity:  would benefit from future testing    POSITIONAL TESTING: Right Dix-Hallpike: upbeating, right nystagmus Left Dix-Hallpike: benefit from testing at next visit Right Roll Test: no nystagmus Left Roll Test: no nystagmus Left Sidelying: no nystagmus  MOTION SENSITIVITY:  May benefit from testing in future visits  TREATMENT DATE:   06/28/23   Pt educated on calling 911 to receive EMS assessment after a fall and for assistance performing floor transfer.   Pt reports even looking down last night would cause her to become dizzy.  Vitals in sitting to start session: BP 127/72 (MAP 89), HR 79bpm  Vitals in standing: BP 124/79 (MAP 93), HR 86bpm  No symptoms of lightheadedness and vitals stable.  Vestibular Assessment  OCULOMOTOR EXAM:  Ocular Alignment: normal  Ocular ROM: No Limitations  Spontaneous Nystagmus: absent  VESTIBULAR - OCULAR REFLEX:   VOR Cancellation: Normal  Head-Impulse Test: HIT Right: pt with difficult relaxing to allow therapist to perform quick rotation and closes eyes startled during it HIT Left: pt with difficult relaxing to allow therapist to perform quick rotation and closes eyes startled during it  POSITIONAL TESTING: Loaded Right Dix-Hallpike: positive fast upbeating, right  nystagmus, with significant symptoms, and Duration: 15seconds  Performed RIGHT Canalith Repositioning Maneuver (CRM) x1 with pt having some fear of movement during  Repeated Loaded RIGHT Dix-Hallpike test: negative for obvious nystagmus in room light; however, pt still reporting minor symptoms  Repeated RIGHT CRM x1  Loaded Left Dix-Hallpike: negative: no nystagmus and no symptoms but pt still reports feeling slightly wobbly when coming back up to sitting   Pt reports feeling much better at end of session. Stating even her vision seems "more clear."   PATIENT EDUCATION:  Education details: PT POC, balance impairments and increased fall risk based on outcome measures, HEP Person educated: Patient and Spouse Education method: Explanation Education comprehension: verbalized understanding and needs further education  HOME EXERCISE PROGRAM:  Access Code: [email or web address] URL: [email or web address] Date: 06/04/2023 Prepared by: Casimiro Needle  Exercises - Walking with Rollator - 1 x daily - 7 x weekly - 3-5 sets - 3 minutes hold - Sit to Stand with Hands on Knees  - 1 x daily - 7 x weekly - 2 sets - 10 reps - Standing March with Counter Support  - 1 x daily - 7 x weekly - 2 sets - 10 reps  GOALS: Goals reviewed with patient? Yes  SHORT TERM GOALS: Target date: 07/09/2023   Patient will be independent in home exercise program to improve strength/mobility for better functional independence with ADLs. Baseline: provided on 06/04/2023 Goal status: INITIAL  2. Patient will improve ABC scale by 20% indicating increased confidence performing daily mobility tasks, which is associated with fall risk.    Baseline: 05/28/2023 = 17.5%   Goal Status: INITIAL  LONG TERM GOALS: Target date: 08/20/2023   Patient (> 77 years old) will complete five times sit to stand test in < 15 seconds indicating an increased LE strength and improved balance. Baseline: 06/04/2023: 59.25 seconds Goal  status: INITIAL  Patient will increase Berg Balance score by > 6 points to demonstrate decreased fall risk during functional activities. Baseline: 06/04/2023: 31/56 Goal status: INITIAL   Patient will reduce timed up and go to <11 seconds to reduce fall risk and demonstrate improved transfer/gait ability. Baseline: 05/28/2023 = 22.25sec using rollator vs 24.90 seconds without AD Goal status: INITIAL  Patient will increase 10 meter walk test to >0.16m/s as to improve gait speed for better community ambulation and to reduce fall risk. Baseline: 05/28/2023 = 0.33m/s Goal status: INITIAL  Patient will increase 3 minute walk test distance to >325ft for progression to limited community ambulation and improved gait ability Baseline: 06/04/2023 = 291ft (80.90meters) Goal status: INITIAL   ASSESSMENT:  CLINICAL IMPRESSION:  Patient is a 68 y.o. female who was seen today for physical therapy treatment for impaired balance, dizziness, and gait deficits due to Parkinsonism and likely BPPV. Patient reports having a fall 3 days prior. States having feelings of vertigo again prior to the fall and reports she would experience dizziness when looking down. Performed repeat vestibular assessment with pt having positive R Loaded Dix-Hallpike so performed Canalith Repositioning Maneuver with improvement in symptoms and nystagmus during repeat testing. Ms. Degraffenreid will benefit from continued skilled PT to improve these deficits in order to increase QOL,ease/safety with ADLs, decrease fall risk, and increase independence with functional mobility at household and community level.  OBJECTIVE IMPAIRMENTS: Abnormal gait, cardiopulmonary status limiting activity, decreased activity tolerance, decreased balance, decreased endurance, decreased mobility, difficulty walking, and decreased strength.   ACTIVITY LIMITATIONS: carrying, lifting, bending, standing, squatting, stairs, transfers, bed mobility, reach over head, and  locomotion level  PARTICIPATION LIMITATIONS: meal prep, cleaning, laundry, shopping, and community activity  PERSONAL FACTORS: Age, Behavior pattern, Fitness, Time since onset of injury/illness/exacerbation, and 3+ comorbidities: Parkinsonism, anxiety, arthritis, depression, HTN  are also affecting patient's functional outcome.   REHAB POTENTIAL: Good  CLINICAL DECISION MAKING: Evolving/moderate complexity  EVALUATION COMPLEXITY: Moderate  PLAN:  PT FREQUENCY: 1-2x/week  PT DURATION: 12 weeks  PLANNED INTERVENTIONS: 97164- PT Re-evaluation, 97110-Therapeutic exercises, 97530- Therapeutic activity, 97112- Neuromuscular re-education, 97535- Self Care, 16109- Manual therapy, [phone]- Gait training, [phone]- Canalith repositioning, Patient/Family education, Balance training, Stair training, Vestibular training, Visual/preceptual remediation/compensation, Cognitive remediation, and DME instructions   PLAN FOR NEXT SESSION:  - follow-up on vestibular treatment from 06/28/2023 - B LE functional strengthening   - adding weights/resistance training as appropriate  - dynamic balance and gait challenges with gaze stabilization dual-task challenges  - turning and stepping in different directions over small obstacles - dynamic reaching with turning and stepping - lateral side stepping, backwards stepping in // bars    Ginny Forth, PT, DPT, NCS, CSRS Physical Therapist - [facility]  [facility]  11:34 AM 06/28/23

## 2023-07-02 ENCOUNTER — Encounter: Payer: Medicare Other | Admitting: Speech Pathology

## 2023-07-02 ENCOUNTER — Ambulatory Visit: Payer: Medicare Other | Admitting: Physical Therapy

## 2023-07-02 DIAGNOSIS — R262 Difficulty in walking, not elsewhere classified: Secondary | ICD-10-CM

## 2023-07-02 DIAGNOSIS — R278 Other lack of coordination: Secondary | ICD-10-CM

## 2023-07-02 DIAGNOSIS — R42 Dizziness and giddiness: Secondary | ICD-10-CM | POA: Diagnosis not present

## 2023-07-02 DIAGNOSIS — R296 Repeated falls: Secondary | ICD-10-CM

## 2023-07-02 DIAGNOSIS — M6281 Muscle weakness (generalized): Secondary | ICD-10-CM | POA: Diagnosis not present

## 2023-07-02 NOTE — Therapy (Signed)
 OUTPATIENT PHYSICAL THERAPY NEURO TREATMENT   Patient Name: Brittany Benton MRN: [id number] DOB:12-31-1955, 68 y.o., female Today's Date: 07/02/2023   PCP: Brittany Limes, MD REFERRING PROVIDER: Lonell Face, MD   END OF SESSION:    PT End of Session - 07/02/23 1620     Visit Number 8    Number of Visits 24    Date for PT Re-Evaluation 08/20/23    Authorization Type [employer] 2025 - auth [id number]/[id number] for 16 visits    PT Start Time 1620    PT Stop Time 1706    PT Time Calculation (min) 46 min    Equipment Utilized During Treatment Gait belt    Activity Tolerance Patient tolerated treatment well;No increased pain    Behavior During Therapy [facility] for tasks assessed/performed                    Past Medical History:  Diagnosis Date   Allergy 1989   Anemia    Anxiety    Arthritis    osteoarthritis -right hip   Cataract    Depression    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   GERD (gastroesophageal reflux disease)    Hernia, incisional    after renal surgery   History of chicken pox    Hyperlipidemia    Hypertension    Renal cell carcinoma (HCC)    s/p right nephrectomy 1994   Sleep apnea    Thyroid disease    Past Surgical History:  Procedure Laterality Date   APPENDECTOMY  1994   BIOPSY  11/07/2022   Procedure: BIOPSY;  Surgeon: Brittany Mood, MD;  Location: [facility] ENDOSCOPY;  Service: Gastroenterology;;   BREAST BIOPSY Right 1991   Negative   CHOLECYSTECTOMY  1994   COLONOSCOPY WITH PROPOFOL N/A 11/07/2022   Procedure: COLONOSCOPY WITH PROPOFOL;  Surgeon: Brittany Mood, MD;  Location: [facility] ENDOSCOPY;  Service: Gastroenterology;  Laterality: N/A;   ESOPHAGOGASTRODUODENOSCOPY (EGD) WITH PROPOFOL N/A 11/07/2022   Procedure: ESOPHAGOGASTRODUODENOSCOPY (EGD) WITH PROPOFOL;  Surgeon: Brittany Mood, MD;  Location: [facility] ENDOSCOPY;  Service: Gastroenterology;  Laterality: N/A;   JOINT REPLACEMENT  2019   LAPAROSCOPIC GASTRIC SLEEVE RESECTION  02/02/2016    Dr Brittany Benton at [facility]   NEPHRECTOMY  1994   Renal Cell Carcinoma   parotid gland removal  1990   also removed a tumor   POLYPECTOMY  11/07/2022   Procedure: POLYPECTOMY INTESTINAL;  Surgeon: Brittany Mood, MD;  Location: [facility] ENDOSCOPY;  Service: Gastroenterology;;   TONSILLECTOMY     TOTAL HIP ARTHROPLASTY Right 08/08/2016   Procedure: TOTAL HIP ARTHROPLASTY;  Surgeon: Brittany Saint, MD;  Location: [facility] ORS;  Service: Orthopedics;  Laterality: Right;   TUBAL LIGATION  1979   Patient Active Problem List   Diagnosis Date Noted   B12 deficiency 06/11/2022   Iron deficiency anemia 04/10/2022   Morbid obesity (HCC) 03/30/2022   Vitamin A deficiency 08/15/2021   Parkinson's disease (HCC) 08/03/2021   Acute urinary retention 08/03/2021   Meningioma (HCC) 04/05/2021   Hypothyroidism 08/15/2020   Osteopenia 06/14/2020   Status post total hip replacement, 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   Osteoarthritis of right hip 03/26/2016   H/O gastric bypass 03/26/2016   Sinus tachycardia 08/01/2015   Obesity (BMI 30-39.9) 06/08/2015   OSA (obstructive sleep apnea) 06/08/2015   GERD without esophagitis 06/08/2015   Incisional hernia 06/08/2015   Arthritis of hip 03/23/2015   Left knee pain 02/15/2015   Vitamin D deficiency 09/29/2014   Lump of right  breast 09/22/2014   Prediabetes 09/22/2014   Pruritic dermatitis 09/22/2014   Obstructive sleep apnea 09/22/2014   Allergic rhinitis 09/15/2009   Hypersomnia 09/14/2009   Panic disorder 08/06/2008   Essential (primary) hypertension 07/16/2007   Hyperlipidemia 07/16/2007    ONSET DATE: Approximately 2 years ago  REFERRING DIAG: R26.89 (ICD-10-CM) - Balance problem   THERAPY DIAG:  Muscle weakness (generalized)  Repeated falls  Other lack of coordination  Dizziness and giddiness  Difficulty in walking, not elsewhere classified  Rationale for Evaluation and Treatment: Rehabilitation  SUBJECTIVE:                                                                                                                                                                                              SUBJECTIVE STATEMENT:  Pt reports she is doing "great" today. Pt reports her dizziness is "still fixed" with no symptoms of vertigo since last session. Pt reports that is 99% of why she is feeling better. Pt reports she started taking the Sinemet recommended by Dr. Sherryll Benton and states she feels "so much clearer" in the head and isn't "fumbling as much" with her words since taking it.  Pt accompanied by: significant other, Brittany Benton  PERTINENT HISTORY: Pt is a 68y.o. female with concern for Parkinsonism due to resting tremors (R>L) with occasional whole-body jerks/myoclonus, bradykinesia, hallucinations while waking up, bilateral bradykinesia (L>R), minimal cogwheeling bilaterally, sensation of stiffness, speech changes, and imbalance (trialed Carbidopa-Levodopa, but intolerable due to side effects of severe nausea & vomiting). Pt with hx of frequent falls and bilateral LE weakness. Pt reports hx of kidney removal on R side with large abdominal incision and R hip replacement.  PAIN:  Are you having pain? No  PRECAUTIONS: Fall  RED FLAGS: None   WEIGHT BEARING RESTRICTIONS: No  FALLS: Has patient fallen in last 6 months? Yes. Number of falls 5-6 (2 in driveway, 1 on sidewalk)  Most recent fall last Thursday 05/23/2023, pt reports she became "disoriented" and had LOB, but no injuries- pt states in general she is "very easily distracted and then she will misstep causing her to have LOB"  LIVING ENVIRONMENT: Lives with: lives with their spouse Brittany Benton) Lives in: House/apartment Stairs: Yes: Internal: reports she doesn't have to go to different floors in house (4 steps inside); and External: 3-4 steps to enter with R HRs + courtyard + additional 4-5 steps onto porch with B HRs -  entrance has steep driveway Has following equipment at home: Dan Humphreys - 4 wheeled, shower bench,  grabbars in bathroom, bedrail, and Ramped entry from garage (but has to go up steep driveway)  PLOF: Independent with basic ADLs, Independent with household mobility with device, Independent with household mobility without device, and Requires assistive device for independence Reports started using rollator more consistently ~2-3weeks ago but has used it on/off for a while  PATIENT GOALS: Wants to feel comfortable moving around and be shown how to do that again  OBJECTIVE:  Note: Objective measures were completed at Evaluation unless otherwise noted.  DIAGNOSTIC FINDINGS:   CT Head without Contrast 04/03/2021: Chronic atrophic changes without acute abnormality.   MRI Brain without Contrast 04/04/2021: 1. No acute intracranial abnormality. 2. Mild age-related cerebral atrophy with chronic small vessel ischemic disease. 3. 1.1 cm meningioma overlying the left frontal convexity without associated mass effect or edema.   CT Cervical Spine without Contrast 04/03/2021: Moderate severity multilevel degenerative changes, most prominent at the levels of C4-C5, C5-C6 and C6-C7. No evidence of an acute fracture or subluxation.   COGNITION: Overall cognitive status: Within functional limits for tasks assessed   SENSATION: WFL  COORDINATION: WFL for basic functional mobility tasks  EDEMA:  None observed  MUSCLE TONE: WNL  MUSCLE LENGTH: Not tested  DTRs:  Not tested  POSTURE: rounded shoulders, forward head, and increased lumbar lordosis  LOWER EXTREMITY ROM:     Passive  All WFL for basic functional mobility tasks Right Eval Left Eval  Hip flexion    Hip extension    Hip abduction    Hip adduction    Hip internal rotation    Hip external rotation    Knee flexion    Knee extension    Ankle dorsiflexion    Ankle plantarflexion    Ankle inversion    Ankle eversion     (Blank rows = not tested)  LOWER EXTREMITY MMT:    MMT Right Eval Left Eval  Hip flexion 3+ 4  Hip  extension    Hip abduction    Hip adduction    Hip internal rotation    Hip external rotation    Knee flexion 4- 4-  Knee extension 4 4  Ankle dorsiflexion 4 4-  Ankle plantarflexion 4- 4-  Ankle inversion    Ankle eversion    (Blank rows = not tested)  Manual Muscle Test Scale 0/5 = No muscle contraction can be seen or felt 1/5 = Contraction can be felt, but there is no motion 2-/5 = Part moves through incomplete ROM w/ gravity decreased 2/5 = Part moves through complete ROM w/ gravity decreased 2+/5 = Part moves through incomplete ROM (<50%) against gravity or through complete ROM w/ gravity 3-/5 = Part moves through incomplete ROM (>50%) against gravity 3/5 = Part moves through complete ROM against gravity 3+/5 = Part moves through complete ROM against gravity/slight resistance 4-/5= Holds test position against slight to moderate pressure 4/5 = Part moves through complete ROM against gravity/moderate resistance 4+/5= Holds test position against moderate to strong pressure 5/5 = Part moves through complete ROM against gravity/full resistance  BED MOBILITY:  Sit to supine Min A and requires used of bedrail Supine to sit Min A and requires used of bedrail Rolling to Right Min A Rolling to Left Min A Relies on bedrial, difficulty with lateral scooting in the bed (or in sitting)  TRANSFERS: Assistive device utilized: Environmental consultant - 4 wheeled  Sit to stand: SBA and CGA Stand to sit: SBA and CGA Chair to chair: SBA and CGA Without AD requires more consistent CGA/light min A Floor:  not assessed  RAMP:  Assistive device utilized:  Walker - 4 wheeled Ramp Comments: would benefit from assessment of this when able due to steep driveway at home  CURB:   Curb Comments: Not assessed  STAIRS: Level of Assistance: CGA Stair Negotiation Technique: Step to Pattern with Bilateral Rails ascending with R LE and descending with L LE Number of Stairs: 4  Height of Stairs: 6in  Comments:  Pt reports feeling more confident when having B HR support  GAIT: Gait pattern: step through pattern, decreased step length- Right, decreased step length- Left, decreased stride length, shuffling, trendelenburg, lateral hip instability, lateral lean- Right, lateral lean- Left, wide BOS, poor foot clearance- Right, and poor foot clearance- Left --- R/L lateral trunk lean over stance limb, decreased step lengths bilaterally, partial shuffled gait, wide BOS, increased balance instability Distance walked: ~53ft using HHA progressed to no UE support Assistive device utilized:  HHA progressed to no UE support Level of assistance: CGA and Min A Comments: Slow gait speed  FUNCTIONAL TESTS:  5 times sit to stand: on 06/04/2023: 59.25 seconds Timed up and go (TUG): 24.90sec without AD, 22.25 with rollator 3 minute walk test: on 06/04/2023: 257ft using rollator = 80.58meters 10 meter walk test: 36.75 seconds = 0.31m/s without AD; 06/04/23 0.96m/s average gait speed during 3 minute walk test using rollator  Berg Balance Scale: on 06/04/2023 = 31/56 Would benefit from Modified CTSIB & FGA  PATIENT SURVEYS:  ABC scale 17.5%  Details: patient 0% confident with majority of items; 50% confident household level ambulation, 40% stairs, 50% sweeping floor, 50% getting in/out of car, and 90% confident ambulating up/down ramp with rails    Vestibular Assessment  - performed on 06/07/2023 - re-assessed on 2/25 with details in that treatment note  OCULOMOTOR EXAM:  Ocular Alignment: normal  Ocular ROM: No Limitations  Spontaneous Nystagmus: absent  Gaze-Induced Nystagmus: absent and continues to have difficulty keeping eyes on target, especially towards L  Smooth Pursuits:  difficulty keeping eyes on target  Saccades: hypometric/undershoots, extra eye movements, and difficulty finding target on L side  Convergence/Divergence: not assessed Cover/Cross Cover: WNL  VESTIBULAR - OCULAR REFLEX:   VOR Cancellation:  Unable to Maintain Gaze, inconsistent ability to keep eyes on target  Head-Impulse Test: HIT Right: negative HIT Left: negative -- had a difficult time keeping eyes on target in both directions  Dynamic Visual Acuity:  would benefit from future testing    POSITIONAL TESTING: Right Dix-Hallpike: upbeating, right nystagmus Left Dix-Hallpike: benefit from testing at next visit Right Roll Test: no nystagmus Left Roll Test: no nystagmus Left Sidelying: no nystagmus  MOTION SENSITIVITY:  May benefit from testing in future visits                                                                                                   TREATMENT DATE:   07/02/23   Based on pt's symptoms do not feel further vestibular testing and treatment is warranted at this time.   Pt continues to report very poor endurance with limited walking distance.   B UE and B LE reciprocal movement pattern endurance and strength  training using Nustep against level 2 resistance increased to level 3 resistance after 3 minutes - totaling 6 minutes and 252 steps. Pt able to keep steps per minute (SPM) >40 throughout. Pt with windswept position of LB on Nustep requiring repeated cuing to keep R hip abducted to avoid bumping knee on machine.  Gait training ~16ft x2 to/from Nustep, no AD, with CGA/light min A for balance - continues to demo slow gait speed, high guard upper body posturing, wider BOS, and short step lengths.  B LE strength training including:  - repeated sit<>stands from EOM holding 3lb weights in each hand 2x 6 reps - therapist providing motivational cuing to increase muscle recruitment and powering up to stand - pt does have to push L hand on knee frequently to improve ability to rise to stand  - repeated step-ups onto green step using LHHA x10reps with R LE leading, but only x4 reps leading with L LE due to increased hip weakness on that side - supine bridge 2x10reps added level 2 red theraband resistance around  knees for 2nd set but this was too challenging - pt continues to have windswept LB posturing towards the L with R hip adducted - therapist providing cuing and facilitation for improved alignment  Added bridges to HEP and provided pt with updated printout.  Pt reports she feels "invigorated" after doing the strengthening exercises today.   PATIENT EDUCATION:  Education details: PT POC, balance impairments and increased fall risk based on outcome measures, HEP Person educated: Patient and Spouse Education method: Explanation Education comprehension: verbalized understanding and needs further education  HOME EXERCISE PROGRAM:  Access Code: [email or web address] URL: [email or web address] Date: 06/04/2023 Prepared by: Casimiro Needle  Exercises - Walking with Rollator - 1 x daily - 7 x weekly - 3-5 sets - 3 minutes hold - Sit to Stand with Hands on Knees  - 1 x daily - 7 x weekly - 2 sets - 10 reps - Standing March with Counter Support  - 1 x daily - 7 x weekly - 2 sets - 10 reps  GOALS: Goals reviewed with patient? Yes  SHORT TERM GOALS: Target date: 07/09/2023   Patient will be independent in home exercise program to improve strength/mobility for better functional independence with ADLs. Baseline: provided on 06/04/2023 Goal status: INITIAL  2. Patient will improve ABC scale by 20% indicating increased confidence performing daily mobility tasks, which is associated with fall risk.    Baseline: 05/28/2023 = 17.5%   Goal Status: INITIAL  LONG TERM GOALS: Target date: 08/20/2023   Patient (> 47 years old) will complete five times sit to stand test in < 15 seconds indicating an increased LE strength and improved balance. Baseline: 06/04/2023: 59.25 seconds Goal status: INITIAL  Patient will increase Berg Balance score by > 6 points to demonstrate decreased fall risk during functional activities. Baseline: 06/04/2023: 31/56 Goal status: INITIAL   Patient will reduce timed up and go  to <11 seconds to reduce fall risk and demonstrate improved transfer/gait ability. Baseline: 05/28/2023 = 22.25sec using rollator vs 24.90 seconds without AD Goal status: INITIAL  Patient will increase 10 meter walk test to >0.19m/s as to improve gait speed for better community ambulation and to reduce fall risk. Baseline: 05/28/2023 = 0.30m/s Goal status: INITIAL  Patient will increase 3 minute walk test distance to >332ft for progression to limited community ambulation and improved gait ability Baseline: 06/04/2023 = 248ft (80.88meters) Goal status: INITIAL   ASSESSMENT:  CLINICAL IMPRESSION:  Patient is  a 68 y.o. female who was seen today for physical therapy treatment for impaired balance, dizziness, functional strength deficits, and gait deficits due to Parkinsonism and likely BPPV. Pt reports no symptoms of vertigo since last therapy session and reports feeling significantly better overall after that treatment. Therapy session focused on progressive B LE strengthening targeting hip/glutes and quad strengthening. Patient progressed to weighted sit<>stands for increased challenge and supine glute bridges for more targeted strengthening. Throughout session pt demonstrates a windswept LB posturing towards L with R hip adducted. Pt reports really enjoying the exercises and that they make her feel stronger. Ms. Kidney will benefit from continued skilled PT to improve these deficits in order to increase QOL, ease/safety with ADLs, decrease fall risk, and increase independence with functional mobility at household and community level.  OBJECTIVE IMPAIRMENTS: Abnormal gait, cardiopulmonary status limiting activity, decreased activity tolerance, decreased balance, decreased endurance, decreased mobility, difficulty walking, and decreased strength.   ACTIVITY LIMITATIONS: carrying, lifting, bending, standing, squatting, stairs, transfers, bed mobility, reach over head, and locomotion level  PARTICIPATION  LIMITATIONS: meal prep, cleaning, laundry, shopping, and community activity  PERSONAL FACTORS: Age, Behavior pattern, Fitness, Time since onset of injury/illness/exacerbation, and 3+ comorbidities: Parkinsonism, anxiety, arthritis, depression, HTN  are also affecting patient's functional outcome.   REHAB POTENTIAL: Good  CLINICAL DECISION MAKING: Evolving/moderate complexity  EVALUATION COMPLEXITY: Moderate  PLAN:  PT FREQUENCY: 1-2x/week  PT DURATION: 12 weeks  PLANNED INTERVENTIONS: 97164- PT Re-evaluation, 97110-Therapeutic exercises, 97530- Therapeutic activity, 97112- Neuromuscular re-education, 97535- Self Care, 16109- Manual therapy, [phone]- Gait training, [phone]- Canalith repositioning, Patient/Family education, Balance training, Stair training, Vestibular training, Visual/preceptual remediation/compensation, Cognitive remediation, and DME instructions   PLAN FOR NEXT SESSION:  - Nustep - follow-up on vestibular treatment from 06/28/2023 if needed - discuss future therapy schedule given on 16 visits approved under current authorization - follow-up on windswept posture - B LE functional strengthening   - adding weights/resistance training as appropriate   - continue bridges - dynamic balance and gait challenges with gaze stabilization dual-task challenges  - turning and stepping in different directions over small obstacles - dynamic reaching with turning and stepping - lateral side stepping, backwards stepping in // bars    Ginny Forth, PT, DPT, NCS, CSRS Physical Therapist - [facility]  5:06 PM 07/02/23

## 2023-07-05 ENCOUNTER — Ambulatory Visit: Payer: Medicare Other | Admitting: Physical Therapy

## 2023-07-05 ENCOUNTER — Encounter: Payer: Self-pay | Admitting: Family Medicine

## 2023-07-05 ENCOUNTER — Encounter: Payer: Medicare Other | Admitting: Speech Pathology

## 2023-07-05 DIAGNOSIS — R262 Difficulty in walking, not elsewhere classified: Secondary | ICD-10-CM

## 2023-07-05 DIAGNOSIS — M6281 Muscle weakness (generalized): Secondary | ICD-10-CM

## 2023-07-05 DIAGNOSIS — R296 Repeated falls: Secondary | ICD-10-CM | POA: Diagnosis not present

## 2023-07-05 DIAGNOSIS — R42 Dizziness and giddiness: Secondary | ICD-10-CM | POA: Diagnosis not present

## 2023-07-05 DIAGNOSIS — R278 Other lack of coordination: Secondary | ICD-10-CM

## 2023-07-05 DIAGNOSIS — E785 Hyperlipidemia, unspecified: Secondary | ICD-10-CM

## 2023-07-05 NOTE — Therapy (Signed)
 OUTPATIENT PHYSICAL THERAPY NEURO TREATMENT   Patient Name: Brittany Benton MRN: [id number] DOB:Oct 29, 1955, 68 y.o., female Today's Date: 07/05/2023   PCP: Malva Limes, MD REFERRING PROVIDER: Lonell Face, MD   END OF SESSION:    PT End of Session - 07/05/23 1017     Visit Number 9    Number of Visits 24    Date for PT Re-Evaluation 08/20/23    Authorization Type [employer] 2025 - auth 2/18-4/[id number] for 16 visits    PT Start Time 1017    PT Stop Time 1059    PT Time Calculation (min) 42 min    Equipment Utilized During Treatment Gait belt    Activity Tolerance Patient tolerated treatment well;No increased pain    Behavior During Therapy [facility] for tasks assessed/performed             Past Medical History:  Diagnosis Date   Allergy 1989   Anemia    Anxiety    Arthritis    osteoarthritis -right hip   Cataract    Depression    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   GERD (gastroesophageal reflux disease)    Hernia, incisional    after renal surgery   History of chicken pox    Hyperlipidemia    Hypertension    Renal cell carcinoma (HCC)    s/p right nephrectomy 1994   Sleep apnea    Thyroid disease    Past Surgical History:  Procedure Laterality Date   APPENDECTOMY  1994   BIOPSY  11/07/2022   Procedure: BIOPSY;  Surgeon: Wyline Mood, MD;  Location: [facility] ENDOSCOPY;  Service: Gastroenterology;;   BREAST BIOPSY Right 1991   Negative   CHOLECYSTECTOMY  1994   COLONOSCOPY WITH PROPOFOL N/A 11/07/2022   Procedure: COLONOSCOPY WITH PROPOFOL;  Surgeon: Wyline Mood, MD;  Location: [facility] ENDOSCOPY;  Service: Gastroenterology;  Laterality: N/A;   ESOPHAGOGASTRODUODENOSCOPY (EGD) WITH PROPOFOL N/A 11/07/2022   Procedure: ESOPHAGOGASTRODUODENOSCOPY (EGD) WITH PROPOFOL;  Surgeon: Wyline Mood, MD;  Location: [facility] ENDOSCOPY;  Service: Gastroenterology;  Laterality: N/A;   JOINT REPLACEMENT  2019   LAPAROSCOPIC GASTRIC SLEEVE RESECTION  02/02/2016   Dr Alva Garnet at  [facility]   NEPHRECTOMY  1994   Renal Cell Carcinoma   parotid gland removal  1990   also removed a tumor   POLYPECTOMY  11/07/2022   Procedure: POLYPECTOMY INTESTINAL;  Surgeon: Wyline Mood, MD;  Location: [facility] ENDOSCOPY;  Service: Gastroenterology;;   TONSILLECTOMY     TOTAL HIP ARTHROPLASTY Right 08/08/2016   Procedure: TOTAL HIP ARTHROPLASTY;  Surgeon: Deeann Saint, MD;  Location: [facility] ORS;  Service: Orthopedics;  Laterality: Right;   TUBAL LIGATION  1979   Patient Active Problem List   Diagnosis Date Noted   B12 deficiency 06/11/2022   Iron deficiency anemia 04/10/2022   Morbid obesity (HCC) 03/30/2022   Vitamin A deficiency 08/15/2021   Parkinson's disease (HCC) 08/03/2021   Acute urinary retention 08/03/2021   Meningioma (HCC) 04/05/2021   Hypothyroidism 08/15/2020   Osteopenia 06/14/2020   Status post total hip replacement, 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   Osteoarthritis of right hip 03/26/2016   H/O gastric bypass 03/26/2016   Sinus tachycardia 08/01/2015   Obesity (BMI 30-39.9) 06/08/2015   OSA (obstructive sleep apnea) 06/08/2015   GERD without esophagitis 06/08/2015   Incisional hernia 06/08/2015   Arthritis of hip 03/23/2015   Left knee pain 02/15/2015   Vitamin D deficiency 09/29/2014   Lump of right breast 09/22/2014   Prediabetes 09/22/2014  Pruritic dermatitis 09/22/2014   Obstructive sleep apnea 09/22/2014   Allergic rhinitis 09/15/2009   Hypersomnia 09/14/2009   Panic disorder 08/06/2008   Essential (primary) hypertension 07/16/2007   Hyperlipidemia 07/16/2007    ONSET DATE: Approximately 2 years ago  REFERRING DIAG: R26.89 (ICD-10-CM) - Balance problem   THERAPY DIAG:  Muscle weakness (generalized)  Repeated falls  Other lack of coordination  Dizziness and giddiness  Difficulty in walking, not elsewhere classified  Rationale for Evaluation and Treatment: Rehabilitation  SUBJECTIVE:                                                                                                                                                                                              SUBJECTIVE STATEMENT:  Pt reports she is doing good. Denies any episodes of dizziness since last visit. Pt reports still taking Sinemet and states it makes her thinking "much clearer." Denies pain. Pt reports they took their convertible out the other day and it sits very low to the ground and she was able to stand-up out of the car independently!  Pt accompanied by: significant other, [name]  PERTINENT HISTORY: Pt is a 68y.o. female with concern for Parkinsonism due to resting tremors (R>L) with occasional whole-body jerks/myoclonus, bradykinesia, hallucinations while waking up, bilateral bradykinesia (L>R), minimal cogwheeling bilaterally, sensation of stiffness, speech changes, and imbalance (trialed Carbidopa-Levodopa, but intolerable due to side effects of severe nausea & vomiting). Pt with hx of frequent falls and bilateral LE weakness. Pt reports hx of kidney removal on R side with large abdominal incision and R hip replacement.  PAIN:  Are you having pain? No  PRECAUTIONS: Fall  RED FLAGS: None   WEIGHT BEARING RESTRICTIONS: No  FALLS: Has patient fallen in last 6 months? Yes. Number of falls 5-6 (2 in driveway, 1 on sidewalk)  Most recent fall last Thursday 05/23/2023, pt reports she became "disoriented" and had LOB, but no injuries- pt states in general she is "very easily distracted and then she will misstep causing her to have LOB"  LIVING ENVIRONMENT: Lives with: lives with their spouse [name]) Lives in: House/apartment Stairs: Yes: Internal: reports she doesn't have to go to different floors in house (4 steps inside); and External: 3-4 steps to enter with R HRs + courtyard + additional 4-5 steps onto porch with B HRs -  entrance has steep driveway Has following equipment at home: Dan Humphreys - 4 wheeled, shower bench, grabbars in bathroom, bedrail, and Ramped entry from  garage (but has to go up steep driveway)  PLOF: Independent with basic ADLs, Independent with household mobility with device, Independent with household mobility  without device, and Requires assistive device for independence Reports started using rollator more consistently ~2-3weeks ago but has used it on/off for a while  PATIENT GOALS: Wants to feel comfortable moving around and be shown how to do that again  OBJECTIVE:  Note: Objective measures were completed at Evaluation unless otherwise noted.  DIAGNOSTIC FINDINGS:   CT Head without Contrast 04/03/2021: Chronic atrophic changes without acute abnormality.   MRI Brain without Contrast 04/04/2021: 1. No acute intracranial abnormality. 2. Mild age-related cerebral atrophy with chronic small vessel ischemic disease. 3. 1.1 cm meningioma overlying the left frontal convexity without associated mass effect or edema.   CT Cervical Spine without Contrast 04/03/2021: Moderate severity multilevel degenerative changes, most prominent at the levels of C4-C5, C5-C6 and C6-C7. No evidence of an acute fracture or subluxation.   COGNITION: Overall cognitive status: Within functional limits for tasks assessed   SENSATION: WFL  COORDINATION: WFL for basic functional mobility tasks  EDEMA:  None observed  MUSCLE TONE: WNL  MUSCLE LENGTH: Not tested  DTRs:  Not tested  POSTURE: rounded shoulders, forward head, and increased lumbar lordosis  LOWER EXTREMITY ROM:     Passive  All WFL for basic functional mobility tasks Right Eval Left Eval  Hip flexion    Hip extension    Hip abduction    Hip adduction    Hip internal rotation    Hip external rotation    Knee flexion    Knee extension    Ankle dorsiflexion    Ankle plantarflexion    Ankle inversion    Ankle eversion     (Blank rows = not tested)  LOWER EXTREMITY MMT:    MMT Right Eval Left Eval  Hip flexion 3+ 4  Hip extension    Hip abduction    Hip adduction    Hip  internal rotation    Hip external rotation    Knee flexion 4- 4-  Knee extension 4 4  Ankle dorsiflexion 4 4-  Ankle plantarflexion 4- 4-  Ankle inversion    Ankle eversion    (Blank rows = not tested)  Manual Muscle Test Scale 0/5 = No muscle contraction can be seen or felt 1/5 = Contraction can be felt, but there is no motion 2-/5 = Part moves through incomplete ROM w/ gravity decreased 2/5 = Part moves through complete ROM w/ gravity decreased 2+/5 = Part moves through incomplete ROM (<50%) against gravity or through complete ROM w/ gravity 3-/5 = Part moves through incomplete ROM (>50%) against gravity 3/5 = Part moves through complete ROM against gravity 3+/5 = Part moves through complete ROM against gravity/slight resistance 4-/5= Holds test position against slight to moderate pressure 4/5 = Part moves through complete ROM against gravity/moderate resistance 4+/5= Holds test position against moderate to strong pressure 5/5 = Part moves through complete ROM against gravity/full resistance  BED MOBILITY:  Sit to supine Min A and requires used of bedrail Supine to sit Min A and requires used of bedrail Rolling to Right Min A Rolling to Left Min A Relies on bedrial, difficulty with lateral scooting in the bed (or in sitting)  TRANSFERS: Assistive device utilized: Environmental consultant - 4 wheeled  Sit to stand: SBA and CGA Stand to sit: SBA and CGA Chair to chair: SBA and CGA Without AD requires more consistent CGA/light min A Floor:  not assessed  RAMP:  Assistive device utilized: Walker - 4 wheeled Ramp Comments: would benefit from assessment of this when able due  to steep driveway at home  CURB:   Curb Comments: Not assessed  STAIRS: Level of Assistance: CGA Stair Negotiation Technique: Step to Pattern with Bilateral Rails ascending with R LE and descending with L LE Number of Stairs: 4  Height of Stairs: 6in  Comments: Pt reports feeling more confident when having B HR  support  GAIT: Gait pattern: step through pattern, decreased step length- Right, decreased step length- Left, decreased stride length, shuffling, trendelenburg, lateral hip instability, lateral lean- Right, lateral lean- Left, wide BOS, poor foot clearance- Right, and poor foot clearance- Left --- R/L lateral trunk lean over stance limb, decreased step lengths bilaterally, partial shuffled gait, wide BOS, increased balance instability Distance walked: ~42ft using HHA progressed to no UE support Assistive device utilized:  HHA progressed to no UE support Level of assistance: CGA and Min A Comments: Slow gait speed  FUNCTIONAL TESTS:  5 times sit to stand: on 06/04/2023: 59.25 seconds Timed up and go (TUG): 24.90sec without AD, 22.25 with rollator 3 minute walk test: on 06/04/2023: 27ft using rollator = 80.29meters 10 meter walk test: 36.75 seconds = 0.67m/s without AD; 06/04/23 0.48m/s average gait speed during 3 minute walk test using rollator  Berg Balance Scale: on 06/04/2023 = 31/56 Would benefit from Modified CTSIB & FGA  PATIENT SURVEYS:  ABC scale 17.5%  Details: patient 0% confident with majority of items; 50% confident household level ambulation, 40% stairs, 50% sweeping floor, 50% getting in/out of car, and 90% confident ambulating up/down ramp with rails    Vestibular Assessment  - performed on 06/07/2023 - re-assessed on 2/25 with details in that treatment note  OCULOMOTOR EXAM:  Ocular Alignment: normal  Ocular ROM: No Limitations  Spontaneous Nystagmus: absent  Gaze-Induced Nystagmus: absent and continues to have difficulty keeping eyes on target, especially towards L  Smooth Pursuits:  difficulty keeping eyes on target  Saccades: hypometric/undershoots, extra eye movements, and difficulty finding target on L side  Convergence/Divergence: not assessed Cover/Cross Cover: WNL  VESTIBULAR - OCULAR REFLEX:   VOR Cancellation: Unable to Maintain Gaze, inconsistent ability to  keep eyes on target  Head-Impulse Test: HIT Right: negative HIT Left: negative -- had a difficult time keeping eyes on target in both directions  Dynamic Visual Acuity:  would benefit from future testing    POSITIONAL TESTING: Right Dix-Hallpike: upbeating, right nystagmus Left Dix-Hallpike: benefit from testing at next visit Right Roll Test: no nystagmus Left Roll Test: no nystagmus Left Sidelying: no nystagmus  MOTION SENSITIVITY:  May benefit from testing in future visits                                                                                                   TREATMENT DATE:   07/05/23   Unless otherwise stated, CGA was provided and gait belt donned in order to ensure pt safety throughout session.  B UE and B LE reciprocal movement pattern endurance and strength training using Nustep against level 2 resistance increased to level 3 resistance after 2 minutes and level 4 resistance after 4 minutes - totaling 6 minutes and 237  steps. Pt able to keep steps per minute (SPM) >40 more than 90% of the time. Pt with decreased windswept position of LB on Nustep and improved R hip abducted to avoid bumping knee on machine.  B LE strength training including:  - repeated sit<>stands from EOM holding 3lb weights in each hand 2x10 reps with alternating either bicep curl vs overhead press while in standing - therapist providing motivational cuing to increase muscle recruitment and powering up to stand - pt does have to intermittently push hands on thighs to improve ability to rise to stand  - supine bridge x15reps  - added level 2 red theraband resistance around knees for 2nd set and pt successful with this today! Decreased windswept LB posturing towards the L with R hip adduction noted today - also added 3lb dumbbell weights on both hips for resisted weighted bridges x15reps *therapist providing cuing and facilitation for improved alignment throughout  In // bars: forward step-ups on  brown step using B UE support x8 reps R LE and only x5reps L LE due to fatigue but returned for additional 4reps on L LE  Pt reports she feels she is getting stronger.  Therapist notified SLP of pt's continued interest in receiving SLP services.   PATIENT EDUCATION:  Education details: PT POC, balance impairments and increased fall risk based on outcome measures, HEP Person educated: Patient and Spouse Education method: Explanation Education comprehension: verbalized understanding and needs further education  HOME EXERCISE PROGRAM:  Access Code: [email or web address] URL: [email or web address] Date: 06/04/2023 Prepared by: Casimiro Needle  Exercises - Walking with Rollator - 1 x daily - 7 x weekly - 3-5 sets - 3 minutes hold - Sit to Stand with Hands on Knees  - 1 x daily - 7 x weekly - 2 sets - 10 reps - Standing March with Counter Support  - 1 x daily - 7 x weekly - 2 sets - 10 reps  GOALS: Goals reviewed with patient? Yes  SHORT TERM GOALS: Target date: 07/09/2023   Patient will be independent in home exercise program to improve strength/mobility for better functional independence with ADLs. Baseline: provided on 06/04/2023 Goal status: INITIAL  2. Patient will improve ABC scale by 20% indicating increased confidence performing daily mobility tasks, which is associated with fall risk.    Baseline: 05/28/2023 = 17.5%   Goal Status: INITIAL  LONG TERM GOALS: Target date: 08/20/2023   Patient (> 33 years old) will complete five times sit to stand test in < 15 seconds indicating an increased LE strength and improved balance. Baseline: 06/04/2023: 59.25 seconds Goal status: INITIAL  Patient will increase Berg Balance score by > 6 points to demonstrate decreased fall risk during functional activities. Baseline: 06/04/2023: 31/56 Goal status: INITIAL   Patient will reduce timed up and go to <11 seconds to reduce fall risk and demonstrate improved transfer/gait ability. Baseline:  05/28/2023 = 22.25sec using rollator vs 24.90 seconds without AD Goal status: INITIAL  Patient will increase 10 meter walk test to >0.84m/s as to improve gait speed for better community ambulation and to reduce fall risk. Baseline: 05/28/2023 = 0.29m/s Goal status: INITIAL  Patient will increase 3 minute walk test distance to >351ft for progression to limited community ambulation and improved gait ability Baseline: 06/04/2023 = 272ft (80.62meters) Goal status: INITIAL   ASSESSMENT:  CLINICAL IMPRESSION:  Patient is a 69 y.o. female who was seen today for physical therapy treatment for impaired balance, dizziness, functional strength deficits, and gait deficits due to Parkinsonism  and likely BPPV. Pt reports no symptoms of vertigo since last therapy session. Therapy session focused on progressive B LE strengthening targeting hip/glutes and quad strengthening to improve pt's independence with transfers, gait, and stair navigation. Patient progressed to increased repetition of weighted sit<>stands and weighted supine glute bridges for more targeted strengthening. Pt demonstrates decreased windswept LB posturing today. Pt reports she is continuing to feel stronger. Ms. Weinert will benefit from continued skilled PT to improve these deficits in order to increase QOL, ease/safety with ADLs, decrease fall risk, and increase independence with functional mobility at household and community level.  OBJECTIVE IMPAIRMENTS: Abnormal gait, cardiopulmonary status limiting activity, decreased activity tolerance, decreased balance, decreased endurance, decreased mobility, difficulty walking, and decreased strength.   ACTIVITY LIMITATIONS: carrying, lifting, bending, standing, squatting, stairs, transfers, bed mobility, reach over head, and locomotion level  PARTICIPATION LIMITATIONS: meal prep, cleaning, laundry, shopping, and community activity  PERSONAL FACTORS: Age, Behavior pattern, Fitness, Time since onset of  injury/illness/exacerbation, and 3+ comorbidities: Parkinsonism, anxiety, arthritis, depression, HTN  are also affecting patient's functional outcome.   REHAB POTENTIAL: Good  CLINICAL DECISION MAKING: Evolving/moderate complexity  EVALUATION COMPLEXITY: Moderate  PLAN:  PT FREQUENCY: 1-2x/week  PT DURATION: 12 weeks  PLANNED INTERVENTIONS: 97164- PT Re-evaluation, 97110-Therapeutic exercises, 97530- Therapeutic activity, 97112- Neuromuscular re-education, 97535- Self Care, 47829- Manual therapy, [phone]- Gait training, [phone]- Canalith repositioning, Patient/Family education, Balance training, Stair training, Vestibular training, Visual/preceptual remediation/compensation, Cognitive remediation, and DME instructions   PLAN FOR NEXT SESSION:  - progress note - Nustep - discuss future therapy schedule given on 16 visits approved under current authorization - follow-up on vestibular treatment from 06/28/2023 if needed - B LE functional strengthening   - adding weights/resistance training as appropriate   - continue bridges - dynamic balance and gait challenges with gaze stabilization dual-task challenges  - turning and stepping in different directions over small obstacles - dynamic reaching with turning and stepping - lateral side stepping, backwards stepping in // bars    Ginny Forth, PT, DPT, NCS, CSRS Physical Therapist - [facility]  11:18 AM 07/05/23

## 2023-07-08 ENCOUNTER — Other Ambulatory Visit: Payer: Self-pay

## 2023-07-08 ENCOUNTER — Other Ambulatory Visit: Payer: Self-pay | Admitting: Family Medicine

## 2023-07-08 DIAGNOSIS — E785 Hyperlipidemia, unspecified: Secondary | ICD-10-CM

## 2023-07-08 MED ORDER — ATORVASTATIN CALCIUM 20 MG PO TABS: 20.0000 mg | ORAL_TABLET | Freq: Every evening | ORAL | 0 refills | Status: DC

## 2023-07-08 NOTE — Telephone Encounter (Signed)
 Copied from CRM [id number]. Topic: Clinical - Medication Refill >> Jul 08, 2023  9:48 AM Macon Large wrote: Most Recent Primary Care Visit:  Provider: Sue Lush  Department: [id number]  Visit Type: MEDICARE AWV, SEQUENTIAL  Date: 11/14/2022  Medication: atorvastatin (LIPITOR) 20 MG tablet  Has the patient contacted their pharmacy? Yes  Is this the correct pharmacy for this prescription? Yes If no, delete pharmacy and type the correct one.  This is the patient's preferred pharmacy:  [facility] - [place], [place] - [address] [place] [address] Phone: [phone] Fax: [phone]   Has the prescription been filled recently? No  Is the patient out of the medication? Yes  Has the patient been seen for an appointment in the last year OR does the patient have an upcoming appointment? Yes  Can we respond through MyChart? No  Agent: Please be advised that Rx refills may take up to 3 business days. We ask that you follow-up with your pharmacy.

## 2023-07-09 ENCOUNTER — Encounter: Payer: Medicare Other | Admitting: Speech Pathology

## 2023-07-09 ENCOUNTER — Ambulatory Visit: Payer: Medicare Other | Admitting: Physical Therapy

## 2023-07-09 DIAGNOSIS — R296 Repeated falls: Secondary | ICD-10-CM | POA: Diagnosis not present

## 2023-07-09 DIAGNOSIS — R42 Dizziness and giddiness: Secondary | ICD-10-CM

## 2023-07-09 DIAGNOSIS — R262 Difficulty in walking, not elsewhere classified: Secondary | ICD-10-CM

## 2023-07-09 DIAGNOSIS — M6281 Muscle weakness (generalized): Secondary | ICD-10-CM

## 2023-07-09 DIAGNOSIS — R278 Other lack of coordination: Secondary | ICD-10-CM | POA: Diagnosis not present

## 2023-07-09 NOTE — Therapy (Signed)
 OUTPATIENT PHYSICAL THERAPY NEURO TREATMENT  Physical Therapy Progress Note   Dates of reporting period  05/28/2023   to  07/09/2023    Patient Name: Brittany Benton MRN: [id number] DOB:Jan 11, 1956, 68 y.o., female Today's Date: 07/09/2023   PCP: Malva Limes, MD REFERRING PROVIDER: Lonell Face, MD   END OF SESSION:    PT End of Session - 07/09/23 1322     Visit Number 10    Number of Visits 24    Date for PT Re-Evaluation 08/20/23    Authorization Type [employer] 2025 - auth 2/18-4/15 for 16 visits    PT Start Time 1319    PT Stop Time 1359    PT Time Calculation (min) 40 min    Equipment Utilized During Treatment Gait belt    Activity Tolerance Patient tolerated treatment well;No increased pain    Behavior During Therapy [facility] for tasks assessed/performed              Past Medical History:  Diagnosis Date   Allergy 1989   Anemia    Anxiety    Arthritis    osteoarthritis -right hip   Cataract    Depression    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   GERD (gastroesophageal reflux disease)    Hernia, incisional    after renal surgery   History of chicken pox    Hyperlipidemia    Hypertension    Renal cell carcinoma (HCC)    s/p right nephrectomy 1994   Sleep apnea    Thyroid disease    Past Surgical History:  Procedure Laterality Date   APPENDECTOMY  1994   BIOPSY  11/07/2022   Procedure: BIOPSY;  Surgeon: Wyline Mood, MD;  Location: [facility] ENDOSCOPY;  Service: Gastroenterology;;   BREAST BIOPSY Right 1991   Negative   CHOLECYSTECTOMY  1994   COLONOSCOPY WITH PROPOFOL N/A 11/07/2022   Procedure: COLONOSCOPY WITH PROPOFOL;  Surgeon: Wyline Mood, MD;  Location: [facility] ENDOSCOPY;  Service: Gastroenterology;  Laterality: N/A;   ESOPHAGOGASTRODUODENOSCOPY (EGD) WITH PROPOFOL N/A 11/07/2022   Procedure: ESOPHAGOGASTRODUODENOSCOPY (EGD) WITH PROPOFOL;  Surgeon: Wyline Mood, MD;  Location: [facility] ENDOSCOPY;  Service: Gastroenterology;  Laterality:  N/A;   JOINT REPLACEMENT  2019   LAPAROSCOPIC GASTRIC SLEEVE RESECTION  02/02/2016   Dr Alva Garnet at [facility]   NEPHRECTOMY  1994   Renal Cell Carcinoma   parotid gland removal  1990   also removed a tumor   POLYPECTOMY  11/07/2022   Procedure: POLYPECTOMY INTESTINAL;  Surgeon: Wyline Mood, MD;  Location: [facility] ENDOSCOPY;  Service: Gastroenterology;;   TONSILLECTOMY     TOTAL HIP ARTHROPLASTY Right 08/08/2016   Procedure: TOTAL HIP ARTHROPLASTY;  Surgeon: Deeann Saint, MD;  Location: [facility] ORS;  Service: Orthopedics;  Laterality: Right;   TUBAL LIGATION  1979   Patient Active Problem List   Diagnosis Date Noted   B12 deficiency 06/11/2022   Iron deficiency anemia 04/10/2022   Morbid obesity (HCC) 03/30/2022   Vitamin A deficiency 08/15/2021   Parkinson's disease (HCC) 08/03/2021   Acute urinary retention 08/03/2021   Meningioma (HCC) 04/05/2021   Hypothyroidism 08/15/2020   Osteopenia 06/14/2020   Status post total hip replacement, 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   Osteoarthritis of right hip 03/26/2016   H/O gastric bypass 03/26/2016   Sinus tachycardia 08/01/2015   Obesity (BMI 30-39.9) 06/08/2015   OSA (obstructive sleep apnea) 06/08/2015   GERD without esophagitis 06/08/2015   Incisional hernia 06/08/2015   Arthritis of hip 03/23/2015   Left knee  pain 02/15/2015   Vitamin D deficiency 09/29/2014   Lump of right breast 09/22/2014   Prediabetes 09/22/2014   Pruritic dermatitis 09/22/2014   Obstructive sleep apnea 09/22/2014   Allergic rhinitis 09/15/2009   Hypersomnia 09/14/2009   Panic disorder 08/06/2008   Essential (primary) hypertension 07/16/2007   Hyperlipidemia 07/16/2007    ONSET DATE: Approximately 2 years ago  REFERRING DIAG: R26.89 (ICD-10-CM) - Balance problem   THERAPY DIAG:  Muscle weakness (generalized)  Repeated falls  Other lack of coordination  Dizziness and giddiness  Difficulty in walking, not elsewhere classified  Rationale for Evaluation and  Treatment: Rehabilitation  SUBJECTIVE:                                                                                                                                                                                             SUBJECTIVE STATEMENT:   Pt reports she is doing good. Denies any episodes of dizziness since last visit, even during her dentist appointment yesterday.   Pt accompanied by: self and family member (sister in waiting area)  PERTINENT HISTORY: Pt is a 68y.o. female with concern for Parkinsonism due to resting tremors (R>L) with occasional whole-body jerks/myoclonus, bradykinesia, hallucinations while waking up, bilateral bradykinesia (L>R), minimal cogwheeling bilaterally, sensation of stiffness, speech changes, and imbalance (trialed Carbidopa-Levodopa, but intolerable due to side effects of severe nausea & vomiting). Pt with hx of frequent falls and bilateral LE weakness. Pt reports hx of kidney removal on R side with large abdominal incision and R hip replacement.  PAIN:  Are you having pain? No  PRECAUTIONS: Fall  RED FLAGS: None   WEIGHT BEARING RESTRICTIONS: No  FALLS: Has patient fallen in last 6 months? Yes. Number of falls 5-6 (2 in driveway, 1 on sidewalk)  Most recent fall last Thursday 05/23/2023, pt reports she became "disoriented" and had LOB, but no injuries- pt states in general she is "very easily distracted and then she will misstep causing her to have LOB"  LIVING ENVIRONMENT: Lives with: lives with their spouse [name]) Lives in: House/apartment Stairs: Yes: Internal: reports she doesn't have to go to different floors in house (4 steps inside); and External: 3-4 steps to enter with R HRs + courtyard + additional 4-5 steps onto porch with B HRs -  entrance has steep driveway Has following equipment at home: Dan Humphreys - 4 wheeled, shower bench, grabbars in bathroom, bedrail, and Ramped entry from garage (but has to go up steep driveway)  PLOF: Independent  with basic ADLs, Independent with household mobility with device, Independent with household mobility without device, and Requires assistive device for independence Reports started  using rollator more consistently ~2-3weeks ago but has used it on/off for a while  PATIENT GOALS: Wants to feel comfortable moving around and be shown how to do that again  OBJECTIVE:  Note: Objective measures were completed at Evaluation unless otherwise noted.  DIAGNOSTIC FINDINGS:   CT Head without Contrast 04/03/2021: Chronic atrophic changes without acute abnormality.   MRI Brain without Contrast 04/04/2021: 1. No acute intracranial abnormality. 2. Mild age-related cerebral atrophy with chronic small vessel ischemic disease. 3. 1.1 cm meningioma overlying the left frontal convexity without associated mass effect or edema.   CT Cervical Spine without Contrast 04/03/2021: Moderate severity multilevel degenerative changes, most prominent at the levels of C4-C5, C5-C6 and C6-C7. No evidence of an acute fracture or subluxation.   COGNITION: Overall cognitive status: Within functional limits for tasks assessed   SENSATION: WFL  COORDINATION: WFL for basic functional mobility tasks  EDEMA:  None observed  MUSCLE TONE: WNL  MUSCLE LENGTH: Not tested  DTRs:  Not tested  POSTURE: rounded shoulders, forward head, and increased lumbar lordosis  LOWER EXTREMITY ROM:     Passive  All WFL for basic functional mobility tasks Right Eval Left Eval  Hip flexion    Hip extension    Hip abduction    Hip adduction    Hip internal rotation    Hip external rotation    Knee flexion    Knee extension    Ankle dorsiflexion    Ankle plantarflexion    Ankle inversion    Ankle eversion     (Blank rows = not tested)  LOWER EXTREMITY MMT:    MMT Right Eval Left Eval  Hip flexion 3+ 4  Hip extension    Hip abduction    Hip adduction    Hip internal rotation    Hip external rotation    Knee flexion  4- 4-  Knee extension 4 4  Ankle dorsiflexion 4 4-  Ankle plantarflexion 4- 4-  Ankle inversion    Ankle eversion    (Blank rows = not tested)  Manual Muscle Test Scale 0/5 = No muscle contraction can be seen or felt 1/5 = Contraction can be felt, but there is no motion 2-/5 = Part moves through incomplete ROM w/ gravity decreased 2/5 = Part moves through complete ROM w/ gravity decreased 2+/5 = Part moves through incomplete ROM (<50%) against gravity or through complete ROM w/ gravity 3-/5 = Part moves through incomplete ROM (>50%) against gravity 3/5 = Part moves through complete ROM against gravity 3+/5 = Part moves through complete ROM against gravity/slight resistance 4-/5= Holds test position against slight to moderate pressure 4/5 = Part moves through complete ROM against gravity/moderate resistance 4+/5= Holds test position against moderate to strong pressure 5/5 = Part moves through complete ROM against gravity/full resistance  BED MOBILITY:  Sit to supine Min A and requires used of bedrail Supine to sit Min A and requires used of bedrail Rolling to Right Min A Rolling to Left Min A Relies on bedrial, difficulty with lateral scooting in the bed (or in sitting)  TRANSFERS: Assistive device utilized: Environmental consultant - 4 wheeled  Sit to stand: SBA and CGA Stand to sit: SBA and CGA Chair to chair: SBA and CGA Without AD requires more consistent CGA/light min A Floor:  not assessed  RAMP:  Assistive device utilized: Walker - 4 wheeled Ramp Comments: would benefit from assessment of this when able due to steep driveway at home  CURB:   Curb  Comments: Not assessed  STAIRS: Level of Assistance: CGA Stair Negotiation Technique: Step to Pattern with Bilateral Rails ascending with R LE and descending with L LE Number of Stairs: 4  Height of Stairs: 6in  Comments: Pt reports feeling more confident when having B HR support  GAIT: Gait pattern: step through pattern, decreased  step length- Right, decreased step length- Left, decreased stride length, shuffling, trendelenburg, lateral hip instability, lateral lean- Right, lateral lean- Left, wide BOS, poor foot clearance- Right, and poor foot clearance- Left --- R/L lateral trunk lean over stance limb, decreased step lengths bilaterally, partial shuffled gait, wide BOS, increased balance instability Distance walked: ~4ft using HHA progressed to no UE support Assistive device utilized:  HHA progressed to no UE support Level of assistance: CGA and Min A Comments: Slow gait speed  FUNCTIONAL TESTS:  5 times sit to stand: on 06/04/2023: 59.25 seconds Timed up and go (TUG): 24.90sec without AD, 22.25 with rollator 3 minute walk test: on 06/04/2023: 213ft using rollator = 80.10meters 10 meter walk test: 36.75 seconds = 0.77m/s without AD; 06/04/23 0.10m/s average gait speed during 3 minute walk test using rollator  Berg Balance Scale: on 06/04/2023 = 31/56 Would benefit from Modified CTSIB & FGA  PATIENT SURVEYS:  ABC scale 17.5%  Details: patient 0% confident with majority of items; 50% confident household level ambulation, 40% stairs, 50% sweeping floor, 50% getting in/out of car, and 90% confident ambulating up/down ramp with rails    Vestibular Assessment  - performed on 06/07/2023 - re-assessed on 2/25 with details in that treatment note  OCULOMOTOR EXAM:  Ocular Alignment: normal  Ocular ROM: No Limitations  Spontaneous Nystagmus: absent  Gaze-Induced Nystagmus: absent and continues to have difficulty keeping eyes on target, especially towards L  Smooth Pursuits:  difficulty keeping eyes on target  Saccades: hypometric/undershoots, extra eye movements, and difficulty finding target on L side  Convergence/Divergence: not assessed Cover/Cross Cover: WNL  VESTIBULAR - OCULAR REFLEX:   VOR Cancellation: Unable to Maintain Gaze, inconsistent ability to keep eyes on target  Head-Impulse Test: HIT Right: negative HIT  Left: negative -- had a difficult time keeping eyes on target in both directions  Dynamic Visual Acuity:  would benefit from future testing    POSITIONAL TESTING: Right Dix-Hallpike: upbeating, right nystagmus Left Dix-Hallpike: benefit from testing at next visit Right Roll Test: no nystagmus Left Roll Test: no nystagmus Left Sidelying: no nystagmus  MOTION SENSITIVITY:  May benefit from testing in future visits                                                                                                   TREATMENT DATE:   07/09/23   Therapy session focused on re-assessment of standardized outcome measures to assess pt's progress with therapy thus far.  Unless otherwise stated, CGA/SBA was provided and gait belt donned in order to ensure pt safety throughout session.  10 Meter Walk Test: Patient instructed to walk 10 meters (32.8 ft) as quickly and as safely as possible at their normal speed Results: 0.59 m/s (16.90 seconds) using rollator Results:  0.43 m/s (22.89 seconds) without AD  Cut off scores:   Household Ambulator  < 0.4 m/s  Limited Community Ambulator  0.4 - 0.8 m/s  [employer]  > 0.8 m/s  Increased fall risk  < 1.67m/s  Crossing a Street  >1.78m/s  MCID 0.05 m/s (small), 0.13 m/s (moderate), 0.06 m/s (significant)  (ANPTA Core Set of Outcome Measures for Adults with Neurologic Conditions, 2018)    Five times Sit to Stand Test (FTSS) "Stand up and sit down as quickly as possible 5 times, keeping your arms folded across your chest."    TIME: 20.27 seconds using UE support on armrests - tried multiple times with arms across chest, but unsuccessful today  Times > 13.6 seconds is associated with increased disability and morbidity (Guralnik, 2000) Times > 15 seconds is predictive of recurrent falls in healthy individuals aged 45 and older (Buatois, et al., 2008) Normal performance values in community dwelling individuals aged 31 and older (Bohannon,  2006): 60-69 years: 11.4 seconds 70-79 years: 12.6 seconds 80-89 years: 14.8 seconds  MCID: >= 2.3 seconds for Vestibular Disorders (Meretta, 2006)   Participated in Timed Up and Go (TUG): 1st trial: 18.35 seconds using rollator 2nd trial: 18.44 seconds using rollator Patient demonstrates high fall risk as indicated by requiring >13.5seconds to complete the TUG.   TUG without AD 1st: 20 seconds without AD 2nd: 19.07 sec without AD *provided close SBA for pt safety   Patient participated in [employer] and demonstrates increased fall risk as noted by score of   38/56.  (<36= high risk for falls, close to 100%; 37-45 significant >80%; 46-51 moderate >50%; 52-55 lower >25%). Pt with greatest difficulty on items involving: turning, reaching, and narrow BOS/tandem/SLS   [facility] PT Assessment - 07/09/23 0001       Berg Balance Test   Sit to Stand Able to stand  independently using hands    Standing Unsupported Able to stand safely 2 minutes    Sitting with Back Unsupported but Feet Supported on Floor or Stool Able to sit safely and securely 2 minutes    Stand to Sit Sits safely with minimal use of hands    Transfers Able to transfer safely, minor use of hands    Standing Unsupported with Eyes Closed Able to stand 10 seconds safely    Standing Unsupported with Feet Together Needs help to attain position but able to stand for 30 seconds with feet together   needs help to attain but maintains balance for 1 min   From Standing, Reach Forward with Outstretched Arm Can reach forward >12 cm safely (5")    From Standing Position, Pick up Object from Floor Able to pick up shoe, needs supervision    From Standing Position, Turn to Look Behind Over each Shoulder Turn sideways only but maintains balance    Turn 360 Degrees Able to turn 360 degrees safely but slowly    Standing Unsupported, Alternately Place Feet on Step/Stool Able to complete >2 steps/needs minimal assist    Standing  Unsupported, One Foot in Front Able to take small step independently and hold 30 seconds    Standing on One Leg Tries to lift leg/unable to hold 3 seconds but remains standing independently    Total Score 38    Berg comment: High Fall Risk             Pt educated on results of re-assessment today with improvements noted; however, continuing to indicate high fall  risk.   PATIENT EDUCATION:  Education details: PT POC, balance impairments and increased fall risk based on outcome measures, HEP Person educated: Patient and Spouse Education method: Explanation Education comprehension: verbalized understanding and needs further education  HOME EXERCISE PROGRAM:  Access Code: [email or web address] URL: [email or web address] Date: 06/04/2023 Prepared by: Casimiro Needle  Exercises - Walking with Rollator - 1 x daily - 7 x weekly - 3-5 sets - 3 minutes hold - Sit to Stand with Hands on Knees  - 1 x daily - 7 x weekly - 2 sets - 10 reps - Standing March with Counter Support  - 1 x daily - 7 x weekly - 2 sets - 10 reps  GOALS: Goals reviewed with patient? Yes  SHORT TERM GOALS: Target date: 07/09/2023   Patient will be independent in home exercise program to improve strength/mobility for better functional independence with ADLs. Baseline: provided on 06/04/2023 07/09/23: pt reports performing, will need to progress Goal status: IN PROGRESS  2. Patient will improve ABC scale by 20% indicating increased confidence performing daily mobility tasks, which is associated with fall risk.    Baseline: 05/28/2023 = 17.5%   07/09/23: need to re-assess   Goal Status: IN PROGRESS  LONG TERM GOALS: Target date: 08/20/2023   Patient (> 69 years old) will complete five times sit to stand test in < 15 seconds indicating an increased LE strength and improved balance. Baseline: 06/04/2023: 59.25 seconds 07/09/23: 20.27 seconds using UE support on armrests Goal status: IN PROGRESS  Patient will increase  Berg Balance score by > 6 points to demonstrate decreased fall risk during functional activities. Baseline: 06/04/2023: 31/56 07/09/23: 38/56 Goal Updated: Will increase Berg Balance score to >45/56 Goal status: MET and Revised on 07/09/2023   Patient will reduce timed up and go to <11 seconds to reduce fall risk and demonstrate improved transfer/gait ability. Baseline: 05/28/2023 = 22.25sec using rollator vs 24.90 seconds without AD 07/09/23: 18.35 seconds using rollator and 19.07 sec without AD Goal status: IN PROGRESS  Patient will increase 10 meter walk test to >0.38m/s as to improve gait speed for better community ambulation and to reduce fall risk. Baseline: 05/28/2023 = 0.79m/s 07/09/23: 0.59 m/s (16.90 seconds) using rollator & 0.43 m/s (22.89 seconds) without AD Goal status: IN PROGRESS  Patient will increase 3 minute walk test distance to >337ft for progression to limited community ambulation and improved gait ability Baseline: 06/04/2023 = 249ft (80.25meters) using rollator 07/09/23: need to re-assess Goal status: IN PROGRESS   ASSESSMENT:  CLINICAL IMPRESSION:  Patient is a 68 y.o. female who was seen today for physical therapy treatment for impaired balance, dizziness, functional strength deficits, and gait deficits due to Parkinsonism and likely BPPV. Pt reports no symptoms of vertigo since last therapy session. Therapy session focused on reassessment of standardized outcome measures to assess pt's progress with therapy thus far. Pt demonstrates improvements on 5xSTS, Berg Balance Scale, TUG, and ; however, despite improvements she remains in the high fall risk categories. Pt unable to complete 5xSTS today without UE support demonstrating functional B LE weakness impacting her ability to complete transfers, gait, and stair navigation. Pt also demonstrates a gait speed indicative of a limited community ambulator using her rollator. Pt demonstrates impaired standing balance as noted  on Berg with greatest challenge performing items involving turning, reaching, and narrow BOS/tandem/SLS. Ms. Kerrick will benefit from continued skilled PT to improve these deficits in order to increase QOL, ease/safety with ADLs, decrease fall risk, and increase independence with  functional mobility at household and community level. Patient's condition has the potential to improve in response to therapy. Maximum improvement is yet to be obtained. The anticipated improvement is attainable and reasonable in a generally predictable time.     OBJECTIVE IMPAIRMENTS: Abnormal gait, cardiopulmonary status limiting activity, decreased activity tolerance, decreased balance, decreased endurance, decreased mobility, difficulty walking, and decreased strength.   ACTIVITY LIMITATIONS: carrying, lifting, bending, standing, squatting, stairs, transfers, bed mobility, reach over head, and locomotion level  PARTICIPATION LIMITATIONS: meal prep, cleaning, laundry, shopping, and community activity  PERSONAL FACTORS: Age, Behavior pattern, Fitness, Time since onset of injury/illness/exacerbation, and 3+ comorbidities: Parkinsonism, anxiety, arthritis, depression, HTN  are also affecting patient's functional outcome.   REHAB POTENTIAL: Good  CLINICAL DECISION MAKING: Evolving/moderate complexity  EVALUATION COMPLEXITY: Moderate  PLAN:  PT FREQUENCY: 1-2x/week  PT DURATION: 12 weeks  PLANNED INTERVENTIONS: 97164- PT Re-evaluation, 97110-Therapeutic exercises, 97530- Therapeutic activity, 97112- Neuromuscular re-education, 97535- Self Care, 40981- Manual therapy, [phone]- Gait training, [phone]- Canalith repositioning, Patient/Family education, Balance training, Stair training, Vestibular training, Visual/preceptual remediation/compensation, Cognitive remediation, and DME instructions   PLAN FOR NEXT SESSION:  - progress note on 07/09/2023  - didn't get to 3 min walk test, 5xSTS without UE support, nor ABC scale -  Nustep - follow-up on vestibular treatment from 06/28/2023 if needed - B LE functional strengthening   - adding weights/resistance training as appropriate   - continue bridges - dynamic balance and gait challenges with gaze stabilization dual-task challenges  - turning and stepping in different directions over small obstacles - dynamic reaching with turning and stepping - lateral side stepping, backwards stepping in // bars *pt very fearful of falling with balance interventions*   Dequavius Kuhner Verdell Face, PT, DPT, NCS, CSRS Physical Therapist - [facility]  [facility]  8:56 PM 07/09/23

## 2023-07-11 ENCOUNTER — Ambulatory Visit: Payer: Medicare Other

## 2023-07-11 NOTE — Therapy (Signed)
 OUTPATIENT PHYSICAL THERAPY NEURO TREATMENT   Patient Name: Brittany Benton MRN: [id number] DOB:02/02/1956, 68 y.o., female Today's Date: 07/12/2023   PCP: Malva Limes, MD REFERRING PROVIDER: Lonell Face, MD   END OF SESSION:    PT End of Session - 07/12/23 1021     Visit Number 11    Number of Visits 24    Date for PT Re-Evaluation 08/20/23    Authorization Type [employer] 2025 - auth 2/18-4/15 for 16 visits    PT Start Time 1021    PT Stop Time 1059    PT Time Calculation (min) 38 min    Equipment Utilized During Treatment Gait belt    Activity Tolerance Patient tolerated treatment well;No increased pain    Behavior During Therapy [facility] for tasks assessed/performed               Past Medical History:  Diagnosis Date   Allergy 1989   Anemia    Anxiety    Arthritis    osteoarthritis -right hip   Cataract    Depression    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   GERD (gastroesophageal reflux disease)    Hernia, incisional    after renal surgery   History of chicken pox    Hyperlipidemia    Hypertension    Renal cell carcinoma (HCC)    s/p right nephrectomy 1994   Sleep apnea    Thyroid disease    Past Surgical History:  Procedure Laterality Date   APPENDECTOMY  1994   BIOPSY  11/07/2022   Procedure: BIOPSY;  Surgeon: Wyline Mood, MD;  Location: [facility] ENDOSCOPY;  Service: Gastroenterology;;   BREAST BIOPSY Right 1991   Negative   CHOLECYSTECTOMY  1994   COLONOSCOPY WITH PROPOFOL N/A 11/07/2022   Procedure: COLONOSCOPY WITH PROPOFOL;  Surgeon: Wyline Mood, MD;  Location: [facility] ENDOSCOPY;  Service: Gastroenterology;  Laterality: N/A;   ESOPHAGOGASTRODUODENOSCOPY (EGD) WITH PROPOFOL N/A 11/07/2022   Procedure: ESOPHAGOGASTRODUODENOSCOPY (EGD) WITH PROPOFOL;  Surgeon: Wyline Mood, MD;  Location: [facility] ENDOSCOPY;  Service: Gastroenterology;  Laterality: N/A;   JOINT REPLACEMENT  2019   LAPAROSCOPIC GASTRIC SLEEVE RESECTION  02/02/2016   Dr  Alva Garnet at [facility]   NEPHRECTOMY  1994   Renal Cell Carcinoma   parotid gland removal  1990   also removed a tumor   POLYPECTOMY  11/07/2022   Procedure: POLYPECTOMY INTESTINAL;  Surgeon: Wyline Mood, MD;  Location: [facility] ENDOSCOPY;  Service: Gastroenterology;;   TONSILLECTOMY     TOTAL HIP ARTHROPLASTY Right 08/08/2016   Procedure: TOTAL HIP ARTHROPLASTY;  Surgeon: Deeann Saint, MD;  Location: [facility] ORS;  Service: Orthopedics;  Laterality: Right;   TUBAL LIGATION  1979   Patient Active Problem List   Diagnosis Date Noted   B12 deficiency 06/11/2022   Iron deficiency anemia 04/10/2022   Morbid obesity (HCC) 03/30/2022   Vitamin A deficiency 08/15/2021   Parkinson's disease (HCC) 08/03/2021   Acute urinary retention 08/03/2021   Meningioma (HCC) 04/05/2021   Hypothyroidism 08/15/2020   Osteopenia 06/14/2020   Status post total hip replacement, 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   Osteoarthritis of right hip 03/26/2016   H/O gastric bypass 03/26/2016   Sinus tachycardia 08/01/2015   Obesity (BMI 30-39.9) 06/08/2015   OSA (obstructive sleep apnea) 06/08/2015   GERD without esophagitis 06/08/2015   Incisional hernia 06/08/2015   Arthritis of hip 03/23/2015   Left knee pain 02/15/2015   Vitamin D deficiency 09/29/2014   Lump of right breast 09/22/2014   Prediabetes  09/22/2014   Pruritic dermatitis 09/22/2014   Obstructive sleep apnea 09/22/2014   Allergic rhinitis 09/15/2009   Hypersomnia 09/14/2009   Panic disorder 08/06/2008   Essential (primary) hypertension 07/16/2007   Hyperlipidemia 07/16/2007    ONSET DATE: Approximately 2 years ago  REFERRING DIAG: R26.89 (ICD-10-CM) - Balance problem   THERAPY DIAG:  Muscle weakness (generalized)  Repeated falls  Other lack of coordination  Difficulty in walking, not elsewhere classified  Rationale for Evaluation and Treatment: Rehabilitation  SUBJECTIVE:                                                                                                                                                                                              SUBJECTIVE STATEMENT:   Patient reports she is just "quiet" today.   Pt accompanied by: self and family member (sister in waiting area)  PERTINENT HISTORY: Pt is a 67y.o. female with concern for Parkinsonism due to resting tremors (R>L) with occasional whole-body jerks/myoclonus, bradykinesia, hallucinations while waking up, bilateral bradykinesia (L>R), minimal cogwheeling bilaterally, sensation of stiffness, speech changes, and imbalance (trialed Carbidopa-Levodopa, but intolerable due to side effects of severe nausea & vomiting). Pt with hx of frequent falls and bilateral LE weakness. Pt reports hx of kidney removal on R side with large abdominal incision and R hip replacement.  PAIN:  Are you having pain? No  PRECAUTIONS: Fall  RED FLAGS: None   WEIGHT BEARING RESTRICTIONS: No  FALLS: Has patient fallen in last 6 months? Yes. Number of falls 5-6 (2 in driveway, 1 on sidewalk)  Most recent fall last Thursday 05/23/2023, pt reports she became "disoriented" and had LOB, but no injuries- pt states in general she is "very easily distracted and then she will misstep causing her to have LOB"  LIVING ENVIRONMENT: Lives with: lives with their spouse [name]) Lives in: House/apartment Stairs: Yes: Internal: reports she doesn't have to go to different floors in house (4 steps inside); and External: 3-4 steps to enter with R HRs + courtyard + additional 4-5 steps onto porch with B HRs -  entrance has steep driveway Has following equipment at home: Dan Humphreys - 4 wheeled, shower bench, grabbars in bathroom, bedrail, and Ramped entry from garage (but has to go up steep driveway)  PLOF: Independent with basic ADLs, Independent with household mobility with device, Independent with household mobility without device, and Requires assistive device for independence Reports started using rollator more  consistently ~2-3weeks ago but has used it on/off for a while  PATIENT GOALS: Wants to feel comfortable moving around and be shown how to do that again  OBJECTIVE:  Note:  Objective measures were completed at Evaluation unless otherwise noted.  DIAGNOSTIC FINDINGS:   CT Head without Contrast 04/03/2021: Chronic atrophic changes without acute abnormality.   MRI Brain without Contrast 04/04/2021: 1. No acute intracranial abnormality. 2. Mild age-related cerebral atrophy with chronic small vessel ischemic disease. 3. 1.1 cm meningioma overlying the left frontal convexity without associated mass effect or edema.   CT Cervical Spine without Contrast 04/03/2021: Moderate severity multilevel degenerative changes, most prominent at the levels of C4-C5, C5-C6 and C6-C7. No evidence of an acute fracture or subluxation.   COGNITION: Overall cognitive status: Within functional limits for tasks assessed   SENSATION: WFL  COORDINATION: WFL for basic functional mobility tasks  EDEMA:  None observed  MUSCLE TONE: WNL  MUSCLE LENGTH: Not tested  DTRs:  Not tested  POSTURE: rounded shoulders, forward head, and increased lumbar lordosis  LOWER EXTREMITY ROM:     Passive  All WFL for basic functional mobility tasks Right Eval Left Eval  Hip flexion    Hip extension    Hip abduction    Hip adduction    Hip internal rotation    Hip external rotation    Knee flexion    Knee extension    Ankle dorsiflexion    Ankle plantarflexion    Ankle inversion    Ankle eversion     (Blank rows = not tested)  LOWER EXTREMITY MMT:    MMT Right Eval Left Eval  Hip flexion 3+ 4  Hip extension    Hip abduction    Hip adduction    Hip internal rotation    Hip external rotation    Knee flexion 4- 4-  Knee extension 4 4  Ankle dorsiflexion 4 4-  Ankle plantarflexion 4- 4-  Ankle inversion    Ankle eversion    (Blank rows = not tested)  Manual Muscle Test Scale 0/5 = No muscle  contraction can be seen or felt 1/5 = Contraction can be felt, but there is no motion 2-/5 = Part moves through incomplete ROM w/ gravity decreased 2/5 = Part moves through complete ROM w/ gravity decreased 2+/5 = Part moves through incomplete ROM (<50%) against gravity or through complete ROM w/ gravity 3-/5 = Part moves through incomplete ROM (>50%) against gravity 3/5 = Part moves through complete ROM against gravity 3+/5 = Part moves through complete ROM against gravity/slight resistance 4-/5= Holds test position against slight to moderate pressure 4/5 = Part moves through complete ROM against gravity/moderate resistance 4+/5= Holds test position against moderate to strong pressure 5/5 = Part moves through complete ROM against gravity/full resistance  BED MOBILITY:  Sit to supine Min A and requires used of bedrail Supine to sit Min A and requires used of bedrail Rolling to Right Min A Rolling to Left Min A Relies on bedrial, difficulty with lateral scooting in the bed (or in sitting)  TRANSFERS: Assistive device utilized: Environmental consultant - 4 wheeled  Sit to stand: SBA and CGA Stand to sit: SBA and CGA Chair to chair: SBA and CGA Without AD requires more consistent CGA/light min A Floor:  not assessed  RAMP:  Assistive device utilized: Environmental consultant - 4 wheeled Ramp Comments: would benefit from assessment of this when able due to steep driveway at home  CURB:   Curb Comments: Not assessed  STAIRS: Level of Assistance: CGA Stair Negotiation Technique: Step to Pattern with Bilateral Rails ascending with R LE and descending with L LE Number of Stairs: 4  Height of Stairs:  6in  Comments: Pt reports feeling more confident when having B HR support  GAIT: Gait pattern: step through pattern, decreased step length- Right, decreased step length- Left, decreased stride length, shuffling, trendelenburg, lateral hip instability, lateral lean- Right, lateral lean- Left, wide BOS, poor foot  clearance- Right, and poor foot clearance- Left --- R/L lateral trunk lean over stance limb, decreased step lengths bilaterally, partial shuffled gait, wide BOS, increased balance instability Distance walked: ~84ft using HHA progressed to no UE support Assistive device utilized:  HHA progressed to no UE support Level of assistance: CGA and Min A Comments: Slow gait speed  FUNCTIONAL TESTS:  5 times sit to stand: on 06/04/2023: 59.25 seconds Timed up and go (TUG): 24.90sec without AD, 22.25 with rollator 3 minute walk test: on 06/04/2023: 216ft using rollator = 80.42meters 10 meter walk test: 36.75 seconds = 0.4m/s without AD; 06/04/23 0.67m/s average gait speed during 3 minute walk test using rollator  Berg Balance Scale: on 06/04/2023 = 31/56 Would benefit from Modified CTSIB & FGA  PATIENT SURVEYS:  ABC scale 17.5%  Details: patient 0% confident with majority of items; 50% confident household level ambulation, 40% stairs, 50% sweeping floor, 50% getting in/out of car, and 90% confident ambulating up/down ramp with rails    Vestibular Assessment  - performed on 06/07/2023 - re-assessed on 2/25 with details in that treatment note  OCULOMOTOR EXAM:  Ocular Alignment: normal  Ocular ROM: No Limitations  Spontaneous Nystagmus: absent  Gaze-Induced Nystagmus: absent and continues to have difficulty keeping eyes on target, especially towards L  Smooth Pursuits:  difficulty keeping eyes on target  Saccades: hypometric/undershoots, extra eye movements, and difficulty finding target on L side  Convergence/Divergence: not assessed Cover/Cross Cover: WNL  VESTIBULAR - OCULAR REFLEX:   VOR Cancellation: Unable to Maintain Gaze, inconsistent ability to keep eyes on target  Head-Impulse Test: HIT Right: negative HIT Left: negative -- had a difficult time keeping eyes on target in both directions  Dynamic Visual Acuity:  would benefit from future testing    POSITIONAL TESTING: Right Dix-Hallpike:  upbeating, right nystagmus Left Dix-Hallpike: benefit from testing at next visit Right Roll Test: no nystagmus Left Roll Test: no nystagmus Left Sidelying: no nystagmus  MOTION SENSITIVITY:  May benefit from testing in future visits                                                                                                   TREATMENT DATE:    07/12/23   Therapy session focused on re-assessment of standardized outcome measures to assess pt's progress with therapy thus far.  Unless otherwise stated, CGA/SBA was provided and gait belt donned in order to ensure pt safety throughout session.  : 89' with rollator  5xSTS (with no UE support): 34.9 to complete 3 without UE support - unable to complete 5 ABC: 45.6%  Pt educated on results of re-assessment today with improvements noted; however, continuing to indicate high fall risk.  Sit to stand  x 10 from green chair with 3# weights with overhead press  Seated hip abduction with RTB 2  x 10  Seated LAQ 2 x 10 with 3 second hold each LE   Seated hamstring curls with RTB 2 x 10 each LE    PATIENT EDUCATION:  Education details: PT POC, balance impairments and increased fall risk based on outcome measures, HEP Person educated: Patient and Spouse Education method: Explanation Education comprehension: verbalized understanding and needs further education  HOME EXERCISE PROGRAM:  Access Code: [email or web address] URL: [email or web address] Date: 06/04/2023 Prepared by: Casimiro Needle  Exercises - Walking with Rollator - 1 x daily - 7 x weekly - 3-5 sets - 3 minutes hold - Sit to Stand with Hands on Knees  - 1 x daily - 7 x weekly - 2 sets - 10 reps - Standing March with Counter Support  - 1 x daily - 7 x weekly - 2 sets - 10 reps  GOALS: Goals reviewed with patient? Yes  SHORT TERM GOALS: Target date: 07/09/2023   Patient will be independent in home exercise program to improve strength/mobility for better functional  independence with ADLs. Baseline: provided on 06/04/2023 07/09/23: pt reports performing, will need to progress Goal status: IN PROGRESS  2. Patient will improve ABC scale by 20% indicating increased confidence performing daily mobility tasks, which is associated with fall risk.    Baseline: 05/28/2023 = 17.5%;   07/09/23: need to re-assess; 3/28: 45.6%   Goal Status: MET   LONG TERM GOALS: Target date: 08/20/2023   Patient (> 66 years old) will complete five times sit to stand test in < 15 seconds indicating an increased LE strength and improved balance. Baseline: 06/04/2023: 59.25 seconds 07/09/23: 20.27 seconds using UE support on armrests 3/28: unable to complete 5 sit to stands without UE support - stopped at 3 @  34.9 seconds  Goal status: IN PROGRESS  Patient will increase Berg Balance score by > 6 points to demonstrate decreased fall risk during functional activities. Baseline: 06/04/2023: 31/56 07/09/23: 38/56 Goal Updated: Will increase Berg Balance score to >45/56 Goal status: MET and Revised on 07/09/2023   Patient will reduce timed up and go to <11 seconds to reduce fall risk and demonstrate improved transfer/gait ability. Baseline: 05/28/2023 = 22.25sec using rollator vs 24.90 seconds without AD 07/09/23: 18.35 seconds using rollator and 19.07 sec without AD Goal status: IN PROGRESS  Patient will increase 10 meter walk test to >0.33m/s as to improve gait speed for better community ambulation and to reduce fall risk. Baseline: 05/28/2023 = 0.45m/s 07/09/23: 0.59 m/s (16.90 seconds) using rollator & 0.43 m/s (22.89 seconds) without AD Goal status: IN PROGRESS  Patient will increase 3 minute walk test distance to >387ft for progression to limited community ambulation and improved gait ability Baseline: 06/04/2023 = 285ft (80.74meters) using rollator 07/09/23: need to re-assess 3/28: 290' using rollator  Goal status: IN PROGRESS   ASSESSMENT:  CLINICAL IMPRESSION:  Session  focused on completing reassessment with improvement in ABC score and . Pt unable to complete 5xSTS today without UE support demonstrating functional B LE weakness impacting her ability to complete transfers, gait, and stair navigation. Continues to make slow progress towards goals. Mrs. Mabin will benefit from continued skilled PT to improve these deficits in order to increase QOL, ease/safety with ADLs, decrease fall risk, and increase independence with functional mobility at household and community level. Patient's condition has the potential to improve in response to therapy. Maximum improvement is yet to be obtained. The anticipated improvement is attainable and reasonable in a generally predictable time.     OBJECTIVE  IMPAIRMENTS: Abnormal gait, cardiopulmonary status limiting activity, decreased activity tolerance, decreased balance, decreased endurance, decreased mobility, difficulty walking, and decreased strength.   ACTIVITY LIMITATIONS: carrying, lifting, bending, standing, squatting, stairs, transfers, bed mobility, reach over head, and locomotion level  PARTICIPATION LIMITATIONS: meal prep, cleaning, laundry, shopping, and community activity  PERSONAL FACTORS: Age, Behavior pattern, Fitness, Time since onset of injury/illness/exacerbation, and 3+ comorbidities: Parkinsonism, anxiety, arthritis, depression, HTN  are also affecting patient's functional outcome.   REHAB POTENTIAL: Good  CLINICAL DECISION MAKING: Evolving/moderate complexity  EVALUATION COMPLEXITY: Moderate  PLAN:  PT FREQUENCY: 1-2x/week  PT DURATION: 12 weeks  PLANNED INTERVENTIONS: 97164- PT Re-evaluation, 97110-Therapeutic exercises, 97530- Therapeutic activity, 97112- Neuromuscular re-education, 97535- Self Care, [id number]- Manual therapy, [phone]- Gait training, [phone]- Canalith repositioning, Patient/Family education, Balance training, Stair training, Vestibular training, Visual/preceptual remediation/compensation,  Cognitive remediation, and DME instructions   PLAN FOR NEXT SESSION:  - progress note on 07/09/2023  - didn't get to 3 min walk test, 5xSTS without UE support, nor ABC scale - Nustep - follow-up on vestibular treatment from 06/28/2023 if needed - B LE functional strengthening   - adding weights/resistance training as appropriate   - continue bridges - dynamic balance and gait challenges with gaze stabilization dual-task challenges  - turning and stepping in different directions over small obstacles - dynamic reaching with turning and stepping - lateral side stepping, backwards stepping in // bars *pt very fearful of falling with balance interventions*   Viviann Spare, PT, DPT Physical Therapist - [facility]  [facility]  10:22 AM 07/12/23

## 2023-07-12 ENCOUNTER — Ambulatory Visit: Payer: Medicare Other

## 2023-07-12 ENCOUNTER — Encounter: Payer: Medicare Other | Admitting: Speech Pathology

## 2023-07-12 ENCOUNTER — Encounter: Payer: Self-pay | Admitting: Physical Therapy

## 2023-07-12 DIAGNOSIS — R262 Difficulty in walking, not elsewhere classified: Secondary | ICD-10-CM | POA: Diagnosis not present

## 2023-07-12 DIAGNOSIS — R296 Repeated falls: Secondary | ICD-10-CM | POA: Diagnosis not present

## 2023-07-12 DIAGNOSIS — M6281 Muscle weakness (generalized): Secondary | ICD-10-CM | POA: Diagnosis not present

## 2023-07-12 DIAGNOSIS — R278 Other lack of coordination: Secondary | ICD-10-CM | POA: Diagnosis not present

## 2023-07-12 DIAGNOSIS — R42 Dizziness and giddiness: Secondary | ICD-10-CM | POA: Diagnosis not present

## 2023-07-15 NOTE — Therapy (Signed)
 OUTPATIENT PHYSICAL THERAPY NEURO TREATMENT   Patient Name: Brittany Benton MRN: [id number] DOB:10/28/1955, 68 y.o., female Today's Date: 07/16/2023   PCP: Malva Limes, MD REFERRING PROVIDER: Lonell Face, MD   END OF SESSION:    PT End of Session - 07/16/23 1317     Visit Number 12    Number of Visits 24    Date for PT Re-Evaluation 08/20/23    Authorization Type [employer] 2025 - auth 2/18-4/15 for 16 visits    PT Start Time 1317    PT Stop Time 1357    PT Time Calculation (min) 40 min    Equipment Utilized During Treatment Gait belt    Activity Tolerance Patient tolerated treatment well;No increased pain    Behavior During Therapy [facility] for tasks assessed/performed                Past Medical History:  Diagnosis Date   Allergy 1989   Anemia    Anxiety    Arthritis    osteoarthritis -right hip   Cataract    Depression    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   GERD (gastroesophageal reflux disease)    Hernia, incisional    after renal surgery   History of chicken pox    Hyperlipidemia    Hypertension    Renal cell carcinoma (HCC)    s/p right nephrectomy 1994   Sleep apnea    Thyroid disease    Past Surgical History:  Procedure Laterality Date   APPENDECTOMY  1994   BIOPSY  11/07/2022   Procedure: BIOPSY;  Surgeon: Wyline Mood, MD;  Location: [facility] ENDOSCOPY;  Service: Gastroenterology;;   BREAST BIOPSY Right 1991   Negative   CHOLECYSTECTOMY  1994   COLONOSCOPY WITH PROPOFOL N/A 11/07/2022   Procedure: COLONOSCOPY WITH PROPOFOL;  Surgeon: Wyline Mood, MD;  Location: [facility] ENDOSCOPY;  Service: Gastroenterology;  Laterality: N/A;   ESOPHAGOGASTRODUODENOSCOPY (EGD) WITH PROPOFOL N/A 11/07/2022   Procedure: ESOPHAGOGASTRODUODENOSCOPY (EGD) WITH PROPOFOL;  Surgeon: Wyline Mood, MD;  Location: [facility] ENDOSCOPY;  Service: Gastroenterology;  Laterality: N/A;   JOINT REPLACEMENT  2019   LAPAROSCOPIC GASTRIC SLEEVE RESECTION  02/02/2016   Dr  Alva Garnet at [facility]   NEPHRECTOMY  1994   Renal Cell Carcinoma   parotid gland removal  1990   also removed a tumor   POLYPECTOMY  11/07/2022   Procedure: POLYPECTOMY INTESTINAL;  Surgeon: Wyline Mood, MD;  Location: [facility] ENDOSCOPY;  Service: Gastroenterology;;   TONSILLECTOMY     TOTAL HIP ARTHROPLASTY Right 08/08/2016   Procedure: TOTAL HIP ARTHROPLASTY;  Surgeon: Deeann Saint, MD;  Location: [facility] ORS;  Service: Orthopedics;  Laterality: Right;   TUBAL LIGATION  1979   Patient Active Problem List   Diagnosis Date Noted   B12 deficiency 06/11/2022   Iron deficiency anemia 04/10/2022   Morbid obesity (HCC) 03/30/2022   Vitamin A deficiency 08/15/2021   Parkinson's disease (HCC) 08/03/2021   Acute urinary retention 08/03/2021   Meningioma (HCC) 04/05/2021   Hypothyroidism 08/15/2020   Osteopenia 06/14/2020   Status post total hip replacement, 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   Osteoarthritis of right hip 03/26/2016   H/O gastric bypass 03/26/2016   Sinus tachycardia 08/01/2015   Obesity (BMI 30-39.9) 06/08/2015   OSA (obstructive sleep apnea) 06/08/2015   GERD without esophagitis 06/08/2015   Incisional hernia 06/08/2015   Arthritis of hip 03/23/2015   Left knee pain 02/15/2015   Vitamin D deficiency 09/29/2014   Lump of right breast 09/22/2014  Prediabetes 09/22/2014   Pruritic dermatitis 09/22/2014   Obstructive sleep apnea 09/22/2014   Allergic rhinitis 09/15/2009   Hypersomnia 09/14/2009   Panic disorder 08/06/2008   Essential (primary) hypertension 07/16/2007   Hyperlipidemia 07/16/2007    ONSET DATE: Approximately 2 years ago  REFERRING DIAG: R26.89 (ICD-10-CM) - Balance problem   THERAPY DIAG:  Difficulty in walking, not elsewhere classified  Muscle weakness (generalized)  Repeated falls  Other lack of coordination  Rationale for Evaluation and Treatment: Rehabilitation  SUBJECTIVE:                                                                                                                                                                                              SUBJECTIVE STATEMENT:   Patient reports she is having a great deal of difficulty walking this date and feels like her legs are going to buckle  Pt accompanied by: self and family member (sister in waiting area)  PERTINENT HISTORY: Pt is a 68y.o. female with concern for Parkinsonism due to resting tremors (R>L) with occasional whole-body jerks/myoclonus, bradykinesia, hallucinations while waking up, bilateral bradykinesia (L>R), minimal cogwheeling bilaterally, sensation of stiffness, speech changes, and imbalance (trialed Carbidopa-Levodopa, but intolerable due to side effects of severe nausea & vomiting). Pt with hx of frequent falls and bilateral LE weakness. Pt reports hx of kidney removal on R side with large abdominal incision and R hip replacement.  PAIN:  Are you having pain? No  PRECAUTIONS: Fall  RED FLAGS: None   WEIGHT BEARING RESTRICTIONS: No  FALLS: Has patient fallen in last 6 months? Yes. Number of falls 5-6 (2 in driveway, 1 on sidewalk)  Most recent fall last Thursday 05/23/2023, pt reports she became "disoriented" and had LOB, but no injuries- pt states in general she is "very easily distracted and then she will misstep causing her to have LOB"  LIVING ENVIRONMENT: Lives with: lives with their spouse [name]) Lives in: House/apartment Stairs: Yes: Internal: reports she doesn't have to go to different floors in house (4 steps inside); and External: 3-4 steps to enter with R HRs + courtyard + additional 4-5 steps onto porch with B HRs -  entrance has steep driveway Has following equipment at home: Dan Humphreys - 4 wheeled, shower bench, grabbars in bathroom, bedrail, and Ramped entry from garage (but has to go up steep driveway)  PLOF: Independent with basic ADLs, Independent with household mobility with device, Independent with household mobility without device, and Requires  assistive device for independence Reports started using rollator more consistently ~2-3weeks ago but has used it on/off for a while  PATIENT GOALS: Wants to feel  comfortable moving around and be shown how to do that again  OBJECTIVE:  Note: Objective measures were completed at Evaluation unless otherwise noted.  DIAGNOSTIC FINDINGS:   CT Head without Contrast 04/03/2021: Chronic atrophic changes without acute abnormality.   MRI Brain without Contrast 04/04/2021: 1. No acute intracranial abnormality. 2. Mild age-related cerebral atrophy with chronic small vessel ischemic disease. 3. 1.1 cm meningioma overlying the left frontal convexity without associated mass effect or edema.   CT Cervical Spine without Contrast 04/03/2021: Moderate severity multilevel degenerative changes, most prominent at the levels of C4-C5, C5-C6 and C6-C7. No evidence of an acute fracture or subluxation.   COGNITION: Overall cognitive status: Within functional limits for tasks assessed   SENSATION: WFL  COORDINATION: WFL for basic functional mobility tasks  EDEMA:  None observed  MUSCLE TONE: WNL  MUSCLE LENGTH: Not tested  DTRs:  Not tested  POSTURE: rounded shoulders, forward head, and increased lumbar lordosis  LOWER EXTREMITY ROM:     Passive  All WFL for basic functional mobility tasks Right Eval Left Eval  Hip flexion    Hip extension    Hip abduction    Hip adduction    Hip internal rotation    Hip external rotation    Knee flexion    Knee extension    Ankle dorsiflexion    Ankle plantarflexion    Ankle inversion    Ankle eversion     (Blank rows = not tested)  LOWER EXTREMITY MMT:    MMT Right Eval Left Eval  Hip flexion 3+ 4  Hip extension    Hip abduction    Hip adduction    Hip internal rotation    Hip external rotation    Knee flexion 4- 4-  Knee extension 4 4  Ankle dorsiflexion 4 4-  Ankle plantarflexion 4- 4-  Ankle inversion    Ankle eversion    (Blank  rows = not tested)  Manual Muscle Test Scale 0/5 = No muscle contraction can be seen or felt 1/5 = Contraction can be felt, but there is no motion 2-/5 = Part moves through incomplete ROM w/ gravity decreased 2/5 = Part moves through complete ROM w/ gravity decreased 2+/5 = Part moves through incomplete ROM (<50%) against gravity or through complete ROM w/ gravity 3-/5 = Part moves through incomplete ROM (>50%) against gravity 3/5 = Part moves through complete ROM against gravity 3+/5 = Part moves through complete ROM against gravity/slight resistance 4-/5= Holds test position against slight to moderate pressure 4/5 = Part moves through complete ROM against gravity/moderate resistance 4+/5= Holds test position against moderate to strong pressure 5/5 = Part moves through complete ROM against gravity/full resistance  BED MOBILITY:  Sit to supine Min A and requires used of bedrail Supine to sit Min A and requires used of bedrail Rolling to Right Min A Rolling to Left Min A Relies on bedrial, difficulty with lateral scooting in the bed (or in sitting)  TRANSFERS: Assistive device utilized: Environmental consultant - 4 wheeled  Sit to stand: SBA and CGA Stand to sit: SBA and CGA Chair to chair: SBA and CGA Without AD requires more consistent CGA/light min A Floor:  not assessed  RAMP:  Assistive device utilized: Environmental consultant - 4 wheeled Ramp Comments: would benefit from assessment of this when able due to steep driveway at home  CURB:   Curb Comments: Not assessed  STAIRS: Level of Assistance: CGA Stair Negotiation Technique: Step to Pattern with Bilateral Rails ascending with  R LE and descending with L LE Number of Stairs: 4  Height of Stairs: 6in  Comments: Pt reports feeling more confident when having B HR support  GAIT: Gait pattern: step through pattern, decreased step length- Right, decreased step length- Left, decreased stride length, shuffling, trendelenburg, lateral hip instability,  lateral lean- Right, lateral lean- Left, wide BOS, poor foot clearance- Right, and poor foot clearance- Left --- R/L lateral trunk lean over stance limb, decreased step lengths bilaterally, partial shuffled gait, wide BOS, increased balance instability Distance walked: ~39ft using HHA progressed to no UE support Assistive device utilized:  HHA progressed to no UE support Level of assistance: CGA and Min A Comments: Slow gait speed  FUNCTIONAL TESTS:  5 times sit to stand: on 06/04/2023: 59.25 seconds Timed up and go (TUG): 24.90sec without AD, 22.25 with rollator 3 minute walk test: on 06/04/2023: 23ft using rollator = 80.72meters 10 meter walk test: 36.75 seconds = 0.42m/s without AD; 06/04/23 0.53m/s average gait speed during 3 minute walk test using rollator  Berg Balance Scale: on 06/04/2023 = 31/56 Would benefit from Modified CTSIB & FGA  PATIENT SURVEYS:  ABC scale 17.5%  Details: patient 0% confident with majority of items; 50% confident household level ambulation, 40% stairs, 50% sweeping floor, 50% getting in/out of car, and 90% confident ambulating up/down ramp with rails    Vestibular Assessment  - performed on 06/07/2023 - re-assessed on 2/25 with details in that treatment note  OCULOMOTOR EXAM:  Ocular Alignment: normal  Ocular ROM: No Limitations  Spontaneous Nystagmus: absent  Gaze-Induced Nystagmus: absent and continues to have difficulty keeping eyes on target, especially towards L  Smooth Pursuits:  difficulty keeping eyes on target  Saccades: hypometric/undershoots, extra eye movements, and difficulty finding target on L side  Convergence/Divergence: not assessed Cover/Cross Cover: WNL  VESTIBULAR - OCULAR REFLEX:   VOR Cancellation: Unable to Maintain Gaze, inconsistent ability to keep eyes on target  Head-Impulse Test: HIT Right: negative HIT Left: negative -- had a difficult time keeping eyes on target in both directions  Dynamic Visual Acuity:  would benefit from  future testing    POSITIONAL TESTING: Right Dix-Hallpike: upbeating, right nystagmus Left Dix-Hallpike: benefit from testing at next visit Right Roll Test: no nystagmus Left Roll Test: no nystagmus Left Sidelying: no nystagmus  MOTION SENSITIVITY:  May benefit from testing in future visits                                                                                                   TREATMENT DATE:     07/16/23   Unless otherwise stated, CGA/SBA was provided and gait belt donned in order to ensure pt safety throughout session.  Standing hip abduction with B UE support x 10 each LE  Standing hip extension with BUE support x 10 each LE  Standing on airex with narrow BOS x 30 seconds  -addition of head turns x 30 seconds - patient terminates exercise due to fatigue  Seated LAQ x 15 each LE with 3 second hold - 2# AW  Seated marching x 15 each  LE -2# AW  Seated heel  raises x 15 each LE 2# AW   Sit to stand 2 x 10 from green chair seated on airex with 3# weights with bicep curls on first set and overhead press with second set  (unable to stand from green chair alone)    PATIENT EDUCATION:  Education details: PT POC, balance impairments and increased fall risk based on outcome measures, HEP Person educated: Patient and Spouse Education method: Explanation Education comprehension: verbalized understanding and needs further education  HOME EXERCISE PROGRAM:  Access Code: [email or web address] URL: [email or web address] Date: 06/04/2023 Prepared by: Casimiro Needle  Exercises - Walking with Rollator - 1 x daily - 7 x weekly - 3-5 sets - 3 minutes hold - Sit to Stand with Hands on Knees  - 1 x daily - 7 x weekly - 2 sets - 10 reps - Standing March with Counter Support  - 1 x daily - 7 x weekly - 2 sets - 10 reps  GOALS: Goals reviewed with patient? Yes  SHORT TERM GOALS: Target date: 07/09/2023   Patient will be independent in home exercise program to improve  strength/mobility for better functional independence with ADLs. Baseline: provided on 06/04/2023 07/09/23: pt reports performing, will need to progress Goal status: IN PROGRESS  2. Patient will improve ABC scale by 20% indicating increased confidence performing daily mobility tasks, which is associated with fall risk.    Baseline: 05/28/2023 = 17.5%;   07/09/23: need to re-assess; 3/28: 45.6%   Goal Status: MET   LONG TERM GOALS: Target date: 08/20/2023   Patient (> 59 years old) will complete five times sit to stand test in < 15 seconds indicating an increased LE strength and improved balance. Baseline: 06/04/2023: 59.25 seconds 07/09/23: 20.27 seconds using UE support on armrests 3/28: unable to complete 5 sit to stands without UE support - stopped at 3 @  34.9 seconds  Goal status: IN PROGRESS  Patient will increase Berg Balance score by > 6 points to demonstrate decreased fall risk during functional activities. Baseline: 06/04/2023: 31/56 07/09/23: 38/56 Goal Updated: Will increase Berg Balance score to >45/56 Goal status: MET and Revised on 07/09/2023   Patient will reduce timed up and go to <11 seconds to reduce fall risk and demonstrate improved transfer/gait ability. Baseline: 05/28/2023 = 22.25sec using rollator vs 24.90 seconds without AD 07/09/23: 18.35 seconds using rollator and 19.07 sec without AD Goal status: IN PROGRESS  Patient will increase 10 meter walk test to >0.51m/s as to improve gait speed for better community ambulation and to reduce fall risk. Baseline: 05/28/2023 = 0.81m/s 07/09/23: 0.59 m/s (16.90 seconds) using rollator & 0.43 m/s (22.89 seconds) without AD Goal status: IN PROGRESS  Patient will increase 3 minute walk test distance to >379ft for progression to limited community ambulation and improved gait ability Baseline: 06/04/2023 = 2100ft (80.33meters) using rollator 07/09/23: need to re-assess 3/28: 290' using rollator  Goal status: IN  PROGRESS   ASSESSMENT:  CLINICAL IMPRESSION:   Session focused on BLE strengthening in standing and sitting. Patient complaining of increased fatigue with all activities. Mrs. Duffell will benefit from continued skilled PT to improve these deficits in order to increase QOL, ease/safety with ADLs, decrease fall risk, and increase independence with functional mobility at household and community level.    OBJECTIVE IMPAIRMENTS: Abnormal gait, cardiopulmonary status limiting activity, decreased activity tolerance, decreased balance, decreased endurance, decreased mobility, difficulty walking, and decreased strength.   ACTIVITY LIMITATIONS: carrying, lifting, bending, standing, squatting, stairs,  transfers, bed mobility, reach over head, and locomotion level  PARTICIPATION LIMITATIONS: meal prep, cleaning, laundry, shopping, and community activity  PERSONAL FACTORS: Age, Behavior pattern, Fitness, Time since onset of injury/illness/exacerbation, and 3+ comorbidities: Parkinsonism, anxiety, arthritis, depression, HTN  are also affecting patient's functional outcome.   REHAB POTENTIAL: Good  CLINICAL DECISION MAKING: Evolving/moderate complexity  EVALUATION COMPLEXITY: Moderate  PLAN:  PT FREQUENCY: 1-2x/week  PT DURATION: 12 weeks  PLANNED INTERVENTIONS: 97164- PT Re-evaluation, 97110-Therapeutic exercises, 97530- Therapeutic activity, 97112- Neuromuscular re-education, 97535- Self Care, 16109- Manual therapy, [phone]- Gait training, [phone]- Canalith repositioning, Patient/Family education, Balance training, Stair training, Vestibular training, Visual/preceptual remediation/compensation, Cognitive remediation, and DME instructions   PLAN FOR NEXT SESSION:  - Nustep - follow-up on vestibular treatment from 06/28/2023 if needed - B LE functional strengthening   - adding weights/resistance training as appropriate   - continue bridges - dynamic balance and gait challenges with gaze stabilization  dual-task challenges  - turning and stepping in different directions over small obstacles - dynamic reaching with turning and stepping - lateral side stepping, backwards stepping in // bars *pt very fearful of falling with balance interventions*   Viviann Spare, PT, DPT Physical Therapist - [facility]  [facility]  1:18 PM 07/16/23

## 2023-07-16 ENCOUNTER — Encounter: Payer: Medicare Other | Admitting: Speech Pathology

## 2023-07-16 ENCOUNTER — Ambulatory Visit: Payer: Medicare Other | Attending: Neurology

## 2023-07-16 ENCOUNTER — Encounter: Payer: Self-pay | Admitting: Physical Therapy

## 2023-07-16 DIAGNOSIS — R278 Other lack of coordination: Secondary | ICD-10-CM | POA: Diagnosis not present

## 2023-07-16 DIAGNOSIS — M6281 Muscle weakness (generalized): Secondary | ICD-10-CM | POA: Diagnosis not present

## 2023-07-16 DIAGNOSIS — R296 Repeated falls: Secondary | ICD-10-CM | POA: Diagnosis not present

## 2023-07-16 DIAGNOSIS — R42 Dizziness and giddiness: Secondary | ICD-10-CM | POA: Diagnosis not present

## 2023-07-16 DIAGNOSIS — R262 Difficulty in walking, not elsewhere classified: Secondary | ICD-10-CM | POA: Diagnosis not present

## 2023-07-18 ENCOUNTER — Ambulatory Visit: Payer: Medicare Other | Admitting: Physical Therapy

## 2023-07-18 ENCOUNTER — Telehealth: Payer: Self-pay | Admitting: Physical Therapy

## 2023-07-18 ENCOUNTER — Encounter: Payer: Medicare Other | Admitting: Speech Pathology

## 2023-07-18 NOTE — Telephone Encounter (Signed)
 Pt contacted via telephone and author left voice mail informing of missed appointment and informed pt of future PT appointment date and time.   Casimiro Needle, PT, DPT, NCS, CSRS

## 2023-07-19 ENCOUNTER — Encounter: Payer: Self-pay | Admitting: Family Medicine

## 2023-07-19 ENCOUNTER — Ambulatory Visit (INDEPENDENT_AMBULATORY_CARE_PROVIDER_SITE_OTHER): Admitting: Family Medicine

## 2023-07-19 VITALS — BP 118/64 | HR 96 | Ht 60.0 in | Wt 181.9 lb

## 2023-07-19 DIAGNOSIS — E039 Hypothyroidism, unspecified: Secondary | ICD-10-CM

## 2023-07-19 DIAGNOSIS — G20A1 Parkinson's disease without dyskinesia, without mention of fluctuations: Secondary | ICD-10-CM | POA: Diagnosis not present

## 2023-07-19 DIAGNOSIS — E559 Vitamin D deficiency, unspecified: Secondary | ICD-10-CM | POA: Diagnosis not present

## 2023-07-19 DIAGNOSIS — D509 Iron deficiency anemia, unspecified: Secondary | ICD-10-CM

## 2023-07-19 DIAGNOSIS — E785 Hyperlipidemia, unspecified: Secondary | ICD-10-CM | POA: Diagnosis not present

## 2023-07-19 DIAGNOSIS — M8580 Other specified disorders of bone density and structure, unspecified site: Secondary | ICD-10-CM | POA: Diagnosis not present

## 2023-07-19 DIAGNOSIS — E538 Deficiency of other specified B group vitamins: Secondary | ICD-10-CM

## 2023-07-19 DIAGNOSIS — I10 Essential (primary) hypertension: Secondary | ICD-10-CM | POA: Diagnosis not present

## 2023-07-19 DIAGNOSIS — R7303 Prediabetes: Secondary | ICD-10-CM | POA: Diagnosis not present

## 2023-07-19 NOTE — Patient Instructions (Signed)
 Marland Kitchen  Please review the attached list of medications and notify my office if there are any errors.   . Please bring all of your medications to every appointment so we can make sure that our medication list is the same as yours.

## 2023-07-19 NOTE — Progress Notes (Signed)
 Established patient visit   Patient: Brittany Benton   DOB: [birth year]   68 y.o. Female  MRN: [id number] Visit Date: 07/19/2023  Today's healthcare provider: Mila Merry, MD   Chief Complaint  Patient presents with   Medical Management of Chronic Issues   Medication Refill    Needs Lipitor refilled   Subjective    Discussed the use of AI scribe software for clinical note transcription with the patient, who gave verbal consent to proceed.  History of Present Illness   Brittany Benton is a 68 year old female with Parkinson's disease who presents with for follow up lipids, hypertension, anxiety,   and prediabetes.  She is experiencing progressive symptoms related to Parkinson's disease, including very limited walking ability. She is currently undergoing physical therapy to address these mobility issues. Additionally, she has difficulty with concentration and maintaining a 'straight thought,' which are contributing to her cognitive challenges.  She has a history of falling frequently, although she usually manages to catch herself. She attributes some of these falls to being distracted, which is a concern given her Parkinson's disease. She states she has been seeing chiropractor and told that she has osteoporosis. it has been several years since her last BMD.   No trouble with breathing or shortness of breath, but she sometimes blows air out for a minute or two, usually when stressed. She also reports a persistent dry mouth, which she associates with past issues related to potassium and magnesium levels.  Her husband, who is at home with her, is undergoing treatment for stage four lymphoma, which adds to their busy home life.     Lab Results  Component Value Date   HGBA1C 5.8 (H) 03/30/2022   HGBA1C 4.9 05/22/2021   HGBA1C 5.7 (H) 08/04/2020   Lab Results  Component Value Date   NA 143 09/05/2021   K 4.1 09/05/2021   CREATININE 0.82 09/05/2021   EGFR 78 09/05/2021   GLUCOSE  98 09/05/2021   Lab Results  Component Value Date   CHOL 120 08/04/2020   HDL 45 08/04/2020   LDLCALC 55 08/04/2020   TRIG 111 08/04/2020   CHOLHDL 2.7 08/04/2020   Lab Results  Component Value Date   VITAMINB12 253 04/12/2023   Lab Results  Component Value Date   VD25OH 41.2 09/05/2021   Lab Results  Component Value Date   IRON 38 04/12/2023   TIBC 409 04/12/2023   FERRITIN 34 04/12/2023     Medications: Outpatient Medications Prior to Visit  Medication Sig Note   Ascorbic Acid (VITAMIN C PO) Take by mouth.    atorvastatin (LIPITOR) 20 MG tablet Take 1 tablet (20 mg total) by mouth every evening. TAKE 1 TABLET BY MOUTH EVERY EVENING 07/19/2023: Needs refill   Carbidopa-Levodopa ER (SINEMET CR) 25-100 MG tablet controlled release Take 1 tablet by mouth 2 (two) times daily.    Cholecalciferol (VITAMIN D-3) 1000 units CAPS Take 1 capsule by mouth daily.    imipramine (TOFRANIL) 50 MG tablet TAKE ONE TABLET BY MOUTH TWICE DAILY    levothyroxine (SYNTHROID) 50 MCG tablet TAKE ONE TABLET BY MOUTH EVERY DAY    metoprolol succinate (TOPROL-XL) 50 MG 24 hr tablet TAKE ONE TABLET BY MOUTH EVERY DAY WITH OR IMMEDIATELY FOLLOWING A MEAL    Multiple Vitamins-Minerals (MULTIVITAMIN ADULT PO) Take 1 tablet by mouth daily.    omeprazole (PRILOSEC OTC) 20 MG tablet Take 20 mg by mouth daily.    omeprazole (PRILOSEC)  20 MG capsule TAKE 1 CAPSULE BY MOUTH ONCE DAILY    sertraline (ZOLOFT) 100 MG tablet TAKE ONE TABLET BY MOUTH DAILY    No facility-administered medications prior to visit.   Review of Systems     Objective    BP 118/64   Pulse 96   Ht 5' (1.524 m)   Wt 181 lb 14.4 oz (82.5 kg)   SpO2 100%   BMI 35.52 kg/m   Physical Exam   General: Appearance:    Obese female in no acute distress  Eyes:    PERRL, conjunctiva/corneas clear, EOM's intact       Lungs:     Clear to auscultation bilaterally, respirations unlabored  Heart:    Normal heart rate. Normal rhythm. No  murmurs, rubs, or gallops.    MS:   All extremities are intact.    Neurologic:   Awake, alert, oriented x 3. No apparent focal neurological defect.         Assessment & Plan    1. Essential (primary) hypertension (Primary) Well controlled.  Continue current medications.    2. B12 deficiency  - Vitamin B12  3. Vitamin D deficiency   4. Hypothyroidism, unspecified type  - TSH - T4, free  5. Prediabetes  - Hemoglobin A1c  6. Parkinson's disease, unspecified whether dyskinesia present, unspecified whether manifestations fluctuate (HCC) Tolerating current medications well, prescribed by Dr. Sherryll Burger.   7. Hyperlipidemia, unspecified hyperlipidemia type She is tolerating atorvastatin well with no adverse effects.   - CBC - Comprehensive metabolic panel with GFR - Lipid panel  8. Osteopenia, unspecified location Per report to patient from her chiropractor, is overdue for BMD for estrogen deficiency.  - DG Bone Density; Future  9. Iron deficiency anemia, unspecified iron deficiency anemia type  - Iron, TIBC and Ferritin Panel      Mila Merry, MD  [facility] [phone] (phone) [phone] (fax)  [facility]

## 2023-07-23 ENCOUNTER — Ambulatory Visit: Payer: Medicare Other | Admitting: Physical Therapy

## 2023-07-23 ENCOUNTER — Encounter: Payer: Medicare Other | Admitting: Speech Pathology

## 2023-07-23 DIAGNOSIS — E785 Hyperlipidemia, unspecified: Secondary | ICD-10-CM | POA: Diagnosis not present

## 2023-07-23 DIAGNOSIS — R278 Other lack of coordination: Secondary | ICD-10-CM | POA: Diagnosis not present

## 2023-07-23 DIAGNOSIS — E039 Hypothyroidism, unspecified: Secondary | ICD-10-CM | POA: Diagnosis not present

## 2023-07-23 DIAGNOSIS — R42 Dizziness and giddiness: Secondary | ICD-10-CM

## 2023-07-23 DIAGNOSIS — R262 Difficulty in walking, not elsewhere classified: Secondary | ICD-10-CM | POA: Diagnosis not present

## 2023-07-23 DIAGNOSIS — R296 Repeated falls: Secondary | ICD-10-CM

## 2023-07-23 DIAGNOSIS — E538 Deficiency of other specified B group vitamins: Secondary | ICD-10-CM | POA: Diagnosis not present

## 2023-07-23 DIAGNOSIS — R7303 Prediabetes: Secondary | ICD-10-CM | POA: Diagnosis not present

## 2023-07-23 DIAGNOSIS — M6281 Muscle weakness (generalized): Secondary | ICD-10-CM | POA: Diagnosis not present

## 2023-07-23 DIAGNOSIS — D509 Iron deficiency anemia, unspecified: Secondary | ICD-10-CM | POA: Diagnosis not present

## 2023-07-23 NOTE — Therapy (Signed)
 OUTPATIENT PHYSICAL THERAPY NEURO TREATMENT   Patient Name: Brittany Benton MRN: [id number] DOB:06/18/1955, 68 y.o., female Today's Date: 07/23/2023   PCP: Malva Limes, MD REFERRING PROVIDER: Lonell Face, MD   END OF SESSION:    PT End of Session - 07/23/23 1325     Visit Number 13    Number of Visits 24    Date for PT Re-Evaluation 08/20/23    Authorization Type [employer] 2025 - auth 2/18-4/15 for 16 visits    PT Start Time 1321    PT Stop Time 1400    PT Time Calculation (min) 39 min    Equipment Utilized During Treatment Gait belt    Activity Tolerance Patient tolerated treatment well;No increased pain    Behavior During Therapy [facility] for tasks assessed/performed             Past Medical History:  Diagnosis Date   Allergy 1989   Anemia    Anxiety    Arthritis    osteoarthritis -right hip   Cataract    Depression    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   GERD (gastroesophageal reflux disease)    Hernia, incisional    after renal surgery   History of chicken pox    Hyperlipidemia    Hypertension    Renal cell carcinoma (HCC)    s/p right nephrectomy 1994   Sleep apnea    Thyroid disease    Past Surgical History:  Procedure Laterality Date   APPENDECTOMY  1994   BIOPSY  11/07/2022   Procedure: BIOPSY;  Surgeon: Wyline Mood, MD;  Location: [facility] ENDOSCOPY;  Service: Gastroenterology;;   BREAST BIOPSY Right 1991   Negative   CHOLECYSTECTOMY  1994   COLONOSCOPY WITH PROPOFOL N/A 11/07/2022   Procedure: COLONOSCOPY WITH PROPOFOL;  Surgeon: Wyline Mood, MD;  Location: [facility] ENDOSCOPY;  Service: Gastroenterology;  Laterality: N/A;   ESOPHAGOGASTRODUODENOSCOPY (EGD) WITH PROPOFOL N/A 11/07/2022   Procedure: ESOPHAGOGASTRODUODENOSCOPY (EGD) WITH PROPOFOL;  Surgeon: Wyline Mood, MD;  Location: [facility] ENDOSCOPY;  Service: Gastroenterology;  Laterality: N/A;   JOINT REPLACEMENT  2019   LAPAROSCOPIC GASTRIC SLEEVE RESECTION  02/02/2016   Dr Alva Garnet at  [facility]   NEPHRECTOMY  1994   Renal Cell Carcinoma   parotid gland removal  1990   also removed a tumor   POLYPECTOMY  11/07/2022   Procedure: POLYPECTOMY INTESTINAL;  Surgeon: Wyline Mood, MD;  Location: [facility] ENDOSCOPY;  Service: Gastroenterology;;   TONSILLECTOMY     TOTAL HIP ARTHROPLASTY Right 08/08/2016   Procedure: TOTAL HIP ARTHROPLASTY;  Surgeon: Deeann Saint, MD;  Location: [facility] ORS;  Service: Orthopedics;  Laterality: Right;   TUBAL LIGATION  1979   Patient Active Problem List   Diagnosis Date Noted   B12 deficiency 06/11/2022   Iron deficiency anemia 04/10/2022   Morbid obesity (HCC) 03/30/2022   Vitamin A deficiency 08/15/2021   Parkinson's disease (HCC) 08/03/2021   Acute urinary retention 08/03/2021   Meningioma (HCC) 04/05/2021   Hypothyroidism 08/15/2020   Osteopenia 06/14/2020   Status post total hip replacement, 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   Osteoarthritis of right hip 03/26/2016   H/O gastric bypass 03/26/2016   Sinus tachycardia 08/01/2015   Obesity (BMI 30-39.9) 06/08/2015   OSA (obstructive sleep apnea) 06/08/2015   GERD without esophagitis 06/08/2015   Incisional hernia 06/08/2015   Arthritis of hip 03/23/2015   Left knee pain 02/15/2015   Vitamin D deficiency 09/29/2014   Lump of right breast 09/22/2014   Prediabetes 09/22/2014  Pruritic dermatitis 09/22/2014   Obstructive sleep apnea 09/22/2014   Allergic rhinitis 09/15/2009   Hypersomnia 09/14/2009   Panic disorder 08/06/2008   Essential (primary) hypertension 07/16/2007   Hyperlipidemia 07/16/2007    ONSET DATE: Approximately 2 years ago  REFERRING DIAG: R26.89 (ICD-10-CM) - Balance problem   THERAPY DIAG:  Difficulty in walking, not elsewhere classified  Muscle weakness (generalized)  Repeated falls  Other lack of coordination  Dizziness and giddiness  Rationale for Evaluation and Treatment: Rehabilitation  SUBJECTIVE:                                                                                                                                                                                              SUBJECTIVE STATEMENT:   Pt reports she has a significant history of a high number of falls over the past 2 years.  Reports she had a fall on Saturday, but for the first time in the past 2 years, she was able to actually get-up off of the floor using the stairs and stair railing! Pt states normally her husband has to call for EMS to come assist her off of the floor. Pt was excited to report she could get off the floor. Stating she can tell she is getting stronger!   Pt accompanied by: self and family member (husband)  PERTINENT HISTORY: Pt is a 67y.o. female with concern for Parkinsonism due to resting tremors (R>L) with occasional whole-body jerks/myoclonus, bradykinesia, hallucinations while waking up, bilateral bradykinesia (L>R), minimal cogwheeling bilaterally, sensation of stiffness, speech changes, and imbalance (trialed Carbidopa-Levodopa, but intolerable due to side effects of severe nausea & vomiting). Pt with hx of frequent falls and bilateral LE weakness. Pt reports hx of kidney removal on R side with large abdominal incision and R hip replacement.  PAIN:  Are you having pain? No  PRECAUTIONS: Fall  RED FLAGS: None   WEIGHT BEARING RESTRICTIONS: No  FALLS: Has patient fallen in last 6 months? Yes. Number of falls 5-6 (2 in driveway, 1 on sidewalk)  Most recent fall last Thursday 05/23/2023, pt reports she became "disoriented" and had LOB, but no injuries- pt states in general she is "very easily distracted and then she will misstep causing her to have LOB"  LIVING ENVIRONMENT: Lives with: lives with their spouse Brittany Benton) Lives in: House/apartment Stairs: Yes: Internal: reports she doesn't have to go to different floors in house (4 steps inside); and External: 3-4 steps to enter with R HRs + courtyard + additional 4-5 steps onto porch with B HRs -  entrance has  steep driveway Has following equipment at home: Dan Humphreys - 4  wheeled, shower bench, grabbars in bathroom, bedrail, and Ramped entry from garage (but has to go up steep driveway)  PLOF: Independent with basic ADLs, Independent with household mobility with device, Independent with household mobility without device, and Requires assistive device for independence Reports started using rollator more consistently ~2-3weeks ago but has used it on/off for a while  PATIENT GOALS: Wants to feel comfortable moving around and be shown how to do that again  OBJECTIVE:  Note: Objective measures were completed at Evaluation unless otherwise noted.  DIAGNOSTIC FINDINGS:   CT Head without Contrast 04/03/2021: Chronic atrophic changes without acute abnormality.   MRI Brain without Contrast 04/04/2021: 1. No acute intracranial abnormality. 2. Mild age-related cerebral atrophy with chronic small vessel ischemic disease. 3. 1.1 cm meningioma overlying the left frontal convexity without associated mass effect or edema.   CT Cervical Spine without Contrast 04/03/2021: Moderate severity multilevel degenerative changes, most prominent at the levels of C4-C5, C5-C6 and C6-C7. No evidence of an acute fracture or subluxation.   COGNITION: Overall cognitive status: Within functional limits for tasks assessed   SENSATION: WFL  COORDINATION: WFL for basic functional mobility tasks  EDEMA:  None observed  MUSCLE TONE: WNL  MUSCLE LENGTH: Not tested  DTRs:  Not tested  POSTURE: rounded shoulders, forward head, and increased lumbar lordosis  LOWER EXTREMITY ROM:     Passive  All WFL for basic functional mobility tasks Right Eval Left Eval  Hip flexion    Hip extension    Hip abduction    Hip adduction    Hip internal rotation    Hip external rotation    Knee flexion    Knee extension    Ankle dorsiflexion    Ankle plantarflexion    Ankle inversion    Ankle eversion     (Blank rows = not  tested)  LOWER EXTREMITY MMT:    MMT Right Eval Left Eval  Hip flexion 3+ 4  Hip extension    Hip abduction    Hip adduction    Hip internal rotation    Hip external rotation    Knee flexion 4- 4-  Knee extension 4 4  Ankle dorsiflexion 4 4-  Ankle plantarflexion 4- 4-  Ankle inversion    Ankle eversion    (Blank rows = not tested)  Manual Muscle Test Scale 0/5 = No muscle contraction can be seen or felt 1/5 = Contraction can be felt, but there is no motion 2-/5 = Part moves through incomplete ROM w/ gravity decreased 2/5 = Part moves through complete ROM w/ gravity decreased 2+/5 = Part moves through incomplete ROM (<50%) against gravity or through complete ROM w/ gravity 3-/5 = Part moves through incomplete ROM (>50%) against gravity 3/5 = Part moves through complete ROM against gravity 3+/5 = Part moves through complete ROM against gravity/slight resistance 4-/5= Holds test position against slight to moderate pressure 4/5 = Part moves through complete ROM against gravity/moderate resistance 4+/5= Holds test position against moderate to strong pressure 5/5 = Part moves through complete ROM against gravity/full resistance  BED MOBILITY:  Sit to supine Min A and requires used of bedrail Supine to sit Min A and requires used of bedrail Rolling to Right Min A Rolling to Left Min A Relies on bedrial, difficulty with lateral scooting in the bed (or in sitting)  TRANSFERS: Assistive device utilized: Environmental consultant - 4 wheeled  Sit to stand: SBA and CGA Stand to sit: SBA and CGA Chair to chair: SBA  and CGA Without AD requires more consistent CGA/light min A Floor:  not assessed  RAMP:  Assistive device utilized: Walker - 4 wheeled Ramp Comments: would benefit from assessment of this when able due to steep driveway at home  CURB:   Curb Comments: Not assessed  STAIRS: Level of Assistance: CGA Stair Negotiation Technique: Step to Pattern with Bilateral Rails ascending with  R LE and descending with L LE Number of Stairs: 4  Height of Stairs: 6in  Comments: Pt reports feeling more confident when having B HR support  GAIT: Gait pattern: step through pattern, decreased step length- Right, decreased step length- Left, decreased stride length, shuffling, trendelenburg, lateral hip instability, lateral lean- Right, lateral lean- Left, wide BOS, poor foot clearance- Right, and poor foot clearance- Left --- R/L lateral trunk lean over stance limb, decreased step lengths bilaterally, partial shuffled gait, wide BOS, increased balance instability Distance walked: ~66ft using HHA progressed to no UE support Assistive device utilized:  HHA progressed to no UE support Level of assistance: CGA and Min A Comments: Slow gait speed  FUNCTIONAL TESTS:  5 times sit to stand: on 06/04/2023: 59.25 seconds Timed up and go (TUG): 24.90sec without AD, 22.25 with rollator 3 minute walk test: on 06/04/2023: 249ft using rollator = 80.26meters 10 meter walk test: 36.75 seconds = 0.64m/s without AD; 06/04/23 0.46m/s average gait speed during 3 minute walk test using rollator  Berg Balance Scale: on 06/04/2023 = 31/56 Would benefit from Modified CTSIB & FGA  PATIENT SURVEYS:  ABC scale 17.5%  Details: patient 0% confident with majority of items; 50% confident household level ambulation, 40% stairs, 50% sweeping floor, 50% getting in/out of car, and 90% confident ambulating up/down ramp with rails    Vestibular Assessment  - performed on 06/07/2023 - re-assessed on 2/25 with details in that treatment note  OCULOMOTOR EXAM:  Ocular Alignment: normal  Ocular ROM: No Limitations  Spontaneous Nystagmus: absent  Gaze-Induced Nystagmus: absent and continues to have difficulty keeping eyes on target, especially towards L  Smooth Pursuits:  difficulty keeping eyes on target  Saccades: hypometric/undershoots, extra eye movements, and difficulty finding target on L side  Convergence/Divergence: not  assessed Cover/Cross Cover: WNL  VESTIBULAR - OCULAR REFLEX:   VOR Cancellation: Unable to Maintain Gaze, inconsistent ability to keep eyes on target  Head-Impulse Test: HIT Right: negative HIT Left: negative -- had a difficult time keeping eyes on target in both directions  Dynamic Visual Acuity:  would benefit from future testing    POSITIONAL TESTING: Right Dix-Hallpike: upbeating, right nystagmus Left Dix-Hallpike: benefit from testing at next visit Right Roll Test: no nystagmus Left Roll Test: no nystagmus Left Sidelying: no nystagmus  MOTION SENSITIVITY:  May benefit from testing in future visits                                                                                                   TREATMENT DATE:     07/23/23   Unless otherwise stated, CGA/SBA was provided and gait belt donned in order to ensure pt safety throughout session.  B UE  and B LE reciprocal movement pattern retraining on Nustep against level 4 resistance for 3 minutes, increased to level 5 for 3 minutes - goal of maintaining SPM >45 with pt successful >90% of the time - achieves 225 steps.   B LE functional strengthening to improve transfers, stair navigation, and floor transfers including:  - supine bridge x15 reps  - progressed to adding level 3 green theraband resistance around knees and 5lb weight across hips 2x 15reps *ensured bridges added to pt's HEP* - sit<>stands from EOM (which is slightly elevated for pt's height) x5 reps  - progressed to holding 3lb dumbbell in each hand x8 reps - then to performing standing bicep curls on each rep for 2nd set of 8 reps   Step-ups on green step with L HHA when stepping up leading with R LE and R HHA when stepping up leading with L LE x5 reps - requires min A for safety -  pt with more difficulty stepping up leading with L LE due to glute and quad weakness causing her knee to slightly start to give way - pt with much improved confidence with this exercise  compared to last time performed with this therapist, allowing her to complete all 5 reps on each LE!  Gait training ~156ft + ~12ft, no UE support progressed to L HHA, with CGA progressed to min A for balance as pt becomes fatigued - continues to have slightly wider BOS with slight increased R/L weight shift over stance limbs, arms held in guarded posture with lack of true arm swing.   PATIENT EDUCATION:  Education details: PT POC, balance impairments and increased fall risk based on outcome measures, HEP Person educated: Patient and Spouse Education method: Explanation Education comprehension: verbalized understanding and needs further education  HOME EXERCISE PROGRAM:  Access Code: [email or web address] URL: [email or web address] Date: 07/23/2023 Prepared by: Casimiro Needle  Exercises - Walking  - 1 x daily - 7 x weekly - 3-5 sets - 3 minutes hold - Sit to Stand with Hands on Knees  - 1 x daily - 7 x weekly - 2 sets - 10 reps - Standing March with Counter Support  - 1 x daily - 7 x weekly - 2 sets - 10 reps - Supine Bridge  - 1 x daily - 7 x weekly - 2 sets - 10 reps    GOALS: Goals reviewed with patient? Yes  SHORT TERM GOALS: Target date: 07/09/2023   Patient will be independent in home exercise program to improve strength/mobility for better functional independence with ADLs. Baseline: provided on 06/04/2023 07/09/23: pt reports performing, will need to progress Goal status: IN PROGRESS  2. Patient will improve ABC scale by 20% indicating increased confidence performing daily mobility tasks, which is associated with fall risk.    Baseline: 05/28/2023 = 17.5%;   07/09/23: need to re-assess; 3/28: 45.6%   Goal Status: MET   LONG TERM GOALS: Target date: 08/20/2023   Patient (> 43 years old) will complete five times sit to stand test in < 15 seconds indicating an increased LE strength and improved balance. Baseline: 06/04/2023: 59.25 seconds 07/09/23: 20.27 seconds using UE  support on armrests 3/28: unable to complete 5 sit to stands without UE support - stopped at 3 @  34.9 seconds  Goal status: IN PROGRESS  Patient will increase Berg Balance score by > 6 points to demonstrate decreased fall risk during functional activities. Baseline: 06/04/2023: 31/56 07/09/23: 38/56 Goal Updated: Will increase Berg Balance score to >45/56  Goal status: MET and Revised on 07/09/2023   Patient will reduce timed up and go to <11 seconds to reduce fall risk and demonstrate improved transfer/gait ability. Baseline: 05/28/2023 = 22.25sec using rollator vs 24.90 seconds without AD 07/09/23: 18.35 seconds using rollator and 19.07 sec without AD Goal status: IN PROGRESS  Patient will increase 10 meter walk test to >0.80m/s as to improve gait speed for better community ambulation and to reduce fall risk. Baseline: 05/28/2023 = 0.76m/s 07/09/23: 0.59 m/s (16.90 seconds) using rollator & 0.43 m/s (22.89 seconds) without AD Goal status: IN PROGRESS  Patient will increase 3 minute walk test distance to >375ft for progression to limited community ambulation and improved gait ability Baseline: 06/04/2023 = 220ft (80.23meters) using rollator 07/09/23: need to re-assess 3/28: 290' using rollator  Goal status: IN PROGRESS   ASSESSMENT:  CLINICAL IMPRESSION:   Pt eager to participate in therapy session and reports feeling she is getting stronger. Therapy session focused on functional B LE strengthening with pt tolerating progression of increased resistance on Nustep, increased repetition of weighted squats and bridges, as well as ability to step-up onto green step 5x per LE with HHA providing min A! Pt demonstrating increasing strength and increasing confidence with these interventions. Mrs. Riquelme will benefit from continued skilled PT to improve these deficits in order to increase QOL, ease/safety with ADLs, decrease fall risk, and increase independence with functional mobility at household and  community level.    OBJECTIVE IMPAIRMENTS: Abnormal gait, cardiopulmonary status limiting activity, decreased activity tolerance, decreased balance, decreased endurance, decreased mobility, difficulty walking, and decreased strength.   ACTIVITY LIMITATIONS: carrying, lifting, bending, standing, squatting, stairs, transfers, bed mobility, reach over head, and locomotion level  PARTICIPATION LIMITATIONS: meal prep, cleaning, laundry, shopping, and community activity  PERSONAL FACTORS: Age, Behavior pattern, Fitness, Time since onset of injury/illness/exacerbation, and 3+ comorbidities: Parkinsonism, anxiety, arthritis, depression, HTN  are also affecting patient's functional outcome.   REHAB POTENTIAL: Good  CLINICAL DECISION MAKING: Evolving/moderate complexity  EVALUATION COMPLEXITY: Moderate  PLAN:  PT FREQUENCY: 1-2x/week  PT DURATION: 12 weeks  PLANNED INTERVENTIONS: 97164- PT Re-evaluation, 97110-Therapeutic exercises, 97530- Therapeutic activity, 97112- Neuromuscular re-education, 97535- Self Care, 62130- Manual therapy, [phone]- Gait training, [phone]- Canalith repositioning, Patient/Family education, Balance training, Stair training, Vestibular training, Visual/preceptual remediation/compensation, Cognitive remediation, and DME instructions   PLAN FOR NEXT SESSION:  - Nustep - follow-up on vestibular treatment from 06/28/2023 if needed - B LE functional strengthening   - adding weights/resistance training as appropriate   - continue bridges - dynamic balance and gait challenges with gaze stabilization dual-task challenges  - turning and stepping in different directions over small obstacles - dynamic reaching with turning and stepping - lateral side stepping, backwards stepping in // bars *pt very fearful of falling with balance interventions*   Vada Yellen Verdell Face, PT, DPT Physical Therapist - [facility]  [facility]  2:04 PM 07/23/23

## 2023-07-24 ENCOUNTER — Encounter: Payer: Self-pay | Admitting: Family Medicine

## 2023-07-24 LAB — CBC
Hematocrit: 46.1 % (ref 34.0–46.6)
Hemoglobin: 14.7 g/dL (ref 11.1–15.9)
MCH: 27.9 pg (ref 26.6–33.0)
MCHC: 31.9 g/dL (ref 31.5–35.7)
MCV: 88 fL (ref 79–97)
Platelets: 289 10*3/uL (ref 150–450)
RBC: 5.26 x10E6/uL (ref 3.77–5.28)
RDW: 14.3 % (ref 11.7–15.4)
WBC: 7.3 10*3/uL (ref 3.4–10.8)

## 2023-07-24 LAB — COMPREHENSIVE METABOLIC PANEL WITH GFR
ALT: 13 IU/L (ref 0–32)
AST: 20 IU/L (ref 0–40)
Albumin: 4.3 g/dL (ref 3.9–4.9)
Alkaline Phosphatase: 107 IU/L (ref 44–121)
BUN/Creatinine Ratio: 9 — ABNORMAL LOW (ref 12–28)
BUN: 8 mg/dL (ref 8–27)
Bilirubin Total: 0.5 mg/dL (ref 0.0–1.2)
CO2: 23 mmol/L (ref 20–29)
Calcium: 9.9 mg/dL (ref 8.7–10.3)
Chloride: 99 mmol/L (ref 96–106)
Creatinine, Ser: 0.89 mg/dL (ref 0.57–1.00)
Globulin, Total: 2.4 g/dL (ref 1.5–4.5)
Glucose: 86 mg/dL (ref 70–99)
Potassium: 4.7 mmol/L (ref 3.5–5.2)
Sodium: 139 mmol/L (ref 134–144)
Total Protein: 6.7 g/dL (ref 6.0–8.5)
eGFR: 70 mL/min/{1.73_m2} (ref >59)

## 2023-07-24 LAB — TSH: TSH: 2.21 u[IU]/mL (ref 0.450–4.500)

## 2023-07-24 LAB — IRON,TIBC AND FERRITIN PANEL
Ferritin: 172 ng/mL — ABNORMAL HIGH (ref 15–150)
Iron Saturation: 19 % (ref 15–55)
Iron: 58 ug/dL (ref 27–139)
Total Iron Binding Capacity: 298 ug/dL (ref 250–450)
UIBC: 240 ug/dL (ref 118–369)

## 2023-07-24 LAB — T4, FREE: Free T4: 1.96 ng/dL — ABNORMAL HIGH (ref 0.82–1.77)

## 2023-07-24 LAB — LIPID PANEL
Chol/HDL Ratio: 2.6 ratio (ref 0.0–4.4)
Cholesterol, Total: 148 mg/dL (ref 100–199)
HDL: 56 mg/dL (ref >39)
LDL Chol Calc (NIH): 66 mg/dL (ref 0–99)
Triglycerides: 152 mg/dL — ABNORMAL HIGH (ref 0–149)
VLDL Cholesterol Cal: 26 mg/dL (ref 5–40)

## 2023-07-24 LAB — HEMOGLOBIN A1C
Est. average glucose Bld gHb Est-mCnc: 103 mg/dL
Hgb A1c MFr Bld: 5.2 % (ref 4.8–5.6)

## 2023-07-24 LAB — VITAMIN B12: Vitamin B-12: 462 pg/mL (ref 232–1245)

## 2023-07-26 ENCOUNTER — Ambulatory Visit: Payer: Medicare Other

## 2023-07-26 ENCOUNTER — Encounter: Payer: Self-pay | Admitting: Physical Therapy

## 2023-07-26 ENCOUNTER — Encounter: Payer: Medicare Other | Admitting: Speech Pathology

## 2023-07-26 DIAGNOSIS — M6281 Muscle weakness (generalized): Secondary | ICD-10-CM | POA: Diagnosis not present

## 2023-07-26 DIAGNOSIS — R296 Repeated falls: Secondary | ICD-10-CM

## 2023-07-26 DIAGNOSIS — R42 Dizziness and giddiness: Secondary | ICD-10-CM | POA: Diagnosis not present

## 2023-07-26 DIAGNOSIS — R262 Difficulty in walking, not elsewhere classified: Secondary | ICD-10-CM

## 2023-07-26 DIAGNOSIS — R278 Other lack of coordination: Secondary | ICD-10-CM

## 2023-07-26 NOTE — Therapy (Signed)
 OUTPATIENT PHYSICAL THERAPY NEURO TREATMENT   Patient Name: Brittany Benton MRN: [id number] DOB:1955/09/06, 68 y.o., female Today's Date: 07/26/2023   PCP: Brittany Limes, MD REFERRING PROVIDER: Lonell Face, MD   END OF SESSION:    PT End of Session - 07/26/23 1015     Visit Number 14    Number of Visits 24    Date for PT Re-Evaluation 08/20/23    Authorization Type [employer] 2025 - auth [id number]/[id number] for 16 visits    PT Start Time 1015    PT Stop Time 1056    PT Time Calculation (min) 41 min    Equipment Utilized During Treatment Gait belt    Activity Tolerance Patient tolerated treatment well;No increased pain    Behavior During Therapy [facility] for tasks assessed/performed              Past Medical History:  Diagnosis Date   Allergy 1989   Anemia    Anxiety    Arthritis    osteoarthritis -right hip   Cataract    Depression    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   GERD (gastroesophageal reflux disease)    Hernia, incisional    after renal surgery   History of chicken pox    Hyperlipidemia    Hypertension    Renal cell carcinoma (HCC)    s/p right nephrectomy 1994   Sleep apnea    Thyroid disease    Past Surgical History:  Procedure Laterality Date   APPENDECTOMY  1994   BIOPSY  11/07/2022   Procedure: BIOPSY;  Surgeon: Brittany Mood, MD;  Location: [facility] ENDOSCOPY;  Service: Gastroenterology;;   BREAST BIOPSY Right 1991   Negative   CHOLECYSTECTOMY  1994   COLONOSCOPY WITH PROPOFOL N/A 11/07/2022   Procedure: COLONOSCOPY WITH PROPOFOL;  Surgeon: Brittany Mood, MD;  Location: [facility] ENDOSCOPY;  Service: Gastroenterology;  Laterality: N/A;   ESOPHAGOGASTRODUODENOSCOPY (EGD) WITH PROPOFOL N/A 11/07/2022   Procedure: ESOPHAGOGASTRODUODENOSCOPY (EGD) WITH PROPOFOL;  Surgeon: Brittany Mood, MD;  Location: [facility] ENDOSCOPY;  Service: Gastroenterology;  Laterality: N/A;   JOINT REPLACEMENT  2019   LAPAROSCOPIC GASTRIC SLEEVE RESECTION  02/02/2016   Dr Brittany Benton  at [facility]   NEPHRECTOMY  1994   Renal Cell Carcinoma   parotid gland removal  1990   also removed a tumor   POLYPECTOMY  11/07/2022   Procedure: POLYPECTOMY INTESTINAL;  Surgeon: Brittany Mood, MD;  Location: [facility] ENDOSCOPY;  Service: Gastroenterology;;   TONSILLECTOMY     TOTAL HIP ARTHROPLASTY Right 08/08/2016   Procedure: TOTAL HIP ARTHROPLASTY;  Surgeon: Brittany Saint, MD;  Location: [facility] ORS;  Service: Orthopedics;  Laterality: Right;   TUBAL LIGATION  1979   Patient Active Problem List   Diagnosis Date Noted   B12 deficiency 06/11/2022   Iron deficiency anemia 04/10/2022   Morbid obesity (HCC) 03/30/2022   Vitamin A deficiency 08/15/2021   Parkinson's disease (HCC) 08/03/2021   Acute urinary retention 08/03/2021   Meningioma (HCC) 04/05/2021   Hypothyroidism 08/15/2020   Osteopenia 06/14/2020   Status post total hip replacement, 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   Osteoarthritis of right hip 03/26/2016   H/O gastric bypass 03/26/2016   Sinus tachycardia 08/01/2015   Obesity (BMI 30-39.9) 06/08/2015   OSA (obstructive sleep apnea) 06/08/2015   GERD without esophagitis 06/08/2015   Incisional hernia 06/08/2015   Arthritis of hip 03/23/2015   Left knee pain 02/15/2015   Vitamin D deficiency 09/29/2014   Lump of right breast 09/22/2014   Prediabetes 09/22/2014  Pruritic dermatitis 09/22/2014   Obstructive sleep apnea 09/22/2014   Allergic rhinitis 09/15/2009   Hypersomnia 09/14/2009   Panic disorder 08/06/2008   Essential (primary) hypertension 07/16/2007   Hyperlipidemia 07/16/2007    ONSET DATE: Approximately 2 years ago  REFERRING DIAG: R26.89 (ICD-10-CM) - Balance problem   THERAPY DIAG:  Difficulty in walking, not elsewhere classified  Muscle weakness (generalized)  Repeated falls  Other lack of coordination  Rationale for Evaluation and Treatment: Rehabilitation  SUBJECTIVE:                                                                                                                                                                                              SUBJECTIVE STATEMENT:    Patient reports doing well this date. Denies falls since last session.   Pt accompanied by: self and family member (husband)  PERTINENT HISTORY: Pt is a 68y.o. female with concern for Parkinsonism due to resting tremors (R>L) with occasional whole-body jerks/myoclonus, bradykinesia, hallucinations while waking up, bilateral bradykinesia (L>R), minimal cogwheeling bilaterally, sensation of stiffness, speech changes, and imbalance (trialed Carbidopa-Levodopa, but intolerable due to side effects of severe nausea & vomiting). Pt with hx of frequent falls and bilateral LE weakness. Pt reports hx of kidney removal on R side with large abdominal incision and R hip replacement.  PAIN:  Are you having pain? No  PRECAUTIONS: Fall  RED FLAGS: None   WEIGHT BEARING RESTRICTIONS: No  FALLS: Has patient fallen in last 6 months? Yes. Number of falls 5-6 (2 in driveway, 1 on sidewalk)  Most recent fall last Thursday 05/23/2023, pt reports she became "disoriented" and had LOB, but no injuries- pt states in general she is "very easily distracted and then she will misstep causing her to have LOB"  LIVING ENVIRONMENT: Lives with: lives with their spouse Brittany Benton) Lives in: House/apartment Stairs: Yes: Internal: reports she doesn't have to go to different floors in house (4 steps inside); and External: 3-4 steps to enter with R HRs + courtyard + additional 4-5 steps onto porch with B HRs -  entrance has steep driveway Has following equipment at home: Dan Humphreys - 4 wheeled, shower bench, grabbars in bathroom, bedrail, and Ramped entry from garage (but has to go up steep driveway)  PLOF: Independent with basic ADLs, Independent with household mobility with device, Independent with household mobility without device, and Requires assistive device for independence Reports started using rollator more  consistently ~2-3weeks ago but has used it on/off for a while  PATIENT GOALS: Wants to feel comfortable moving around and be shown how to do that again  OBJECTIVE:  Note: Objective  measures were completed at Evaluation unless otherwise noted.  DIAGNOSTIC FINDINGS:   CT Head without Contrast 04/03/2021: Chronic atrophic changes without acute abnormality.   MRI Brain without Contrast 04/04/2021: 1. No acute intracranial abnormality. 2. Mild age-related cerebral atrophy with chronic small vessel ischemic disease. 3. 1.1 cm meningioma overlying the left frontal convexity without associated mass effect or edema.   CT Cervical Spine without Contrast 04/03/2021: Moderate severity multilevel degenerative changes, most prominent at the levels of C4-C5, C5-C6 and C6-C7. No evidence of an acute fracture or subluxation.   COGNITION: Overall cognitive status: Within functional limits for tasks assessed   SENSATION: WFL  COORDINATION: WFL for basic functional mobility tasks  EDEMA:  None observed  MUSCLE TONE: WNL  MUSCLE LENGTH: Not tested  DTRs:  Not tested  POSTURE: rounded shoulders, forward head, and increased lumbar lordosis  LOWER EXTREMITY ROM:     Passive  All WFL for basic functional mobility tasks Right Eval Left Eval  Hip flexion    Hip extension    Hip abduction    Hip adduction    Hip internal rotation    Hip external rotation    Knee flexion    Knee extension    Ankle dorsiflexion    Ankle plantarflexion    Ankle inversion    Ankle eversion     (Blank rows = not tested)  LOWER EXTREMITY MMT:    MMT Right Eval Left Eval  Hip flexion 3+ 4  Hip extension    Hip abduction    Hip adduction    Hip internal rotation    Hip external rotation    Knee flexion 4- 4-  Knee extension 4 4  Ankle dorsiflexion 4 4-  Ankle plantarflexion 4- 4-  Ankle inversion    Ankle eversion    (Blank rows = not tested)  Manual Muscle Test Scale 0/5 = No muscle  contraction can be seen or felt 1/5 = Contraction can be felt, but there is no motion 2-/5 = Part moves through incomplete ROM w/ gravity decreased 2/5 = Part moves through complete ROM w/ gravity decreased 2+/5 = Part moves through incomplete ROM (<50%) against gravity or through complete ROM w/ gravity 3-/5 = Part moves through incomplete ROM (>50%) against gravity 3/5 = Part moves through complete ROM against gravity 3+/5 = Part moves through complete ROM against gravity/slight resistance 4-/5= Holds test position against slight to moderate pressure 4/5 = Part moves through complete ROM against gravity/moderate resistance 4+/5= Holds test position against moderate to strong pressure 5/5 = Part moves through complete ROM against gravity/full resistance  BED MOBILITY:  Sit to supine Min A and requires used of bedrail Supine to sit Min A and requires used of bedrail Rolling to Right Min A Rolling to Left Min A Relies on bedrial, difficulty with lateral scooting in the bed (or in sitting)  TRANSFERS: Assistive device utilized: Environmental consultant - 4 wheeled  Sit to stand: SBA and CGA Stand to sit: SBA and CGA Chair to chair: SBA and CGA Without AD requires more consistent CGA/light min A Floor:  not assessed  RAMP:  Assistive device utilized: Environmental consultant - 4 wheeled Ramp Comments: would benefit from assessment of this when able due to steep driveway at home  CURB:   Curb Comments: Not assessed  STAIRS: Level of Assistance: CGA Stair Negotiation Technique: Step to Pattern with Bilateral Rails ascending with R LE and descending with L LE Number of Stairs: 4  Height of Stairs: 6in  Comments: Pt reports feeling more confident when having B HR support  GAIT: Gait pattern: step through pattern, decreased step length- Right, decreased step length- Left, decreased stride length, shuffling, trendelenburg, lateral hip instability, lateral lean- Right, lateral lean- Left, wide BOS, poor foot  clearance- Right, and poor foot clearance- Left --- R/L lateral trunk lean over stance limb, decreased step lengths bilaterally, partial shuffled gait, wide BOS, increased balance instability Distance walked: ~84ft using HHA progressed to no UE support Assistive device utilized:  HHA progressed to no UE support Level of assistance: CGA and Min A Comments: Slow gait speed  FUNCTIONAL TESTS:  5 times sit to stand: on 06/04/2023: 59.25 seconds Timed up and go (TUG): 24.90sec without AD, 22.25 with rollator 3 minute walk test: on 06/04/2023: 21ft using rollator = 80.66meters 10 meter walk test: 36.75 seconds = 0.64m/s without AD; 06/04/23 0.71m/s average gait speed during 3 minute walk test using rollator  Berg Balance Scale: on 06/04/2023 = 31/56 Would benefit from Modified CTSIB & FGA  PATIENT SURVEYS:  ABC scale 17.5%  Details: patient 0% confident with majority of items; 50% confident household level ambulation, 40% stairs, 50% sweeping floor, 50% getting in/out of car, and 90% confident ambulating up/down ramp with rails    Vestibular Assessment  - performed on 06/07/2023 - re-assessed on 2/25 with details in that treatment note  OCULOMOTOR EXAM:  Ocular Alignment: normal  Ocular ROM: No Limitations  Spontaneous Nystagmus: absent  Gaze-Induced Nystagmus: absent and continues to have difficulty keeping eyes on target, especially towards L  Smooth Pursuits:  difficulty keeping eyes on target  Saccades: hypometric/undershoots, extra eye movements, and difficulty finding target on L side  Convergence/Divergence: not assessed Cover/Cross Cover: WNL  VESTIBULAR - OCULAR REFLEX:   VOR Cancellation: Unable to Maintain Gaze, inconsistent ability to keep eyes on target  Head-Impulse Test: HIT Right: negative HIT Left: negative -- had a difficult time keeping eyes on target in both directions  Dynamic Visual Acuity:  would benefit from future testing    POSITIONAL TESTING: Right Dix-Hallpike:  upbeating, right nystagmus Left Dix-Hallpike: benefit from testing at next visit Right Roll Test: no nystagmus Left Roll Test: no nystagmus Left Sidelying: no nystagmus  MOTION SENSITIVITY:  May benefit from testing in future visits                                                                                                   TREATMENT DATE:     07/26/23     Unless otherwise stated, CGA/SBA was provided and gait belt donned in order to ensure pt safety throughout session.  Nustep level 4-5 x 6 minutes to improve reciprocal movement pattern of BLE and BUE. Goal of spm >45  B LE functional strengthening to improve transfers, stair navigation, and floor transfers including:  - sit<>stands from EOM (which is slightly elevated for pt's height) x5 reps  - progressed to holding 3lb dumbbell in each hand x8 reps -Seated LAQ with 2# AW x 10 with 3 second hold   Gait training x 100', x 50' with no UE support  progressed to L HHA with fatigue - CGA initially, minA with fatigue   Fwd step ups onto green step leading with R LE x 7 reps with HHA, leading with LLE - unable to lead with LLE this date. Required modA for posterior LOB when attempting stepping up with LLE (fearful after attempt)  Seated hip abduction with GTB around knees 2 x 10  Seated marching with GTB around knees 2 x 10  Standing heel/toe raises with rollator support x 10   PATIENT EDUCATION:  Education details: PT POC, balance impairments and increased fall risk based on outcome measures, HEP Person educated: Patient and Spouse Education method: Explanation Education comprehension: verbalized understanding and needs further education  HOME EXERCISE PROGRAM:  Access Code: [email or web address] URL: [email or web address] Date: 07/23/2023 Prepared by: Casimiro Needle  Exercises - Walking  - 1 x daily - 7 x weekly - 3-5 sets - 3 minutes hold - Sit to Stand with Hands on Knees  - 1 x daily - 7 x weekly - 2 sets - 10  reps - Standing March with Counter Support  - 1 x daily - 7 x weekly - 2 sets - 10 reps - Supine Bridge  - 1 x daily - 7 x weekly - 2 sets - 10 reps    GOALS: Goals reviewed with patient? Yes  SHORT TERM GOALS: Target date: 07/09/2023   Patient will be independent in home exercise program to improve strength/mobility for better functional independence with ADLs. Baseline: provided on 06/04/2023 07/09/23: pt reports performing, will need to progress Goal status: IN PROGRESS  2. Patient will improve ABC scale by 20% indicating increased confidence performing daily mobility tasks, which is associated with fall risk.    Baseline: 05/28/2023 = 17.5%;   07/09/23: need to re-assess; 3/28: 45.6%   Goal Status: MET   LONG TERM GOALS: Target date: 08/20/2023   Patient (> 36 years old) will complete five times sit to stand test in < 15 seconds indicating an increased LE strength and improved balance. Baseline: 06/04/2023: 59.25 seconds 07/09/23: 20.27 seconds using UE support on armrests 3/28: unable to complete 5 sit to stands without UE support - stopped at 3 @  34.9 seconds  Goal status: IN PROGRESS  Patient will increase Berg Balance score by > 6 points to demonstrate decreased fall risk during functional activities. Baseline: 06/04/2023: 31/56 07/09/23: 38/56 Goal Updated: Will increase Berg Balance score to >45/56 Goal status: MET and Revised on 07/09/2023   Patient will reduce timed up and go to <11 seconds to reduce fall risk and demonstrate improved transfer/gait ability. Baseline: 05/28/2023 = 22.25sec using rollator vs 24.90 seconds without AD 07/09/23: 18.35 seconds using rollator and 19.07 sec without AD Goal status: IN PROGRESS  Patient will increase 10 meter walk test to >0.60m/s as to improve gait speed for better community ambulation and to reduce fall risk. Baseline: 05/28/2023 = 0.41m/s 07/09/23: 0.59 m/s (16.90 seconds) using rollator & 0.43 m/s (22.89 seconds) without AD Goal  status: IN PROGRESS  Patient will increase 3 minute walk test distance to >341ft for progression to limited community ambulation and improved gait ability Baseline: 06/04/2023 = 238ft (80.50meters) using rollator 07/09/23: need to re-assess 3/28: 290' using rollator  Goal status: IN PROGRESS   ASSESSMENT:  CLINICAL IMPRESSION:    Patient eager to participate in session this date although feeling weaker this date. Session focused on BLE strengthening and gait tolerance with/without UE support. X1 posterior LOB with step up leading with LLE which  made patient fearful to continue with standing balance activities. Mrs. Lauritsen will benefit from continued skilled PT to improve these deficits in order to increase QOL, ease/safety with ADLs, decrease fall risk, and increase independence with functional mobility at household and community level.    OBJECTIVE IMPAIRMENTS: Abnormal gait, cardiopulmonary status limiting activity, decreased activity tolerance, decreased balance, decreased endurance, decreased mobility, difficulty walking, and decreased strength.   ACTIVITY LIMITATIONS: carrying, lifting, bending, standing, squatting, stairs, transfers, bed mobility, reach over head, and locomotion level  PARTICIPATION LIMITATIONS: meal prep, cleaning, laundry, shopping, and community activity  PERSONAL FACTORS: Age, Behavior pattern, Fitness, Time since onset of injury/illness/exacerbation, and 3+ comorbidities: Parkinsonism, anxiety, arthritis, depression, HTN  are also affecting patient's functional outcome.   REHAB POTENTIAL: Good  CLINICAL DECISION MAKING: Evolving/moderate complexity  EVALUATION COMPLEXITY: Moderate  PLAN:  PT FREQUENCY: 1-2x/week  PT DURATION: 12 weeks  PLANNED INTERVENTIONS: 97164- PT Re-evaluation, 97110-Therapeutic exercises, 97530- Therapeutic activity, 97112- Neuromuscular re-education, 97535- Self Care, [id number]- Manual therapy, [phone]- Gait training, [phone]- Canalith  repositioning, Patient/Family education, Balance training, Stair training, Vestibular training, Visual/preceptual remediation/compensation, Cognitive remediation, and DME instructions   PLAN FOR NEXT SESSION:  - Nustep - follow-up on vestibular treatment from 06/28/2023 if needed - B LE functional strengthening   - adding weights/resistance training as appropriate   - continue bridges - dynamic balance and gait challenges with gaze stabilization dual-task challenges  - turning and stepping in different directions over small obstacles - dynamic reaching with turning and stepping - lateral side stepping, backwards stepping in // bars *pt very fearful of falling with balance interventions*   Viviann Spare, PT, DPT Physical Therapist - [facility]  [facility]  10:16 AM 07/26/23

## 2023-07-30 ENCOUNTER — Encounter: Payer: Medicare Other | Admitting: Speech Pathology

## 2023-07-30 ENCOUNTER — Ambulatory Visit: Payer: Medicare Other | Admitting: Physical Therapy

## 2023-07-30 DIAGNOSIS — R262 Difficulty in walking, not elsewhere classified: Secondary | ICD-10-CM

## 2023-07-30 DIAGNOSIS — R278 Other lack of coordination: Secondary | ICD-10-CM | POA: Diagnosis not present

## 2023-07-30 DIAGNOSIS — R296 Repeated falls: Secondary | ICD-10-CM

## 2023-07-30 DIAGNOSIS — R42 Dizziness and giddiness: Secondary | ICD-10-CM

## 2023-07-30 DIAGNOSIS — M6281 Muscle weakness (generalized): Secondary | ICD-10-CM

## 2023-07-30 NOTE — Therapy (Signed)
 OUTPATIENT PHYSICAL THERAPY NEURO TREATMENT   Patient Name: Brittany Benton MRN: [id number] DOB:05/21/1955, 68 y.o., female Today's Date: 07/30/2023   PCP: Lamon Pillow, MD REFERRING PROVIDER: Rosan Comfort, MD   END OF SESSION:    PT End of Session - 07/30/23 1621     Visit Number 15    Number of Visits 24    Date for PT Re-Evaluation 08/20/23    Authorization Type [employer] 2025 - auth 2/18-4/[id number] for 16 visits    PT Start Time 1621    PT Stop Time 1708    PT Time Calculation (min) 47 min    Equipment Utilized During Treatment Gait belt    Activity Tolerance Patient tolerated treatment well;No increased pain    Behavior During Therapy [facility] for tasks assessed/performed               Past Medical History:  Diagnosis Date   Allergy 1989   Anemia    Anxiety    Arthritis    osteoarthritis -right hip   Cataract    Depression    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   GERD (gastroesophageal reflux disease)    Hernia, incisional    after renal surgery   History of chicken pox    Hyperlipidemia    Hypertension    Renal cell carcinoma (HCC)    s/p right nephrectomy 1994   Sleep apnea    Thyroid disease    Past Surgical History:  Procedure Laterality Date   APPENDECTOMY  1994   BIOPSY  11/07/2022   Procedure: BIOPSY;  Surgeon: Luke Salaam, MD;  Location: [facility] ENDOSCOPY;  Service: Gastroenterology;;   BREAST BIOPSY Right 1991   Negative   CHOLECYSTECTOMY  1994   COLONOSCOPY WITH PROPOFOL N/A 11/07/2022   Procedure: COLONOSCOPY WITH PROPOFOL;  Surgeon: Luke Salaam, MD;  Location: [facility] ENDOSCOPY;  Service: Gastroenterology;  Laterality: N/A;   ESOPHAGOGASTRODUODENOSCOPY (EGD) WITH PROPOFOL N/A 11/07/2022   Procedure: ESOPHAGOGASTRODUODENOSCOPY (EGD) WITH PROPOFOL;  Surgeon: Luke Salaam, MD;  Location: [facility] ENDOSCOPY;  Service: Gastroenterology;  Laterality: N/A;   JOINT REPLACEMENT  2019   LAPAROSCOPIC GASTRIC SLEEVE RESECTION  02/02/2016   Dr  Ovid Blow at [facility]   NEPHRECTOMY  1994   Renal Cell Carcinoma   parotid gland removal  1990   also removed a tumor   POLYPECTOMY  11/07/2022   Procedure: POLYPECTOMY INTESTINAL;  Surgeon: Luke Salaam, MD;  Location[facility] ENDOSCOPY;  Service: Gastroenterology;;   TONSILLECTOMY     TOTAL HIP ARTHROPLASTY Right 08/08/2016   Procedure: TOTAL HIP ARTHROPLASTY;  Surgeon: Marlynn Singer, MD;  Location: [facility] ORS;  Service: Orthopedics;  Laterality: Right;   TUBAL LIGATION  1979   Patient Active Problem List   Diagnosis Date Noted   B12 deficiency 06/11/2022   Iron deficiency anemia 04/10/2022   Morbid obesity (HCC) 03/30/2022   Vitamin A deficiency 08/15/2021   Parkinson's disease (HCC) 08/03/2021   Acute urinary retention 08/03/2021   Meningioma (HCC) 04/05/2021   Hypothyroidism 08/15/2020   Osteopenia 06/14/2020   Status post total hip replacement, 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   Osteoarthritis of right hip 03/26/2016   H/O gastric bypass 03/26/2016   Sinus tachycardia 08/01/2015   Obesity (BMI 30-39.9) 06/08/2015   OSA (obstructive sleep apnea) 06/08/2015   GERD without esophagitis 06/08/2015   Incisional hernia 06/08/2015   Arthritis of hip 03/23/2015   Left knee pain 02/15/2015   Vitamin D deficiency 09/29/2014   Lump of right breast 09/22/2014   Prediabetes  09/22/2014   Pruritic dermatitis 09/22/2014   Obstructive sleep apnea 09/22/2014   Allergic rhinitis 09/15/2009   Hypersomnia 09/14/2009   Panic disorder 08/06/2008   Essential (primary) hypertension 07/16/2007   Hyperlipidemia 07/16/2007    ONSET DATE: Approximately 2 years ago  REFERRING DIAG: R26.89 (ICD-10-CM) - Balance problem   THERAPY DIAG:  Difficulty in walking, not elsewhere classified  Muscle weakness (generalized)  Repeated falls  Other lack of coordination  Dizziness and giddiness  Rationale for Evaluation and Treatment: Rehabilitation  SUBJECTIVE:                                                                                                                                                                                              SUBJECTIVE STATEMENT:    Pt reports after last therapy session she has had a significant decline in her ability to walk. Reports she only made it a short distance out of the therapy clinic and then had to sit and use her rollator to be transported to the car. States at home since then, she has only been able to walk limited distances using rollator with supervision from her husband; otherwise, she is mostly sitting. Reports she feels like her legs are going to just go out from under her. Pt reports the decline is not associated with anything performed at last therapy session, but rather was just ironic timing. Pt states she had this sort of mobility decline happen years ago and was hospitalized with the finding that she had low potassium. Denies any falls since this started. States she hasn't been to the MD regarding this.   States she has been taking the Parkinson's medication for ~1 month today.   Reports she previously had a headache a couple of days ago, but that has gone away. Pt denies low back pain nor any other pain at this time.  Reports husband found out today that he is not eligible for the cancer treatment they were hoping for.  Later in session, husband reports pt has not eaten anything all day and has overall had limited oral intake the past several days.  Pt accompanied by: self and family member (husband)  PERTINENT HISTORY: Pt is a 67y.o. female with concern for Parkinsonism due to resting tremors (R>L) with occasional whole-body jerks/myoclonus, bradykinesia, hallucinations while waking up, bilateral bradykinesia (L>R), minimal cogwheeling bilaterally, sensation of stiffness, speech changes, and imbalance (trialed Carbidopa-Levodopa, but intolerable due to side effects of severe nausea & vomiting). Pt with hx of frequent falls and bilateral LE weakness. Pt  reports hx of kidney removal on R side with large abdominal incision and  R hip replacement.  PAIN:  Are you having pain? No  PRECAUTIONS: Fall  RED FLAGS: None   WEIGHT BEARING RESTRICTIONS: No  FALLS: Has patient fallen in last 6 months? Yes. Number of falls 5-6 (2 in driveway, 1 on sidewalk)  Most recent fall last Thursday 05/23/2023, pt reports she became "disoriented" and had LOB, but no injuries- pt states in general she is "very easily distracted and then she will misstep causing her to have LOB"  LIVING ENVIRONMENT: Lives with: lives with their spouse Autry Legions) Lives in: House/apartment Stairs: Yes: Internal: reports she doesn't have to go to different floors in house (4 steps inside); and External: 3-4 steps to enter with R HRs + courtyard + additional 4-5 steps onto porch with B HRs -  entrance has steep driveway Has following equipment at home: Otho Blitz - 4 wheeled, shower bench, grabbars in bathroom, bedrail, and Ramped entry from garage (but has to go up steep driveway)  PLOF: Independent with basic ADLs, Independent with household mobility with device, Independent with household mobility without device, and Requires assistive device for independence Reports started using rollator more consistently ~2-3weeks ago but has used it on/off for a while  PATIENT GOALS: Wants to feel comfortable moving around and be shown how to do that again  OBJECTIVE:  Note: Objective measures were completed at Evaluation unless otherwise noted.  DIAGNOSTIC FINDINGS:   CT Head without Contrast 04/03/2021: Chronic atrophic changes without acute abnormality.   MRI Brain without Contrast 04/04/2021: 1. No acute intracranial abnormality. 2. Mild age-related cerebral atrophy with chronic small vessel ischemic disease. 3. 1.1 cm meningioma overlying the left frontal convexity without associated mass effect or edema.   CT Cervical Spine without Contrast 04/03/2021: Moderate severity multilevel degenerative  changes, most prominent at the levels of C4-C5, C5-C6 and C6-C7. No evidence of an acute fracture or subluxation.   COGNITION: Overall cognitive status: Within functional limits for tasks assessed   SENSATION: WFL Reassessed on 07/30/2023: WNL, denies numbness/tingling in LEs  COORDINATION: WFL for basic functional mobility tasks  EDEMA:  None observed  MUSCLE TONE: WNL  MUSCLE LENGTH: Not tested  DTRs:  Not tested  POSTURE: rounded shoulders, forward head, and increased lumbar lordosis  LOWER EXTREMITY ROM:     Passive  All WFL for basic functional mobility tasks Right Eval Left Eval  Hip flexion    Hip extension    Hip abduction    Hip adduction    Hip internal rotation    Hip external rotation    Knee flexion    Knee extension    Ankle dorsiflexion    Ankle plantarflexion    Ankle inversion    Ankle eversion     (Blank rows = not tested)  LOWER EXTREMITY MMT:    MMT Right Eval Left Eval 07/30/2023 R LE 07/30/2023 L LE  Hip flexion 3+ 4 3 3+  Hip extension      Hip abduction      Hip adduction      Hip internal rotation      Hip external rotation      Knee flexion 4- 4- 3+ 3+  Knee extension 4 4 4- 4-  Ankle dorsiflexion 4 4- 4 4-  Ankle plantarflexion 4- 4- 4- 4-  Ankle inversion      Ankle eversion      (Blank rows = not tested)  Manual Muscle Test Scale 0/5 = No muscle contraction can be seen or felt 1/5 = Contraction can  be felt, but there is no motion 2-/5 = Part moves through incomplete ROM w/ gravity decreased 2/5 = Part moves through complete ROM w/ gravity decreased 2+/5 = Part moves through incomplete ROM (<50%) against gravity or through complete ROM w/ gravity 3-/5 = Part moves through incomplete ROM (>50%) against gravity 3/5 = Part moves through complete ROM against gravity 3+/5 = Part moves through complete ROM against gravity/slight resistance 4-/5= Holds test position against slight to moderate pressure 4/5 = Part moves  through complete ROM against gravity/moderate resistance 4+/5= Holds test position against moderate to strong pressure 5/5 = Part moves through complete ROM against gravity/full resistance  BED MOBILITY:  Sit to supine Min A and requires used of bedrail Supine to sit Min A and requires used of bedrail Rolling to Right Min A Rolling to Left Min A Relies on bedrial, difficulty with lateral scooting in the bed (or in sitting)  TRANSFERS: Assistive device utilized: Environmental consultant - 4 wheeled  Sit to stand: SBA and CGA Stand to sit: SBA and CGA Chair to chair: SBA and CGA Without AD requires more consistent CGA/light min A Floor:  not assessed  RAMP:  Assistive device utilized: Environmental consultant - 4 wheeled Ramp Comments: would benefit from assessment of this when able due to steep driveway at home  CURB:   Curb Comments: Not assessed  STAIRS: Level of Assistance: CGA Stair Negotiation Technique: Step to Pattern with Bilateral Rails ascending with R LE and descending with L LE Number of Stairs: 4  Height of Stairs: 6in  Comments: Pt reports feeling more confident when having B HR support  GAIT: Gait pattern: step through pattern, decreased step length- Right, decreased step length- Left, decreased stride length, shuffling, trendelenburg, lateral hip instability, lateral lean- Right, lateral lean- Left, wide BOS, poor foot clearance- Right, and poor foot clearance- Left --- R/L lateral trunk lean over stance limb, decreased step lengths bilaterally, partial shuffled gait, wide BOS, increased balance instability Distance walked: ~9ft using HHA progressed to no UE support Assistive device utilized:  HHA progressed to no UE support Level of assistance: CGA and Min A Comments: Slow gait speed  FUNCTIONAL TESTS:  5 times sit to stand: on 06/04/2023: 59.25 seconds Timed up and go (TUG): 24.90sec without AD, 22.25 with rollator 3 minute walk test: on 06/04/2023: 250ft using rollator = 80.1meters 10  meter walk test: 36.75 seconds = 0.64m/s without AD; 06/04/23 0.65m/s average gait speed during 3 minute walk test using rollator  Berg Balance Scale: on 06/04/2023 = 31/56 Would benefit from Modified CTSIB & FGA  PATIENT SURVEYS:  ABC scale 17.5%  Details: patient 0% confident with majority of items; 50% confident household level ambulation, 40% stairs, 50% sweeping floor, 50% getting in/out of car, and 90% confident ambulating up/down ramp with rails    Vestibular Assessment  - performed on 06/07/2023 - re-assessed on 2/25 with details in that treatment note  OCULOMOTOR EXAM:  Ocular Alignment: normal  Ocular ROM: No Limitations  Spontaneous Nystagmus: absent  Gaze-Induced Nystagmus: absent and continues to have difficulty keeping eyes on target, especially towards L  Smooth Pursuits:  difficulty keeping eyes on target  Saccades: hypometric/undershoots, extra eye movements, and difficulty finding target on L side  Convergence/Divergence: not assessed Cover/Cross Cover: WNL  VESTIBULAR - OCULAR REFLEX:   VOR Cancellation: Unable to Maintain Gaze, inconsistent ability to keep eyes on target  Head-Impulse Test: HIT Right: negative HIT Left: negative -- had a difficult time keeping eyes on target in  both directions  Dynamic Visual Acuity:  would benefit from future testing    POSITIONAL TESTING: Right Dix-Hallpike: upbeating, right nystagmus Left Dix-Hallpike: benefit from testing at next visit Right Roll Test: no nystagmus Left Roll Test: no nystagmus Left Sidelying: no nystagmus  MOTION SENSITIVITY:  May benefit from testing in future visits                                                                                                   TREATMENT DATE:     07/30/23     Pt arrived to therapy clinic in transport chair.   Pt states her legs will just "give way" and she feels the "muscle just goes" from "weakness."   Re-assessed B LE MMT as noted above with slight decline in  strength noted mostly proximally; however, do not notice a functional strength change during mobility tasks in session.  R stand pivot transport chair>EOM using L HHA with CGA/light min A for stability - pt able to come to stand with no significant weakness noted in B LEs.   Educated pt on recommendation to call neurologist for follow-up appointment and to inform them of her sudden change in mobility level - reports she will call them tomorrow given the clinic likely closed at this time today.  Gait training 155ft x2 (seated break between) using rollator with CGA for safety - no significant changes in her gait mechanics compared to prior - no observable LE strength deficits noted. Pt reports she feels like she is relying more on her arms for support, but this is not obvious with visual observation of her gait and pt even able to safely let go of rollator when stepping back to sit at end of gait. Pt reports gait today felt a little more "strained" like it "required more effort."    Pt's husband reports concern that pt is not eating, stating she hasn't eaten anything all day. Pt states some of her medications make her feel nauseous and that she has significant dry mouth too (pt does have to frequently sip water throughout sessions due to dry mouth).  Therapist provided education to pt on importance of increased oral intake to ensure she is receiving adequate nutrition.     PATIENT EDUCATION:  Education details: PT POC, balance impairments and increased fall risk based on outcome measures, HEP Person educated: Patient and Spouse Education method: Explanation Education comprehension: verbalized understanding and needs further education  HOME EXERCISE PROGRAM:  Access Code: [email or web address] URL: [email or web address] Date: 07/23/2023 Prepared by: Carlen Chasten  Exercises - Walking  - 1 x daily - 7 x weekly - 3-5 sets - 3 minutes hold - Sit to Stand with Hands on Knees  - 1 x daily - 7  x weekly - 2 sets - 10 reps - Standing March with Counter Support  - 1 x daily - 7 x weekly - 2 sets - 10 reps - Supine Bridge  - 1 x daily - 7 x weekly - 2 sets - 10 reps    GOALS: Goals reviewed with patient? Yes  SHORT TERM GOALS:  Target date: 07/09/2023   Patient will be independent in home exercise program to improve strength/mobility for better functional independence with ADLs. Baseline: provided on 06/04/2023 07/09/23: pt reports performing, will need to progress Goal status: IN PROGRESS  2. Patient will improve ABC scale by 20% indicating increased confidence performing daily mobility tasks, which is associated with fall risk.    Baseline: 05/28/2023 = 17.5%;   07/09/23: need to re-assess; 3/28: 45.6%   Goal Status: MET   LONG TERM GOALS: Target date: 08/20/2023   Patient (> 44 years old) will complete five times sit to stand test in < 15 seconds indicating an increased LE strength and improved balance. Baseline: 06/04/2023: 59.25 seconds 07/09/23: 20.27 seconds using UE support on armrests 3/28: unable to complete 5 sit to stands without UE support - stopped at 3 @  34.9 seconds  Goal status: IN PROGRESS  Patient will increase Berg Balance score by > 6 points to demonstrate decreased fall risk during functional activities. Baseline: 06/04/2023: 31/56 07/09/23: 38/56 Goal Updated: Will increase Berg Balance score to >45/56 Goal status: MET and Revised on 07/09/2023   Patient will reduce timed up and go to <11 seconds to reduce fall risk and demonstrate improved transfer/gait ability. Baseline: 05/28/2023 = 22.25sec using rollator vs 24.90 seconds without AD 07/09/23: 18.35 seconds using rollator and 19.07 sec without AD Goal status: IN PROGRESS  Patient will increase 10 meter walk test to >0.30m/s as to improve gait speed for better community ambulation and to reduce fall risk. Baseline: 05/28/2023 = 0.2m/s 07/09/23: 0.59 m/s (16.90 seconds) using rollator & 0.43 m/s (22.89  seconds) without AD Goal status: IN PROGRESS  Patient will increase 3 minute walk test distance to >353ft for progression to limited community ambulation and improved gait ability Baseline: 06/04/2023 = 25ft (80.56meters) using rollator 07/09/23: need to re-assess 3/28: 290' using rollator  Goal status: IN PROGRESS   ASSESSMENT:  CLINICAL IMPRESSION:    Patient reports having a sudden decline in her functional mobility following last therapy session; however, feels there is no correlation in her decline and what was performed during therapy. Pt states she feels weakness in B LEs and states she is worried her legs will just "give way." Therapist re-assessed pt's B LE sensation and MMTs with min decline in her strength during testing; however, pt's functional strength and mobility level remains the same at [facility] using rollator as described above. Pt's husband reports pt has not eaten all day and overall has had minimal oral intake the past few days. Therapist educated pt on importance of ensuring adequate nutritional intake and recommended calling neurologist tomorrow to set-up follow-up appointment given sudden change in her mobility. Mrs. Bubel will benefit from continued skilled PT to improve these deficits in order to increase QOL, ease/safety with ADLs, decrease fall risk, and increase independence with functional mobility at household and community level.    OBJECTIVE IMPAIRMENTS: Abnormal gait, cardiopulmonary status limiting activity, decreased activity tolerance, decreased balance, decreased endurance, decreased mobility, difficulty walking, and decreased strength.   ACTIVITY LIMITATIONS: carrying, lifting, bending, standing, squatting, stairs, transfers, bed mobility, reach over head, and locomotion level  PARTICIPATION LIMITATIONS: meal prep, cleaning, laundry, shopping, and community activity  PERSONAL FACTORS: Age, Behavior pattern, Fitness, Time since onset of  injury/illness/exacerbation, and 3+ comorbidities: Parkinsonism, anxiety, arthritis, depression, HTN  are also affecting patient's functional outcome.   REHAB POTENTIAL: Good  CLINICAL DECISION MAKING: Evolving/moderate complexity  EVALUATION COMPLEXITY: Moderate  PLAN:  PT FREQUENCY: 1-2x/week  PT  DURATION: 12 weeks  PLANNED INTERVENTIONS: 97164- PT Re-evaluation, 97110-Therapeutic exercises, 97530- Therapeutic activity, 97112- Neuromuscular re-education, [phone]- Self Care, 60454- Manual therapy, [phone]- Gait training, [phone]- Canalith repositioning, Patient/Family education, Balance training, Stair training, Vestibular training, Visual/preceptual remediation/compensation, Cognitive remediation, and DME instructions   PLAN FOR NEXT SESSION:  - Nustep **follow-up on sudden decline in mobility** - follow-up on vestibular treatment from 06/28/2023 if needed - B LE functional strengthening   - adding weights/resistance training as appropriate   - continue bridges - dynamic balance and gait challenges with gaze stabilization dual-task challenges  - turning and stepping in different directions over small obstacles - dynamic reaching with turning and stepping - lateral side stepping, backwards stepping in // bars *pt very fearful of falling with balance interventions*   Draper Gallon Corinthia Dickinson, PT, DPT Physical Therapist - [facility]  [facility]  5:15 PM 07/30/23

## 2023-08-02 ENCOUNTER — Ambulatory Visit: Payer: Medicare Other | Admitting: Physical Therapy

## 2023-08-02 ENCOUNTER — Telehealth: Payer: Self-pay | Admitting: Physical Therapy

## 2023-08-02 ENCOUNTER — Encounter: Payer: Medicare Other | Admitting: Speech Pathology

## 2023-08-02 DIAGNOSIS — R262 Difficulty in walking, not elsewhere classified: Secondary | ICD-10-CM

## 2023-08-02 DIAGNOSIS — R278 Other lack of coordination: Secondary | ICD-10-CM

## 2023-08-02 DIAGNOSIS — M6281 Muscle weakness (generalized): Secondary | ICD-10-CM

## 2023-08-02 DIAGNOSIS — R42 Dizziness and giddiness: Secondary | ICD-10-CM

## 2023-08-02 DIAGNOSIS — R296 Repeated falls: Secondary | ICD-10-CM

## 2023-08-02 NOTE — Telephone Encounter (Signed)
 Therapist calling patient due to anticipating patient not showing up for appointment; however, pt arrived late for appointment.   Carlen Chasten, PT, DPT, NCS, CSRS Physical Therapist - [place]  [facility]  10:31 AM 08/02/23

## 2023-08-02 NOTE — Telephone Encounter (Deleted)
 Pt contacted via telephone and author left voice mail informing of missed appointment and informed pt of future PT appointment date and time.   Casimiro Needle, PT, DPT, NCS, CSRS

## 2023-08-02 NOTE — Therapy (Signed)
 OUTPATIENT PHYSICAL THERAPY NEURO TREATMENT   Patient Name: Brittany Benton MRN: [id number] DOB:May 26, 1955, 68 y.o., female Today's Date: 08/02/2023   PCP: [name], MD REFERRING PROVIDER: [name], MD   END OF SESSION:    PT End of Session - 08/02/23 1031     Visit Number 15   no charge visit   Number of Visits 24    Date for PT Re-Evaluation 08/20/23    Authorization Type [employer] 2025 - auth [id number]/[id number] for 16 visits    PT Start Time 1030    PT Stop Time 1054    PT Time Calculation (min) 24 min    Equipment Utilized During Treatment Gait belt    Activity Tolerance Patient tolerated treatment well;No increased pain    Behavior During Therapy [facility] for tasks assessed/performed                Past Medical History:  Diagnosis Date   Allergy 1989   Anemia    Anxiety    Arthritis    osteoarthritis -right hip   Cataract    Depression    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   GERD (gastroesophageal reflux disease)    Hernia, incisional    after renal surgery   History of chicken pox    Hyperlipidemia    Hypertension    Renal cell carcinoma (HCC)    s/p right nephrectomy 1994   Sleep apnea    Thyroid  disease    Past Surgical History:  Procedure Laterality Date   APPENDECTOMY  1994   BIOPSY  11/07/2022   Procedure: BIOPSY;  Surgeon: Therisa Bi, MD;  Location: [facility] ENDOSCOPY;  Service: Gastroenterology;;   BREAST BIOPSY Right 1991   Negative   CHOLECYSTECTOMY  1994   COLONOSCOPY WITH PROPOFOL  N/A 11/07/2022   Procedure: COLONOSCOPY WITH PROPOFOL ;  Surgeon: Therisa Bi, MD;  Location: [facility] ENDOSCOPY;  Service: Gastroenterology;  Laterality: N/A;   ESOPHAGOGASTRODUODENOSCOPY (EGD) WITH PROPOFOL  N/A 11/07/2022   Procedure: ESOPHAGOGASTRODUODENOSCOPY (EGD) WITH PROPOFOL ;  Surgeon: Therisa Bi, MD;  Location: [facility] ENDOSCOPY;  Service: Gastroenterology;  Laterality: N/A;   JOINT REPLACEMENT  2019   LAPAROSCOPIC GASTRIC SLEEVE RESECTION   02/02/2016   Dr Thedora at [facility]   NEPHRECTOMY  1994   Renal Cell Carcinoma   parotid gland removal  1990   also removed a tumor   POLYPECTOMY  11/07/2022   Procedure: POLYPECTOMY INTESTINAL;  Surgeon: Therisa Bi, MD;  Location: [facility] ENDOSCOPY;  Service: Gastroenterology;;   TONSILLECTOMY     TOTAL HIP ARTHROPLASTY Right 08/08/2016   Procedure: TOTAL HIP ARTHROPLASTY;  Surgeon: Kayla Pinal, MD;  Location: [facility] ORS;  Service: Orthopedics;  Laterality: Right;   TUBAL LIGATION  1979   Patient Active Problem List   Diagnosis Date Noted   B12 deficiency 06/11/2022   Iron deficiency anemia 04/10/2022   Morbid obesity (HCC) 03/30/2022   Vitamin A  deficiency 08/15/2021   Parkinson's disease (HCC) 08/03/2021   Acute urinary retention 08/03/2021   Meningioma (HCC) 04/05/2021   Hypothyroidism 08/15/2020   Osteopenia 06/14/2020   Status post total hip replacement, 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   Osteoarthritis of right hip 03/26/2016   H/O gastric bypass 03/26/2016   Sinus tachycardia 08/01/2015   Obesity (BMI 30-39.9) 06/08/2015   OSA (obstructive sleep apnea) 06/08/2015   GERD without esophagitis 06/08/2015   Incisional hernia 06/08/2015   Arthritis of hip 03/23/2015   Left knee pain 02/15/2015   Vitamin D  deficiency 09/29/2014   Lump of right  breast 09/22/2014   Prediabetes 09/22/2014   Pruritic dermatitis 09/22/2014   Obstructive sleep apnea 09/22/2014   Allergic rhinitis 09/15/2009   Hypersomnia 09/14/2009   Panic disorder 08/06/2008   Essential (primary) hypertension 07/16/2007   Hyperlipidemia 07/16/2007    ONSET DATE: Approximately 2 years ago  REFERRING DIAG: R26.89 (ICD-10-CM) - Balance problem   THERAPY DIAG:  Difficulty in walking, not elsewhere classified  Muscle weakness (generalized)  Repeated falls  Other lack of coordination  Dizziness and giddiness  Rationale for Evaluation and Treatment: Rehabilitation  SUBJECTIVE:                                                                                                                                                                                              SUBJECTIVE STATEMENT:    Pt reports she feels like she has been pretty much the same as since last therapy visit. Patient reports she doesn't feel comfortable walking at home. Patient reports she did call the neurology MD office and has a plan to follow-up with them on Monday, 4/21. Patient states she is wearing out and noticed after ~4 steps of walking at home, she was fatigued and needed to rest. Patient reports she drank a protein shake yesterday morning for breakfast and half a cheeseburger for lunch, but that is all she ate.  Pt reports she has not eaten anything today, but has had a cup of caffeinated coffee and water.    From last visit on 07/30/23: Pt reports after last therapy session she has had a significant decline in her ability to walk. Reports she only made it a short distance out of the therapy clinic and then had to sit and use her rollator to be transported to the car. States at home since then, she has only been able to walk limited distances using rollator with supervision from her husband; otherwise, she is mostly sitting. Reports she feels like her legs are going to just go out from under her. Pt reports the decline is not associated with anything performed at last therapy session, but rather was just ironic timing. Pt states she had this sort of mobility decline happen years ago and was hospitalized with the finding that she had low potassium. Denies any falls since this started. States she hasn't been to the MD regarding this.   States she has been taking the Parkinson's medication for ~1 month today.   Reports she previously had a headache a couple of days ago, but that has gone away. Pt denies low back pain nor any other pain at this time.  Reports husband found out today  that he is not eligible for the cancer treatment they  were hoping for.  Later in session, husband reports pt has not eaten anything all day and has overall had limited oral intake the past several days.  Pt accompanied by: self and family member (husband)  PERTINENT HISTORY: Pt is a 67y.o. female with concern for Parkinsonism due to resting tremors (R>L) with occasional whole-body jerks/myoclonus, bradykinesia, hallucinations while waking up, bilateral bradykinesia (L>R), minimal cogwheeling bilaterally, sensation of stiffness, speech changes, and imbalance (trialed Carbidopa -Levodopa , but intolerable due to side effects of severe nausea & vomiting). Pt with hx of frequent falls and bilateral LE weakness. Pt reports hx of kidney removal on R side with large abdominal incision and R hip replacement.  PAIN:  Are you having pain? No  PRECAUTIONS: Fall  RED FLAGS: None   WEIGHT BEARING RESTRICTIONS: No  FALLS: Has patient fallen in last 6 months? Yes. Number of falls 5-6 (2 in driveway, 1 on sidewalk)  Most recent fall last Thursday 05/23/2023, pt reports she became disoriented and had LOB, but no injuries- pt states in general she is very easily distracted and then she will misstep causing her to have LOB  LIVING ENVIRONMENT: Lives with: lives with their spouse Vilinda) Lives in: House/apartment Stairs: Yes: Internal: reports she doesn't have to go to different floors in house (4 steps inside); and External: 3-4 steps to enter with R HRs + courtyard + additional 4-5 steps onto porch with B HRs -  entrance has steep driveway Has following equipment at home: Vannie - 4 wheeled, shower bench, grabbars in bathroom, bedrail, and Ramped entry from garage (but has to go up steep driveway)  PLOF: Independent with basic ADLs, Independent with household mobility with device, Independent with household mobility without device, and Requires assistive device for independence Reports started using rollator more consistently ~2-3weeks ago but has used it on/off  for a while  PATIENT GOALS: Wants to feel comfortable moving around and be shown how to do that again  OBJECTIVE:  Note: Objective measures were completed at Evaluation unless otherwise noted.  DIAGNOSTIC FINDINGS:   CT Head without Contrast 04/03/2021: Chronic atrophic changes without acute abnormality.   MRI Brain without Contrast 04/04/2021: 1. No acute intracranial abnormality. 2. Mild age-related cerebral atrophy with chronic small vessel ischemic disease. 3. 1.1 cm meningioma overlying the left frontal convexity without associated mass effect or edema.   CT Cervical Spine without Contrast 04/03/2021: Moderate severity multilevel degenerative changes, most prominent at the levels of C4-C5, C5-C6 and C6-C7. No evidence of an acute fracture or subluxation.   COGNITION: Overall cognitive status: Within functional limits for tasks assessed   SENSATION: WFL Reassessed on 07/30/2023: WNL, denies numbness/tingling in LEs  COORDINATION: WFL for basic functional mobility tasks  EDEMA:  None observed  MUSCLE TONE: WNL  MUSCLE LENGTH: Not tested  DTRs:  Not tested  POSTURE: rounded shoulders, forward head, and increased lumbar lordosis  LOWER EXTREMITY ROM:     Passive  All WFL for basic functional mobility tasks Right Eval Left Eval  Hip flexion    Hip extension    Hip abduction    Hip adduction    Hip internal rotation    Hip external rotation    Knee flexion    Knee extension    Ankle dorsiflexion    Ankle plantarflexion    Ankle inversion    Ankle eversion     (Blank rows = not tested)  LOWER EXTREMITY MMT:  MMT Right Eval Left Eval 07/30/2023 R LE 07/30/2023 L LE  Hip flexion 3+ 4 3 3+  Hip extension      Hip abduction      Hip adduction      Hip internal rotation      Hip external rotation      Knee flexion 4- 4- 3+ 3+  Knee extension 4 4 4- 4-  Ankle dorsiflexion 4 4- 4 4-  Ankle plantarflexion 4- 4- 4- 4-  Ankle inversion      Ankle  eversion      (Blank rows = not tested)  Manual Muscle Test Scale 0/5 = No muscle contraction can be seen or felt 1/5 = Contraction can be felt, but there is no motion 2-/5 = Part moves through incomplete ROM w/ gravity decreased 2/5 = Part moves through complete ROM w/ gravity decreased 2+/5 = Part moves through incomplete ROM (<50%) against gravity or through complete ROM w/ gravity 3-/5 = Part moves through incomplete ROM (>50%) against gravity 3/5 = Part moves through complete ROM against gravity 3+/5 = Part moves through complete ROM against gravity/slight resistance 4-/5= Holds test position against slight to moderate pressure 4/5 = Part moves through complete ROM against gravity/moderate resistance 4+/5= Holds test position against moderate to strong pressure 5/5 = Part moves through complete ROM against gravity/full resistance  BED MOBILITY:  Sit to supine Min A and requires used of bedrail Supine to sit Min A and requires used of bedrail Rolling to Right Min A Rolling to Left Min A Relies on bedrial, difficulty with lateral scooting in the bed (or in sitting)  TRANSFERS: Assistive device utilized: Environmental Consultant - 4 wheeled  Sit to stand: SBA and CGA Stand to sit: SBA and CGA Chair to chair: SBA and CGA Without AD requires more consistent CGA/light min A Floor:  not assessed  RAMP:  Assistive device utilized: Environmental Consultant - 4 wheeled Ramp Comments: would benefit from assessment of this when able due to steep driveway at home  CURB:   Curb Comments: Not assessed  STAIRS: Level of Assistance: CGA Stair Negotiation Technique: Step to Pattern with Bilateral Rails ascending with R LE and descending with L LE Number of Stairs: 4  Height of Stairs: 6in  Comments: Pt reports feeling more confident when having B HR support  GAIT: Gait pattern: step through pattern, decreased step length- Right, decreased step length- Left, decreased stride length, shuffling, trendelenburg, lateral  hip instability, lateral lean- Right, lateral lean- Left, wide BOS, poor foot clearance- Right, and poor foot clearance- Left --- R/L lateral trunk lean over stance limb, decreased step lengths bilaterally, partial shuffled gait, wide BOS, increased balance instability Distance walked: ~40ft using HHA progressed to no UE support Assistive device utilized:  HHA progressed to no UE support Level of assistance: CGA and Min A Comments: Slow gait speed  FUNCTIONAL TESTS:  5 times sit to stand: on 06/04/2023: 59.25 seconds Timed up and go (TUG): 24.90sec without AD, 22.25 with rollator 3 minute walk test: on 06/04/2023: 269ft using rollator = 80.51meters 10 meter walk test: 36.75 seconds = 0.26m/s without AD; 06/04/23 0.4m/s average gait speed during 3 minute walk test using rollator  Berg Balance Scale: on 06/04/2023 = 31/56 Would benefit from Modified CTSIB & FGA  PATIENT SURVEYS:  ABC scale 17.5%  Details: patient 0% confident with majority of items; 50% confident household level ambulation, 40% stairs, 50% sweeping floor, 50% getting in/out of car, and 90% confident ambulating up/down ramp  with rails    Vestibular Assessment  - performed on 06/07/2023 - re-assessed on 2/25 with details in that treatment note  OCULOMOTOR EXAM:  Ocular Alignment: normal  Ocular ROM: No Limitations  Spontaneous Nystagmus: absent  Gaze-Induced Nystagmus: absent and continues to have difficulty keeping eyes on target, especially towards L  Smooth Pursuits:  difficulty keeping eyes on target  Saccades: hypometric/undershoots, extra eye movements, and difficulty finding target on L side  Convergence/Divergence: not assessed Cover/Cross Cover: WNL  VESTIBULAR - OCULAR REFLEX:   VOR Cancellation: Unable to Maintain Gaze, inconsistent ability to keep eyes on target  Head-Impulse Test: HIT Right: negative HIT Left: negative -- had a difficult time keeping eyes on target in both directions  Dynamic Visual Acuity:   would benefit from future testing    POSITIONAL TESTING: Right Dix-Hallpike: upbeating, right nystagmus Left Dix-Hallpike: benefit from testing at next visit Right Roll Test: no nystagmus Left Roll Test: no nystagmus Left Sidelying: no nystagmus  MOTION SENSITIVITY:  May benefit from testing in future visits                                                                                                   TREATMENT DATE:     08/02/23    Pt arrived to therapy clinic in transport chair.  Vitals in sitting: BP 109/78 (MAP 88), HR 90bpm, SpO2 100%  Therapist reinforced education to pt on importance of increased oral intake to ensure she is receiving adequate nutrition. Patient reports the food doesn't taste good. Patient's husband states having a challenging time motivating patient to eat.    PATIENT EDUCATION:  Education details: PT POC, balance impairments and increased fall risk based on outcome measures, HEP Person educated: Patient and Spouse Education method: Explanation Education comprehension: verbalized understanding and needs further education  HOME EXERCISE PROGRAM:  Access Code: [email or web address] URL: [email or web address] Date: 07/23/2023 Prepared by: Connell Kiss  Exercises - Walking  - 1 x daily - 7 x weekly - 3-5 sets - 3 minutes hold - Sit to Stand with Hands on Knees  - 1 x daily - 7 x weekly - 2 sets - 10 reps - Standing March with Counter Support  - 1 x daily - 7 x weekly - 2 sets - 10 reps - Supine Bridge  - 1 x daily - 7 x weekly - 2 sets - 10 reps    GOALS: Goals reviewed with patient? Yes  SHORT TERM GOALS: Target date: 07/09/2023   Patient will be independent in home exercise program to improve strength/mobility for better functional independence with ADLs. Baseline: provided on 06/04/2023 07/09/23: pt reports performing, will need to progress Goal status: IN PROGRESS  2. Patient will improve ABC scale by 20% indicating increased confidence  performing daily mobility tasks, which is associated with fall risk.    Baseline: 05/28/2023 = 17.5%;   07/09/23: need to re-assess; 3/28: 45.6%   Goal Status: MET   LONG TERM GOALS: Target date: 08/20/2023   Patient (> 45 years old) will complete five times sit to stand test in < 15 seconds indicating  an increased LE strength and improved balance. Baseline: 06/04/2023: 59.25 seconds 07/09/23: 20.27 seconds using UE support on armrests 3/28: unable to complete 5 sit to stands without UE support - stopped at 3 @  34.9 seconds  Goal status: IN PROGRESS  Patient will increase Berg Balance score by > 6 points to demonstrate decreased fall risk during functional activities. Baseline: 06/04/2023: 31/56 07/09/23: 38/56 Goal Updated: Will increase Berg Balance score to >45/56 Goal status: MET and Revised on 07/09/2023   Patient will reduce timed up and go to <11 seconds to reduce fall risk and demonstrate improved transfer/gait ability. Baseline: 05/28/2023 = 22.25sec using rollator vs 24.90 seconds without AD 07/09/23: 18.35 seconds using rollator and 19.07 sec without AD Goal status: IN PROGRESS  Patient will increase 10 meter walk test to >0.46m/s as to improve gait speed for better community ambulation and to reduce fall risk. Baseline: 05/28/2023 = 0.51m/s 07/09/23: 0.59 m/s (16.90 seconds) using rollator & 0.43 m/s (22.89 seconds) without AD Goal status: IN PROGRESS  Patient will increase 3 minute walk test distance to >375ft for progression to limited community ambulation and improved gait ability Baseline: 06/04/2023 = 217ft (80.61meters) using rollator 07/09/23: need to re-assess 3/28: 290' using rollator  Goal status: IN PROGRESS   ASSESSMENT:  CLINICAL IMPRESSION:  No charge visit this date due to patient continuing to have sudden decreased mobility that started on 07/26/2023. Vitals WNL. Patient planning to follow-up with neurology MD on Monday 4/21 and will return for continued physical  therapy on 4/22.  OBJECTIVE IMPAIRMENTS: Abnormal gait, cardiopulmonary status limiting activity, decreased activity tolerance, decreased balance, decreased endurance, decreased mobility, difficulty walking, and decreased strength.   ACTIVITY LIMITATIONS: carrying, lifting, bending, standing, squatting, stairs, transfers, bed mobility, reach over head, and locomotion level  PARTICIPATION LIMITATIONS: meal prep, cleaning, laundry, shopping, and community activity  PERSONAL FACTORS: Age, Behavior pattern, Fitness, Time since onset of injury/illness/exacerbation, and 3+ comorbidities: Parkinsonism, anxiety, arthritis, depression, HTN  are also affecting patient's functional outcome.   REHAB POTENTIAL: Good  CLINICAL DECISION MAKING: Evolving/moderate complexity  EVALUATION COMPLEXITY: Moderate  PLAN:  PT FREQUENCY: 1-2x/week  PT DURATION: 12 weeks  PLANNED INTERVENTIONS: [id number]- PT Re-evaluation, 97110-Therapeutic exercises, 97530- Therapeutic activity, 97112- Neuromuscular re-education, 97535- Self Care, [id number]- Manual therapy, [phone]- Gait training, [phone]- Canalith repositioning, Patient/Family education, Balance training, Stair training, Vestibular training, Visual/preceptual remediation/compensation, Cognitive remediation, and DME instructions   PLAN FOR NEXT SESSION:  - Nustep **follow-up on sudden decline in mobility** - follow-up on vestibular treatment from 06/28/2023 if needed - B LE functional strengthening   - adding weights/resistance training as appropriate   - continue bridges - dynamic balance and gait challenges with gaze stabilization dual-task challenges  - turning and stepping in different directions over small obstacles - dynamic reaching with turning and stepping - lateral side stepping, backwards stepping in // bars *pt very fearful of falling with balance interventions*   Epimenio Schetter CHRISTELLA Kiss, PT, DPT Physical Therapist - [facility]  [facility]   10:57 AM 08/02/23

## 2023-08-05 ENCOUNTER — Other Ambulatory Visit: Payer: Self-pay | Admitting: Family Medicine

## 2023-08-05 DIAGNOSIS — E785 Hyperlipidemia, unspecified: Secondary | ICD-10-CM

## 2023-08-06 ENCOUNTER — Encounter: Payer: Medicare Other | Admitting: Speech Pathology

## 2023-08-06 ENCOUNTER — Inpatient Hospital Stay: Attending: Internal Medicine

## 2023-08-06 ENCOUNTER — Ambulatory Visit: Payer: Medicare Other | Admitting: Physical Therapy

## 2023-08-06 DIAGNOSIS — R296 Repeated falls: Secondary | ICD-10-CM | POA: Diagnosis not present

## 2023-08-06 DIAGNOSIS — E46 Unspecified protein-calorie malnutrition: Secondary | ICD-10-CM | POA: Diagnosis not present

## 2023-08-06 DIAGNOSIS — D509 Iron deficiency anemia, unspecified: Secondary | ICD-10-CM

## 2023-08-06 DIAGNOSIS — E538 Deficiency of other specified B group vitamins: Secondary | ICD-10-CM | POA: Insufficient documentation

## 2023-08-06 DIAGNOSIS — R262 Difficulty in walking, not elsewhere classified: Secondary | ICD-10-CM | POA: Diagnosis not present

## 2023-08-06 DIAGNOSIS — R278 Other lack of coordination: Secondary | ICD-10-CM | POA: Diagnosis not present

## 2023-08-06 DIAGNOSIS — R42 Dizziness and giddiness: Secondary | ICD-10-CM

## 2023-08-06 DIAGNOSIS — M6281 Muscle weakness (generalized): Secondary | ICD-10-CM | POA: Diagnosis not present

## 2023-08-06 DIAGNOSIS — R29898 Other symptoms and signs involving the musculoskeletal system: Secondary | ICD-10-CM | POA: Diagnosis not present

## 2023-08-06 DIAGNOSIS — G20C Parkinsonism, unspecified: Secondary | ICD-10-CM | POA: Diagnosis not present

## 2023-08-06 MED ORDER — CYANOCOBALAMIN 1000 MCG/ML IJ SOLN
1000.0000 ug | Freq: Once | INTRAMUSCULAR | Status: AC
  Administered 2023-08-06: 1000 ug via INTRAMUSCULAR
  Filled 2023-08-06: qty 1

## 2023-08-06 NOTE — Therapy (Signed)
 OUTPATIENT PHYSICAL THERAPY NEURO TREATMENT   Patient Name: Brittany Benton MRN: [id number] DOB:05/20/1955, 68 y.o., female Today's Date: 08/06/2023   PCP: Lamon Pillow, MD REFERRING PROVIDER: Rosan Comfort, MD   END OF SESSION:    PT End of Session - 08/06/23 1619     Visit Number 16    Number of Visits 24    Date for PT Re-Evaluation 08/20/23    Authorization Type [employer] 2025 - auth [id number] for 16 visits    PT Start Time 1619    PT Stop Time 1658    PT Time Calculation (min) 39 min    Equipment Utilized During Treatment Gait belt    Activity Tolerance Patient tolerated treatment well    Behavior During Therapy [facility] for tasks assessed/performed                 Past Medical History:  Diagnosis Date   Allergy 1989   Anemia    Anxiety    Arthritis    osteoarthritis -right hip   Cataract    Depression    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   GERD (gastroesophageal reflux disease)    Hernia, incisional    after renal surgery   History of chicken pox    Hyperlipidemia    Hypertension    Renal cell carcinoma (HCC)    s/p right nephrectomy 1994   Sleep apnea    Thyroid  disease    Past Surgical History:  Procedure Laterality Date   APPENDECTOMY  1994   BIOPSY  11/07/2022   Procedure: BIOPSY;  Surgeon: Luke Salaam, MD;  Location: [facility] ENDOSCOPY;  Service: Gastroenterology;;   BREAST BIOPSY Right 1991   Negative   CHOLECYSTECTOMY  1994   COLONOSCOPY WITH PROPOFOL  N/A 11/07/2022   Procedure: COLONOSCOPY WITH PROPOFOL ;  Surgeon: Luke Salaam, MD;  Location: [facility] ENDOSCOPY;  Service: Gastroenterology;  Laterality: N/A;   ESOPHAGOGASTRODUODENOSCOPY (EGD) WITH PROPOFOL  N/A 11/07/2022   Procedure: ESOPHAGOGASTRODUODENOSCOPY (EGD) WITH PROPOFOL ;  Surgeon: Luke Salaam, MD;  Location: [facility] ENDOSCOPY;  Service: Gastroenterology;  Laterality: N/A;   JOINT REPLACEMENT  2019   LAPAROSCOPIC GASTRIC SLEEVE RESECTION  02/02/2016   Dr Ovid Blow at [facility]    NEPHRECTOMY  1994   Renal Cell Carcinoma   parotid gland removal  1990   also removed a tumor   POLYPECTOMY  11/07/2022   Procedure: POLYPECTOMY INTESTINAL;  Surgeon: Luke Salaam, MD;  Location: [facility] ENDOSCOPY;  Service: Gastroenterology;;   TONSILLECTOMY     TOTAL HIP ARTHROPLASTY Right 08/08/2016   Procedure: TOTAL HIP ARTHROPLASTY;  Surgeon: Marlynn Singer, MD;  Location: [facility] ORS;  Service: Orthopedics;  Laterality: Right;   TUBAL LIGATION  1979   Patient Active Problem List   Diagnosis Date Noted   B12 deficiency 06/11/2022   Iron deficiency anemia 04/10/2022   Morbid obesity (HCC) 03/30/2022   Vitamin A  deficiency 08/15/2021   Parkinson's disease (HCC) 08/03/2021   Acute urinary retention 08/03/2021   Meningioma (HCC) 04/05/2021   Hypothyroidism 08/15/2020   Osteopenia 06/14/2020   Status post total hip replacement, 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   Osteoarthritis of right hip 03/26/2016   H/O gastric bypass 03/26/2016   Sinus tachycardia 08/01/2015   Obesity (BMI 30-39.9) 06/08/2015   OSA (obstructive sleep apnea) 06/08/2015   GERD without esophagitis 06/08/2015   Incisional hernia 06/08/2015   Arthritis of hip 03/23/2015   Left knee pain 02/15/2015   Vitamin D  deficiency 09/29/2014   Lump of right breast 09/22/2014   Prediabetes  09/22/2014   Pruritic dermatitis 09/22/2014   Obstructive sleep apnea 09/22/2014   Allergic rhinitis 09/15/2009   Hypersomnia 09/14/2009   Panic disorder 08/06/2008   Essential (primary) hypertension 07/16/2007   Hyperlipidemia 07/16/2007    ONSET DATE: Approximately 2 years ago  REFERRING DIAG: R26.89 (ICD-10-CM) - Balance problem   THERAPY DIAG:  Difficulty in walking, not elsewhere classified  Muscle weakness (generalized)  Repeated falls  Other lack of coordination  Dizziness and giddiness  Rationale for Evaluation and Treatment: Rehabilitation  SUBJECTIVE:                                                                                                                                                                                              SUBJECTIVE STATEMENT:    Pt reports she just left Dr. Carlis Cherry office to come to therapy appointment. Pt reports Dr. Mason Sole said the muscle weakness is part of the Parkinson's disease and that when the pt walks and feels the fatigue overcoming her, then she needs to sit and rest. Pt reports she has lost close to 30lbs in the last few months. Pt states she is aware that she needs to start "eating better."  Pt reports her sinemet was increased to 1 pill in AM and 2 pills in PM. Pt reports feeling overall much better after her visit with Dr. Mason Sole. Pt reports she now has planned biopsies to rule out "any other potential diseases."  Pt reports she had a B12 infusion today too and is scheduled to have them every month.  Pt reports she has hallucinations and states she always sees insects in a line around the ceiling when she wakes up - states this is not new and that it does not upset her.     From last visit on 07/30/23: Pt reports after last therapy session she has had a significant decline in her ability to walk. Reports she only made it a short distance out of the therapy clinic and then had to sit and use her rollator to be transported to the car. States at home since then, she has only been able to walk limited distances using rollator with supervision from her husband; otherwise, she is mostly sitting. Reports she feels like her legs are going to just go out from under her. Pt reports the decline is not associated with anything performed at last therapy session, but rather was just ironic timing. Pt states she had this sort of mobility decline happen years ago and was hospitalized with the finding that she had low potassium. Denies any falls since this started. States she hasn't been to the  MD regarding this.   States she has been taking the Parkinson's medication for ~1 month today.   Reports  she previously had a headache a couple of days ago, but that has gone away. Pt denies low back pain nor any other pain at this time.  Reports husband found out today that he is not eligible for the cancer treatment they were hoping for.  Later in session, husband reports pt has not eaten anything all day and has overall had limited oral intake the past several days.  Pt accompanied by: self and family member (husband)  PERTINENT HISTORY: Pt is a 67y.o. female with concern for Parkinsonism due to resting tremors (R>L) with occasional whole-body jerks/myoclonus, bradykinesia, hallucinations while waking up, bilateral bradykinesia (L>R), minimal cogwheeling bilaterally, sensation of stiffness, speech changes, and imbalance (trialed Carbidopa-Levodopa, but intolerable due to side effects of severe nausea & vomiting). Pt with hx of frequent falls and bilateral LE weakness. Pt reports hx of kidney removal on R side with large abdominal incision and R hip replacement.  PAIN:  Are you having pain? No  PRECAUTIONS: Fall  RED FLAGS: None   WEIGHT BEARING RESTRICTIONS: No  FALLS: Has patient fallen in last 6 months? Yes. Number of falls 5-6 (2 in driveway, 1 on sidewalk)  Most recent fall last Thursday 05/23/2023, pt reports she became "disoriented" and had LOB, but no injuries- pt states in general she is "very easily distracted and then she will misstep causing her to have LOB"  LIVING ENVIRONMENT: Lives with: lives with their spouse Autry Legions) Lives in: House/apartment Stairs: Yes: Internal: reports she doesn't have to go to different floors in house (4 steps inside); and External: 3-4 steps to enter with R HRs + courtyard + additional 4-5 steps onto porch with B HRs -  entrance has steep driveway Has following equipment at home: Otho Blitz - 4 wheeled, shower bench, grabbars in bathroom, bedrail, and Ramped entry from garage (but has to go up steep driveway)  PLOF: Independent with basic ADLs, Independent  with household mobility with device, Independent with household mobility without device, and Requires assistive device for independence Reports started using rollator more consistently ~2-3weeks ago but has used it on/off for a while  PATIENT GOALS: Wants to feel comfortable moving around and be shown how to do that again  OBJECTIVE:  Note: Objective measures were completed at Evaluation unless otherwise noted.  DIAGNOSTIC FINDINGS:   CT Head without Contrast 04/03/2021: Chronic atrophic changes without acute abnormality.   MRI Brain without Contrast 04/04/2021: 1. No acute intracranial abnormality. 2. Mild age-related cerebral atrophy with chronic small vessel ischemic disease. 3. 1.1 cm meningioma overlying the left frontal convexity without associated mass effect or edema.   CT Cervical Spine without Contrast 04/03/2021: Moderate severity multilevel degenerative changes, most prominent at the levels of C4-C5, C5-C6 and C6-C7. No evidence of an acute fracture or subluxation.   COGNITION: Overall cognitive status: Within functional limits for tasks assessed   SENSATION: WFL Reassessed on 07/30/2023: WNL, denies numbness/tingling in LEs  COORDINATION: WFL for basic functional mobility tasks  EDEMA:  None observed  MUSCLE TONE: WNL  MUSCLE LENGTH: Not tested  DTRs:  Not tested  POSTURE: rounded shoulders, forward head, and increased lumbar lordosis  LOWER EXTREMITY ROM:     Passive  All WFL for basic functional mobility tasks Right Eval Left Eval  Hip flexion    Hip extension    Hip abduction    Hip adduction    Hip  internal rotation    Hip external rotation    Knee flexion    Knee extension    Ankle dorsiflexion    Ankle plantarflexion    Ankle inversion    Ankle eversion     (Blank rows = not tested)  LOWER EXTREMITY MMT:    MMT Right Eval Left Eval 07/30/2023 R LE 07/30/2023 L LE  Hip flexion 3+ 4 3 3+  Hip extension      Hip abduction      Hip  adduction      Hip internal rotation      Hip external rotation      Knee flexion 4- 4- 3+ 3+  Knee extension 4 4 4- 4-  Ankle dorsiflexion 4 4- 4 4-  Ankle plantarflexion 4- 4- 4- 4-  Ankle inversion      Ankle eversion      (Blank rows = not tested)  Manual Muscle Test Scale 0/5 = No muscle contraction can be seen or felt 1/5 = Contraction can be felt, but there is no motion 2-/5 = Part moves through incomplete ROM w/ gravity decreased 2/5 = Part moves through complete ROM w/ gravity decreased 2+/5 = Part moves through incomplete ROM (<50%) against gravity or through complete ROM w/ gravity 3-/5 = Part moves through incomplete ROM (>50%) against gravity 3/5 = Part moves through complete ROM against gravity 3+/5 = Part moves through complete ROM against gravity/slight resistance 4-/5= Holds test position against slight to moderate pressure 4/5 = Part moves through complete ROM against gravity/moderate resistance 4+/5= Holds test position against moderate to strong pressure 5/5 = Part moves through complete ROM against gravity/full resistance  BED MOBILITY:  Sit to supine Min A and requires used of bedrail Supine to sit Min A and requires used of bedrail Rolling to Right Min A Rolling to Left Min A Relies on bedrial, difficulty with lateral scooting in the bed (or in sitting)  TRANSFERS: Assistive device utilized: Environmental consultant - 4 wheeled  Sit to stand: SBA and CGA Stand to sit: SBA and CGA Chair to chair: SBA and CGA Without AD requires more consistent CGA/light min A Floor:  not assessed  RAMP:  Assistive device utilized: Environmental consultant - 4 wheeled Ramp Comments: would benefit from assessment of this when able due to steep driveway at home  CURB:   Curb Comments: Not assessed  STAIRS: Level of Assistance: CGA Stair Negotiation Technique: Step to Pattern with Bilateral Rails ascending with R LE and descending with L LE Number of Stairs: 4  Height of Stairs: 6in  Comments: Pt  reports feeling more confident when having B HR support  GAIT: Gait pattern: step through pattern, decreased step length- Right, decreased step length- Left, decreased stride length, shuffling, trendelenburg, lateral hip instability, lateral lean- Right, lateral lean- Left, wide BOS, poor foot clearance- Right, and poor foot clearance- Left --- R/L lateral trunk lean over stance limb, decreased step lengths bilaterally, partial shuffled gait, wide BOS, increased balance instability Distance walked: ~13ft using HHA progressed to no UE support Assistive device utilized:  HHA progressed to no UE support Level of assistance: CGA and Min A Comments: Slow gait speed  FUNCTIONAL TESTS:  5 times sit to stand: on 06/04/2023: 59.25 seconds Timed up and go (TUG): 24.90sec without AD, 22.25 with rollator 3 minute walk test: on 06/04/2023: 268ft using rollator = 80.83meters 10 meter walk test: 36.75 seconds = 0.43m/s without AD; 06/04/23 0.52m/s average gait speed during 3 minute walk test  using rollator  Berg Balance Scale: on 06/04/2023 = 31/56 Would benefit from Modified CTSIB & FGA  PATIENT SURVEYS:  ABC scale 17.5%  Details: patient 0% confident with majority of items; 50% confident household level ambulation, 40% stairs, 50% sweeping floor, 50% getting in/out of car, and 90% confident ambulating up/down ramp with rails    Vestibular Assessment  - performed on 06/07/2023 - re-assessed on 2/25 with details in that treatment note  OCULOMOTOR EXAM:  Ocular Alignment: normal  Ocular ROM: No Limitations  Spontaneous Nystagmus: absent  Gaze-Induced Nystagmus: absent and continues to have difficulty keeping eyes on target, especially towards L  Smooth Pursuits:  difficulty keeping eyes on target  Saccades: hypometric/undershoots, extra eye movements, and difficulty finding target on L side  Convergence/Divergence: not assessed Cover/Cross Cover: WNL  VESTIBULAR - OCULAR REFLEX:   VOR Cancellation:  Unable to Maintain Gaze, inconsistent ability to keep eyes on target  Head-Impulse Test: HIT Right: negative HIT Left: negative -- had a difficult time keeping eyes on target in both directions  Dynamic Visual Acuity:  would benefit from future testing    POSITIONAL TESTING: Right Dix-Hallpike: upbeating, right nystagmus Left Dix-Hallpike: benefit from testing at next visit Right Roll Test: no nystagmus Left Roll Test: no nystagmus Left Sidelying: no nystagmus  MOTION SENSITIVITY:  May benefit from testing in future visits                                                                                                   TREATMENT DATE:     08/06/23   Sit<>stands using rollator with CGA/close SBA for safety throughout session.  Gait training ~78ft to Nustep using rollator with CGA for safety, but no instability noted, and pt achieving reciprocal stepping pattern although slow gait speed.  B UE and B LE reciprocal movement pattern retraining on Nustep for strength and endurance training against level 2 resistance for 3 min, increased to level 3 for 3 minutes totaling 6 minutes and 243 steps - educated pt to maintain SPM (steps per minute) >45 with pt sustaining it ~50% of the time due to becoming distracted.  Gait training 19ft + 176ft (seated break between) using rollator with CGA for safety - continues to achieve reciprocal stepping pattern although slow gait speed - does have slight R/L rollator movement with each step due to pt using it for balance stability, but not causing patient to become unstable as this is her typical gait using rollator. Focusing on increasing gait endurance as well as increasing patient's confidence with ambulation.   Pt reports she feels safe trying to do more ambulation at home using rollator following today.   PATIENT EDUCATION:  Education details: PT POC, balance impairments and increased fall risk based on outcome measures, HEP Person educated: Patient  and Spouse Education method: Explanation Education comprehension: verbalized understanding and needs further education  HOME EXERCISE PROGRAM:  Access Code: [email or web address] URL: [email or web address] Date: 07/23/2023 Prepared by: Carlen Chasten  Exercises - Walking  - 1 x daily - 7 x weekly - 3-5 sets - 3 minutes hold - Sit to  Stand with Hands on Knees  - 1 x daily - 7 x weekly - 2 sets - 10 reps - Standing March with Counter Support  - 1 x daily - 7 x weekly - 2 sets - 10 reps - Supine Bridge  - 1 x daily - 7 x weekly - 2 sets - 10 reps    GOALS: Goals reviewed with patient? Yes  SHORT TERM GOALS: Target date: 07/09/2023   Patient will be independent in home exercise program to improve strength/mobility for better functional independence with ADLs. Baseline: provided on 06/04/2023 07/09/23: pt reports performing, will need to progress Goal status: IN PROGRESS  2. Patient will improve ABC scale by 20% indicating increased confidence performing daily mobility tasks, which is associated with fall risk.    Baseline: 05/28/2023 = 17.5%;   07/09/23: need to re-assess; 3/28: 45.6%   Goal Status: MET   LONG TERM GOALS: Target date: 08/20/2023   Patient (> 43 years old) will complete five times sit to stand test in < 15 seconds indicating an increased LE strength and improved balance. Baseline: 06/04/2023: 59.25 seconds 07/09/23: 20.27 seconds using UE support on armrests 3/28: unable to complete 5 sit to stands without UE support - stopped at 3 @  34.9 seconds  Goal status: IN PROGRESS  Patient will increase Berg Balance score by > 6 points to demonstrate decreased fall risk during functional activities. Baseline: 06/04/2023: 31/56 07/09/23: 38/56 Goal Updated: Will increase Berg Balance score to >45/56 Goal status: MET and Revised on 07/09/2023   Patient will reduce timed up and go to <11 seconds to reduce fall risk and demonstrate improved transfer/gait ability. Baseline:  05/28/2023 = 22.25sec using rollator vs 24.90 seconds without AD 07/09/23: 18.35 seconds using rollator and 19.07 sec without AD Goal status: IN PROGRESS  Patient will increase 10 meter walk test to >0.34m/s as to improve gait speed for better community ambulation and to reduce fall risk. Baseline: 05/28/2023 = 0.8m/s 07/09/23: 0.59 m/s (16.90 seconds) using rollator & 0.43 m/s (22.89 seconds) without AD Goal status: IN PROGRESS  Patient will increase 3 minute walk test distance to >325ft for progression to limited community ambulation and improved gait ability Baseline: 06/04/2023 = 250ft (80.58meters) using rollator 07/09/23: need to re-assess 3/28: 290' using rollator  Goal status: IN PROGRESS   ASSESSMENT:  CLINICAL IMPRESSION:   Patient arrived with overall increased spirits and states she feels much better after appointment with Dr. Mason Sole today. Patient excited to return to participation in physical therapy and progressively return to the LOF she was at prior to recent mobility decline. Patient participated in interventions targeting activity tolerance, functional strengthening, and gait endurance with good response. Patient provided seated rest breaks between interventions today as she improves her endurance. Ms. Mcclay will benefit from further skilled PT to improve these deficits in order to increase QOL, decrease fal risk, and ease/safety with ADLs.   OBJECTIVE IMPAIRMENTS: Abnormal gait, cardiopulmonary status limiting activity, decreased activity tolerance, decreased balance, decreased endurance, decreased mobility, difficulty walking, and decreased strength.   ACTIVITY LIMITATIONS: carrying, lifting, bending, standing, squatting, stairs, transfers, bed mobility, reach over head, and locomotion level  PARTICIPATION LIMITATIONS: meal prep, cleaning, laundry, shopping, and community activity  PERSONAL FACTORS: Age, Behavior pattern, Fitness, Time since onset of  injury/illness/exacerbation, and 3+ comorbidities: Parkinsonism, anxiety, arthritis, depression, HTN  are also affecting patient's functional outcome.   REHAB POTENTIAL: Good  CLINICAL DECISION MAKING: Evolving/moderate complexity  EVALUATION COMPLEXITY: Moderate  PLAN:  PT FREQUENCY:  1-2x/week  PT DURATION: 12 weeks  PLANNED INTERVENTIONS: 97164- PT Re-evaluation, 97110-Therapeutic exercises, 97530- Therapeutic activity, 97112- Neuromuscular re-education, [phone]- Self Care, [id number]- Manual therapy, [phone]- Gait training, [phone]- Canalith repositioning, Patient/Family education, Balance training, Stair training, Vestibular training, Visual/preceptual remediation/compensation, Cognitive remediation, and DME instructions   PLAN FOR NEXT SESSION:  - Nustep - progressive increase in activity following recent, sudden decline in mobility on 07/26/2023 - follow-up on vestibular treatment from 06/28/2023 if needed - B LE functional strengthening   - adding weights/resistance training as appropriate   - continue bridges - dynamic balance and gait challenges with gaze stabilization dual-task challenges  - turning and stepping in different directions over small obstacles - dynamic reaching with turning and stepping - lateral side stepping, backwards stepping in // bars *pt very fearful of falling with balance interventions*   Bryelle Spiewak Corinthia Dickinson, PT, DPT Physical Therapist - [facility]  [facility]  5:15 PM 08/06/23

## 2023-08-07 ENCOUNTER — Inpatient Hospital Stay: Payer: Medicare Other

## 2023-08-09 ENCOUNTER — Encounter: Payer: Medicare Other | Admitting: Speech Pathology

## 2023-08-09 ENCOUNTER — Ambulatory Visit: Payer: Medicare Other | Admitting: Physical Therapy

## 2023-08-09 DIAGNOSIS — R278 Other lack of coordination: Secondary | ICD-10-CM

## 2023-08-09 DIAGNOSIS — R42 Dizziness and giddiness: Secondary | ICD-10-CM

## 2023-08-09 DIAGNOSIS — R296 Repeated falls: Secondary | ICD-10-CM

## 2023-08-09 DIAGNOSIS — R262 Difficulty in walking, not elsewhere classified: Secondary | ICD-10-CM | POA: Diagnosis not present

## 2023-08-09 DIAGNOSIS — M6281 Muscle weakness (generalized): Secondary | ICD-10-CM

## 2023-08-09 NOTE — Therapy (Signed)
 OUTPATIENT PHYSICAL THERAPY NEURO TREATMENT   Patient Name: Brittany Benton MRN: [id number] DOB:1956-02-07, 68 y.o., female Today's Date: 08/09/2023   PCP: Lamon Pillow, MD REFERRING PROVIDER: Rosan Comfort, MD   END OF SESSION:    PT End of Session - 08/09/23 1022     Visit Number 17    Number of Visits 24    Date for PT Re-Evaluation 08/20/23    Authorization Type [employer] 2025 - auth 2/18-4/[id number] for 16 visits    PT Start Time 1021    PT Stop Time 1101    PT Time Calculation (min) 40 min    Equipment Utilized During Treatment Gait belt    Activity Tolerance Patient tolerated treatment well;Patient limited by fatigue    Behavior During Therapy [facility] for tasks assessed/performed                  Past Medical History:  Diagnosis Date   Allergy 1989   Anemia    Anxiety    Arthritis    osteoarthritis -right hip   Cataract    Depression    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   GERD (gastroesophageal reflux disease)    Hernia, incisional    after renal surgery   History of chicken pox    Hyperlipidemia    Hypertension    Renal cell carcinoma (HCC)    s/p right nephrectomy 1994   Sleep apnea    Thyroid  disease    Past Surgical History:  Procedure Laterality Date   APPENDECTOMY  1994   BIOPSY  11/07/2022   Procedure: BIOPSY;  Surgeon: Luke Salaam, MD;  Location: [facility] ENDOSCOPY;  Service: Gastroenterology;;   BREAST BIOPSY Right 1991   Negative   CHOLECYSTECTOMY  1994   COLONOSCOPY WITH PROPOFOL  N/A 11/07/2022   Procedure: COLONOSCOPY WITH PROPOFOL ;  Surgeon: Luke Salaam, MD;  Location: [facility] ENDOSCOPY;  Service: Gastroenterology;  Laterality: N/A;   ESOPHAGOGASTRODUODENOSCOPY (EGD) WITH PROPOFOL  N/A 11/07/2022   Procedure: ESOPHAGOGASTRODUODENOSCOPY (EGD) WITH PROPOFOL ;  Surgeon: Luke Salaam, MD;  Location: [facility] ENDOSCOPY;  Service: Gastroenterology;  Laterality: N/A;   JOINT REPLACEMENT  2019   LAPAROSCOPIC GASTRIC SLEEVE RESECTION   02/02/2016   Dr Ovid Blow at [facility]   NEPHRECTOMY  1994   Renal Cell Carcinoma   parotid gland removal  1990   also removed a tumor   POLYPECTOMY  11/07/2022   Procedure: POLYPECTOMY INTESTINAL;  Surgeon: Luke Salaam, MD;  Location: [facility] ENDOSCOPY;  Service: Gastroenterology;;   TONSILLECTOMY     TOTAL HIP ARTHROPLASTY Right 08/08/2016   Procedure: TOTAL HIP ARTHROPLASTY;  Surgeon: Marlynn Singer, MD;  Location: [facility] ORS;  Service: Orthopedics;  Laterality: Right;   TUBAL LIGATION  1979   Patient Active Problem List   Diagnosis Date Noted   B12 deficiency 06/11/2022   Iron deficiency anemia 04/10/2022   Morbid obesity (HCC) 03/30/2022   Vitamin A  deficiency 08/15/2021   Parkinson's disease (HCC) 08/03/2021   Acute urinary retention 08/03/2021   Meningioma (HCC) 04/05/2021   Hypothyroidism 08/15/2020   Osteopenia 06/14/2020   Status post total hip replacement, 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   Osteoarthritis of right hip 03/26/2016   H/O gastric bypass 03/26/2016   Sinus tachycardia 08/01/2015   Obesity (BMI 30-39.9) 06/08/2015   OSA (obstructive sleep apnea) 06/08/2015   GERD without esophagitis 06/08/2015   Incisional hernia 06/08/2015   Arthritis of hip 03/23/2015   Left knee pain 02/15/2015   Vitamin D  deficiency 09/29/2014   Lump of right breast  09/22/2014   Prediabetes 09/22/2014   Pruritic dermatitis 09/22/2014   Obstructive sleep apnea 09/22/2014   Allergic rhinitis 09/15/2009   Hypersomnia 09/14/2009   Panic disorder 08/06/2008   Essential (primary) hypertension 07/16/2007   Hyperlipidemia 07/16/2007    ONSET DATE: Approximately 2 years ago  REFERRING DIAG: R26.89 (ICD-10-CM) - Balance problem   THERAPY DIAG:  Difficulty in walking, not elsewhere classified  Muscle weakness (generalized)  Repeated falls  Other lack of coordination  Dizziness and giddiness  Rationale for Evaluation and Treatment: Rehabilitation  SUBJECTIVE:                                                                                                                                                                                              SUBJECTIVE STATEMENT:    Pt reports she has been doing "OK." Reports she has been walking around the house with her rollator. Denies pain. Pt reports the increased sinemet at night has been going well. Denies stumbles/falls since last session. However, reports when walking to the car using her rollator, she felt like her leg was going to give out, so Autry Legions had to assist her walking the final 3 steps to the car. Pt reports she can feel "a little more energy" and feels "a little more optimistic" after the B12 injection. Pt reports she didn't have anything to eat for breakfast today.   From last visit on 07/30/23: Pt reports after last therapy session she has had a significant decline in her ability to walk. Reports she only made it a short distance out of the therapy clinic and then had to sit and use her rollator to be transported to the car. States at home since then, she has only been able to walk limited distances using rollator with supervision from her husband; otherwise, she is mostly sitting. Reports she feels like her legs are going to just go out from under her. Pt reports the decline is not associated with anything performed at last therapy session, but rather was just ironic timing. Pt states she had this sort of mobility decline happen years ago and was hospitalized with the finding that she had low potassium. Denies any falls since this started. States she hasn't been to the MD regarding this.   States she has been taking the Parkinson's medication for ~1 month today.   Reports she previously had a headache a couple of days ago, but that has gone away. Pt denies low back pain nor any other pain at this time.  Reports husband found out today that he is not eligible for the cancer treatment they were hoping  for.  Later in session, husband reports pt  has not eaten anything all day and has overall had limited oral intake the past several days.  Pt accompanied by: self and family member (husband)  PERTINENT HISTORY: Pt is a 67y.o. female with concern for Parkinsonism due to resting tremors (R>L) with occasional whole-body jerks/myoclonus, bradykinesia, hallucinations while waking up, bilateral bradykinesia (L>R), minimal cogwheeling bilaterally, sensation of stiffness, speech changes, and imbalance (trialed Carbidopa-Levodopa, but intolerable due to side effects of severe nausea & vomiting). Pt with hx of frequent falls and bilateral LE weakness. Pt reports hx of kidney removal on R side with large abdominal incision and R hip replacement.  PAIN:  Are you having pain? No  PRECAUTIONS: Fall  RED FLAGS: None   WEIGHT BEARING RESTRICTIONS: No  FALLS: Has patient fallen in last 6 months? Yes. Number of falls 5-6 (2 in driveway, 1 on sidewalk)  Most recent fall last Thursday 05/23/2023, pt reports she became "disoriented" and had LOB, but no injuries- pt states in general she is "very easily distracted and then she will misstep causing her to have LOB"  LIVING ENVIRONMENT: Lives with: lives with their spouse Autry Legions) Lives in: House/apartment Stairs: Yes: Internal: reports she doesn't have to go to different floors in house (4 steps inside); and External: 3-4 steps to enter with R HRs + courtyard + additional 4-5 steps onto porch with B HRs -  entrance has steep driveway Has following equipment at home: Otho Blitz - 4 wheeled, shower bench, grabbars in bathroom, bedrail, and Ramped entry from garage (but has to go up steep driveway)  PLOF: Independent with basic ADLs, Independent with household mobility with device, Independent with household mobility without device, and Requires assistive device for independence Reports started using rollator more consistently ~2-3weeks ago but has used it on/off for a while  PATIENT GOALS: Wants to feel comfortable  moving around and be shown how to do that again  OBJECTIVE:  Note: Objective measures were completed at Evaluation unless otherwise noted.  DIAGNOSTIC FINDINGS:   CT Head without Contrast 04/03/2021: Chronic atrophic changes without acute abnormality.   MRI Brain without Contrast 04/04/2021: 1. No acute intracranial abnormality. 2. Mild age-related cerebral atrophy with chronic small vessel ischemic disease. 3. 1.1 cm meningioma overlying the left frontal convexity without associated mass effect or edema.   CT Cervical Spine without Contrast 04/03/2021: Moderate severity multilevel degenerative changes, most prominent at the levels of C4-C5, C5-C6 and C6-C7. No evidence of an acute fracture or subluxation.   COGNITION: Overall cognitive status: Within functional limits for tasks assessed   SENSATION: WFL Reassessed on 07/30/2023: WNL, denies numbness/tingling in LEs  COORDINATION: WFL for basic functional mobility tasks  EDEMA:  None observed  MUSCLE TONE: WNL  MUSCLE LENGTH: Not tested  DTRs:  Not tested  POSTURE: rounded shoulders, forward head, and increased lumbar lordosis  LOWER EXTREMITY ROM:     Passive  All WFL for basic functional mobility tasks Right Eval Left Eval  Hip flexion    Hip extension    Hip abduction    Hip adduction    Hip internal rotation    Hip external rotation    Knee flexion    Knee extension    Ankle dorsiflexion    Ankle plantarflexion    Ankle inversion    Ankle eversion     (Blank rows = not tested)  LOWER EXTREMITY MMT:    MMT Right Eval Left Eval 07/30/2023 R LE 07/30/2023 L LE  Hip flexion 3+ 4 3 3+  Hip extension      Hip abduction      Hip adduction      Hip internal rotation      Hip external rotation      Knee flexion 4- 4- 3+ 3+  Knee extension 4 4 4- 4-  Ankle dorsiflexion 4 4- 4 4-  Ankle plantarflexion 4- 4- 4- 4-  Ankle inversion      Ankle eversion      (Blank rows = not tested)  Manual Muscle Test  Scale 0/5 = No muscle contraction can be seen or felt 1/5 = Contraction can be felt, but there is no motion 2-/5 = Part moves through incomplete ROM w/ gravity decreased 2/5 = Part moves through complete ROM w/ gravity decreased 2+/5 = Part moves through incomplete ROM (<50%) against gravity or through complete ROM w/ gravity 3-/5 = Part moves through incomplete ROM (>50%) against gravity 3/5 = Part moves through complete ROM against gravity 3+/5 = Part moves through complete ROM against gravity/slight resistance 4-/5= Holds test position against slight to moderate pressure 4/5 = Part moves through complete ROM against gravity/moderate resistance 4+/5= Holds test position against moderate to strong pressure 5/5 = Part moves through complete ROM against gravity/full resistance  BED MOBILITY:  Sit to supine Min A and requires used of bedrail Supine to sit Min A and requires used of bedrail Rolling to Right Min A Rolling to Left Min A Relies on bedrial, difficulty with lateral scooting in the bed (or in sitting)  TRANSFERS: Assistive device utilized: Environmental consultant - 4 wheeled  Sit to stand: SBA and CGA Stand to sit: SBA and CGA Chair to chair: SBA and CGA Without AD requires more consistent CGA/light min A Floor:  not assessed  RAMP:  Assistive device utilized: Environmental consultant - 4 wheeled Ramp Comments: would benefit from assessment of this when able due to steep driveway at home  CURB:   Curb Comments: Not assessed  STAIRS: Level of Assistance: CGA Stair Negotiation Technique: Step to Pattern with Bilateral Rails ascending with R LE and descending with L LE Number of Stairs: 4  Height of Stairs: 6in  Comments: Pt reports feeling more confident when having B HR support  GAIT: Gait pattern: step through pattern, decreased step length- Right, decreased step length- Left, decreased stride length, shuffling, trendelenburg, lateral hip instability, lateral lean- Right, lateral lean- Left, wide  BOS, poor foot clearance- Right, and poor foot clearance- Left --- R/L lateral trunk lean over stance limb, decreased step lengths bilaterally, partial shuffled gait, wide BOS, increased balance instability Distance walked: ~61ft using HHA progressed to no UE support Assistive device utilized:  HHA progressed to no UE support Level of assistance: CGA and Min A Comments: Slow gait speed  FUNCTIONAL TESTS:  5 times sit to stand: on 06/04/2023: 59.25 seconds Timed up and go (TUG): 24.90sec without AD, 22.25 with rollator 3 minute walk test: on 06/04/2023: 238ft using rollator = 80.2meters 10 meter walk test: 36.75 seconds = 0.29m/s without AD; 06/04/23 0.61m/s average gait speed during 3 minute walk test using rollator  Berg Balance Scale: on 06/04/2023 = 31/56 Would benefit from Modified CTSIB & FGA  PATIENT SURVEYS:  ABC scale 17.5%  Details: patient 0% confident with majority of items; 50% confident household level ambulation, 40% stairs, 50% sweeping floor, 50% getting in/out of car, and 90% confident ambulating up/down ramp with rails    Vestibular Assessment  - performed on 06/07/2023 -  re-assessed on 2/25 with details in that treatment note  OCULOMOTOR EXAM:  Ocular Alignment: normal  Ocular ROM: No Limitations  Spontaneous Nystagmus: absent  Gaze-Induced Nystagmus: absent and continues to have difficulty keeping eyes on target, especially towards L  Smooth Pursuits:  difficulty keeping eyes on target  Saccades: hypometric/undershoots, extra eye movements, and difficulty finding target on L side  Convergence/Divergence: not assessed Cover/Cross Cover: WNL  VESTIBULAR - OCULAR REFLEX:   VOR Cancellation: Unable to Maintain Gaze, inconsistent ability to keep eyes on target  Head-Impulse Test: HIT Right: negative HIT Left: negative -- had a difficult time keeping eyes on target in both directions  Dynamic Visual Acuity:  would benefit from future testing    POSITIONAL TESTING: Right  Dix-Hallpike: upbeating, right nystagmus Left Dix-Hallpike: benefit from testing at next visit Right Roll Test: no nystagmus Left Roll Test: no nystagmus Left Sidelying: no nystagmus  MOTION SENSITIVITY:  May benefit from testing in future visits                                                                                                   TREATMENT DATE:     08/09/23   Pt again arrives to therapy session in transport chair.   Sit<>stands using rollator with CGA/close SBA for safety throughout session. Pt does well managing AD brake with all transfers.   Gait training ~33ft x2 to/from Nustep using rollator with CGA for safety, but no instability noted, and pt achieving reciprocal stepping pattern although slow gait speed - pt demos decreased back and forth movement of AD today.  B UE and B LE reciprocal movement pattern retraining on Nustep for strength and endurance training against level 2 resistance for 2 min, increased to level 3 for 4 minutes totaling 6 minutes and 225 steps - educated pt to maintain SPM (steps per minute) >45 with pt sustaining it ~50% of the time due to becoming distracted.   Gait training ~116ft + ~6ft (seated break between) using rollator with CGA for safety - continues to achieve reciprocal stepping pattern although slow gait speed - does have slight R/L rollator movement with each step due to pt using it for balance stability, but not causing patient to become unstable as this is her typical gait using rollator. Focusing on increasing gait endurance as well as increasing patient's confidence with ambulation; however, pt with overall increased fatigue today.  Pt reporting overall increased fatigue today and appears to be more emotionally down today.   During rest breaks, therapist provided education to patient on potential benefits of seeing a nutritional psychologist to support her in being educated about food and help with improving her desire to eat in  order to provide her body the proper nutrients she needs for energy to participate in her daily activities. Therapist quickly looked up a local psychologist and provided patient with printout of their information for patient to look more into.  Pt reports she knows the importance of continued physical activity for her Parkinson's diagnosis. Therapist educated her on the [employer] program, if she is eligible through her insurance,  and the option to use it at the hospital [employer] or local [employer]. Educated pt that both of those fitness centers have a Nustep.   Pt appreciative of the information. Pt continues to report feeling fatigue today.   PATIENT EDUCATION:  Education details: PT POC, balance impairments and increased fall risk based on outcome measures, HEP Person educated: Patient and Spouse Education method: Explanation Education comprehension: verbalized understanding and needs further education  HOME EXERCISE PROGRAM:  Access Code: [email or web address] URL: [email or web address] Date: 07/23/2023 Prepared by: Carlen Chasten  Exercises - Walking  - 1 x daily - 7 x weekly - 3-5 sets - 3 minutes hold - Sit to Stand with Hands on Knees  - 1 x daily - 7 x weekly - 2 sets - 10 reps - Standing March with Counter Support  - 1 x daily - 7 x weekly - 2 sets - 10 reps - Supine Bridge  - 1 x daily - 7 x weekly - 2 sets - 10 reps    GOALS: Goals reviewed with patient? Yes  SHORT TERM GOALS: Target date: 07/09/2023   Patient will be independent in home exercise program to improve strength/mobility for better functional independence with ADLs. Baseline: provided on 06/04/2023 07/09/23: pt reports performing, will need to progress Goal status: IN PROGRESS  2. Patient will improve ABC scale by 20% indicating increased confidence performing daily mobility tasks, which is associated with fall risk.    Baseline: 05/28/2023 = 17.5%;   07/09/23: need to re-assess; 3/28: 45.6%   Goal Status:  MET   LONG TERM GOALS: Target date: 08/20/2023   Patient (> 84 years old) will complete five times sit to stand test in < 15 seconds indicating an increased LE strength and improved balance. Baseline: 06/04/2023: 59.25 seconds 07/09/23: 20.27 seconds using UE support on armrests 3/28: unable to complete 5 sit to stands without UE support - stopped at 3 @  34.9 seconds  Goal status: IN PROGRESS  Patient will increase Berg Balance score by > 6 points to demonstrate decreased fall risk during functional activities. Baseline: 06/04/2023: 31/56 07/09/23: 38/56 Goal Updated: Will increase Berg Balance score to >45/56 Goal status: MET and Revised on 07/09/2023   Patient will reduce timed up and go to <11 seconds to reduce fall risk and demonstrate improved transfer/gait ability. Baseline: 05/28/2023 = 22.25sec using rollator vs 24.90 seconds without AD 07/09/23: 18.35 seconds using rollator and 19.07 sec without AD Goal status: IN PROGRESS  Patient will increase 10 meter walk test to >0.52m/s as to improve gait speed for better community ambulation and to reduce fall risk. Baseline: 05/28/2023 = 0.53m/s 07/09/23: 0.59 m/s (16.90 seconds) using rollator & 0.43 m/s (22.89 seconds) without AD Goal status: IN PROGRESS  Patient will increase 3 minute walk test distance to >337ft for progression to limited community ambulation and improved gait ability Baseline: 06/04/2023 = 297ft (80.53meters) using rollator 07/09/23: need to re-assess 3/28: 290' using rollator  Goal status: IN PROGRESS   ASSESSMENT:  CLINICAL IMPRESSION:   Patient arrived with overall increased fatigue and appearing to have decreased mood today.  Patient participated in B UE and B LE functional strengthening on Nustep as well as gait training using rollator for increased confidence and endurance with ambulation. Therapy session focused on providing patient with education on recommendation to look into a nutritional psychologist to  support her in her efforts to increase her daily nutritional intake because pt continues to report minimal foot intake. Patient also educated on options  to eventually transition to [employer] or local [employer] to promote continued physical activity for overall health and wellness given her Parkinson's diagnosis. Ms. Baumert will benefit from further skilled PT to improve these deficits in order to increase QOL, decrease fal risk, and ease/safety with ADLs.   OBJECTIVE IMPAIRMENTS: Abnormal gait, cardiopulmonary status limiting activity, decreased activity tolerance, decreased balance, decreased endurance, decreased mobility, difficulty walking, and decreased strength.   ACTIVITY LIMITATIONS: carrying, lifting, bending, standing, squatting, stairs, transfers, bed mobility, reach over head, and locomotion level  PARTICIPATION LIMITATIONS: meal prep, cleaning, laundry, shopping, and community activity  PERSONAL FACTORS: Age, Behavior pattern, Fitness, Time since onset of injury/illness/exacerbation, and 3+ comorbidities: Parkinsonism, anxiety, arthritis, depression, HTN  are also affecting patient's functional outcome.   REHAB POTENTIAL: Good  CLINICAL DECISION MAKING: Evolving/moderate complexity  EVALUATION COMPLEXITY: Moderate  PLAN:  PT FREQUENCY: 1-2x/week  PT DURATION: 12 weeks  PLANNED INTERVENTIONS: 97164- PT Re-evaluation, 97110-Therapeutic exercises, 97530- Therapeutic activity, 97112- Neuromuscular re-education, 97535- Self Care, 16109- Manual therapy, [phone]- Gait training, [phone]- Canalith repositioning, Patient/Family education, Balance training, Stair training, Vestibular training, Visual/preceptual remediation/compensation, Cognitive remediation, and DME instructions   PLAN FOR NEXT SESSION:  - Nustep - progressive increase in activity following recent, sudden decline in mobility on 07/26/2023 - follow-up on recommendation to look into a nutritional psychologist - follow-up on vestibular  treatment from 06/28/2023 if needed - B LE functional strengthening   - adding weights/resistance training as appropriate   - continue bridges - dynamic balance and gait challenges with gaze stabilization dual-task challenges  - turning and stepping in different directions over small obstacles - dynamic reaching with turning and stepping - lateral side stepping, backwards stepping in // bars *pt very fearful of falling with balance interventions*   Tiler Brandis Corinthia Dickinson, PT, DPT Physical Therapist - [facility]  11:15 AM 08/09/23

## 2023-08-10 ENCOUNTER — Other Ambulatory Visit: Payer: Self-pay | Admitting: Family Medicine

## 2023-08-10 DIAGNOSIS — E039 Hypothyroidism, unspecified: Secondary | ICD-10-CM

## 2023-08-13 ENCOUNTER — Ambulatory Visit: Payer: Medicare Other | Admitting: Physical Therapy

## 2023-08-13 ENCOUNTER — Encounter: Payer: Medicare Other | Admitting: Speech Pathology

## 2023-08-13 DIAGNOSIS — R262 Difficulty in walking, not elsewhere classified: Secondary | ICD-10-CM | POA: Diagnosis not present

## 2023-08-13 DIAGNOSIS — M6281 Muscle weakness (generalized): Secondary | ICD-10-CM

## 2023-08-13 DIAGNOSIS — R42 Dizziness and giddiness: Secondary | ICD-10-CM | POA: Diagnosis not present

## 2023-08-13 DIAGNOSIS — R296 Repeated falls: Secondary | ICD-10-CM

## 2023-08-13 DIAGNOSIS — R278 Other lack of coordination: Secondary | ICD-10-CM | POA: Diagnosis not present

## 2023-08-13 NOTE — Therapy (Signed)
 OUTPATIENT PHYSICAL THERAPY NEURO TREATMENT   Patient Name: Brittany Benton MRN: [id number] DOB:October 14, 1955, 68 y.o., female Today's Date: 08/13/2023   PCP: Lamon Pillow, MD REFERRING PROVIDER: Rosan Comfort, MD   END OF SESSION:    PT End of Session - 08/13/23 1621     Visit Number 18    Number of Visits 24    Date for PT Re-Evaluation 08/20/23    Authorization Type [employer] 2025 - auth 2/18-4/15 for 16 visits    PT Start Time 1621    PT Stop Time 1701    PT Time Calculation (min) 40 min    Equipment Utilized During Treatment Gait belt    Activity Tolerance Patient limited by fatigue    Behavior During Therapy 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   GERD (gastroesophageal reflux disease)    Hernia, incisional    after renal surgery   History of chicken pox    Hyperlipidemia    Hypertension    Renal cell carcinoma (HCC)    s/p right nephrectomy 1994   Sleep apnea    Thyroid  disease    Past Surgical History:  Procedure Laterality Date   APPENDECTOMY  1994   BIOPSY  11/07/2022   Procedure: BIOPSY;  Surgeon: Luke Salaam, MD;  Location: [facility] ENDOSCOPY;  Service: Gastroenterology;;   BREAST BIOPSY Right 1991   Negative   CHOLECYSTECTOMY  1994   COLONOSCOPY WITH PROPOFOL  N/A 11/07/2022   Procedure: COLONOSCOPY WITH PROPOFOL ;  Surgeon: Luke Salaam, MD;  Location: [facility] ENDOSCOPY;  Service: Gastroenterology;  Laterality: N/A;   ESOPHAGOGASTRODUODENOSCOPY (EGD) WITH PROPOFOL  N/A 11/07/2022   Procedure: ESOPHAGOGASTRODUODENOSCOPY (EGD) WITH PROPOFOL ;  Surgeon: Luke Salaam, MD;  Location: [facility] ENDOSCOPY;  Service: Gastroenterology;  Laterality: N/A;   JOINT REPLACEMENT  2019   LAPAROSCOPIC GASTRIC SLEEVE RESECTION  02/02/2016   Dr Ovid Blow at [facility]    NEPHRECTOMY  1994   Renal Cell Carcinoma   parotid gland removal  1990   also removed a tumor   POLYPECTOMY  11/07/2022   Procedure: POLYPECTOMY INTESTINAL;  Surgeon: Luke Salaam, MD;  Location: [facility] ENDOSCOPY;  Service: Gastroenterology;;   TONSILLECTOMY     TOTAL HIP ARTHROPLASTY Right 08/08/2016   Procedure: TOTAL HIP ARTHROPLASTY;  Surgeon: Marlynn Singer, MD;  Location: [facility] ORS;  Service: Orthopedics;  Laterality: Right;   TUBAL LIGATION  1979   Patient Active Problem List   Diagnosis Date Noted   B12 deficiency 06/11/2022   Iron deficiency anemia 04/10/2022   Morbid obesity (HCC) 03/30/2022   Vitamin A  deficiency 08/15/2021   Parkinson's disease (HCC) 08/03/2021   Acute urinary retention 08/03/2021   Meningioma (HCC) 04/05/2021   Hypothyroidism 08/15/2020   Osteopenia 06/14/2020   Status post total hip replacement, 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   Osteoarthritis of right hip 03/26/2016   H/O gastric bypass 03/26/2016   Sinus tachycardia 08/01/2015   Obesity (BMI 30-39.9) 06/08/2015   OSA (obstructive sleep apnea) 06/08/2015   GERD without esophagitis 06/08/2015   Incisional hernia 06/08/2015   Arthritis of hip 03/23/2015   Left knee pain 02/15/2015   Vitamin D  deficiency 09/29/2014   Lump of right breast 09/22/2014   Prediabetes 09/22/2014   Pruritic  dermatitis 09/22/2014   Obstructive sleep apnea 09/22/2014   Allergic rhinitis 09/15/2009   Hypersomnia 09/14/2009   Panic disorder 08/06/2008   Essential (primary) hypertension 07/16/2007   Hyperlipidemia 07/16/2007    ONSET DATE: Approximately 2 years ago  REFERRING DIAG: R26.89 (ICD-10-CM) - Balance problem   THERAPY DIAG:  Difficulty in walking, not elsewhere classified  Muscle weakness (generalized)  Repeated falls  Other lack of coordination  Dizziness and giddiness  Rationale for Evaluation and Treatment: Rehabilitation  SUBJECTIVE:                                                                                                                                                                                              SUBJECTIVE STATEMENT:    Pt reports she had a fall last night after turning the light off then going to walk 10 steps to the bed while trying to hold onto the doorframe and reaching for the bed (although she had her rollator nearby), but she didn't feel safe so she "froze" and knew she was going to go down, so she just went on down. States she hit her head when she went down, but doesn't feel she has any injuries other than bruising to her R knee and hip. Reports she started yelling for her husband, who didn't hear her for 3 hours. Pt states she is planning to get the life alert today.   Pt reports she had some "difficulty" in the shower today.  States she just has "no energy" to walk. Pt states she cannot "raise" herself up from a chair. Pt reports her voice has gotten "jittery."   Pt reports today she ate a small piece of spanakopita (small greek food item) and salad. Pt states she is "still not eating" because the food tastes like dirt. Pt reports she has not yet tried calling the nutritional psychologist this therapist looked up for her during last visit, but she plans to.    From visit on 07/30/23: Pt reports after last therapy session she has had a significant decline in her ability to walk. Reports she only made it a short distance out of the therapy clinic and then had to sit and use her rollator to be transported to the car. States at home since then, she has only been able to walk limited distances using rollator with supervision from her husband; otherwise, she is mostly sitting. Reports she feels like her legs are going to just go out from under her. Pt reports the decline is not associated with anything performed at last therapy session, but rather was just ironic timing. Pt states she  had this sort of mobility decline happen years ago and was hospitalized with the finding that she had  low potassium. Denies any falls since this started. States she hasn't been to the MD regarding this.   States she has been taking the Parkinson's medication for ~1 month today.   Reports she previously had a headache a couple of days ago, but that has gone away. Pt denies low back pain nor any other pain at this time.  Reports husband found out today that he is not eligible for the cancer treatment they were hoping for.  Later in session, husband reports pt has not eaten anything all day and has overall had limited oral intake the past several days.  Pt accompanied by: self and family member (husband)  PERTINENT HISTORY: Pt is a 67y.o. female with concern for Parkinsonism due to resting tremors (R>L) with occasional whole-body jerks/myoclonus, bradykinesia, hallucinations while waking up, bilateral bradykinesia (L>R), minimal cogwheeling bilaterally, sensation of stiffness, speech changes, and imbalance (trialed Carbidopa-Levodopa, but intolerable due to side effects of severe nausea & vomiting). Pt with hx of frequent falls and bilateral LE weakness. Pt reports hx of kidney removal on R side with large abdominal incision and R hip replacement.  PAIN:  Are you having pain? No  PRECAUTIONS: Fall  RED FLAGS: None   WEIGHT BEARING RESTRICTIONS: No  FALLS: Has patient fallen in last 6 months? Yes. Number of falls 5-6 (2 in driveway, 1 on sidewalk)  Most recent fall last Thursday 05/23/2023, pt reports she became "disoriented" and had LOB, but no injuries- pt states in general she is "very easily distracted and then she will misstep causing her to have LOB"  LIVING ENVIRONMENT: Lives with: lives with their spouse Autry Legions) Lives in: House/apartment Stairs: Yes: Internal: reports she doesn't have to go to different floors in house (4 steps inside); and External: 3-4 steps to enter with R HRs + courtyard + additional 4-5 steps onto porch with B HRs -  entrance has steep driveway Has following  equipment at home: Otho Blitz - 4 wheeled, shower bench, grabbars in bathroom, bedrail, and Ramped entry from garage (but has to go up steep driveway)  PLOF: Independent with basic ADLs, Independent with household mobility with device, Independent with household mobility without device, and Requires assistive device for independence Reports started using rollator more consistently ~2-3weeks ago but has used it on/off for a while  PATIENT GOALS: Wants to feel comfortable moving around and be shown how to do that again  OBJECTIVE:  Note: Objective measures were completed at Evaluation unless otherwise noted.  DIAGNOSTIC FINDINGS:   CT Head without Contrast 04/03/2021: Chronic atrophic changes without acute abnormality.   MRI Brain without Contrast 04/04/2021: 1. No acute intracranial abnormality. 2. Mild age-related cerebral atrophy with chronic small vessel ischemic disease. 3. 1.1 cm meningioma overlying the left frontal convexity without associated mass effect or edema.   CT Cervical Spine without Contrast 04/03/2021: Moderate severity multilevel degenerative changes, most prominent at the levels of C4-C5, C5-C6 and C6-C7. No evidence of an acute fracture or subluxation.   COGNITION: Overall cognitive status: Within functional limits for tasks assessed   SENSATION: WFL Reassessed on 07/30/2023: WNL, denies numbness/tingling in LEs  COORDINATION: WFL for basic functional mobility tasks  EDEMA:  None observed  MUSCLE TONE: WNL  MUSCLE LENGTH: Not tested  DTRs:  Not tested  POSTURE: rounded shoulders, forward head, and increased lumbar lordosis  LOWER EXTREMITY ROM:     Passive  All  WFL for basic functional mobility tasks Right Eval Left Eval  Hip flexion    Hip extension    Hip abduction    Hip adduction    Hip internal rotation    Hip external rotation    Knee flexion    Knee extension    Ankle dorsiflexion    Ankle plantarflexion    Ankle inversion    Ankle  eversion     (Blank rows = not tested)  LOWER EXTREMITY MMT:    MMT Right Eval Left Eval 07/30/2023 R LE 07/30/2023 L LE  Hip flexion 3+ 4 3 3+  Hip extension      Hip abduction      Hip adduction      Hip internal rotation      Hip external rotation      Knee flexion 4- 4- 3+ 3+  Knee extension 4 4 4- 4-  Ankle dorsiflexion 4 4- 4 4-  Ankle plantarflexion 4- 4- 4- 4-  Ankle inversion      Ankle eversion      (Blank rows = not tested)  Manual Muscle Test Scale 0/5 = No muscle contraction can be seen or felt 1/5 = Contraction can be felt, but there is no motion 2-/5 = Part moves through incomplete ROM w/ gravity decreased 2/5 = Part moves through complete ROM w/ gravity decreased 2+/5 = Part moves through incomplete ROM (<50%) against gravity or through complete ROM w/ gravity 3-/5 = Part moves through incomplete ROM (>50%) against gravity 3/5 = Part moves through complete ROM against gravity 3+/5 = Part moves through complete ROM against gravity/slight resistance 4-/5= Holds test position against slight to moderate pressure 4/5 = Part moves through complete ROM against gravity/moderate resistance 4+/5= Holds test position against moderate to strong pressure 5/5 = Part moves through complete ROM against gravity/full resistance  BED MOBILITY:  Sit to supine Min A and requires used of bedrail Supine to sit Min A and requires used of bedrail Rolling to Right Min A Rolling to Left Min A Relies on bedrial, difficulty with lateral scooting in the bed (or in sitting)  TRANSFERS: Assistive device utilized: Environmental consultant - 4 wheeled  Sit to stand: SBA and CGA Stand to sit: SBA and CGA Chair to chair: SBA and CGA Without AD requires more consistent CGA/light min A Floor:  not assessed  RAMP:  Assistive device utilized: Environmental consultant - 4 wheeled Ramp Comments: would benefit from assessment of this when able due to steep driveway at home  CURB:   Curb Comments: Not  assessed  STAIRS: Level of Assistance: CGA Stair Negotiation Technique: Step to Pattern with Bilateral Rails ascending with R LE and descending with L LE Number of Stairs: 4  Height of Stairs: 6in  Comments: Pt reports feeling more confident when having B HR support  GAIT: Gait pattern: step through pattern, decreased step length- Right, decreased step length- Left, decreased stride length, shuffling, trendelenburg, lateral hip instability, lateral lean- Right, lateral lean- Left, wide BOS, poor foot clearance- Right, and poor foot clearance- Left --- R/L lateral trunk lean over stance limb, decreased step lengths bilaterally, partial shuffled gait, wide BOS, increased balance instability Distance walked: ~46ft using HHA progressed to no UE support Assistive device utilized:  HHA progressed to no UE support Level of assistance: CGA and Min A Comments: Slow gait speed  FUNCTIONAL TESTS:  5 times sit to stand: on 06/04/2023: 59.25 seconds Timed up and go (TUG): 24.90sec without AD, 22.25 with  rollator 3 minute walk test: on 06/04/2023: 225ft using rollator = 80.12meters 10 meter walk test: 36.75 seconds = 0.73m/s without AD; 06/04/23 0.75m/s average gait speed during 3 minute walk test using rollator  Berg Balance Scale: on 06/04/2023 = 31/56 Would benefit from Modified CTSIB & FGA  PATIENT SURVEYS:  ABC scale 17.5%  Details: patient 0% confident with majority of items; 50% confident household level ambulation, 40% stairs, 50% sweeping floor, 50% getting in/out of car, and 90% confident ambulating up/down ramp with rails    Vestibular Assessment  - performed on 06/07/2023 - re-assessed on 2/25 with details in that treatment note  OCULOMOTOR EXAM:  Ocular Alignment: normal  Ocular ROM: No Limitations  Spontaneous Nystagmus: absent  Gaze-Induced Nystagmus: absent and continues to have difficulty keeping eyes on target, especially towards L  Smooth Pursuits:  difficulty keeping eyes on  target  Saccades: hypometric/undershoots, extra eye movements, and difficulty finding target on L side  Convergence/Divergence: not assessed Cover/Cross Cover: WNL  VESTIBULAR - OCULAR REFLEX:   VOR Cancellation: Unable to Maintain Gaze, inconsistent ability to keep eyes on target  Head-Impulse Test: HIT Right: negative HIT Left: negative -- had a difficult time keeping eyes on target in both directions  Dynamic Visual Acuity:  would benefit from future testing    POSITIONAL TESTING: Right Dix-Hallpike: upbeating, right nystagmus Left Dix-Hallpike: benefit from testing at next visit Right Roll Test: no nystagmus Left Roll Test: no nystagmus Left Sidelying: no nystagmus  MOTION SENSITIVITY:  May benefit from testing in future visits                                                                                                   TREATMENT DATE:     08/13/23   Pt again arrives to therapy session in transport chair.   R stand pivot transport chair>Nustep, no AD, with CGA/light min A for safety/steadying.  B UE and B LE reciprocal movement pattern retraining on Nustep for strength and endurance training against level 1 resistance for 1 min, level 2 for 2 minutes and level 3 for 3 minutes totaling 6 minutes and 198 steps - encouraged pt to maintain SPM (steps per minute) >40 with pt sustaining it ~50% of the time due to overall increased fatigue today. HR 110bpm with this activity   Sit>stand using rollator with light min A to come to stand from green chair with no armrests - pt relying on pushing up through B hands to come to stand. Pt does well managing AD brake with all transfers.   Gait training 7ft + ~46ft (seated break between) using rollator with CGA for safety - continues to achieve reciprocal stepping pattern although slow gait speed - does have slight R/L rollator movement with each step due to pt using it for balance stability, but not causing patient to become unstable as  this is her typical gait using rollator. Focusing on increasing gait endurance as well as increasing patient's confidence with ambulation; however, pt with overall increased fatigue today. HR 109bpm after 1st walk (pt reports her "normal" HR is usually in  90s)  Pt continues to report overall increased fatigue today.  Therapist continues to provide gentle education to pt on importance of ensuring she receives adequate nutritional intake to support her in having the energy to participate in necessary daily functional mobility and ADLs.  Therapist requested pt schedule a follow-up visit with her PCP due to concerns of pt's lack of nutritional intake with associated decline in functional mobility and energy levels.   PATIENT EDUCATION:  Education details: PT POC, balance impairments and increased fall risk based on outcome measures, HEP Person educated: Patient and Spouse Education method: Explanation Education comprehension: verbalized understanding and needs further education  HOME EXERCISE PROGRAM:  Access Code: [email or web address] URL: [email or web address] Date: 07/23/2023 Prepared by: Carlen Chasten  Exercises - Walking  - 1 x daily - 7 x weekly - 3-5 sets - 3 minutes hold - Sit to Stand with Hands on Knees  - 1 x daily - 7 x weekly - 2 sets - 10 reps - Standing March with Counter Support  - 1 x daily - 7 x weekly - 2 sets - 10 reps - Supine Bridge  - 1 x daily - 7 x weekly - 2 sets - 10 reps    GOALS: Goals reviewed with patient? Yes  SHORT TERM GOALS: Target date: 07/09/2023   Patient will be independent in home exercise program to improve strength/mobility for better functional independence with ADLs. Baseline: provided on 06/04/2023 07/09/23: pt reports performing, will need to progress Goal status: IN PROGRESS  2. Patient will improve ABC scale by 20% indicating increased confidence performing daily mobility tasks, which is associated with fall risk.    Baseline:  05/28/2023 = 17.5%;   07/09/23: need to re-assess; 3/28: 45.6%   Goal Status: MET   LONG TERM GOALS: Target date: 08/20/2023   Patient (> 58 years old) will complete five times sit to stand test in < 15 seconds indicating an increased LE strength and improved balance. Baseline: 06/04/2023: 59.25 seconds 07/09/23: 20.27 seconds using UE support on armrests 3/28: unable to complete 5 sit to stands without UE support - stopped at 3 @  34.9 seconds  Goal status: IN PROGRESS  Patient will increase Berg Balance score by > 6 points to demonstrate decreased fall risk during functional activities. Baseline: 06/04/2023: 31/56 07/09/23: 38/56 Goal Updated: Will increase Berg Balance score to >45/56 Goal status: MET and Revised on 07/09/2023   Patient will reduce timed up and go to <11 seconds to reduce fall risk and demonstrate improved transfer/gait ability. Baseline: 05/28/2023 = 22.25sec using rollator vs 24.90 seconds without AD 07/09/23: 18.35 seconds using rollator and 19.07 sec without AD Goal status: IN PROGRESS  Patient will increase 10 meter walk test to >0.83m/s as to improve gait speed for better community ambulation and to reduce fall risk. Baseline: 05/28/2023 = 0.96m/s 07/09/23: 0.59 m/s (16.90 seconds) using rollator & 0.43 m/s (22.89 seconds) without AD Goal status: IN PROGRESS  Patient will increase 3 minute walk test distance to >374ft for progression to limited community ambulation and improved gait ability Baseline: 06/04/2023 = 265ft (80.50meters) using rollator 07/09/23: need to re-assess 3/28: 290' using rollator  Goal status: IN PROGRESS   ASSESSMENT:  CLINICAL IMPRESSION:   Patient again arrived with overall increased fatigue and reports of continued minimal nutritional intake each day. Patient participated in B UE and B LE functional strengthening on Nustep as well as gait training using rollator for increased confidence and endurance with ambulation; however, pt with decrease  in number of steps on Nustep as well as decreased gait distance today due to fatigue. Thearpist again provided pt with education on importance of nutritional intake to ensure she has the proper nutrients to fuel her body to complete daily activities. Therapist recommended patient schedule a follow-up visit with her PCP to discuss this change and associated functional decline. Ms. Orlosky will benefit from further skilled PT to improve these deficits in order to increase QOL, decrease fal risk, and ease/safety with ADLs.   OBJECTIVE IMPAIRMENTS: Abnormal gait, cardiopulmonary status limiting activity, decreased activity tolerance, decreased balance, decreased endurance, decreased mobility, difficulty walking, and decreased strength.   ACTIVITY LIMITATIONS: carrying, lifting, bending, standing, squatting, stairs, transfers, bed mobility, reach over head, and locomotion level  PARTICIPATION LIMITATIONS: meal prep, cleaning, laundry, shopping, and community activity  PERSONAL FACTORS: Age, Behavior pattern, Fitness, Time since onset of injury/illness/exacerbation, and 3+ comorbidities: Parkinsonism, anxiety, arthritis, depression, HTN  are also affecting patient's functional outcome.   REHAB POTENTIAL: Good  CLINICAL DECISION MAKING: Evolving/moderate complexity  EVALUATION COMPLEXITY: Moderate  PLAN:  PT FREQUENCY: 1-2x/week  PT DURATION: 12 weeks  PLANNED INTERVENTIONS: [id number]- PT Re-evaluation, 97110-Therapeutic exercises, 97530- Therapeutic activity, 97112- Neuromuscular re-education, 97535- Self Care, [id number]- Manual therapy, [phone]- Gait training, [phone]- Canalith repositioning, Patient/Family education, Balance training, Stair training, Vestibular training, Visual/preceptual remediation/compensation, Cognitive remediation, and DME instructions   PLAN FOR NEXT SESSION:  *follow-up on pt scheduling apt with PCP regarding minimal nutritional intake - follow-up on recommendation to look into a  nutritional psychologist - follow-up on getting life alert system - Nustep - progressive increase in activity following recent, sudden decline in mobility on 07/26/2023 - follow-up on vestibular treatment from 06/28/2023 if needed - B LE functional strengthening   - adding weights/resistance training as appropriate   - continue bridges - dynamic balance and gait challenges with gaze stabilization dual-task challenges  - turning and stepping in different directions over small obstacles - dynamic reaching with turning and stepping - lateral side stepping, backwards stepping in // bars *pt very fearful of falling with balance interventions*   Mikayela Deats Corinthia Dickinson, PT, DPT Physical Therapist - [facility]  [facility]  5:23 PM 08/13/23

## 2023-08-16 ENCOUNTER — Encounter: Payer: Medicare Other | Admitting: Speech Pathology

## 2023-08-16 ENCOUNTER — Ambulatory Visit: Payer: Medicare Other | Attending: Neurology | Admitting: Physical Therapy

## 2023-08-16 DIAGNOSIS — R296 Repeated falls: Secondary | ICD-10-CM | POA: Insufficient documentation

## 2023-08-16 DIAGNOSIS — R278 Other lack of coordination: Secondary | ICD-10-CM | POA: Diagnosis not present

## 2023-08-16 DIAGNOSIS — R42 Dizziness and giddiness: Secondary | ICD-10-CM | POA: Insufficient documentation

## 2023-08-16 DIAGNOSIS — R262 Difficulty in walking, not elsewhere classified: Secondary | ICD-10-CM | POA: Diagnosis not present

## 2023-08-16 DIAGNOSIS — R41841 Cognitive communication deficit: Secondary | ICD-10-CM | POA: Insufficient documentation

## 2023-08-16 DIAGNOSIS — M6281 Muscle weakness (generalized): Secondary | ICD-10-CM | POA: Diagnosis not present

## 2023-08-16 NOTE — Therapy (Signed)
 OUTPATIENT PHYSICAL THERAPY NEURO TREATMENT   Patient Name: Brittany Benton MRN: [id number] DOB:1955/08/21, 68 y.o., female Today's Date: 08/16/2023   PCP: Lamon Pillow, MD REFERRING PROVIDER: Rosan Comfort, MD   END OF SESSION:    PT End of Session - 08/16/23 1023     Visit Number 19    Number of Visits 24    Date for PT Re-Evaluation 08/20/23    Authorization Type [employer] 2025 - auth 2/[id number]/[id number] for 16 visits    PT Start Time 1021    PT Stop Time 1100    PT Time Calculation (min) 39 min    Equipment Utilized During Treatment Gait belt    Activity Tolerance Patient tolerated treatment well    Behavior During Therapy WFL for tasks assessed/performed              Past Medical History:  Diagnosis Date   Allergy 1989   Anemia    Anxiety    Arthritis    osteoarthritis -right hip   Cataract    Depression    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   GERD (gastroesophageal reflux disease)    Hernia, incisional    after renal surgery   History of chicken pox    Hyperlipidemia    Hypertension    Renal cell carcinoma (HCC)    s/p right nephrectomy 1994   Sleep apnea    Thyroid  disease    Past Surgical History:  Procedure Laterality Date   APPENDECTOMY  1994   BIOPSY  11/07/2022   Procedure: BIOPSY;  Surgeon: Luke Salaam, MD;  Location: [facility] ENDOSCOPY;  Service: Gastroenterology;;   BREAST BIOPSY Right 1991   Negative   CHOLECYSTECTOMY  1994   COLONOSCOPY WITH PROPOFOL  N/A 11/07/2022   Procedure: COLONOSCOPY WITH PROPOFOL ;  Surgeon: Luke Salaam, MD;  Location: [facility] ENDOSCOPY;  Service: Gastroenterology;  Laterality: N/A;   ESOPHAGOGASTRODUODENOSCOPY (EGD) WITH PROPOFOL  N/A 11/07/2022   Procedure: ESOPHAGOGASTRODUODENOSCOPY (EGD) WITH PROPOFOL ;  Surgeon: Luke Salaam, MD;  Location: [facility] ENDOSCOPY;  Service: Gastroenterology;  Laterality: N/A;   JOINT REPLACEMENT  2019   LAPAROSCOPIC GASTRIC SLEEVE RESECTION  02/02/2016   Dr Ovid Blow at [facility]    NEPHRECTOMY  1994   Renal Cell Carcinoma   parotid gland removal  1990   also removed a tumor   POLYPECTOMY  11/07/2022   Procedure: POLYPECTOMY INTESTINAL;  Surgeon: Luke Salaam, MD;  Location: [facility] ENDOSCOPY;  Service: Gastroenterology;;   TONSILLECTOMY     TOTAL HIP ARTHROPLASTY Right 08/08/2016   Procedure: TOTAL HIP ARTHROPLASTY;  Surgeon: Marlynn Singer, MD;  Location: [facility] ORS;  Service: Orthopedics;  Laterality: Right;   TUBAL LIGATION  1979   Patient Active Problem List   Diagnosis Date Noted   B12 deficiency 06/11/2022   Iron deficiency anemia 04/10/2022   Morbid obesity (HCC) 03/30/2022   Vitamin A  deficiency 08/15/2021   Parkinson's disease (HCC) 08/03/2021   Acute urinary retention 08/03/2021   Meningioma (HCC) 04/05/2021   Hypothyroidism 08/15/2020   Osteopenia 06/14/2020   Status post total hip replacement, 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   Osteoarthritis of right hip 03/26/2016   H/O gastric bypass 03/26/2016   Sinus tachycardia 08/01/2015   Obesity (BMI 30-39.9) 06/08/2015   OSA (obstructive sleep apnea) 06/08/2015   GERD without esophagitis 06/08/2015   Incisional hernia 06/08/2015   Arthritis of hip 03/23/2015   Left knee pain 02/15/2015   Vitamin D  deficiency 09/29/2014   Lump of right breast 09/22/2014   Prediabetes 09/22/2014  Pruritic dermatitis 09/22/2014   Obstructive sleep apnea 09/22/2014   Allergic rhinitis 09/15/2009   Hypersomnia 09/14/2009   Panic disorder 08/06/2008   Essential (primary) hypertension 07/16/2007   Hyperlipidemia 07/16/2007    ONSET DATE: Approximately 2 years ago  REFERRING DIAG: R26.89 (ICD-10-CM) - Balance problem   THERAPY DIAG:  Difficulty in walking, not elsewhere classified  Muscle weakness (generalized)  Repeated falls  Other lack of coordination  Dizziness and giddiness  Cognitive communication deficit  Rationale for Evaluation and Treatment: Rehabilitation  SUBJECTIVE:                                                                                                                                                                                              SUBJECTIVE STATEMENT:    Pt reports she is feeling good and optimistic today. Pt states she is planning to go to shopping at [employer] and then to lunch after therapy appointment. Pt reports she has yet to call PCP and couldn't remember why therapist recommended she call.    Pt states she is still planning to get the life alert system, but just hasn't done it yet.  Pt states she purchased a manual wheelchair and is using it outside of the house, but states MD recommended she continue ambulating in the home using rollator.   From visit on 07/30/23: Pt reports after last therapy session she has had a significant decline in her ability to walk. Reports she only made it a short distance out of the therapy clinic and then had to sit and use her rollator to be transported to the car. States at home since then, she has only been able to walk limited distances using rollator with supervision from her husband; otherwise, she is mostly sitting. Reports she feels like her legs are going to just go out from under her. Pt reports the decline is not associated with anything performed at last therapy session, but rather was just ironic timing. Pt states she had this sort of mobility decline happen years ago and was hospitalized with the finding that she had low potassium. Denies any falls since this started. States she hasn't been to the MD regarding this.   States she has been taking the Parkinson's medication for ~1 month today.   Reports she previously had a headache a couple of days ago, but that has gone away. Pt denies low back pain nor any other pain at this time.  Reports husband found out today that he is not eligible for the cancer treatment they were hoping for.  Later in session, husband reports pt has not eaten anything  all day and has overall had limited oral  intake the past several days.  Pt accompanied by: self and family member (husband)  PERTINENT HISTORY: Pt is a 67y.o. female with concern for Parkinsonism due to resting tremors (R>L) with occasional whole-body jerks/myoclonus, bradykinesia, hallucinations while waking up, bilateral bradykinesia (L>R), minimal cogwheeling bilaterally, sensation of stiffness, speech changes, and imbalance (trialed Carbidopa-Levodopa, but intolerable due to side effects of severe nausea & vomiting). Pt with hx of frequent falls and bilateral LE weakness. Pt reports hx of kidney removal on R side with large abdominal incision and R hip replacement.  PAIN:  Are you having pain? No  PRECAUTIONS: Fall  RED FLAGS: None   WEIGHT BEARING RESTRICTIONS: No  FALLS: Has patient fallen in last 6 months? Yes. Number of falls 5-6 (2 in driveway, 1 on sidewalk)  Most recent fall last Thursday 05/23/2023, pt reports she became "disoriented" and had LOB, but no injuries- pt states in general she is "very easily distracted and then she will misstep causing her to have LOB"  LIVING ENVIRONMENT: Lives with: lives with their spouse Autry Legions) Lives in: House/apartment Stairs: Yes: Internal: reports she doesn't have to go to different floors in house (4 steps inside); and External: 3-4 steps to enter with R HRs + courtyard + additional 4-5 steps onto porch with B HRs -  entrance has steep driveway Has following equipment at home: Otho Blitz - 4 wheeled, shower bench, grabbars in bathroom, bedrail, and Ramped entry from garage (but has to go up steep driveway)  PLOF: Independent with basic ADLs, Independent with household mobility with device, Independent with household mobility without device, and Requires assistive device for independence Reports started using rollator more consistently ~2-3weeks ago but has used it on/off for a while  PATIENT GOALS: Wants to feel comfortable moving around and be shown how to do that again  OBJECTIVE:   Note: Objective measures were completed at Evaluation unless otherwise noted.  DIAGNOSTIC FINDINGS:   CT Head without Contrast 04/03/2021: Chronic atrophic changes without acute abnormality.   MRI Brain without Contrast 04/04/2021: 1. No acute intracranial abnormality. 2. Mild age-related cerebral atrophy with chronic small vessel ischemic disease. 3. 1.1 cm meningioma overlying the left frontal convexity without associated mass effect or edema.   CT Cervical Spine without Contrast 04/03/2021: Moderate severity multilevel degenerative changes, most prominent at the levels of C4-C5, C5-C6 and C6-C7. No evidence of an acute fracture or subluxation.   COGNITION: Overall cognitive status: Within functional limits for tasks assessed   SENSATION: WFL Reassessed on 07/30/2023: WNL, denies numbness/tingling in LEs  COORDINATION: WFL for basic functional mobility tasks  EDEMA:  None observed  MUSCLE TONE: WNL  MUSCLE LENGTH: Not tested  DTRs:  Not tested  POSTURE: rounded shoulders, forward head, and increased lumbar lordosis  LOWER EXTREMITY ROM:     Passive  All WFL for basic functional mobility tasks Right Eval Left Eval  Hip flexion    Hip extension    Hip abduction    Hip adduction    Hip internal rotation    Hip external rotation    Knee flexion    Knee extension    Ankle dorsiflexion    Ankle plantarflexion    Ankle inversion    Ankle eversion     (Blank rows = not tested)  LOWER EXTREMITY MMT:    MMT Right Eval Left Eval 07/30/2023 R LE 07/30/2023 L LE  Hip flexion 3+ 4 3 3+  Hip extension  Hip abduction      Hip adduction      Hip internal rotation      Hip external rotation      Knee flexion 4- 4- 3+ 3+  Knee extension 4 4 4- 4-  Ankle dorsiflexion 4 4- 4 4-  Ankle plantarflexion 4- 4- 4- 4-  Ankle inversion      Ankle eversion      (Blank rows = not tested)  Manual Muscle Test Scale 0/5 = No muscle contraction can be seen or felt 1/5 =  Contraction can be felt, but there is no motion 2-/5 = Part moves through incomplete ROM w/ gravity decreased 2/5 = Part moves through complete ROM w/ gravity decreased 2+/5 = Part moves through incomplete ROM (<50%) against gravity or through complete ROM w/ gravity 3-/5 = Part moves through incomplete ROM (>50%) against gravity 3/5 = Part moves through complete ROM against gravity 3+/5 = Part moves through complete ROM against gravity/slight resistance 4-/5= Holds test position against slight to moderate pressure 4/5 = Part moves through complete ROM against gravity/moderate resistance 4+/5= Holds test position against moderate to strong pressure 5/5 = Part moves through complete ROM against gravity/full resistance  BED MOBILITY:  Sit to supine Min A and requires used of bedrail Supine to sit Min A and requires used of bedrail Rolling to Right Min A Rolling to Left Min A Relies on bedrial, difficulty with lateral scooting in the bed (or in sitting)  TRANSFERS: Assistive device utilized: Environmental consultant - 4 wheeled  Sit to stand: SBA and CGA Stand to sit: SBA and CGA Chair to chair: SBA and CGA Without AD requires more consistent CGA/light min A Floor:  not assessed  RAMP:  Assistive device utilized: Environmental consultant - 4 wheeled Ramp Comments: would benefit from assessment of this when able due to steep driveway at home  CURB:   Curb Comments: Not assessed  STAIRS: Level of Assistance: CGA Stair Negotiation Technique: Step to Pattern with Bilateral Rails ascending with R LE and descending with L LE Number of Stairs: 4  Height of Stairs: 6in  Comments: Pt reports feeling more confident when having B HR support  GAIT: Gait pattern: step through pattern, decreased step length- Right, decreased step length- Left, decreased stride length, shuffling, trendelenburg, lateral hip instability, lateral lean- Right, lateral lean- Left, wide BOS, poor foot clearance- Right, and poor foot clearance- Left  --- R/L lateral trunk lean over stance limb, decreased step lengths bilaterally, partial shuffled gait, wide BOS, increased balance instability Distance walked: ~80ft using HHA progressed to no UE support Assistive device utilized:  HHA progressed to no UE support Level of assistance: CGA and Min A Comments: Slow gait speed  FUNCTIONAL TESTS:  5 times sit to stand: on 06/04/2023: 59.25 seconds Timed up and go (TUG): 24.90sec without AD, 22.25 with rollator 3 minute walk test: on 06/04/2023: 242ft using rollator = 80.7meters 10 meter walk test: 36.75 seconds = 0.39m/s without AD; 06/04/23 0.3m/s average gait speed during 3 minute walk test using rollator  Berg Balance Scale: on 06/04/2023 = 31/56 Would benefit from Modified CTSIB & FGA  PATIENT SURVEYS:  ABC scale 17.5%  Details: patient 0% confident with majority of items; 50% confident household level ambulation, 40% stairs, 50% sweeping floor, 50% getting in/out of car, and 90% confident ambulating up/down ramp with rails    Vestibular Assessment  - performed on 06/07/2023 - re-assessed on 2/25 with details in that treatment note  OCULOMOTOR EXAM:  Ocular Alignment: normal  Ocular ROM: No Limitations  Spontaneous Nystagmus: absent  Gaze-Induced Nystagmus: absent and continues to have difficulty keeping eyes on target, especially towards L  Smooth Pursuits:  difficulty keeping eyes on target  Saccades: hypometric/undershoots, extra eye movements, and difficulty finding target on L side  Convergence/Divergence: not assessed Cover/Cross Cover: WNL  VESTIBULAR - OCULAR REFLEX:   VOR Cancellation: Unable to Maintain Gaze, inconsistent ability to keep eyes on target  Head-Impulse Test: HIT Right: negative HIT Left: negative -- had a difficult time keeping eyes on target in both directions  Dynamic Visual Acuity:  would benefit from future testing    POSITIONAL TESTING: Right Dix-Hallpike: upbeating, right nystagmus Left Dix-Hallpike:  benefit from testing at next visit Right Roll Test: no nystagmus Left Roll Test: no nystagmus Left Sidelying: no nystagmus  MOTION SENSITIVITY:  May benefit from testing in future visits                                                                                                   TREATMENT DATE:     08/16/23   Pt again arrives to therapy session in transport chair. Pt appears with overall increased mood and energy today.  Therapist reminded pt of MD's recommendations of trying lemon drops or gum for her dry mouth. Pt confirms she remembers MD's recommendation on the book regarding dealing with loss of taste and smell.  Therapist also encouraged pt to continue ambulating at household level using rollator.  Therapist reinforces education on recommendation to call her PCP to discuss her lack of nutritional intake.  Sit>stand transport chair>rollator with CGA.   Gait training ~61ft using rollator with CGA focusing in increasing gait endurance and overall increasing pt's confidence with ambulation - continues to demo overall decreased gait speed, but pt appears to have improving confidence with gait achieving min reciprocal stepping pattern and decreased back and forth movement of AD.   B UE and B LE reciprocal movement pattern retraining on Nustep for strength and endurance training against level 2 resistance for 2 min, level 3 for 2 minutes and level 4 for 2 minutes totaling 6 minutes and 193 steps - encouraged pt to maintain SPM (steps per minute) >35 with pt sustaining it ~70% of the time due to pt being easily distracted in gym environment.    Gait training ~100-138ft using rollator with CGA - pt not reporting fatigue until final ~71ft of gait distance - same deviations as noted above.  Repeated sit>stands from slightly elevated EOM x5 reps and x4 reps (pt's LEs become fatigued limiting reps on 2nd set) with rollator locked in front of pt for security  - providing cuing for  increased anterior trunk lean when rising due to pt with posterior lean tendency.  Immediately after, pt able to stand from same mat table using rollator with SBA/CGA and participate in gait training ~44ft  back to transport chair as described above.  Pt reports fatigue at end of session.   PATIENT EDUCATION:  Education details: PT POC, balance impairments and increased fall risk based on outcome measures, HEP  Person educated: Patient and Spouse Education method: Explanation Education comprehension: verbalized understanding and needs further education  HOME EXERCISE PROGRAM:  Access Code: [email or web address] URL: [email or web address] Date: 07/23/2023 Prepared by: Carlen Chasten  Exercises - Walking  - 1 x daily - 7 x weekly - 3-5 sets - 3 minutes hold - Sit to Stand with Hands on Knees  - 1 x daily - 7 x weekly - 2 sets - 10 reps - Standing March with Counter Support  - 1 x daily - 7 x weekly - 2 sets - 10 reps - Supine Bridge  - 1 x daily - 7 x weekly - 2 sets - 10 reps    GOALS: Goals reviewed with patient? Yes  SHORT TERM GOALS: Target date: 07/09/2023   Patient will be independent in home exercise program to improve strength/mobility for better functional independence with ADLs. Baseline: provided on 06/04/2023 07/09/23: pt reports performing, will need to progress Goal status: IN PROGRESS  2. Patient will improve ABC scale by 20% indicating increased confidence performing daily mobility tasks, which is associated with fall risk.    Baseline: 05/28/2023 = 17.5%;   07/09/23: need to re-assess; 3/28: 45.6%   Goal Status: MET   LONG TERM GOALS: Target date: 08/20/2023   Patient (> 75 years old) will complete five times sit to stand test in < 15 seconds indicating an increased LE strength and improved balance. Baseline: 06/04/2023: 59.25 seconds 07/09/23: 20.27 seconds using UE support on armrests 3/28: unable to complete 5 sit to stands without UE support - stopped at 3 @   34.9 seconds  Goal status: IN PROGRESS  Patient will increase Berg Balance score by > 6 points to demonstrate decreased fall risk during functional activities. Baseline: 06/04/2023: 31/56 07/09/23: 38/56 Goal Updated: Will increase Berg Balance score to >45/56 Goal status: MET and Revised on 07/09/2023   Patient will reduce timed up and go to <11 seconds to reduce fall risk and demonstrate improved transfer/gait ability. Baseline: 05/28/2023 = 22.25sec using rollator vs 24.90 seconds without AD 07/09/23: 18.35 seconds using rollator and 19.07 sec without AD Goal status: IN PROGRESS  Patient will increase 10 meter walk test to >0.24m/s as to improve gait speed for better community ambulation and to reduce fall risk. Baseline: 05/28/2023 = 0.2m/s 07/09/23: 0.59 m/s (16.90 seconds) using rollator & 0.43 m/s (22.89 seconds) without AD Goal status: IN PROGRESS  Patient will increase 3 minute walk test distance to >383ft for progression to limited community ambulation and improved gait ability Baseline: 06/04/2023 = 261ft (80.60meters) using rollator 07/09/23: need to re-assess 3/28: 290' using rollator  Goal status: IN PROGRESS   ASSESSMENT:  CLINICAL IMPRESSION:   Patient arrived with increased energy and overall appearing to have improved mood today, allowing increased participation in therapy session. Therapist reinforced education on recommendation to follow-up with PCP regarding decreased nutritional intake. Therapy session focused on overall increased endurance training using Nustep and gait training with rollator. Patient also able to return to participating in functional B LE strengthening via repeated sit<>stands from EOM, but fatigues quickly limiting the number of repetitions able to perform. Ms. Goos will benefit from further skilled PT to improve these deficits in order to increase QOL, decrease fal risk, and ease/safety with ADLs.   OBJECTIVE IMPAIRMENTS: Abnormal gait, cardiopulmonary  status limiting activity, decreased activity tolerance, decreased balance, decreased endurance, decreased mobility, difficulty walking, and decreased strength.   ACTIVITY LIMITATIONS: carrying, lifting, bending, standing, squatting, stairs, transfers, bed mobility, reach over head,  and locomotion level  PARTICIPATION LIMITATIONS: meal prep, cleaning, laundry, shopping, and community activity  PERSONAL FACTORS: Age, Behavior pattern, Fitness, Time since onset of injury/illness/exacerbation, and 3+ comorbidities: Parkinsonism, anxiety, arthritis, depression, HTN  are also affecting patient's functional outcome.   REHAB POTENTIAL: Good  CLINICAL DECISION MAKING: Evolving/moderate complexity  EVALUATION COMPLEXITY: Moderate  PLAN:  PT FREQUENCY: 1-2x/week  PT DURATION: 12 weeks  PLANNED INTERVENTIONS: 97164- PT Re-evaluation, 97110-Therapeutic exercises, 97530- Therapeutic activity, 97112- Neuromuscular re-education, 97535- Self Care, 16109- Manual therapy, [phone]- Gait training, [phone]- Canalith repositioning, Patient/Family education, Balance training, Stair training, Vestibular training, Visual/preceptual remediation/compensation, Cognitive remediation, and DME instructions   PLAN FOR NEXT SESSION:  *Progress note* *follow-up on pt scheduling apt with PCP regarding minimal nutritional intake - follow-up on recommendation to look into a nutritional psychologist - follow-up on getting life alert system - Nustep - progressive increase in activity following recent, sudden decline in mobility on 07/26/2023 - follow-up on vestibular treatment from 06/28/2023 if needed - B LE functional strengthening   - adding weights/resistance training as appropriate   - continue bridges - dynamic balance and gait challenges with gaze stabilization dual-task challenges  - turning and stepping in different directions over small obstacles - lateral side stepping, backwards stepping in // bars *pt very  fearful of falling with balance interventions*   Barnaby Rippeon Corinthia Dickinson, PT, DPT Physical Therapist - [facility]  11:18 AM 08/16/23

## 2023-08-20 ENCOUNTER — Ambulatory Visit: Payer: Medicare Other | Admitting: Physical Therapy

## 2023-08-20 ENCOUNTER — Encounter: Payer: Medicare Other | Admitting: Speech Pathology

## 2023-08-23 ENCOUNTER — Ambulatory Visit: Payer: Medicare Other | Admitting: Physical Therapy

## 2023-08-23 ENCOUNTER — Encounter: Payer: Medicare Other | Admitting: Speech Pathology

## 2023-08-27 ENCOUNTER — Encounter: Payer: Self-pay | Admitting: Family Medicine

## 2023-08-27 ENCOUNTER — Ambulatory Visit: Admitting: Family Medicine

## 2023-08-27 ENCOUNTER — Other Ambulatory Visit: Payer: Self-pay | Admitting: Family Medicine

## 2023-08-27 ENCOUNTER — Ambulatory Visit: Payer: Medicare Other | Admitting: Physical Therapy

## 2023-08-27 ENCOUNTER — Encounter: Payer: Medicare Other | Admitting: Speech Pathology

## 2023-08-27 VITALS — BP 114/78 | HR 85 | Temp 99.1°F

## 2023-08-27 DIAGNOSIS — B353 Tinea pedis: Secondary | ICD-10-CM

## 2023-08-27 DIAGNOSIS — K117 Disturbances of salivary secretion: Secondary | ICD-10-CM

## 2023-08-27 DIAGNOSIS — R296 Repeated falls: Secondary | ICD-10-CM | POA: Diagnosis not present

## 2023-08-27 DIAGNOSIS — R42 Dizziness and giddiness: Secondary | ICD-10-CM | POA: Diagnosis not present

## 2023-08-27 DIAGNOSIS — G20A1 Parkinson's disease without dyskinesia, without mention of fluctuations: Secondary | ICD-10-CM | POA: Diagnosis not present

## 2023-08-27 DIAGNOSIS — R262 Difficulty in walking, not elsewhere classified: Secondary | ICD-10-CM

## 2023-08-27 DIAGNOSIS — R278 Other lack of coordination: Secondary | ICD-10-CM | POA: Diagnosis not present

## 2023-08-27 DIAGNOSIS — F410 Panic disorder [episodic paroxysmal anxiety] without agoraphobia: Secondary | ICD-10-CM

## 2023-08-27 DIAGNOSIS — E039 Hypothyroidism, unspecified: Secondary | ICD-10-CM

## 2023-08-27 DIAGNOSIS — M6281 Muscle weakness (generalized): Secondary | ICD-10-CM | POA: Diagnosis not present

## 2023-08-27 DIAGNOSIS — F331 Major depressive disorder, recurrent, moderate: Secondary | ICD-10-CM

## 2023-08-27 DIAGNOSIS — E785 Hyperlipidemia, unspecified: Secondary | ICD-10-CM

## 2023-08-27 MED ORDER — LEVOTHYROXINE SODIUM 88 MCG PO TABS
44.0000 ug | ORAL_TABLET | Freq: Every day | ORAL | 1 refills | Status: AC
Start: 2023-08-27 — End: ?

## 2023-08-27 MED ORDER — KETOCONAZOLE 2 % EX CREA: 1.0000 | TOPICAL_CREAM | Freq: Every day | CUTANEOUS | 0 refills | Status: DC

## 2023-08-27 MED ORDER — ALPRAZOLAM 0.25 MG PO TABS: 0.2500 mg | ORAL_TABLET | Freq: Two times a day (BID) | ORAL | 0 refills | Status: DC | PRN

## 2023-08-27 NOTE — Therapy (Signed)
 OUTPATIENT PHYSICAL THERAPY NEURO TREATMENT/RECERT   and  Physical Therapy Progress Note   Dates of reporting period  07/09/2023   to   08/27/2023   Patient Name: Brittany Benton MRN: [id number] DOB:01-Nov-1955, 68 y.o., female Today's Date: 08/27/2023   PCP: Lamon Pillow, MD REFERRING PROVIDER: Rosan Comfort, MD   END OF SESSION:    PT End of Session - 08/27/23 1023     Visit Number 20    Number of Visits 44    Date for PT Re-Evaluation 11/19/23    Authorization Type [employer] 2025 - auth [id number] for 16 visits    PT Start Time 1020    PT Stop Time 1101    PT Time Calculation (min) 41 min    Equipment Utilized During Treatment Gait belt    Activity Tolerance Patient tolerated treatment well    Behavior During Therapy WFL for tasks assessed/performed               Past Medical History:  Diagnosis Date   Allergy 1989   Anemia    Anxiety    Arthritis    osteoarthritis -right hip   Cataract    Depression    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   GERD (gastroesophageal reflux disease)    Hernia, incisional    after renal surgery   History of chicken pox    Hyperlipidemia    Hypertension    Renal cell carcinoma (HCC)    s/p right nephrectomy 1994   Sleep apnea    Thyroid  disease    Past Surgical History:  Procedure Laterality Date   APPENDECTOMY  1994   BIOPSY  11/07/2022   Procedure: BIOPSY;  Surgeon: Luke Salaam, MD;  Location: [facility] ENDOSCOPY;  Service: Gastroenterology;;   BREAST BIOPSY Right 1991   Negative   CHOLECYSTECTOMY  1994   COLONOSCOPY WITH PROPOFOL  N/A 11/07/2022   Procedure: COLONOSCOPY WITH PROPOFOL ;  Surgeon: Luke Salaam, MD;  Location: [facility] ENDOSCOPY;  Service: Gastroenterology;  Laterality: N/A;   ESOPHAGOGASTRODUODENOSCOPY (EGD) WITH PROPOFOL  N/A 11/07/2022   Procedure: ESOPHAGOGASTRODUODENOSCOPY (EGD) WITH PROPOFOL ;  Surgeon: Luke Salaam, MD;  Location: [facility] ENDOSCOPY;  Service: Gastroenterology;  Laterality:  N/A;   JOINT REPLACEMENT  2019   LAPAROSCOPIC GASTRIC SLEEVE RESECTION  02/02/2016   Dr Ovid Blow at [facility]   NEPHRECTOMY  1994   Renal Cell Carcinoma   parotid gland removal  1990   also removed a tumor   POLYPECTOMY  11/07/2022   Procedure: POLYPECTOMY INTESTINAL;  Surgeon: Luke Salaam, MD;  Location: [facility] ENDOSCOPY;  Service: Gastroenterology;;   TONSILLECTOMY     TOTAL HIP ARTHROPLASTY Right 08/08/2016   Procedure: TOTAL HIP ARTHROPLASTY;  Surgeon: Marlynn Singer, MD;  Location: [facility] ORS;  Service: Orthopedics;  Laterality: Right;   TUBAL LIGATION  1979   Patient Active Problem List   Diagnosis Date Noted   B12 deficiency 06/11/2022   Iron deficiency anemia 04/10/2022   Morbid obesity (HCC) 03/30/2022   Vitamin A  deficiency 08/15/2021   Parkinson's disease (HCC) 08/03/2021   Acute urinary retention 08/03/2021   Meningioma (HCC) 04/05/2021   Hypothyroidism 08/15/2020   Osteopenia 06/14/2020   Status post total hip replacement, 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   Osteoarthritis of right hip 03/26/2016   H/O gastric bypass 03/26/2016   Sinus tachycardia 08/01/2015   Obesity (BMI 30-39.9) 06/08/2015   OSA (obstructive sleep apnea) 06/08/2015   GERD without esophagitis 06/08/2015   Incisional hernia 06/08/2015   Arthritis of hip 03/23/2015  Left knee pain 02/15/2015   Vitamin D  deficiency 09/29/2014   Lump of right breast 09/22/2014   Prediabetes 09/22/2014   Pruritic dermatitis 09/22/2014   Obstructive sleep apnea 09/22/2014   Allergic rhinitis 09/15/2009   Hypersomnia 09/14/2009   Panic disorder 08/06/2008   Essential (primary) hypertension 07/16/2007   Hyperlipidemia 07/16/2007    ONSET DATE: Approximately 2 years ago  REFERRING DIAG: R26.89 (ICD-10-CM) - Balance problem   THERAPY DIAG:  Difficulty in walking, not elsewhere classified  Muscle weakness (generalized)  Repeated falls  Other lack of coordination  Dizziness and giddiness  Rationale for Evaluation and  Treatment: Rehabilitation  SUBJECTIVE:                                                                                                                                                                                             SUBJECTIVE STATEMENT:    Pt states she fell into her husband outside in the yard approximately 1 week ago. Reports they had to call EMS to help her get up. Pt reports because of that fall she is afraid of walking again. Pt states she has to use a wheelchair at all times to get out in the community now.   Pt states she saw her PCP today immediately prior to this therapy apt. Pt states her dry mouth is coming from one of her medications. Pt states her PCP is going to set her up with a Engineer, civil (consulting) to help with pill management. Pt states the gum and lemon drops do not help with the dry mouth and may be making it worse. Pt states she is having a difficult time remembering all of what the doctor told her.   Pt states "I'm just very depressed today."  Pt states she just doesn't want to eat, stating she "chews it up and spits it out" referring to her food.   Pt states she has some "outburst" that are "really hard to deal with." Pt states she gets really angry with her husband and acts out both verbally and physically towards him.  Pt states she has had counseling in the past and really likes it.   Per pt's husband, pt was also referred to psychiatry by her PCP.   From visit on 07/30/23: Pt reports after last therapy session she has had a significant decline in her ability to walk. Reports she only made it a short distance out of the therapy clinic and then had to sit and use her rollator to be transported to the car. States at home since then, she has only been able to walk limited distances using  rollator with supervision from her husband; otherwise, she is mostly sitting. Reports she feels like her legs are going to just go out from under her. Pt reports the decline  is not associated with anything performed at last therapy session, but rather was just ironic timing. Pt states she had this sort of mobility decline happen years ago and was hospitalized with the finding that she had low potassium. Denies any falls since this started. States she hasn't been to the MD regarding this.   States she has been taking the Parkinson's medication for ~1 month today.   Reports she previously had a headache a couple of days ago, but that has gone away. Pt denies low back pain nor any other pain at this time.  Reports husband found out today that he is not eligible for the cancer treatment they were hoping for.  Later in session, husband reports pt has not eaten anything all day and has overall had limited oral intake the past several days.  Pt accompanied by: self and family member (husband)  PERTINENT HISTORY: Pt is a 67y.o. female with concern for Parkinsonism due to resting tremors (R>L) with occasional whole-body jerks/myoclonus, bradykinesia, hallucinations while waking up, bilateral bradykinesia (L>R), minimal cogwheeling bilaterally, sensation of stiffness, speech changes, and imbalance (trialed Carbidopa-Levodopa, but intolerable due to side effects of severe nausea & vomiting). Pt with hx of frequent falls and bilateral LE weakness. Pt reports hx of kidney removal on R side with large abdominal incision and R hip replacement.  PAIN:  Are you having pain? No  PRECAUTIONS: Fall  RED FLAGS: None   WEIGHT BEARING RESTRICTIONS: No  FALLS: Has patient fallen in last 6 months? Yes. Number of falls 5-6 (2 in driveway, 1 on sidewalk) Most recent fall last Thursday 05/23/2023, pt reports she became "disoriented" and had LOB, but no injuries- pt states in general she is "very easily distracted and then she will misstep causing her to have LOB"  LIVING ENVIRONMENT: Lives with: lives with their spouse Autry Legions) Lives in: House/apartment Stairs: Yes: Internal: reports she  doesn't have to go to different floors in house (4 steps inside); and External: 3-4 steps to enter with R HRs + courtyard + additional 4-5 steps onto porch with B HRs -  entrance has steep driveway Has following equipment at home: Otho Blitz - 4 wheeled, shower bench, grabbars in bathroom, bedrail, and Ramped entry from garage (but has to go up steep driveway)  PLOF: Independent with basic ADLs, Independent with household mobility with device, Independent with household mobility without device, and Requires assistive device for independence Reports started using rollator more consistently ~2-3weeks ago but has used it on/off for a while  PATIENT GOALS: Wants to feel comfortable moving around and be shown how to do that again  OBJECTIVE:  Note: Objective measures were completed at Evaluation unless otherwise noted.  DIAGNOSTIC FINDINGS:   CT Head without Contrast 04/03/2021: Chronic atrophic changes without acute abnormality.   MRI Brain without Contrast 04/04/2021: 1. No acute intracranial abnormality. 2. Mild age-related cerebral atrophy with chronic small vessel ischemic disease. 3. 1.1 cm meningioma overlying the left frontal convexity without associated mass effect or edema.   CT Cervical Spine without Contrast 04/03/2021: Moderate severity multilevel degenerative changes, most prominent at the levels of C4-C5, C5-C6 and C6-C7. No evidence of an acute fracture or subluxation.   COGNITION: Overall cognitive status: Within functional limits for tasks assessed   SENSATION: WFL Reassessed on 07/30/2023: WNL, denies numbness/tingling in LEs  COORDINATION: WFL for basic functional mobility tasks  EDEMA:  None observed  MUSCLE TONE: WNL  MUSCLE LENGTH: Not tested  DTRs:  Not tested  POSTURE: rounded shoulders, forward head, and increased lumbar lordosis  LOWER EXTREMITY ROM:     Passive  All WFL for basic functional mobility tasks Right Eval Left Eval  Hip flexion    Hip  extension    Hip abduction    Hip adduction    Hip internal rotation    Hip external rotation    Knee flexion    Knee extension    Ankle dorsiflexion    Ankle plantarflexion    Ankle inversion    Ankle eversion     (Blank rows = not tested)  LOWER EXTREMITY MMT:    MMT Right Eval Left Eval 07/30/2023 R LE 07/30/2023 L LE  Hip flexion 3+ 4 3 3+  Hip extension      Hip abduction      Hip adduction      Hip internal rotation      Hip external rotation      Knee flexion 4- 4- 3+ 3+  Knee extension 4 4 4- 4-  Ankle dorsiflexion 4 4- 4 4-  Ankle plantarflexion 4- 4- 4- 4-  Ankle inversion      Ankle eversion      (Blank rows = not tested)  Manual Muscle Test Scale 0/5 = No muscle contraction can be seen or felt 1/5 = Contraction can be felt, but there is no motion 2-/5 = Part moves through incomplete ROM w/ gravity decreased 2/5 = Part moves through complete ROM w/ gravity decreased 2+/5 = Part moves through incomplete ROM (<50%) against gravity or through complete ROM w/ gravity 3-/5 = Part moves through incomplete ROM (>50%) against gravity 3/5 = Part moves through complete ROM against gravity 3+/5 = Part moves through complete ROM against gravity/slight resistance 4-/5= Holds test position against slight to moderate pressure 4/5 = Part moves through complete ROM against gravity/moderate resistance 4+/5= Holds test position against moderate to strong pressure 5/5 = Part moves through complete ROM against gravity/full resistance  BED MOBILITY:  Sit to supine Min A and requires used of bedrail Supine to sit Min A and requires used of bedrail Rolling to Right Min A Rolling to Left Min A Relies on bedrial, difficulty with lateral scooting in the bed (or in sitting)  TRANSFERS: Assistive device utilized: Environmental consultant - 4 wheeled  Sit to stand: SBA and CGA Stand to sit: SBA and CGA Chair to chair: SBA and CGA Without AD requires more consistent CGA/light min A Floor: not  assessed  RAMP:  Assistive device utilized: Environmental consultant - 4 wheeled Ramp Comments: would benefit from assessment of this when able due to steep driveway at home  CURB:   Curb Comments: Not assessed  STAIRS: Level of Assistance: CGA Stair Negotiation Technique: Step to Pattern with Bilateral Rails ascending with R LE and descending with L LE Number of Stairs: 4  Height of Stairs: 6in  Comments: Pt reports feeling more confident when having B HR support  GAIT: Gait pattern: step through pattern, decreased step length- Right, decreased step length- Left, decreased stride length, shuffling, trendelenburg, lateral hip instability, lateral lean- Right, lateral lean- Left, wide BOS, poor foot clearance- Right, and poor foot clearance- Left --- R/L lateral trunk lean over stance limb, decreased step lengths bilaterally, partial shuffled gait, wide BOS, increased balance instability Distance walked: ~9ft using HHA progressed to no  UE support Assistive device utilized: HHA progressed to no UE support Level of assistance: CGA and Min A Comments: Slow gait speed  FUNCTIONAL TESTS:  5 times sit to stand: on 06/04/2023: 59.25 seconds Timed up and go (TUG): 24.90sec without AD, 22.25 with rollator 3 minute walk test: on 06/04/2023: 252ft using rollator = 80.81meters 10 meter walk test: 36.75 seconds = 0.25m/s without AD; 06/04/23 0.4m/s average gait speed during 3 minute walk test using rollator  Berg Balance Scale: on 06/04/2023 = 31/56 Would benefit from Modified CTSIB & FGA  PATIENT SURVEYS:  ABC scale 17.5% Details: patient 0% confident with majority of items; 50% confident household level ambulation, 40% stairs, 50% sweeping floor, 50% getting in/out of car, and 90% confident ambulating up/down ramp with rails    Vestibular Assessment  - performed on 06/07/2023 - re-assessed on 2/25 with details in that treatment note  OCULOMOTOR EXAM:  Ocular Alignment: normal  Ocular ROM: No  Limitations  Spontaneous Nystagmus: absent  Gaze-Induced Nystagmus: absent and continues to have difficulty keeping eyes on target, especially towards L  Smooth Pursuits: difficulty keeping eyes on target  Saccades: hypometric/undershoots, extra eye movements, and difficulty finding target on L side  Convergence/Divergence: not assessed Cover/Cross Cover: WNL  VESTIBULAR - OCULAR REFLEX:   VOR Cancellation: Unable to Maintain Gaze, inconsistent ability to keep eyes on target  Head-Impulse Test: HIT Right: negative HIT Left: negative -- had a difficult time keeping eyes on target in both directions  Dynamic Visual Acuity: would benefit from future testing   POSITIONAL TESTING: Right Dix-Hallpike: upbeating, right nystagmus Left Dix-Hallpike: benefit from testing at next visit Right Roll Test: no nystagmus Left Roll Test: no nystagmus Left Sidelying: no nystagmus  MOTION SENSITIVITY:  May benefit from testing in future visits                                                                                                   TREATMENT DATE:     08/27/23   Pt again arrives to therapy session in transport chair.  Educated pt on plan for progress note and recert today to extend her POC given recent change in functional mobility level following change in medical/nutritional status.   Therapy session focused on re-assessment of standardized outcome measures and subjective questionnaire to determine pt's progress with therapy thus far.  Sit>stand transport chair>rollator with CGA.   Pt reports she hasn't really walked since last therapy session because she doesn't feel safe at home like she does at therapy with therapist guarding her and in the controlled clinic environment. Therapist educated pt on goal of helping pt regain her confidence to return to ambulating at home with increased independence using AD. Therapist encouraged pt to seek counseling or psychiatric support along side of  physical therapy for a more holistic approach to address her increased fear of falling and recent overall health decline with a lack of appetite and low energy levels.   10 Meter Walk Test: Patient instructed to walk 10 meters (32.8 ft) as quickly and as safely as possible Results: 0.212 m/s (55.36 seconds and  38.80 seconds = avg: 47.08sec) using rollator with close SBA/CGA for safety  Cut off scores:   Household Ambulator  < 0.4 m/s  Limited Community Ambulator  0.4 - 0.8 m/s  [employer]  > 0.8 m/s  Increased fall risk  < 1.42m/s  Crossing a Street  >1.29m/s  MCID 0.05 m/s (small), 0.13 m/s (moderate), 0.06 m/s (significant)  (ANPTA Core Set of Outcome Measures for Adults with Neurologic Conditions, 2018)    **Pt started to dry heave after 1st gait trial, but no emesis production. Provided pt with comfort and emotional support during this time. Pt states this happens sometimes and it passed within a couple of minutes.   Participated in Timed Up and Go (TUG): 1st trial: 32.89 seconds 2nd trial: 38.06 seconds  Average: 35.48 seconds using rollator with close SBA/CGA for safety *pt with difficult time turning and lining up fully with chair due to likely increased fear of falling when turning and stepping backwards to the chair Patient demonstrates high fall risk as indicated by requiring >13.5seconds to complete the TUG.   Activities-specific Balance Confidence Scale:  Score: 44.375% with pt reporting only 50% confidence walking around the house, and navigating stairs; however, reporting 70-80% picking up slipper from ground, reaching over head to grab something, walking to parked car in driveway, walking across parking lot to mall, walking on ramp,  and getting in/out of a car. Patient with 0% confidence sweeping floor, standing on chair to reach for something, walking in crowded mall, and being bumped into [Increased risk of falls in community-dwelling, older adults <80%  (79.89%)  0% = no confidence - 100% = complete confidence (ANPTA Core Set of Outcome Measures for Adults with Neurologic Conditions, 2018)]    PATIENT EDUCATION:  Education details: PT POC, balance impairments and increased fall risk based on outcome measures, HEP Person educated: Patient and Spouse Education method: Explanation Education comprehension: verbalized understanding and needs further education  HOME EXERCISE PROGRAM:  Access Code: [email or web address] URL: [email or web address] Date: 07/23/2023 Prepared by: Carlen Chasten  Exercises - Walking  - 1 x daily - 7 x weekly - 3-5 sets - 3 minutes hold - Sit to Stand with Hands on Knees  - 1 x daily - 7 x weekly - 2 sets - 10 reps - Standing March with Counter Support  - 1 x daily - 7 x weekly - 2 sets - 10 reps - Supine Bridge  - 1 x daily - 7 x weekly - 2 sets - 10 reps    GOALS: Goals reviewed with patient? Yes  SHORT TERM GOALS: Target date: 10/08/2023   Patient will be independent in home exercise program to improve strength/mobility for better functional independence with ADLs. Baseline: provided on 06/04/2023 07/09/23: pt reports performing, will need to progress 08/27/2023: pt with recent decline, not ambulating at home at this time Goal status: IN PROGRESS  2. Patient will improve ABC scale by 20% indicating increased confidence performing daily mobility tasks, which is associated with fall risk.    Baseline: 05/28/2023 = 17.5%;   07/09/23: need to re-assess; 3/28: 45.6% 08/27/2023: 44.375%    Goal Status: IN PROGRESS   LONG TERM GOALS: Target date: 11/19/2023   Patient (> 24 years old) will complete five times sit to stand test in < 15 seconds indicating an increased LE strength and improved balance. Baseline: 06/04/2023: 59.25 seconds 07/09/23: 20.27 seconds using UE support on armrests 3/28: unable to complete 5 sit to stands without UE support -  stopped at 3 @  34.9 seconds  08/27/2023: need to assess Goal  status: IN PROGRESS  Patient will increase Berg Balance score by > 6 points to demonstrate decreased fall risk during functional activities. Baseline: 06/04/2023: 31/56 07/09/23: 38/56 Goal Updated: Will increase Berg Balance score to >45/56 08/27/2023: need to assess Goal status: MET and Revised on 07/09/2023   Patient will reduce timed up and go to <11 seconds to reduce fall risk and demonstrate improved transfer/gait ability. Baseline: 05/28/2023 = 22.25sec using rollator vs 24.90 seconds without AD 07/09/23: 18.35 seconds using rollator and 19.07 sec without AD 08/27/2023: 35.48 seconds using rollator with close SBA/CGA  Goal status: IN PROGRESS  Patient will increase 10 meter walk test to >0.75m/s as to improve gait speed for better community ambulation and to reduce fall risk. Baseline: 05/28/2023 = 0.35m/s 07/09/23: 0.59 m/s (16.90 seconds) using rollator & 0.43 m/s (22.89 seconds) without AD 08/27/2023: 0.212 m/s using rollator with close SBA/CGA for safety Goal status: IN PROGRESS  Patient will increase 3 minute walk test distance to >351ft for progression to limited community ambulation and improved gait ability Baseline: 06/04/2023 = 265ft (80.57meters) using rollator 07/09/23: need to re-assess 3/28: 290' using rollator  08/27/2023: need to assess Goal status: IN PROGRESS   ASSESSMENT:  CLINICAL IMPRESSION:   Patient arrived with reports of feeling overall depressed and stating increased fear of falling caused by a fall approximately 1 week ago as stated above. Patient followed up with her PCP this morning, immediately prior to this therapy appointment, and received referral to psychiatry to address pt's increased fear as well as help with her reported "outbursts" as pt expresses feeling overall increased anger. Therapist provided encouragement to patient to receive that support along side of physical therapy to provide a more holistic approach to helping her return to an ambulatory  level with increased independence using an AD. Patient participated in re-assessment of outcome measures indicating a decline in her gait speed and TUG associated with her increased fear of falling. Patient also completed the ABC subjective questionnaire reporting only a 44.375% confidence performing daily functional mobility tasks without having LOB. All of this is placing patient at a high fall risk, indicating she will benefit from continued skilled physical therapy to increase her safety and maintain her mobility level to decrease risk of hospitalization. Patient is now receiving greater interdisciplinary healthcare support following her mobility decline that started on 07/26/2023. Brittany Benton will benefit from further skilled PT to improve these deficits in order to increase QOL, decrease fal risk, and ease/safety with ADLs. Patient's condition has the potential to improve in response to therapy. Maximum improvement is yet to be obtained. The anticipated improvement is attainable and reasonable in a generally predictable time.    OBJECTIVE IMPAIRMENTS: Abnormal gait, cardiopulmonary status limiting activity, decreased activity tolerance, decreased balance, decreased endurance, decreased mobility, difficulty walking, and decreased strength.   ACTIVITY LIMITATIONS: carrying, lifting, bending, standing, squatting, stairs, transfers, bed mobility, reach over head, and locomotion level  PARTICIPATION LIMITATIONS: meal prep, cleaning, laundry, shopping, and community activity  PERSONAL FACTORS: Age, Behavior pattern, Fitness, Time since onset of injury/illness/exacerbation, and 3+ comorbidities: Parkinsonism, anxiety, arthritis, depression, HTN are also affecting patient's functional outcome.   REHAB POTENTIAL: Good  CLINICAL DECISION MAKING: Evolving/moderate complexity  EVALUATION COMPLEXITY: Moderate  PLAN:  PT FREQUENCY: 1-2x/week  PT DURATION: 12 weeks  PLANNED INTERVENTIONS: 97164- PT  Re-evaluation, 97110-Therapeutic exercises, 97530- Therapeutic activity, [id number]- Neuromuscular re-education, 97535- Self Care, 16109- Manual therapy, [id number]-  Gait training, 16109- Canalith repositioning, Patient/Family education, Balance training, Stair training, Vestibular training, Visual/preceptual remediation/compensation, Cognitive remediation, and DME instructions   PLAN FOR NEXT SESSION:  *Progress note further assessments: 5xSTS Berg walk test - follow-up on PCP referral to psychiatry - follow-up on getting life alert system - Nustep - progressive increase in activity following recent, sudden decline in mobility on 07/26/2023 - follow-up on vestibular treatment from 06/28/2023 if needed - B LE functional strengthening   - adding weights/resistance training as appropriate   - continue bridges - dynamic balance and gait challenges with gaze stabilization dual-task challenges  - turning and stepping in different directions over small obstacles - lateral side stepping, backwards stepping in // bars *pt very fearful of falling with balance interventions*   Juandiego Kolenovic Corinthia Dickinson, PT, DPT Physical Therapist - [facility]  [facility]  11:22 AM 08/27/23

## 2023-08-27 NOTE — Progress Notes (Signed)
 Established patient visit   Patient: Brittany TRUGLIO   DOB: 1955-05-03   68 y.o. Female  MRN: [id number] Visit Date: 08/27/2023  Today's healthcare provider: Carlean Charter, DO   No chief complaint on file.  Subjective    HPI Brittany Benton is a 68 year old female with Parkinson's disease who presents with dry mouth and decreased appetite. She is accompanied by her husband, Brittany Benton.  She experiences significant xerostomia, describing her mouth as persistently dry with occasional muscle stiffness. Various liquids and lemon drops have been ineffective. This issue has recurred over months or even a year, with no saliva production making it difficult to eat foods like popcorn. This is the second occurrence in five years, raising concerns about a potential pattern.  She has a lack of appetite, stating she can go without eating for extended periods and is never hungry, partly due to the dry mouth making eating difficult. She has lost weight, which she attributes to not eating.  She has a history of thyroid  issues and is on levothyroxine , which has not been changed in years. She experienced severe dry mouth two years ago when admitted to a facility.  She experiences bowel urgency, stating that when she needs to have a bowel movement, it is immediate, and if she does not go, she may soil herself. Despite extensive testing, no cause has been identified.  She has a long-standing rash on her foot, present for 20-25 years, which comes and goes and has worsened recently, spreading from her toes to the back of her foot, while other areas of her body remain clear.  She reports episodes of violent anger, which she finds embarrassing and difficult to control. She has been on imipramine  for 20 years and was recently prescribed Zoloft , which helps during the day and night, but she still experiences outbursts. She has not seen a psychiatrist or counselor for these issues.    Labs not recently  checked: Low b1 in 2023 52.7 Low vit D in 2016 and 2021 - normal in 2023     Medications: Outpatient Medications Prior to Visit  Medication Sig   Ascorbic Acid (VITAMIN C PO) Take by mouth.   atorvastatin  (LIPITOR) 20 MG tablet TAKE 1 TABLET BY MOUTH EVERY EVENING   Cholecalciferol  (VITAMIN D -3) 1000 units CAPS Take 1 capsule by mouth daily.   imipramine  (TOFRANIL ) 50 MG tablet TAKE ONE TABLET BY MOUTH TWICE DAILY   metoprolol  succinate (TOPROL -XL) 50 MG 24 hr tablet TAKE ONE TABLET BY MOUTH EVERY DAY WITH OR IMMEDIATELY FOLLOWING A MEAL   Multiple Vitamins-Minerals (MULTIVITAMIN ADULT PO) Take 1 tablet by mouth daily.   omeprazole  (PRILOSEC OTC) 20 MG tablet Take 20 mg by mouth daily.   omeprazole  (PRILOSEC) 20 MG capsule TAKE 1 CAPSULE BY MOUTH ONCE DAILY   sertraline  (ZOLOFT ) 100 MG tablet TAKE ONE TABLET BY MOUTH DAILY   [DISCONTINUED] levothyroxine  (SYNTHROID ) 50 MCG tablet TAKE ONE TABLET BY MOUTH ONCE DAILY   No facility-administered medications prior to visit.        Objective    There were no vitals taken for this visit.    Physical Exam Vitals and nursing note reviewed.  Constitutional:      General: She is not in acute distress.    Appearance: Normal appearance.  HENT:     Head: Normocephalic and atraumatic.     Mouth/Throat:     Mouth: Mucous membranes are dry.  Eyes:  General: No scleral icterus.    Conjunctiva/sclera: Conjunctivae normal.  Cardiovascular:     Rate and Rhythm: Normal rate.  Pulmonary:     Effort: Pulmonary effort is normal.  Musculoskeletal:       Feet:  Feet:     Comments: Rash as denoted. Neurological:     Mental Status: She is alert and oriented to person, place, and time. Mental status is at baseline.  Psychiatric:        Mood and Affect: Mood normal.        Behavior: Behavior normal.      No results found for any visits on 08/27/23.  Assessment & Plan    Xerostomia -     VITAMIN D  25 Hydroxy (Vit-D Deficiency,  Fractures) -     Vitamin B1  Tinea pedis of right foot -     Ketoconazole; Apply 1 Application topically daily. Until rash resolves completely.  Dispense: 60 g; Refill: 0  Hypothyroidism, unspecified type -     Levothyroxine  Sodium; Take 0.5 tablets (44 mcg total) by mouth daily.  Dispense: 45 tablet; Refill: 1  Parkinson's disease, unspecified whether dyskinesia present, unspecified whether manifestations fluctuate (HCC) -     Ambulatory referral to Psychiatry -     AMB Referral [facility]  Moderate episode of recurrent major depressive disorder (HCC) -     Ambulatory referral to Psychiatry -     AMB Referral [facility]  Panic disorder -     Ambulatory referral to Psychiatry -     AMB Referral [facility] -     ALPRAZolam ; Take 1 tablet (0.25 mg total) by mouth 2 (two) times daily as needed for anxiety (agitation).  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 Previous remedies ineffective. - Recommend Biotene mouthwash for xerostomia. - Check vitamin B1 and vitamin D  levels. - Lower levothyroxine  dose to 44 mcg daily.  Recheck in 6 weeks  Hypothyroidism Mildly elevated free thyroid  hormone levels with normal TSH. Symptoms include xerostomia and weight loss. Adjusted levothyroxine  due to potential hyperthyroid symptoms. - Lower levothyroxine  dose by half a tablet. - Recheck thyroid  levels in six weeks.  Weight loss Unintentional weight loss with anorexia and xerostomia, possibly related to hyperthyroidism and medication. - Check vitamin B1 and vitamin D  levels. - Lower levothyroxine  dose to 44 mcg daily (1/2 of an 88 mcg tablet).  Depression Depression with violent anger episodes, possibly exacerbated by medical conditions. Current medications may be ineffective. - Refer to psychiatry for counseling and medication management. - Prescribed short course of low-dose Xanax   to manage severe episodes of agitation  Tinea pedis Chronic tinea pedis, currently exacerbated and spreading.  Moccasin distribution pattern. - Prescribe clotrimazole cream for tinea pedis.   No follow-ups on file.      I discussed the assessment and treatment plan with the patient  The patient was provided an opportunity to ask questions and all were answered. The patient agreed with the plan and demonstrated an understanding of the instructions.   The patient was advised to call back or seek an in-person evaluation if the symptoms worsen or if the condition fails to improve as anticipated.    Carlean Charter, DO  [facility] [phone] (phone) [phone] (fax)  [facility]

## 2023-08-27 NOTE — Patient Instructions (Addendum)
 Recommend Biotene mouthwash

## 2023-08-29 NOTE — Therapy (Signed)
 OUTPATIENT PHYSICAL THERAPY NEURO TREATMENT/DISCHARGE   Patient Name: Brittany Benton MRN: [id number] DOB:Nov 06, 1955, 68 y.o., female Today's Date: 08/30/2023   PCP: Lamon Pillow, MD REFERRING PROVIDER: Rosan Comfort, MD   END OF SESSION:    PT End of Session - 08/30/23 1108     Visit Number 21    Number of Visits 44    Date for PT Re-Evaluation 11/19/23    Authorization Type [employer] Medicare 2025 - auth 4/18 - 5/16 for 8 visits    PT Start Time 1030    PT Stop Time 1054    PT Time Calculation (min) 24 min    Activity Tolerance Patient tolerated treatment well    Behavior During Therapy [facility] for tasks assessed/performed                Past Medical History:  Diagnosis Date   Allergy 1989   Anemia    Anxiety    Arthritis    osteoarthritis -right hip   Cataract    Depression    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   GERD (gastroesophageal reflux disease)    Hernia, incisional    after renal surgery   History of chicken pox    Hyperlipidemia    Hypertension    Renal cell carcinoma (HCC)    s/p right nephrectomy 1994   Sleep apnea    Thyroid  disease    Past Surgical History:  Procedure Laterality Date   APPENDECTOMY  1994   BIOPSY  11/07/2022   Procedure: BIOPSY;  Surgeon: Luke Salaam, MD;  Location: [facility] ENDOSCOPY;  Service: Gastroenterology;;   BREAST BIOPSY Right 1991   Negative   CHOLECYSTECTOMY  1994   COLONOSCOPY WITH PROPOFOL  N/A 11/07/2022   Procedure: COLONOSCOPY WITH PROPOFOL ;  Surgeon: Luke Salaam, MD;  Location: [facility] ENDOSCOPY;  Service: Gastroenterology;  Laterality: N/A;   ESOPHAGOGASTRODUODENOSCOPY (EGD) WITH PROPOFOL  N/A 11/07/2022   Procedure: ESOPHAGOGASTRODUODENOSCOPY (EGD) WITH PROPOFOL ;  Surgeon: Luke Salaam, MD;  Location: [facility] ENDOSCOPY;  Service: Gastroenterology;  Laterality: N/A;   JOINT REPLACEMENT  2019   LAPAROSCOPIC GASTRIC SLEEVE RESECTION  02/02/2016   Dr Ovid Blow at [facility]   NEPHRECTOMY  1994   Renal Cell  Carcinoma   parotid gland removal  1990   also removed a tumor   POLYPECTOMY  11/07/2022   Procedure: POLYPECTOMY INTESTINAL;  Surgeon: Luke Salaam, MD;  Location: [facility] ENDOSCOPY;  Service: Gastroenterology;;   TONSILLECTOMY     TOTAL HIP ARTHROPLASTY Right 08/08/2016   Procedure: TOTAL HIP ARTHROPLASTY;  Surgeon: Marlynn Singer, MD;  Location: [facility] ORS;  Service: Orthopedics;  Laterality: Right;   TUBAL LIGATION  1979   Patient Active Problem List   Diagnosis Date Noted   B12 deficiency 06/11/2022   Iron deficiency anemia 04/10/2022   Morbid obesity (HCC) 03/30/2022   Vitamin A  deficiency 08/15/2021   Parkinson's disease (HCC) 08/03/2021   Acute urinary retention 08/03/2021   Meningioma (HCC) 04/05/2021   Hypothyroidism 08/15/2020   Osteopenia 06/14/2020   Status post total hip replacement, 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   Osteoarthritis of right hip 03/26/2016   H/O gastric bypass 03/26/2016   Sinus tachycardia 08/01/2015   Obesity (BMI 30-39.9) 06/08/2015   OSA (obstructive sleep apnea) 06/08/2015   GERD without esophagitis 06/08/2015   Incisional hernia 06/08/2015   Arthritis of hip 03/23/2015   Left knee pain 02/15/2015   Vitamin D  deficiency 09/29/2014   Lump of right breast 09/22/2014   Prediabetes 09/22/2014   Pruritic dermatitis 09/22/2014  Obstructive sleep apnea 09/22/2014   Allergic rhinitis 09/15/2009   Hypersomnia 09/14/2009   Panic disorder 08/06/2008   Essential (primary) hypertension 07/16/2007   Hyperlipidemia 07/16/2007    ONSET DATE: Approximately 2 years ago  REFERRING DIAG: R26.89 (ICD-10-CM) - Balance problem   THERAPY DIAG:  Difficulty in walking, not elsewhere classified  Muscle weakness (generalized)  Repeated falls  Other lack of coordination  Dizziness and giddiness  Rationale for Evaluation and Treatment: Rehabilitation  SUBJECTIVE:                                                                                                                                                                                              SUBJECTIVE STATEMENT:    Pt's husband reports "everything takes about 100x longer than it used to take."   Pt states "I'm not very happy.Aaron AasAaron AasI don't feel like I'm in a good spot.Aaron AasAaron AasAutry Legions has been yelling at me all day." States "it is my fault, I can't seem to move quick enough."   Pt asks "could I stop coming" (referring to therapy)   Pt states she is have a problem after she eats she where she quickly needs to have a BM and she has difficulty getting to the bathroom due to her mobility decline.   Pt states "I want to come back to you.Aaron AasAaron AasI just need a little time off." (Meaning she wants to come back to therapy with this therapist)  Pt accompanied by: self and family member (husband, Autry Legions)   From visit on 07/30/23: Pt reports after last therapy session she has had a significant decline in her ability to walk. Reports she only made it a short distance out of the therapy clinic and then had to sit and use her rollator to be transported to the car. States at home since then, she has only been able to walk limited distances using rollator with supervision from her husband; otherwise, she is mostly sitting. Reports she feels like her legs are going to just go out from under her. Pt reports the decline is not associated with anything performed at last therapy session, but rather was just ironic timing. Pt states she had this sort of mobility decline happen years ago and was hospitalized with the finding that she had low potassium. Denies any falls since this started. States she hasn't been to the MD regarding this.   States she has been taking the Parkinson's medication for ~1 month today.   Reports she previously had a headache a couple of days ago, but that has gone away. Pt denies low back pain nor any other pain at this time.  Reports husband found out today that he is not eligible for the cancer treatment they were hoping  for.  Later in session, husband reports pt has not eaten anything all day and has overall had limited oral intake the past several days.  Pt accompanied by: self and family member (husband)  PERTINENT HISTORY: Pt is a 67y.o. female with concern for Parkinsonism due to resting tremors (R>L) with occasional whole-body jerks/myoclonus, bradykinesia, hallucinations while waking up, bilateral bradykinesia (L>R), minimal cogwheeling bilaterally, sensation of stiffness, speech changes, and imbalance (trialed Carbidopa-Levodopa, but intolerable due to side effects of severe nausea & vomiting). Pt with hx of frequent falls and bilateral LE weakness. Pt reports hx of kidney removal on R side with large abdominal incision and R hip replacement.  PAIN:  Are you having pain? No  PRECAUTIONS: Fall  RED FLAGS: None   WEIGHT BEARING RESTRICTIONS: No  FALLS: Has patient fallen in last 6 months? Yes. Number of falls 5-6 (2 in driveway, 1 on sidewalk) Most recent fall last Thursday 05/23/2023, pt reports she became "disoriented" and had LOB, but no injuries- pt states in general she is "very easily distracted and then she will misstep causing her to have LOB"  LIVING ENVIRONMENT: Lives with: lives with their spouse Autry Legions) Lives in: House/apartment Stairs: Yes: Internal: reports she doesn't have to go to different floors in house (4 steps inside); and External: 3-4 steps to enter with R HRs + courtyard + additional 4-5 steps onto porch with B HRs -  entrance has steep driveway Has following equipment at home: Otho Blitz - 4 wheeled, shower bench, grabbars in bathroom, bedrail, and Ramped entry from garage (but has to go up steep driveway)  PLOF: Independent with basic ADLs, Independent with household mobility with device, Independent with household mobility without device, and Requires assistive device for independence Reports started using rollator more consistently ~2-3weeks ago but has used it on/off for a  while  PATIENT GOALS: Wants to feel comfortable moving around and be shown how to do that again  OBJECTIVE:  Note: Objective measures were completed at Evaluation unless otherwise noted.  DIAGNOSTIC FINDINGS:   CT Head without Contrast 04/03/2021: Chronic atrophic changes without acute abnormality.   MRI Brain without Contrast 04/04/2021: 1. No acute intracranial abnormality. 2. Mild age-related cerebral atrophy with chronic small vessel ischemic disease. 3. 1.1 cm meningioma overlying the left frontal convexity without associated mass effect or edema.   CT Cervical Spine without Contrast 04/03/2021: Moderate severity multilevel degenerative changes, most prominent at the levels of C4-C5, C5-C6 and C6-C7. No evidence of an acute fracture or subluxation.   COGNITION: Overall cognitive status: Within functional limits for tasks assessed   SENSATION: WFL Reassessed on 07/30/2023: WNL, denies numbness/tingling in LEs  COORDINATION: WFL for basic functional mobility tasks  EDEMA:  None observed  MUSCLE TONE: WNL  MUSCLE LENGTH: Not tested  DTRs:  Not tested  POSTURE: rounded shoulders, forward head, and increased lumbar lordosis  LOWER EXTREMITY ROM:     Passive  All WFL for basic functional mobility tasks Right Eval Left Eval  Hip flexion    Hip extension    Hip abduction    Hip adduction    Hip internal rotation    Hip external rotation    Knee flexion    Knee extension    Ankle dorsiflexion    Ankle plantarflexion    Ankle inversion    Ankle eversion     (Blank rows = not tested)  LOWER  EXTREMITY MMT:    MMT Right Eval Left Eval 07/30/2023 R LE 07/30/2023 L LE  Hip flexion 3+ 4 3 3+  Hip extension      Hip abduction      Hip adduction      Hip internal rotation      Hip external rotation      Knee flexion 4- 4- 3+ 3+  Knee extension 4 4 4- 4-  Ankle dorsiflexion 4 4- 4 4-  Ankle plantarflexion 4- 4- 4- 4-  Ankle inversion      Ankle eversion       (Blank rows = not tested)  Manual Muscle Test Scale 0/5 = No muscle contraction can be seen or felt 1/5 = Contraction can be felt, but there is no motion 2-/5 = Part moves through incomplete ROM w/ gravity decreased 2/5 = Part moves through complete ROM w/ gravity decreased 2+/5 = Part moves through incomplete ROM (<50%) against gravity or through complete ROM w/ gravity 3-/5 = Part moves through incomplete ROM (>50%) against gravity 3/5 = Part moves through complete ROM against gravity 3+/5 = Part moves through complete ROM against gravity/slight resistance 4-/5= Holds test position against slight to moderate pressure 4/5 = Part moves through complete ROM against gravity/moderate resistance 4+/5= Holds test position against moderate to strong pressure 5/5 = Part moves through complete ROM against gravity/full resistance  BED MOBILITY:  Sit to supine Min A and requires used of bedrail Supine to sit Min A and requires used of bedrail Rolling to Right Min A Rolling to Left Min A Relies on bedrial, difficulty with lateral scooting in the bed (or in sitting)  TRANSFERS: Assistive device utilized: Environmental consultant - 4 wheeled  Sit to stand: SBA and CGA Stand to sit: SBA and CGA Chair to chair: SBA and CGA Without AD requires more consistent CGA/light min A Floor: not assessed  RAMP:  Assistive device utilized: Environmental consultant - 4 wheeled Ramp Comments: would benefit from assessment of this when able due to steep driveway at home  CURB:   Curb Comments: Not assessed  STAIRS: Level of Assistance: CGA Stair Negotiation Technique: Step to Pattern with Bilateral Rails ascending with R LE and descending with L LE Number of Stairs: 4  Height of Stairs: 6in  Comments: Pt reports feeling more confident when having B HR support  GAIT: Gait pattern: step through pattern, decreased step length- Right, decreased step length- Left, decreased stride length, shuffling, trendelenburg, lateral hip  instability, lateral lean- Right, lateral lean- Left, wide BOS, poor foot clearance- Right, and poor foot clearance- Left --- R/L lateral trunk lean over stance limb, decreased step lengths bilaterally, partial shuffled gait, wide BOS, increased balance instability Distance walked: ~42ft using HHA progressed to no UE support Assistive device utilized: HHA progressed to no UE support Level of assistance: CGA and Min A Comments: Slow gait speed  FUNCTIONAL TESTS:  5 times sit to stand: on 06/04/2023: 59.25 seconds Timed up and go (TUG): 24.90sec without AD, 22.25 with rollator 3 minute walk test: on 06/04/2023: 274ft using rollator = 80.54meters 10 meter walk test: 36.75 seconds = 0.6m/s without AD; 06/04/23 0.67m/s average gait speed during 3 minute walk test using rollator  Berg Balance Scale: on 06/04/2023 = 31/56 Would benefit from Modified CTSIB & FGA  PATIENT SURVEYS:  ABC scale 17.5% Details: patient 0% confident with majority of items; 50% confident household level ambulation, 40% stairs, 50% sweeping floor, 50% getting in/out of car, and 90% confident ambulating  up/down ramp with rails    Vestibular Assessment  - performed on 06/07/2023 - re-assessed on 2/25 with details in that treatment note  OCULOMOTOR EXAM:  Ocular Alignment: normal  Ocular ROM: No Limitations  Spontaneous Nystagmus: absent  Gaze-Induced Nystagmus: absent and continues to have difficulty keeping eyes on target, especially towards L  Smooth Pursuits: difficulty keeping eyes on target  Saccades: hypometric/undershoots, extra eye movements, and difficulty finding target on L side  Convergence/Divergence: not assessed Cover/Cross Cover: WNL  VESTIBULAR - OCULAR REFLEX:   VOR Cancellation: Unable to Maintain Gaze, inconsistent ability to keep eyes on target  Head-Impulse Test: HIT Right: negative HIT Left: negative -- had a difficult time keeping eyes on target in both directions  Dynamic Visual Acuity: would  benefit from future testing   POSITIONAL TESTING: Right Dix-Hallpike: upbeating, right nystagmus Left Dix-Hallpike: benefit from testing at next visit Right Roll Test: no nystagmus Left Roll Test: no nystagmus Left Sidelying: no nystagmus  MOTION SENSITIVITY:  May benefit from testing in future visits                                                                                                   TREATMENT DATE:     08/30/23   Patient arrives to therapy session in her personal manual wheelchair. Both patient and her husband report it is requiring significant and taxing effort for them to get out of the house due to patient's recent mobility decline. Therapist educated on recommendation to transition to HHPT at this time and reached out to MD regarding this.   Therapist also provided education to patient and husband on patient's recent falls and her increased fear of falling contributing to her mobility decline.   Therapist also reinforced encouragement on seeking psychiatry or counseling support during this time. Patient received referral for this support at last family medicine appointment on 08/27/2023 with referral sent to [facility].  Pt in agreement with plan to discontinue outpatient physical therapy at this time and receive referral for HHPT.     PATIENT EDUCATION:  Education details: PT POC, balance impairments and increased fall risk based on outcome measures, HEP Person educated: Patient and Spouse Education method: Explanation Education comprehension: verbalized understanding and needs further education  HOME EXERCISE PROGRAM:  Access Code: [email or web address] URL: [email or web address] Date: 07/23/2023 Prepared by: Carlen Chasten  Exercises - Walking  - 1 x daily - 7 x weekly - 3-5 sets - 3 minutes hold - Sit to Stand with Hands on Knees  - 1 x daily - 7 x weekly - 2 sets - 10 reps - Standing March with Counter Support   - 1 x daily - 7 x weekly - 2 sets - 10 reps - Supine Bridge  - 1 x daily - 7 x weekly - 2 sets - 10 reps    GOALS: Goals reviewed with patient? Yes  SHORT TERM GOALS: Target date: 10/08/2023   Patient will be independent in home exercise program to improve strength/mobility for better functional independence with ADLs. Baseline: provided on 06/04/2023 07/09/23: pt reports  performing, will need to progress 08/27/2023: pt with recent decline, not ambulating at home at this time Goal status: IN PROGRESS  2. Patient will improve ABC scale by 20% indicating increased confidence performing daily mobility tasks, which is associated with fall risk.    Baseline: 05/28/2023 = 17.5%;   07/09/23: need to re-assess; 3/28: 45.6% 08/27/2023: 44.375%    Goal Status: IN PROGRESS   LONG TERM GOALS: Target date: 11/19/2023   Patient (> 36 years old) will complete five times sit to stand test in < 15 seconds indicating an increased LE strength and improved balance. Baseline: 06/04/2023: 59.25 seconds 07/09/23: 20.27 seconds using UE support on armrests 3/28: unable to complete 5 sit to stands without UE support - stopped at 3 @  34.9 seconds  08/27/2023: need to assess Goal status: IN PROGRESS  Patient will increase Berg Balance score by > 6 points to demonstrate decreased fall risk during functional activities. Baseline: 06/04/2023: 31/56 07/09/23: 38/56 Goal Updated: Will increase Berg Balance score to >45/56 08/27/2023: need to assess Goal status: MET and Revised on 07/09/2023   Patient will reduce timed up and go to <11 seconds to reduce fall risk and demonstrate improved transfer/gait ability. Baseline: 05/28/2023 = 22.25sec using rollator vs 24.90 seconds without AD 07/09/23: 18.35 seconds using rollator and 19.07 sec without AD 08/27/2023: 35.48 seconds using rollator with close SBA/CGA  Goal status: IN PROGRESS  Patient will increase 10 meter walk test to >0.37m/s as to improve gait speed for better  community ambulation and to reduce fall risk. Baseline: 05/28/2023 = 0.58m/s 07/09/23: 0.59 m/s (16.90 seconds) using rollator & 0.43 m/s (22.89 seconds) without AD 08/27/2023: 0.212 m/s using rollator with close SBA/CGA for safety Goal status: IN PROGRESS  Patient will increase 3 minute walk test distance to >343ft for progression to limited community ambulation and improved gait ability Baseline: 06/04/2023 = 2103ft (80.22meters) using rollator 07/09/23: need to re-assess 3/28: 290' using rollator  08/27/2023: need to assess Goal status: IN PROGRESS   ASSESSMENT:  CLINICAL IMPRESSION:   Patient and husband report it is requiring a significant and taxing amount of effort to leave the house given patient's recent mobility decline. Therapist discussed with patient and husband about recommendation to transition to HHPT at this time, with pt in agreement. Therapist encouraged pt to follow-up on referral for psychiatry support. Plan to discharge from outpatient physical therapy at this time.  OBJECTIVE IMPAIRMENTS: Abnormal gait, cardiopulmonary status limiting activity, decreased activity tolerance, decreased balance, decreased endurance, decreased mobility, difficulty walking, and decreased strength.   ACTIVITY LIMITATIONS: carrying, lifting, bending, standing, squatting, stairs, transfers, bed mobility, reach over head, and locomotion level  PARTICIPATION LIMITATIONS: meal prep, cleaning, laundry, shopping, and community activity  PERSONAL FACTORS: Age, Behavior pattern, Fitness, Time since onset of injury/illness/exacerbation, and 3+ comorbidities: Parkinsonism, anxiety, arthritis, depression, HTN are also affecting patient's functional outcome.   REHAB POTENTIAL: Good  CLINICAL DECISION MAKING: Evolving/moderate complexity  EVALUATION COMPLEXITY: Moderate  PLAN:  PT FREQUENCY: 1-2x/week  PT DURATION: 12 weeks  PLANNED INTERVENTIONS: 97164- PT Re-evaluation, 97110-Therapeutic  exercises, 97530- Therapeutic activity, 97112- Neuromuscular re-education, 97535- Self Care, 13086- Manual therapy, [phone]- Gait training, [phone]- Canalith repositioning, Patient/Family education, Balance training, Stair training, Vestibular training, Visual/preceptual remediation/compensation, Cognitive remediation, and DME instructions   PLAN FOR NEXT SESSION:  Discharged    Carlen Chasten, PT, DPT, NCS, CSRS Physical Therapist - [place]  [facility]  11:13 AM 08/30/23

## 2023-08-30 ENCOUNTER — Ambulatory Visit: Payer: Medicare Other | Admitting: Physical Therapy

## 2023-08-30 ENCOUNTER — Encounter: Payer: Medicare Other | Admitting: Speech Pathology

## 2023-08-30 ENCOUNTER — Telehealth: Payer: Self-pay

## 2023-08-30 DIAGNOSIS — R262 Difficulty in walking, not elsewhere classified: Secondary | ICD-10-CM | POA: Diagnosis not present

## 2023-08-30 DIAGNOSIS — R278 Other lack of coordination: Secondary | ICD-10-CM

## 2023-08-30 DIAGNOSIS — M6281 Muscle weakness (generalized): Secondary | ICD-10-CM | POA: Diagnosis not present

## 2023-08-30 DIAGNOSIS — R42 Dizziness and giddiness: Secondary | ICD-10-CM

## 2023-08-30 DIAGNOSIS — R296 Repeated falls: Secondary | ICD-10-CM | POA: Diagnosis not present

## 2023-08-30 LAB — VITAMIN B1: Thiamine: 98.7 nmol/L (ref 66.5–200.0)

## 2023-08-30 LAB — VITAMIN D 25 HYDROXY (VIT D DEFICIENCY, FRACTURES): Vit D, 25-Hydroxy: 38.5 ng/mL (ref 30.0–100.0)

## 2023-08-30 NOTE — Progress Notes (Signed)
 Complex Care Management Note  Care Guide Note 08/30/2023 Name: Brittany Benton MRN: [id number] DOB: June 10, 1955  Brittany Benton is a 68 y.o. year old female who sees Fisher, Erlinda Haws, MD for primary care. I reached out to [name] by phone today to offer complex care management services.  [name] was given information about Complex Care Management services today including:   The Complex Care Management services include support from the care team which includes your Nurse Care Manager, Clinical Social Worker, or Pharmacist.  The Complex Care Management team is here to help remove barriers to the health concerns and goals most important to you. Complex Care Management services are voluntary, and the patient may decline or stop services at any time by request to their care team member.   Complex Care Management Consent Status: Patient agreed to services and verbal consent obtained.   Follow up plan:  Telephone appointment with complex care management team member scheduled for:  [facility] 09/05/2023 LCSW 09/23/2023  Encounter Outcome:  Patient Scheduled  Lenton Rail , RMA     [place]  [facility], [facility] Guide  Direct Dial: [phone]  Website: [email or web address]

## 2023-08-30 NOTE — Progress Notes (Signed)
 Care Guide Pharmacy Note  08/30/2023 Name: Brittany Benton MRN: [id number] DOB: December 02, 1955  Referred By: Lamon Pillow, MD Reason for referral: Complex Care Management (Outreach to schedule referral )   Brittany Benton is a 68 y.o. year old female who is a primary care patient of Lamon Pillow, MD.  [name] was referred to the pharmacist for assistance related to: Parkinsons   Successful contact was made with the patient to discuss pharmacy services including being ready for the pharmacist to call at least 5 minutes before the scheduled appointment time and to have medication bottles and any blood pressure readings ready for review. The patient agreed to meet with the pharmacist via telephone visit on (date/time).09/19/2023  Lenton Rail , RMA     [place]  [facility], [facility] Guide  Direct Dial: [phone]  Website: [email or web address]

## 2023-09-02 ENCOUNTER — Ambulatory Visit: Payer: Medicare Other | Admitting: Physical Therapy

## 2023-09-02 ENCOUNTER — Encounter: Payer: Medicare Other | Admitting: Speech Pathology

## 2023-09-03 ENCOUNTER — Ambulatory Visit: Payer: Self-pay | Admitting: Family Medicine

## 2023-09-04 ENCOUNTER — Ambulatory Visit: Payer: Medicare Other | Admitting: Physical Therapy

## 2023-09-04 ENCOUNTER — Encounter: Payer: Medicare Other | Admitting: Speech Pathology

## 2023-09-05 ENCOUNTER — Other Ambulatory Visit: Payer: Self-pay

## 2023-09-05 DIAGNOSIS — G319 Degenerative disease of nervous system, unspecified: Secondary | ICD-10-CM | POA: Diagnosis not present

## 2023-09-05 DIAGNOSIS — E039 Hypothyroidism, unspecified: Secondary | ICD-10-CM | POA: Diagnosis not present

## 2023-09-05 DIAGNOSIS — M1990 Unspecified osteoarthritis, unspecified site: Secondary | ICD-10-CM | POA: Diagnosis not present

## 2023-09-05 DIAGNOSIS — E440 Moderate protein-calorie malnutrition: Secondary | ICD-10-CM | POA: Diagnosis not present

## 2023-09-05 DIAGNOSIS — M50123 Cervical disc disorder at C6-C7 level with radiculopathy: Secondary | ICD-10-CM | POA: Diagnosis not present

## 2023-09-05 DIAGNOSIS — M6281 Muscle weakness (generalized): Secondary | ICD-10-CM | POA: Diagnosis not present

## 2023-09-05 DIAGNOSIS — L308 Other specified dermatitis: Secondary | ICD-10-CM | POA: Diagnosis not present

## 2023-09-05 DIAGNOSIS — D509 Iron deficiency anemia, unspecified: Secondary | ICD-10-CM | POA: Diagnosis not present

## 2023-09-05 DIAGNOSIS — K432 Incisional hernia without obstruction or gangrene: Secondary | ICD-10-CM | POA: Diagnosis not present

## 2023-09-05 DIAGNOSIS — M47816 Spondylosis without myelopathy or radiculopathy, lumbar region: Secondary | ICD-10-CM | POA: Diagnosis not present

## 2023-09-05 DIAGNOSIS — G4721 Circadian rhythm sleep disorder, delayed sleep phase type: Secondary | ICD-10-CM | POA: Diagnosis not present

## 2023-09-05 DIAGNOSIS — K219 Gastro-esophageal reflux disease without esophagitis: Secondary | ICD-10-CM | POA: Diagnosis not present

## 2023-09-05 DIAGNOSIS — M48061 Spinal stenosis, lumbar region without neurogenic claudication: Secondary | ICD-10-CM | POA: Diagnosis not present

## 2023-09-05 DIAGNOSIS — R451 Restlessness and agitation: Secondary | ICD-10-CM | POA: Diagnosis not present

## 2023-09-05 DIAGNOSIS — I10 Essential (primary) hypertension: Secondary | ICD-10-CM | POA: Diagnosis not present

## 2023-09-05 DIAGNOSIS — E538 Deficiency of other specified B group vitamins: Secondary | ICD-10-CM | POA: Diagnosis not present

## 2023-09-05 DIAGNOSIS — M8580 Other specified disorders of bone density and structure, unspecified site: Secondary | ICD-10-CM | POA: Diagnosis not present

## 2023-09-05 DIAGNOSIS — N6310 Unspecified lump in the right breast, unspecified quadrant: Secondary | ICD-10-CM | POA: Diagnosis not present

## 2023-09-05 DIAGNOSIS — G4733 Obstructive sleep apnea (adult) (pediatric): Secondary | ICD-10-CM | POA: Diagnosis not present

## 2023-09-05 DIAGNOSIS — E11620 Type 2 diabetes mellitus with diabetic dermatitis: Secondary | ICD-10-CM | POA: Diagnosis not present

## 2023-09-05 DIAGNOSIS — G20C Parkinsonism, unspecified: Secondary | ICD-10-CM | POA: Diagnosis not present

## 2023-09-05 NOTE — Patient Outreach (Signed)
 Complex Care Management   Visit Note  09/05/2023  Name:  Brittany Benton MRN: [id number] DOB: 04/05/1956  Situation: Referral received for Complex Care Management related to Parkinson's Disease I obtained verbal consent from Patient.  Visit completed with Itali Baumgartner  on the phone. Initial assessment incomplete due to time constraints. Will complete at follow-up visit.  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   GERD (gastroesophageal reflux disease)    Hernia, incisional    after renal surgery   History of chicken pox    Hyperlipidemia    Hypertension    Renal cell carcinoma (HCC)    s/p right nephrectomy 1994   Sleep apnea    Thyroid  disease     Assessment: Patient Reported Symptoms:  Cognitive Cognitive Status: Able to follow simple commands, Alert and oriented to person, place, and time, Normal speech and language skills, Struggling with memory recall Cognitive/Intellectual Conditions Management [RPT]: None reported or documented in medical history or problem list      Neurological Neurological Review of Symptoms: Weakness, Dizziness (Dizziness with position changes) Neurological Conditions: Parkinson's disease Neurological Management Strategies: Medication therapy, Adequate rest, Exercise, Routine screening (Regular visits with neurologist at [facility])  HEENT HEENT Symptoms Reported: Dryness (Dry mouth) HEENT Management Strategies: Coping strategies (Patient has mouth rinse for dry mouth but has not tried it yet. Has tried lemon drops and peppermint as advised by neurology. Reports increasing water intake.)    Cardiovascular Cardiovascular Symptoms Reported: Dizziness (Dizziness with position changes) Does patient have uncontrolled Hypertension?: No Cardiovascular Conditions: Hypertension, High blood  cholesterol (Iron deficiency anemia) Cardiovascular Management Strategies: Medication therapy  Respiratory Respiratory Symptoms Reported: No symptoms reported Respiratory Conditions: Sleep disordered breathing (Patient reports she has not needed CPAP in years since losing weight)  Endocrine Patient reports the following symptoms related to hypoglycemia or hyperglycemia : No symptoms reported Is patient diabetic?: No Endocrine Conditions: Other Other Endocrine Conditions: Prediabetes  Gastrointestinal Gastrointestinal Symptoms Reported: Change in appetite Additional Gastrointestinal Details: Patient reports her appetite is poor. She can go 2-3 days without eating. When she does eat, she has small portions. Patient has discussed this with neurologist who contributes this to her dry mouth. Patient is asking if there are medications that can increase saliva. Gastrointestinal Conditions: Reflux/heartburn Nutrition Risk Screen (CP): Unintentional loss of 10 lbs or more in the past 2 months (Weight loss of 30 lbs since January)  Genitourinary Genitourinary Symptoms Reported: Urgency Genitourinary Management Strategies: Incontinence garment/pad (Patient reports wearing briefs for extra protection, but typically is able to make it to the bathroom in time)  Integumentary Integumentary Symptoms Reported: No symptoms reported    Musculoskeletal Musculoskelatal Symptoms Reviewed: Weakness, Difficulty walking, Unsteady gait, Other (Due to Parkinson's Disease) Other Musculoskeletal Symptoms: Tremors Musculoskeletal Conditions: Osteoarthritis, Osteopenia, Mobility limited Musculoskeletal Management Strategies: Exercise, Medical device, Adequate rest Musculoskeletal Comment: Patient reports that she has been getting outpatient PT for the past 3 months and has been doing really well, but she quit going because she didn't always have transportation. Patient reports that they are ordering HH PT instead, to be  started this afternoon. She has about 5 sessions left. Falls in the past year?: Yes Number of falls in past year: 2 or more (About 20 per patient) Was there an injury with  Fall?: No Fall Risk Category Calculator: 2 Patient Fall Risk Level: Moderate Fall Risk Patient at Risk for Falls Due to: Impaired balance/gait, Impaired mobility, History of fall(s) Fall risk Follow up: Falls evaluation completed, Education provided, Falls prevention discussed (Patient and her husband carry walkie-talkies so she can reach him if she has had a fall. If he is not home, she carries her phone so that she can call him or her sister who lives in [place]. She reports she is getting a life alert.)  Psychosocial Psychosocial Symptoms Reported: Depression - if selected complete PHQ 2-9 Behavioral Health Comment: Patient reports that she has had counseling prior. Note that ambulatory referral to psych was sent following office visit 08/27/23. Patient reports they have not heard to schedule appointment yet. Major Change/Loss/Stressor/Fears (CP): Medical condition, self, Relationship concerns Behaviors When Feeling Stressed/Fearful: Patient becomes tearful on the phone when discussing her relationship concerns with her family and her phsyical limitations due to chronic illness. She reports she does not have coping strategies to turn to when feeling sad or stressed. Quality of Family Relationships: helpful, involved, supportive (Husband, sisters) Do you feel physically threatened by others?: No      09/05/2023    1:37 PM  Depression screen PHQ 2/9  Decreased Interest 3  Down, Depressed, Hopeless 1  PHQ - 2 Score 4  Altered sleeping 0  Tired, decreased energy 1  Change in appetite 3  Feeling bad or failure about yourself  2  Trouble concentrating 0  Moving slowly or fidgety/restless 0  Suicidal thoughts 0  PHQ-9 Score 10  Difficult doing work/chores Very difficult    There were no vitals filed for this  visit.  Medications Reviewed Today   Medications were not reviewed in this encounter     Recommendation:   Specialty provider follow-up neurology as scheduled Pharmacist appointment 09/19/23 LCSW appointment 09/23/23 Patient is waiting to hear from psych regarding recent referral Continue to work with PT to increase strength and mobility  Follow Up Plan:   Telephone follow up appointment date/time:  09/06/23 at 1:30 PM to complete initial visit  Theodora Fish, RN MSN [place]  [facility] RN Care Manager Direct Dial: [phone]  Fax: [phone]

## 2023-09-05 NOTE — Patient Instructions (Signed)
 Visit Information  Thank you for taking time to visit with me today. Please don't hesitate to contact me if I can be of assistance to you before our next scheduled appointment.  Our next appointment is by telephone on 09/06/23 at 1:30 PM Please call the care guide team at [phone] if you need to cancel or reschedule your appointment.   Following is a copy of your care plan:   Goals Addressed             This Visit's Progress    [facility]BCI RN Care Plan for Parkinson's Disease   On track    Problems:  Chronic Disease Management support and education needs related to Parkinson's disease, high fall risk  Goal: Over the next 30 days the Patient will demonstrate Improved health management independence as evidenced by making efforts to increase nutritional intake through tips provided by neurologist        take all medications exactly as prescribed and will call provider for medication related questions as evidenced by medication adherence    work with psych to address depression as evidenced by review of electronic medical record and patient or care team member report   Continue to work with physical therapy to increase strength and mobility  Interventions:   Falls Interventions: Assessed for signs and symptoms of orthostatic hypotension Screening for signs and symptoms of depression related to chronic disease state  Assessed for falls Discussed obtaining life alert system   Evaluation of current treatment plan related to Parkinson's disease,  self-management and patient's adherence to plan as established by provider. Discussed plans with patient for ongoing care management follow up and provided patient with direct contact information for care management team Evaluation of current treatment plan related to Parkinson's Disease and patient's adherence to plan as established by provider Reviewed scheduled/upcoming provider appointments including pharmacist 09/19/23 and LCSW 09/23/23  Patient  Self-Care Activities:  Attend all scheduled provider appointments Call provider office for new concerns or questions  Take medications as prescribed   Work with the social worker to address care coordination needs and will continue to work with the clinical team to address health care and disease management related needs Work with the pharmacist to address medication management needs and will continue to work with the clinical team to address health care and disease management related needs  Plan:  Telephone follow up appointment with care management team member scheduled for:  09/06/23 at 1:30 PM to complete initial assessment             Please call the Suicide and Crisis Lifeline: 988 call 1-800-273-TALK (toll free, 24 hour hotline) if you are experiencing a Mental Health or Behavioral Health Crisis or need someone to talk to.  Patient verbalizes understanding of instructions and care plan provided today and agrees to view in MyChart. Active MyChart status and patient understanding of how to access instructions and care plan via MyChart confirmed with patient.     Theodora Fish, RN MSN [place]  [facility] Population [facility] RN Care Manager Direct Dial: [phone]  Fax: [phone]

## 2023-09-06 ENCOUNTER — Other Ambulatory Visit: Payer: Self-pay

## 2023-09-06 NOTE — Patient Instructions (Signed)
 Visit Information  Thank you for taking time to visit with me today. Please don't hesitate to contact me if I can be of assistance to you before our next scheduled appointment.  Your next care management appointment is by telephone on 09/20/23 at 10 AM   Please call the care guide team at [phone] if you need to cancel, schedule, or reschedule an appointment.   Please call the Suicide and Crisis Lifeline: 988 call 1-800-273-TALK (toll free, 24 hour hotline) if you are experiencing a Mental Health or Behavioral Health Crisis or need someone to talk to.  Theodora Fish, RN MSN [place]  [facility] Population Health RN Care Manager Direct Dial: [phone]  Fax: [phone]

## 2023-09-06 NOTE — Patient Outreach (Signed)
 Complex Care Management   Visit Note  09/06/2023  Name:  [name] MRN: [id number] DOB: 1955-04-30  Situation: Referral received for Complex Care Management related to Parkinson's Disease I obtained verbal consent from Patient.  Visit completed with Brittany Benton  on the phone to complete initial CCM assessment/med rec.  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   GERD (gastroesophageal reflux disease)    Hernia, incisional    after renal surgery   History of chicken pox    Hyperlipidemia    Hypertension    Renal cell carcinoma (HCC)    s/p right nephrectomy 1994   Sleep apnea    Thyroid  disease     Assessment: Patient Reported Symptoms:  Cognitive        Neurological Neurological Review of Symptoms: Not assessed (Assessed during visit on 09/05/23)    HEENT HEENT Symptoms Reported: Not assessed (Assessed during visit on 09/05/23)      Cardiovascular Cardiovascular Symptoms Reported: Not assessed (Assessed during visit on 09/05/23)    Respiratory Respiratory Symptoms Reported: Not assesed (Assessed during visit on 09/05/23)    Endocrine Patient reports the following symptoms related to hypoglycemia or hyperglycemia : Not assessed (Assessed during visit on 09/05/23)    Gastrointestinal Gastrointestinal Symptoms Reported: Not assessed (Assessed during visit on 09/05/23)      Genitourinary Genitourinary Symptoms Reported: Not assessed (Assessed during visit on 09/05/23)    Integumentary Integumentary Symptoms Reported: Not assessed (Assessed during visit on 09/05/23)    Musculoskeletal Musculoskelatal Symptoms Reviewed:  (Assessed during visit on 09/05/23)        Psychosocial Psychosocial Symptoms Reported: Anxiety - if selected complete GAD            09/05/2023    1:37 PM  Depression screen PHQ 2/9   Decreased Interest 3  Down, Depressed, Hopeless 1  PHQ - 2 Score 4  Altered sleeping 0  Tired, decreased energy 1  Change in appetite 3  Feeling bad or failure about yourself  2  Trouble concentrating 0  Moving slowly or fidgety/restless 0  Suicidal thoughts 0  PHQ-9 Score 10  Difficult doing work/chores Very difficult    There were no vitals filed for this visit.  Medications Reviewed Today     Reviewed by Brittany Garland, RN (Registered Nurse) on 09/06/23 at 1332  Med List Status: <None>   Medication Order Taking? Sig Documenting Provider Last Dose Status Informant  ALPRAZolam  (XANAX ) 0.25 MG tablet [id number]  Take 1 tablet (0.25 mg total) by mouth 2 (two) times daily as needed for anxiety (agitation). Brittany Charter, DO  Active   Ascorbic Acid (VITAMIN C PO) [id number]  Take by mouth. [provider]  Active   atorvastatin  (LIPITOR) 20 MG tablet [id number]  TAKE 1 TABLET BY MOUTH EVERY EVENING Brittany Pillow, MD  Active   Cholecalciferol  (VITAMIN D -3) 1000 units CAPS [id number]  Take 1 capsule by mouth daily. [provider]  Active Self  imipramine  (TOFRANIL ) 50 MG tablet [id number]  TAKE ONE TABLET BY MOUTH TWICE DAILY Brittany Pillow, MD  Active   ketoconazole  (NIZORAL ) 2 % cream [id number]  Apply 1 Application topically daily. Until rash resolves completely. Brittany Charter, DO  Active   levothyroxine  (SYNTHROID ) 88 MCG tablet [id number]  Take 0.5 tablets (44 mcg total) by mouth daily. Brittany Charter, DO  Active   metoprolol  succinate (TOPROL -XL) 50 MG 24 hr tablet [id number]  TAKE ONE TABLET BY MOUTH EVERY DAY WITH OR IMMEDIATELY FOLLOWING A MEAL Benton, Brittany Haws, MD  Active   Multiple Vitamins-Minerals (MULTIVITAMIN ADULT PO) [id number]  Take 1 tablet by mouth daily. [provider]  Active Self           Med Note Brittany Benton, Alberteen Benton   Fri Jul 20, 2016 10:17 AM)    omeprazole  (PRILOSEC OTC) 20 MG tablet [id number]  Take 20 mg by mouth daily.  [provider]  Active   omeprazole  (PRILOSEC) 20 MG capsule [id number]  TAKE 1 CAPSULE BY MOUTH ONCE DAILY Brittany Pillow, MD  Active   sertraline  (ZOLOFT ) 100 MG tablet [id number]  TAKE ONE TABLET BY MOUTH DAILY Brittany Pillow, MD  Active             Recommendation:   Specialty provider follow-up neurology as scheduled Pharmacist appointment 09/19/23 LCSW appointment 09/23/23 Patient is waiting to hear from psych regarding recent referral Continue to work with PT to increase strength and mobility  Follow Up Plan:   Telephone follow up appointment date/time:  09/20/23 at 10 AM with Brittany Cure, RN MSN [place]  [facility] RN Care Manager Direct Dial: [phone]  Fax: [phone]

## 2023-09-07 ENCOUNTER — Other Ambulatory Visit: Payer: Self-pay | Admitting: Family Medicine

## 2023-09-07 DIAGNOSIS — E785 Hyperlipidemia, unspecified: Secondary | ICD-10-CM

## 2023-09-10 ENCOUNTER — Ambulatory Visit: Payer: Medicare Other | Admitting: Physical Therapy

## 2023-09-10 ENCOUNTER — Encounter: Payer: Medicare Other | Admitting: Speech Pathology

## 2023-09-11 ENCOUNTER — Ambulatory Visit: Payer: Self-pay

## 2023-09-11 DIAGNOSIS — M6281 Muscle weakness (generalized): Secondary | ICD-10-CM | POA: Diagnosis not present

## 2023-09-11 DIAGNOSIS — E11620 Type 2 diabetes mellitus with diabetic dermatitis: Secondary | ICD-10-CM | POA: Diagnosis not present

## 2023-09-11 DIAGNOSIS — E440 Moderate protein-calorie malnutrition: Secondary | ICD-10-CM | POA: Diagnosis not present

## 2023-09-11 DIAGNOSIS — M48061 Spinal stenosis, lumbar region without neurogenic claudication: Secondary | ICD-10-CM | POA: Diagnosis not present

## 2023-09-11 DIAGNOSIS — M50123 Cervical disc disorder at C6-C7 level with radiculopathy: Secondary | ICD-10-CM | POA: Diagnosis not present

## 2023-09-11 DIAGNOSIS — M1990 Unspecified osteoarthritis, unspecified site: Secondary | ICD-10-CM | POA: Diagnosis not present

## 2023-09-11 DIAGNOSIS — R451 Restlessness and agitation: Secondary | ICD-10-CM | POA: Diagnosis not present

## 2023-09-11 DIAGNOSIS — G4721 Circadian rhythm sleep disorder, delayed sleep phase type: Secondary | ICD-10-CM | POA: Diagnosis not present

## 2023-09-11 DIAGNOSIS — G20C Parkinsonism, unspecified: Secondary | ICD-10-CM | POA: Diagnosis not present

## 2023-09-11 DIAGNOSIS — E039 Hypothyroidism, unspecified: Secondary | ICD-10-CM | POA: Diagnosis not present

## 2023-09-11 DIAGNOSIS — G319 Degenerative disease of nervous system, unspecified: Secondary | ICD-10-CM | POA: Diagnosis not present

## 2023-09-11 DIAGNOSIS — G4733 Obstructive sleep apnea (adult) (pediatric): Secondary | ICD-10-CM | POA: Diagnosis not present

## 2023-09-11 DIAGNOSIS — M8580 Other specified disorders of bone density and structure, unspecified site: Secondary | ICD-10-CM | POA: Diagnosis not present

## 2023-09-11 DIAGNOSIS — M47816 Spondylosis without myelopathy or radiculopathy, lumbar region: Secondary | ICD-10-CM | POA: Diagnosis not present

## 2023-09-11 DIAGNOSIS — I10 Essential (primary) hypertension: Secondary | ICD-10-CM | POA: Diagnosis not present

## 2023-09-11 DIAGNOSIS — N6310 Unspecified lump in the right breast, unspecified quadrant: Secondary | ICD-10-CM | POA: Diagnosis not present

## 2023-09-11 DIAGNOSIS — E538 Deficiency of other specified B group vitamins: Secondary | ICD-10-CM | POA: Diagnosis not present

## 2023-09-11 DIAGNOSIS — K432 Incisional hernia without obstruction or gangrene: Secondary | ICD-10-CM | POA: Diagnosis not present

## 2023-09-11 DIAGNOSIS — D509 Iron deficiency anemia, unspecified: Secondary | ICD-10-CM | POA: Diagnosis not present

## 2023-09-11 DIAGNOSIS — K219 Gastro-esophageal reflux disease without esophagitis: Secondary | ICD-10-CM | POA: Diagnosis not present

## 2023-09-11 DIAGNOSIS — L308 Other specified dermatitis: Secondary | ICD-10-CM | POA: Diagnosis not present

## 2023-09-11 NOTE — Telephone Encounter (Signed)
 Copied from CRM [id number]. Topic: Clinical - Red Word Triage >> Sep 11, 2023  3:38 PM DeAngela L wrote: Red Word that prompted transfer to Nurse Triage: Patient has a spot on her body on her right back under the arm pit and its very warm to touch and also painful Patient noticed it Monday and it has gotten worse since Monday  Chief Complaint: Red spot/area Symptoms: Redness, painful to touch, moist Frequency: 2 days Pertinent Negatives: Patient denies fever Disposition: [] ED /[] Urgent Care (no appt availability in office) / [x] Appointment(In office/virtual)/ []  [place] [facility]/ [] Home Care/ [] Refused Recommended Disposition /[] [place] [facility]/ []  Follow-up with PCP Additional Notes: Patient called in to report a red, raised spot on her back/under her right arm. Patient stated the spot has  been present for 2 days. Patient stated the area is about 8 inches long. Patient stated area is painful and moist to the touch. Patient denied fever, discharge and cold/flu symptoms. Advised patient to be seen within 4 hours, per protocol. No availability in office. Advised UC. Patient declined. Patient needs an appointment, please advise. Provided care advice and instructed patient to call back if symptoms worsen. Patient complied.   Reason for Disposition  [1] Swelling is red AND [2] fever  Answer Assessment - Initial Assessment Questions 1. APPEARANCE of SWELLING: "What does it look like?"     "Raised here and there"/"little ripple" 2. SIZE: "How large is the swelling?" (e.g., inches, cm; or compare to size of pinhead, tip of pen, eraser, coin, pea, grape, ping pong ball)      8 inches 3. LOCATION: "Where is the swelling located?"     Right back/upper arm 4. ONSET: "When did the swelling start?"     2 days 5. COLOR: "What color is it?" "Is there more than one color?"     Red 6. PAIN: "Is there any pain?" If Yes, ask: "How bad is the pain?" (e.g., scale 1-10; or mild, moderate, severe)      - NONE (0): no pain   - MILD (1-3): doesn't interfere with normal activities    - MODERATE (4-7): interferes with normal activities or awakens from sleep    - SEVERE (8-10): excruciating pain, unable to do any normal activities     Is not painful at rest, rates pain a 5 upon touching area  7. ITCH: "Does it itch?" If Yes, ask: "How bad is the itch?"      Denies 8. CAUSE: "What do you think caused the swelling?"     Unknown 9 OTHER SYMPTOMS: "Do you have any other symptoms?" (e.g., fever)     Area is moist, but denied discharge, denies fever, denies cold/flu symptoms  Protocols used: Skin Lump or Localized Swelling-A-AH

## 2023-09-12 ENCOUNTER — Encounter: Payer: Medicare Other | Admitting: Speech Pathology

## 2023-09-12 ENCOUNTER — Ambulatory Visit: Admitting: Physician Assistant

## 2023-09-12 ENCOUNTER — Ambulatory Visit: Payer: Medicare Other | Admitting: Physical Therapy

## 2023-09-12 ENCOUNTER — Encounter: Payer: Self-pay | Admitting: Physician Assistant

## 2023-09-12 VITALS — BP 88/61 | HR 82 | Resp 16 | Ht 60.0 in | Wt 170.0 lb

## 2023-09-12 DIAGNOSIS — L304 Erythema intertrigo: Secondary | ICD-10-CM

## 2023-09-12 DIAGNOSIS — F419 Anxiety disorder, unspecified: Secondary | ICD-10-CM | POA: Diagnosis not present

## 2023-09-12 MED ORDER — KETOCONAZOLE 2 % EX CREA: 1.0000 | TOPICAL_CREAM | Freq: Every day | CUTANEOUS | 0 refills | Status: DC

## 2023-09-12 MED ORDER — FLUCONAZOLE 150 MG PO TABS: 150.0000 mg | ORAL_TABLET | Freq: Once | ORAL | 0 refills | Status: AC

## 2023-09-12 NOTE — Progress Notes (Signed)
 Established patient visit  Patient: Brittany Benton   DOB: 1955/09/11   68 y.o. Female  MRN: [id number] Visit Date: 09/12/2023  Today's healthcare provider: Blane Bunting, PA-C   Chief Complaint  Patient presents with   Rash  Subjective      Discussed the use of AI scribe software for clinical note transcription with the patient, who gave verbal consent to proceed.  History of Present Illness Brittany Benton is a 68 year old female who presents with a painful rash on her side.  She has had a painful rash on her side for two days, with a slight odor and spreading since the previous day. Two weeks ago, she had a similar rash on her right foot, diagnosed as tinea and treated with ketoconazole , which improved. She denies recent antibiotic use and has not had high blood sugar in five years, with normal levels in April. She experiences chronic shortness of breath and describes a 'heretic heartbeat'. No fever is present. Her medications include imipramine  and sertraline  for anxiety, and she started Xanax  two weeks ago for anxiety and anger, which she finds helpful.       09/05/2023    1:37 PM 11/14/2022   10:23 AM 03/30/2022    8:22 AM  Depression screen PHQ 2/9  Decreased Interest 3 0 0  Down, Depressed, Hopeless 1 0 0  PHQ - 2 Score 4 0 0  Altered sleeping 0  3  Tired, decreased energy 1  3  Change in appetite 3  3  Feeling bad or failure about yourself  2  0  Trouble concentrating 0  1  Moving slowly or fidgety/restless 0  1  Suicidal thoughts 0  0  PHQ-9 Score 10  11  Difficult doing work/chores Very difficult  Not difficult at all      09/06/2023    1:46 PM  GAD 7 : Generalized Anxiety Score  Nervous, Anxious, on Edge 3  Control/stop worrying 0  Worry too much - different things 0  Trouble relaxing 0  Restless 0  Easily annoyed or irritable 3  Afraid - awful might happen 0  Total GAD 7 Score 6  Anxiety Difficulty Not difficult at all    Medications: Outpatient  Medications Prior to Visit  Medication Sig   ALPRAZolam  (XANAX ) 0.25 MG tablet Take 1 tablet (0.25 mg total) by mouth 2 (two) times daily as needed for anxiety (agitation).   Ascorbic Acid (VITAMIN C PO) Take by mouth.   atorvastatin  (LIPITOR) 20 MG tablet TAKE 1 TABLET BY MOUTH EVERY EVENING   Carbidopa-Levodopa ER (SINEMET CR) 25-100 MG tablet controlled release Take 1 tablet by mouth 2 (two) times daily.   Cholecalciferol  (VITAMIN D -3) 1000 units CAPS Take 1 capsule by mouth daily.   imipramine  (TOFRANIL ) 50 MG tablet TAKE ONE TABLET BY MOUTH TWICE DAILY   ketoconazole  (NIZORAL ) 2 % cream Apply 1 Application topically daily. Until rash resolves completely.   levothyroxine  (SYNTHROID ) 88 MCG tablet Take 0.5 tablets (44 mcg total) by mouth daily.   metoprolol  succinate (TOPROL -XL) 50 MG 24 hr tablet TAKE ONE TABLET BY MOUTH EVERY DAY WITH OR IMMEDIATELY FOLLOWING A MEAL   Multiple Vitamins-Minerals (MULTIVITAMIN ADULT PO) Take 1 tablet by mouth daily. (Patient not taking: Reported on 09/06/2023)   omeprazole  (PRILOSEC OTC) 20 MG tablet Take 20 mg by mouth daily.   omeprazole  (PRILOSEC) 20 MG capsule TAKE 1 CAPSULE BY MOUTH ONCE DAILY   sertraline  (ZOLOFT ) 100 MG tablet TAKE ONE TABLET  BY MOUTH DAILY   No facility-administered medications prior to visit.    Review of Systems All negative Except see HPI       Objective    Resp 16   Ht 5' (1.524 m)   Wt 170 lb (77.1 kg)   BMI 33.20 kg/m     Physical Exam Vitals reviewed.  Constitutional:      General: She is not in acute distress.    Appearance: Normal appearance. She is well-developed. She is not diaphoretic.  HENT:     Head: Normocephalic and atraumatic.  Eyes:     General: No scleral icterus.    Conjunctiva/sclera: Conjunctivae normal.  Neck:     Thyroid : No thyromegaly.  Cardiovascular:     Rate and Rhythm: Normal rate and regular rhythm.     Pulses: Normal pulses.     Heart sounds: Normal heart sounds. No murmur  heard. Pulmonary:     Effort: Pulmonary effort is normal. No respiratory distress.     Breath sounds: Normal breath sounds. No wheezing, rhonchi or rales.  Musculoskeletal:     Cervical back: Neck supple.     Right lower leg: No edema.     Left lower leg: No edema.  Lymphadenopathy:     Cervical: No cervical adenopathy.  Skin:    General: Skin is warm and dry.     Findings: Rash (moist, erythematous , shiny patcg in the right inframmaary forld) present.  Neurological:     Mental Status: She is alert and oriented to person, place, and time. Mental status is at baseline.  Psychiatric:        Mood and Affect: Mood normal.        Behavior: Behavior normal.      No results found for any visits on 09/12/23.      Assessment and Plan Assessment & Plan intertrigo Suspected candidainfection No recent antibiotic use or diabetes. - Prescribed oral antifungal for 7-14 days. - Prescribed topical antifungal cream similar to ketoconazole . - Advised keeping area clean, dry, separated with cotton fabric. - Sent prescriptions to local pharmacy.   Anxiety disorder Anxiety and anger previously managed with Xanax , imipramine , and sertraline . Xanax  prescribed last week, not eligible for refill. - Advised follow-up with Doctor Shann Darnel or another provider for Xanax  refill when eligible.   No orders of the defined types were placed in this encounter.   No follow-ups on file.   The patient was advised to call back or seek an in-person evaluation if the symptoms worsen or if the condition fails to improve as anticipated.  I discussed the assessment and treatment plan with the patient. The patient was provided an opportunity to ask questions and all were answered. The patient agreed with the plan and demonstrated an understanding of the instructions.  I, Alias Villagran, PA-C have reviewed all documentation for this visit. The documentation on 09/12/2023  for the exam, diagnosis, procedures, and  orders are all accurate and complete.  Blane Bunting, [facility], MMS [facility] [phone] (phone) [phone] (fax)  [facility]

## 2023-09-12 NOTE — Telephone Encounter (Signed)
 Appt made for 09/12/23 at 2:20 with [name], PA

## 2023-09-16 ENCOUNTER — Other Ambulatory Visit: Payer: Self-pay | Admitting: Family Medicine

## 2023-09-16 ENCOUNTER — Telehealth: Payer: Self-pay

## 2023-09-16 DIAGNOSIS — F410 Panic disorder [episodic paroxysmal anxiety] without agoraphobia: Secondary | ICD-10-CM

## 2023-09-16 NOTE — Telephone Encounter (Signed)
 Copied from CRM [id number]. Topic: General - Other >> Sep 16, 2023  7:59 AM Emylou G wrote: Reason for CRM: Valinda Gault w/[facility]aron Aas rcvd script for: Take 1 tablet (150 mg total) by mouth once for 1 dose., Starting Thu 09/12/2023, Normal Dispense: 14 tablet  But need clarity on instructions - seems inaccurate.Aaron Aas Pls call them back [phone]

## 2023-09-16 NOTE — Telephone Encounter (Signed)
 Brad from the pharmacy called back to get a status. Please follow up.

## 2023-09-17 ENCOUNTER — Ambulatory Visit: Payer: Medicare Other | Admitting: Physical Therapy

## 2023-09-17 ENCOUNTER — Encounter: Payer: Medicare Other | Admitting: Speech Pathology

## 2023-09-17 DIAGNOSIS — G4733 Obstructive sleep apnea (adult) (pediatric): Secondary | ICD-10-CM | POA: Diagnosis not present

## 2023-09-17 DIAGNOSIS — G20C Parkinsonism, unspecified: Secondary | ICD-10-CM | POA: Diagnosis not present

## 2023-09-17 DIAGNOSIS — R451 Restlessness and agitation: Secondary | ICD-10-CM | POA: Diagnosis not present

## 2023-09-17 NOTE — Telephone Encounter (Signed)
 Pharmacy reports signature was provided this morning

## 2023-09-19 ENCOUNTER — Other Ambulatory Visit: Payer: Self-pay

## 2023-09-19 ENCOUNTER — Ambulatory Visit: Payer: Medicare Other | Admitting: Physical Therapy

## 2023-09-19 ENCOUNTER — Encounter: Payer: Medicare Other | Admitting: Speech Pathology

## 2023-09-19 DIAGNOSIS — R451 Restlessness and agitation: Secondary | ICD-10-CM | POA: Diagnosis not present

## 2023-09-19 DIAGNOSIS — N6310 Unspecified lump in the right breast, unspecified quadrant: Secondary | ICD-10-CM | POA: Diagnosis not present

## 2023-09-19 DIAGNOSIS — G20C Parkinsonism, unspecified: Secondary | ICD-10-CM | POA: Diagnosis not present

## 2023-09-19 DIAGNOSIS — D509 Iron deficiency anemia, unspecified: Secondary | ICD-10-CM | POA: Diagnosis not present

## 2023-09-19 DIAGNOSIS — E11620 Type 2 diabetes mellitus with diabetic dermatitis: Secondary | ICD-10-CM | POA: Diagnosis not present

## 2023-09-19 DIAGNOSIS — M8580 Other specified disorders of bone density and structure, unspecified site: Secondary | ICD-10-CM | POA: Diagnosis not present

## 2023-09-19 DIAGNOSIS — M47816 Spondylosis without myelopathy or radiculopathy, lumbar region: Secondary | ICD-10-CM | POA: Diagnosis not present

## 2023-09-19 DIAGNOSIS — M1990 Unspecified osteoarthritis, unspecified site: Secondary | ICD-10-CM | POA: Diagnosis not present

## 2023-09-19 DIAGNOSIS — E440 Moderate protein-calorie malnutrition: Secondary | ICD-10-CM | POA: Diagnosis not present

## 2023-09-19 DIAGNOSIS — G319 Degenerative disease of nervous system, unspecified: Secondary | ICD-10-CM | POA: Diagnosis not present

## 2023-09-19 DIAGNOSIS — M50123 Cervical disc disorder at C6-C7 level with radiculopathy: Secondary | ICD-10-CM | POA: Diagnosis not present

## 2023-09-19 DIAGNOSIS — G4721 Circadian rhythm sleep disorder, delayed sleep phase type: Secondary | ICD-10-CM | POA: Diagnosis not present

## 2023-09-19 DIAGNOSIS — L308 Other specified dermatitis: Secondary | ICD-10-CM | POA: Diagnosis not present

## 2023-09-19 DIAGNOSIS — M48061 Spinal stenosis, lumbar region without neurogenic claudication: Secondary | ICD-10-CM | POA: Diagnosis not present

## 2023-09-19 DIAGNOSIS — K219 Gastro-esophageal reflux disease without esophagitis: Secondary | ICD-10-CM | POA: Diagnosis not present

## 2023-09-19 DIAGNOSIS — I10 Essential (primary) hypertension: Secondary | ICD-10-CM | POA: Diagnosis not present

## 2023-09-19 DIAGNOSIS — E039 Hypothyroidism, unspecified: Secondary | ICD-10-CM | POA: Diagnosis not present

## 2023-09-19 DIAGNOSIS — G4733 Obstructive sleep apnea (adult) (pediatric): Secondary | ICD-10-CM | POA: Diagnosis not present

## 2023-09-19 DIAGNOSIS — K432 Incisional hernia without obstruction or gangrene: Secondary | ICD-10-CM | POA: Diagnosis not present

## 2023-09-19 DIAGNOSIS — M6281 Muscle weakness (generalized): Secondary | ICD-10-CM | POA: Diagnosis not present

## 2023-09-19 DIAGNOSIS — E538 Deficiency of other specified B group vitamins: Secondary | ICD-10-CM | POA: Diagnosis not present

## 2023-09-19 NOTE — Progress Notes (Unsigned)
 09/19/2023 Name: Brittany Benton MRN: [id number] DOB: Aug 20, 1955  Chief Complaint  Patient presents with   Medication Management    Brittany Benton is a 68 y.o. year old female who presented for a telephone visit.   They were referred to the pharmacist by their PCP for assistance in managing Parkinson's Disease and polypharmacy.    Subjective:  Care Team: Primary Care Provider: Lamon Pillow, MD ; Next Scheduled Visit: 09/24/23   Medication Access/Adherence  Current Pharmacy:  [facility] - [place], [place] - [address] [place] [address] Phone: [phone] Fax: [phone]   Patient reports affordability concerns with their medications: No  Patient reports access/transportation concerns to their pharmacy: No  Patient reports adherence concerns with their medications:  No     Parkinson's Disease Current Medication: Sinemet CR 25/100 mg one tablet twice daily (and tolerating well and denies nausea) and has been on this dose for about 1 month - Spouse reports frequency of falls about the same as when she was taing higher dose  - She doesn't take calcium  supplements- one slice of chesse once daily   Medication Review: Levothyroxine  9 am and takes on empty stomach  Ramp outside, hand rales by doors, handles in shower and a bench, walker  Clear floor  She reports she falls from being distracted and goes to PT twice weekly (4-6 additional PT sessions)  Beers List Medications Takes Xanax  PRN  - takes mostly during the day; not interested in stopping medication  Imipramine  - not interested in stopping medication Omeprazole  - feels she would like to remain on medication  Objective:  Lab Results  Component Value Date   HGBA1C 5.2 07/23/2023    Lab Results  Component Value Date   CREATININE 0.89 07/23/2023   BUN 8 07/23/2023   NA 139 07/23/2023   K 4.7 07/23/2023   CL 99 07/23/2023   CO2 23 07/23/2023    Lab Results  Component  Value Date   CHOL 148 07/23/2023   HDL 56 07/23/2023   LDLCALC 66 07/23/2023   TRIG 152 (H) 07/23/2023   CHOLHDL 2.6 07/23/2023    Medications Reviewed Today     Reviewed by Amedeo Bailiff, RPH (Pharmacist) on 09/19/23 at 1058  Med List Status: <None>   Medication Order Taking? Sig Documenting Provider Last Dose Status Informant  ALPRAZolam  (XANAX ) 0.25 MG tablet [id number] Yes TAKE ONE TABLET (0.25 MG) BY MOUTH TWICEDAILY AS NEEDED FOR ANXIETY Lamon Pillow, MD Taking Active   Ascorbic Acid (VITAMIN C PO) [id number] Yes Take 1 tablet by mouth daily. [provider] Taking Active   atorvastatin  (LIPITOR) 20 MG tablet [id number] Yes TAKE 1 TABLET BY MOUTH EVERY EVENING Fisher, Erlinda Haws, MD Taking Active   Carbidopa-Levodopa ER (SINEMET CR) 25-100 MG tablet controlled release [id number] Yes Take 1 tablet by mouth 2 (two) times daily. Rosan Comfort, MD Taking Active            Med Note Vallarie Gauze, [facility] R   Thu Sep 19, 2023 10:43 AM) O/v on 08/06/23 : Dr. Mason Sole noted to slowly increase to eventually 2 tablets twice daily. Patient is taking 1 tablet twice daily due to intolerance  Cholecalciferol  (VITAMIN D -3) 1000 units CAPS [id number] Yes Take 1 capsule by mouth daily. [provider] Taking Active Self  fluconazole  (DIFLUCAN ) 150 MG tablet [id number] Yes Take 150 mg by mouth once. [provider] Taking Active  Med Note Alexandria Angel R   Thu Sep 19, 2023 10:52 AM) Started yesterday  imipramine  (TOFRANIL ) 50 MG tablet [id number] Yes TAKE ONE TABLET BY MOUTH TWICE DAILY Lamon Pillow, MD Taking Active   ketoconazole  (NIZORAL ) 2 % cream [id number] Yes Apply 1 Application topically daily. Until rash resolves completely. Ostwalt, Janna, PA-C Taking Active   levothyroxine  (SYNTHROID ) 88 MCG tablet [id number] Yes Take 0.5 tablets (44 mcg total) by mouth daily. Carlean Charter, DO Taking Active   metoprolol  succinate (TOPROL -XL) 50 MG 24 hr tablet [id number] Yes TAKE  ONE TABLET BY MOUTH EVERY DAY WITH OR IMMEDIATELY FOLLOWING A MEAL Lamon Pillow, MD Taking Active   omeprazole  (PRILOSEC) 20 MG capsule [id number] Yes TAKE 1 CAPSULE BY MOUTH ONCE DAILY Lamon Pillow, MD Taking Active   sertraline  (ZOLOFT ) 100 MG tablet [id number] Yes TAKE ONE TABLET BY MOUTH DAILY Lamon Pillow, MD Taking Active               Assessment/Plan:   Medication Management:  Brittany Benton has a history of falls 2/2 to Parkinson's Disease. Per medication review, patient is taking medications on the Beers List that could increase her risk of falls more than her baseline. Discussed the concern of increased risk of falls due to certain medications that would exist even if she didn't have Parkinson's Disease. However, at this time she is unwilling to change therapy.  - Reviewed recommendation for daily calcium  intake of 1200 mg and vitamin D  intake of [phone] units - Recommended to choose calcium  citrate formulation due to concurrent acid reflux medication - Future consideration for therapeutic substitutions:  - Alprazolam  (as well as all BZDs) may cause falls and fractures; future consideration to switch to lorazepam  as this a BZD better tolerated in elderly patients if she should remain on this medication  -Omeprazole  may increase the risk of bone loss and fractures ; may consider future considerations of on demand PPI therapy or switching to famotidine  - TCAs increase the risk of falls and fractures; will collaborate with PCP for future considerations of alternative therapy    Follow Up Plan: telephone with pharmacist   Alexandria Angel, PharmD Clinical Pharmacist Cell: [phone]

## 2023-09-20 ENCOUNTER — Other Ambulatory Visit: Payer: Self-pay | Admitting: *Deleted

## 2023-09-20 ENCOUNTER — Other Ambulatory Visit: Payer: Self-pay

## 2023-09-20 ENCOUNTER — Encounter: Payer: Self-pay | Admitting: *Deleted

## 2023-09-20 NOTE — Patient Outreach (Signed)
 Complex Care Management   Visit Note  09/20/2023  Name:  Brittany Benton MRN: [id number] DOB: 03/20/1956  Situation: Referral received for Complex Care Management related to Parkinson's Disease. I obtained verbal consent from Patient.  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   GERD (gastroesophageal reflux disease)    Hernia, incisional    after renal surgery   History of chicken pox    Hyperlipidemia    Hypertension    Renal cell carcinoma (HCC)    s/p right nephrectomy 1994   Sleep apnea    Thyroid  disease     Assessment: Patient Reported Symptoms:  Cognitive Cognitive Status: Alert and oriented to person, place, and time, Normal speech and language skills, Struggling with memory recall Cognitive/Intellectual Conditions Management [RPT]: None reported or documented in medical history or problem list   Health Maintenance Behaviors: Annual physical exam, Healthy diet, Sleep adequate, Immunizations Healing Pattern: Unsure Health Facilitated by: Healthy diet, Rest  Neurological Neurological Review of Symptoms: Weakness Neurological Conditions: Parkinson's disease Neurological Management Strategies: Medication therapy, Exercise, Routine screening, Adequate rest (HHPT twice a week) Neurological Comment: neurology appointment at [facility] on 11/05/23  HEENT HEENT Symptoms Reported: Dryness (Mouth) HEENT Conditions:  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 Plans to recheck thyroid  levels 6 weeks after that appointment. B1 and Vit D levels were  WNL.  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 by elevated free T4 and Sinemet.)  Cardiovascular Cardiovascular Symptoms Reported: No symptoms reported Does patient have uncontrolled Hypertension?: No Cardiovascular Conditions: High blood cholesterol, Hypertension Cardiovascular Management Strategies: Medication therapy, Routine screening Cardiovascular Self-Management Outcome: 4 (good)  Respiratory Respiratory Symptoms Reported: No symptoms reported Respiratory Conditions: Sleep disordered breathing Respiratory Self-Management Outcome: 4 (good) Respiratory Comment: since losing weight, OSA has improved and she hasn't used a CPAP in years  Endocrine Patient reports the following symptoms related to hypoglycemia or hyperglycemia : No symptoms reported Is patient diabetic?: No Endocrine Conditions: Other Other Endocrine Conditions: prediabetes Endocrine Self-Management Outcome: 3 (uncertain) Endocrine Comment: followed by PCP. 07/23/23 A1C  5.2%  Gastrointestinal Gastrointestinal Symptoms Reported: Change in appetite Additional Gastrointestinal Details: Decreased appetite. Has discussed with neurologist who thought it was due to dry mouth. Educational materials sent via My Chart. Gastrointestinal Conditions: Reflux/heartburn Gastrointestinal Management Strategies: Medication therapy Gastrointestinal Self-Management Outcome: 3 (uncertain) Nutrition Risk Screen (CP): Reduced oral intake over the last month (30 lb weight loss since February 2025. Has discussed with neurologist.)  Genitourinary Genitourinary Symptoms Reported: No symptoms reported    Integumentary Integumentary Symptoms Reported: No symptoms reported    Musculoskeletal Musculoskelatal Symptoms Reviewed: Other Other Musculoskeletal Symptoms: Tremors Additional Musculoskeletal Details: Parkinson's Disease Musculoskeletal Conditions: Osteopenia, Osteoarthritis, Mobility limited Musculoskeletal  Management Strategies: Medical device, Adequate rest, Exercise Musculoskeletal Self-Management Outcome: 4 (good) Musculoskeletal Comment: HHPT twice a week. Was doing outpatient PT previously. Reports improvement. Falls in the past year?: Yes Number of falls in past year: 1 or less Was there an injury with Fall?: No Fall Risk Category Calculator: 1 Patient Fall Risk Level: Low Fall Risk  Psychosocial Psychosocial Symptoms Reported: Not assessed Behavioral Health Conditions: Anxiety, Depression Behavioral Management Strategies: Medication therapy Behavioral Health Comment: Appt. with Care Management LCSW for counseling rescheduled from 09/23/23 to 10/02/23 per patient's request because she will be out of town on the 9th. Encouraged to reach out to care management team sooner if necessary.          09/12/2023    3:01 PM  Depression screen PHQ 2/9  Decreased Interest 3  Down, Depressed, Hopeless 3  PHQ - 2 Score 6  Altered sleeping 3  Tired, decreased energy 3  Change in appetite 3  Feeling bad or failure about yourself  3  Trouble concentrating 3  Moving slowly or fidgety/restless 3  Suicidal thoughts 0  PHQ-9 Score 24  Difficult doing work/chores Not difficult at all    There were no vitals filed for this visit.  Medications Reviewed Today     Reviewed by Ethelene Herald, RN (Registered Nurse) on 09/20/23 at 1024  Med List Status: <None>   Medication Order Taking? Sig Documenting Provider Last Dose Status Informant  ALPRAZolam  (XANAX ) 0.25 MG tablet [id number] Yes TAKE ONE TABLET (0.25 MG) BY MOUTH TWICEDAILY AS NEEDED FOR ANXIETY Lamon Pillow, MD Taking Active   Ascorbic Acid (VITAMIN C PO) [id number] Yes Take 1 tablet by mouth daily. [provider] Taking Active   atorvastatin  (LIPITOR) 20 MG tablet [id number] Yes TAKE 1 TABLET BY MOUTH EVERY EVENING Fisher, Erlinda Haws, MD Taking Active   Carbidopa-Levodopa ER (SINEMET CR) 25-100 MG tablet controlled release  [id number] Yes Take 1 tablet by mouth 2 (two) times daily. Rosan Comfort, MD Taking Active            Med Note Vallarie Gauze, [facility] R   Thu Sep 19, 2023 10:43 AM) O/v on 08/06/23 : Dr. Mason Sole noted to slowly increase to eventually 2 tablets twice daily. Patient is taking 1 tablet twice daily due to intolerance  Cholecalciferol  (VITAMIN D -3) 1000 units CAPS [id number] Yes Take 1 capsule by mouth daily. [provider] Taking Active Self  fluconazole  (DIFLUCAN ) 150 MG tablet [id number] Yes Take 150 mg by mouth once. [provider] Taking Active            Med Note Vallarie Gauze, [facility] R   Thu Sep 19, 2023 10:52 AM) Started yesterday  imipramine  (TOFRANIL ) 50 MG tablet [id number] Yes TAKE ONE TABLET BY MOUTH TWICE DAILY Lamon Pillow, MD Taking Active   ketoconazole  (NIZORAL ) 2 % cream [id number] Yes Apply 1 Application topically daily. Until rash resolves completely. Ostwalt, Janna, PA-C Taking Active   levothyroxine  (SYNTHROID ) 88 MCG tablet [id number] Yes Take 0.5 tablets (44 mcg total) by mouth daily. Carlean Charter, DO Taking Active   metoprolol  succinate (TOPROL -XL) 50 MG 24 hr tablet [id number] Yes TAKE ONE TABLET BY MOUTH EVERY DAY WITH OR IMMEDIATELY FOLLOWING A MEAL Lamon Pillow, MD Taking Active   omeprazole  (PRILOSEC) 20 MG capsule [id number] Yes TAKE 1 CAPSULE BY MOUTH ONCE DAILY Lamon Pillow, MD Taking Active   sertraline  (ZOLOFT ) 100 MG tablet [id number] Yes TAKE ONE TABLET BY MOUTH DAILY Lamon Pillow, MD Taking Active             Recommendation:   PCP Follow-up Specialty provider follow-up Neurology 11/05/23 Care Management LCSW telephone visit 11/01/23 Care Management Pharmacist telephone visit 10/22/23  Follow Up Plan:   Telephone follow up appointment date/time:  11/08/23 at 10:00  Michele Ahle, RN, BSN [place]  VBCI  Population Health RN Care Manager Direct Dial: [phone]  Fax: [phone]

## 2023-09-20 NOTE — Patient Instructions (Signed)
 Visit Information  Thank you for taking time to visit with me today. Please don't hesitate to contact me if I can be of assistance to you before our next scheduled appointment.  Your next care management appointment is by telephone on 11/08/23 at 10:00  Please call the care guide team at [phone] if you need to cancel, schedule, or reschedule an appointment.   Please call the [place]  National Suicide Prevention Lifeline: 8085712557 or TTY: 202-520-1141 TTY 850-716-3280) to talk to a trained counselor call 911 if you are experiencing a Mental Health or Behavioral Health Crisis or need someone to talk to.  Michele Ahle, RN, BSN [place]  [facility] Health RN Care Manager Direct Dial: [phone]  Fax: [phone]

## 2023-09-23 ENCOUNTER — Telehealth: Admitting: *Deleted

## 2023-09-24 ENCOUNTER — Encounter: Payer: Medicare Other | Admitting: Speech Pathology

## 2023-09-24 ENCOUNTER — Other Ambulatory Visit: Payer: Self-pay | Admitting: Family Medicine

## 2023-09-24 ENCOUNTER — Ambulatory Visit: Payer: Medicare Other | Admitting: Physical Therapy

## 2023-09-24 DIAGNOSIS — I10 Essential (primary) hypertension: Secondary | ICD-10-CM

## 2023-09-27 ENCOUNTER — Encounter: Payer: Medicare Other | Admitting: Speech Pathology

## 2023-09-27 ENCOUNTER — Ambulatory Visit: Payer: Medicare Other | Admitting: Physical Therapy

## 2023-09-27 DIAGNOSIS — M47816 Spondylosis without myelopathy or radiculopathy, lumbar region: Secondary | ICD-10-CM | POA: Diagnosis not present

## 2023-09-27 DIAGNOSIS — M1990 Unspecified osteoarthritis, unspecified site: Secondary | ICD-10-CM | POA: Diagnosis not present

## 2023-09-27 DIAGNOSIS — I10 Essential (primary) hypertension: Secondary | ICD-10-CM | POA: Diagnosis not present

## 2023-09-27 DIAGNOSIS — N6310 Unspecified lump in the right breast, unspecified quadrant: Secondary | ICD-10-CM | POA: Diagnosis not present

## 2023-09-27 DIAGNOSIS — R451 Restlessness and agitation: Secondary | ICD-10-CM | POA: Diagnosis not present

## 2023-09-27 DIAGNOSIS — E440 Moderate protein-calorie malnutrition: Secondary | ICD-10-CM | POA: Diagnosis not present

## 2023-09-27 DIAGNOSIS — G4733 Obstructive sleep apnea (adult) (pediatric): Secondary | ICD-10-CM | POA: Diagnosis not present

## 2023-09-27 DIAGNOSIS — K432 Incisional hernia without obstruction or gangrene: Secondary | ICD-10-CM | POA: Diagnosis not present

## 2023-09-27 DIAGNOSIS — G319 Degenerative disease of nervous system, unspecified: Secondary | ICD-10-CM | POA: Diagnosis not present

## 2023-09-27 DIAGNOSIS — G20C Parkinsonism, unspecified: Secondary | ICD-10-CM | POA: Diagnosis not present

## 2023-09-27 DIAGNOSIS — G4721 Circadian rhythm sleep disorder, delayed sleep phase type: Secondary | ICD-10-CM | POA: Diagnosis not present

## 2023-09-27 DIAGNOSIS — K219 Gastro-esophageal reflux disease without esophagitis: Secondary | ICD-10-CM | POA: Diagnosis not present

## 2023-09-27 DIAGNOSIS — M6281 Muscle weakness (generalized): Secondary | ICD-10-CM | POA: Diagnosis not present

## 2023-09-27 DIAGNOSIS — E538 Deficiency of other specified B group vitamins: Secondary | ICD-10-CM | POA: Diagnosis not present

## 2023-09-27 DIAGNOSIS — M48061 Spinal stenosis, lumbar region without neurogenic claudication: Secondary | ICD-10-CM | POA: Diagnosis not present

## 2023-09-27 DIAGNOSIS — E039 Hypothyroidism, unspecified: Secondary | ICD-10-CM | POA: Diagnosis not present

## 2023-09-27 DIAGNOSIS — M8580 Other specified disorders of bone density and structure, unspecified site: Secondary | ICD-10-CM | POA: Diagnosis not present

## 2023-09-27 DIAGNOSIS — E11620 Type 2 diabetes mellitus with diabetic dermatitis: Secondary | ICD-10-CM | POA: Diagnosis not present

## 2023-09-27 DIAGNOSIS — D509 Iron deficiency anemia, unspecified: Secondary | ICD-10-CM | POA: Diagnosis not present

## 2023-09-27 DIAGNOSIS — L308 Other specified dermatitis: Secondary | ICD-10-CM | POA: Diagnosis not present

## 2023-09-27 DIAGNOSIS — M50123 Cervical disc disorder at C6-C7 level with radiculopathy: Secondary | ICD-10-CM | POA: Diagnosis not present

## 2023-10-01 ENCOUNTER — Encounter: Payer: Medicare Other | Admitting: Speech Pathology

## 2023-10-01 ENCOUNTER — Ambulatory Visit: Payer: Medicare Other | Admitting: Physical Therapy

## 2023-10-02 ENCOUNTER — Other Ambulatory Visit: Payer: Self-pay | Admitting: *Deleted

## 2023-10-02 DIAGNOSIS — E11620 Type 2 diabetes mellitus with diabetic dermatitis: Secondary | ICD-10-CM | POA: Diagnosis not present

## 2023-10-02 DIAGNOSIS — K219 Gastro-esophageal reflux disease without esophagitis: Secondary | ICD-10-CM | POA: Diagnosis not present

## 2023-10-02 DIAGNOSIS — M6281 Muscle weakness (generalized): Secondary | ICD-10-CM | POA: Diagnosis not present

## 2023-10-02 DIAGNOSIS — D509 Iron deficiency anemia, unspecified: Secondary | ICD-10-CM | POA: Diagnosis not present

## 2023-10-02 DIAGNOSIS — E440 Moderate protein-calorie malnutrition: Secondary | ICD-10-CM | POA: Diagnosis not present

## 2023-10-02 DIAGNOSIS — M1990 Unspecified osteoarthritis, unspecified site: Secondary | ICD-10-CM | POA: Diagnosis not present

## 2023-10-02 DIAGNOSIS — R451 Restlessness and agitation: Secondary | ICD-10-CM | POA: Diagnosis not present

## 2023-10-02 DIAGNOSIS — K432 Incisional hernia without obstruction or gangrene: Secondary | ICD-10-CM | POA: Diagnosis not present

## 2023-10-02 DIAGNOSIS — E039 Hypothyroidism, unspecified: Secondary | ICD-10-CM | POA: Diagnosis not present

## 2023-10-02 DIAGNOSIS — M8580 Other specified disorders of bone density and structure, unspecified site: Secondary | ICD-10-CM | POA: Diagnosis not present

## 2023-10-02 DIAGNOSIS — G4733 Obstructive sleep apnea (adult) (pediatric): Secondary | ICD-10-CM | POA: Diagnosis not present

## 2023-10-02 DIAGNOSIS — G20C Parkinsonism, unspecified: Secondary | ICD-10-CM | POA: Diagnosis not present

## 2023-10-02 DIAGNOSIS — G4721 Circadian rhythm sleep disorder, delayed sleep phase type: Secondary | ICD-10-CM | POA: Diagnosis not present

## 2023-10-02 DIAGNOSIS — I10 Essential (primary) hypertension: Secondary | ICD-10-CM | POA: Diagnosis not present

## 2023-10-02 DIAGNOSIS — G319 Degenerative disease of nervous system, unspecified: Secondary | ICD-10-CM | POA: Diagnosis not present

## 2023-10-02 DIAGNOSIS — E538 Deficiency of other specified B group vitamins: Secondary | ICD-10-CM | POA: Diagnosis not present

## 2023-10-02 DIAGNOSIS — N6310 Unspecified lump in the right breast, unspecified quadrant: Secondary | ICD-10-CM | POA: Diagnosis not present

## 2023-10-02 DIAGNOSIS — M50123 Cervical disc disorder at C6-C7 level with radiculopathy: Secondary | ICD-10-CM | POA: Diagnosis not present

## 2023-10-02 DIAGNOSIS — L308 Other specified dermatitis: Secondary | ICD-10-CM | POA: Diagnosis not present

## 2023-10-02 DIAGNOSIS — M48061 Spinal stenosis, lumbar region without neurogenic claudication: Secondary | ICD-10-CM | POA: Diagnosis not present

## 2023-10-02 DIAGNOSIS — M47816 Spondylosis without myelopathy or radiculopathy, lumbar region: Secondary | ICD-10-CM | POA: Diagnosis not present

## 2023-10-02 NOTE — Patient Outreach (Signed)
 Complex Care Management   Visit Note  10/02/2023  Name:  Brittany Benton MRN: [id number] DOB: [birth year]  Situation: Referral received for Complex Care Management related to Mental/Behavioral Health diagnosis Anxiety, Stress, Depression. I obtained verbal consent from Patient.  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   GERD (gastroesophageal reflux disease)    Hernia, incisional    after renal surgery   History of chicken pox    Hyperlipidemia    Hypertension    Renal cell carcinoma (HCC)    s/p right nephrectomy 1994   Sleep apnea    Thyroid  disease     Assessment: Patient Reported Symptoms:  Cognitive Cognitive Status: Alert and oriented to person, place, and time, Normal speech and language skills, Struggling with memory recall Cognitive/Intellectual Conditions Management [RPT]: None reported or documented in medical history or problem list   Health Maintenance Behaviors: Annual physical exam, Healthy diet, Sleep adequate Healing Pattern: Unsure Health Facilitated by: Healthy diet, Rest  Neurological Neurological Review of Symptoms: Weakness Neurological Conditions: Parkinson's disease Neurological Management Strategies: Medication therapy, Routine screening, Adequate rest Neurological Comment: Follow up with Neurology at [facility] on 11/05/23  HEENT HEENT Symptoms Reported: Dryness (patient states that she has had dry mouth for months-drinks water and will have a lemon drop)      Cardiovascular Cardiovascular Symptoms Reported: No symptoms reported    Respiratory Respiratory Symptoms Reported: No symptoms reported    Endocrine Patient reports the following symptoms related to hypoglycemia or hyperglycemia : No symptoms reported    Gastrointestinal  Gastrointestinal Symptoms Reported: Incontinence, Diarrhea Additional Gastrointestinal Details: Patient reports having no appetite-reports losing weight loose stools 1x per day after a meal Gastrointestinal Conditions: Diarrhea Gastrointestinal Management Strategies: Incontinence garment/pad Gastrointestinal Self-Management Outcome: 3 (uncertain) Nutrition Risk Screen (CP): Reduced oral intake over the last month  Genitourinary Genitourinary Symptoms Reported: No symptoms reported    Integumentary Integumentary Symptoms Reported: No symptoms reported    Musculoskeletal Musculoskelatal Symptoms Reviewed: Unsteady gait, Difficulty walking Other Musculoskeletal Symptoms: tremors Additional Musculoskeletal Details: Parkinson's Disease Musculoskeletal Conditions: Osteopenia, Osteoarthritis, Mobility limited Musculoskeletal Management Strategies: Adequate rest, Medical device Musculoskeletal Self-Management Outcome: 4 (good) Musculoskeletal Comment: HHPT twice a week Falls in the past year?: Yes Number of falls in past year: 1 or less Was there an injury with Fall?: No Fall Risk Category Calculator: 1 Patient Fall Risk Level: Low Fall Risk Patient at Risk for Falls Due to: Impaired balance/gait, Impaired mobility, History of fall(s) Fall risk Follow up: Falls prevention discussed  Psychosocial Psychosocial Symptoms Reported: Depression - if selected complete PHQ 2-9 Behavioral Health Conditions: Anxiety, Depression Behavioral Management Strategies: Medication therapy Major Change/Loss/Stressor/Fears (CP): Medical condition, self Behaviors When Feeling Stressed/Fearful: Patient confirmed imrprovement in mood, acknowledges angry outbursts at times with spouse-interested in developing additional supports-support groups and follow up with psychiatry Techniques to Cope with Loss/Stress/Change: Medication, Withdraw Quality of Family Relationships: helpful, involved, supportive Do you feel  physically threatened by others?: No      10/02/2023    9:13 AM  Depression screen PHQ 2/9  Decreased Interest 0  Down, Depressed, Hopeless 0  PHQ - 2 Score 0    There were no vitals filed for this visit.  Medications Reviewed Today  Reviewed by Ave Leisure, LCSW (Social Worker) on 10/02/23 at 0932  Med List Status: <None>   Medication Order Taking? Sig Documenting Provider Last Dose Status Informant  ALPRAZolam  (XANAX ) 0.25 MG tablet [id number] Yes TAKE ONE TABLET (0.25 MG) BY MOUTH TWICEDAILY AS NEEDED FOR ANXIETY Lamon Pillow, MD  Active   Ascorbic Acid (VITAMIN C PO) [id number] Yes Take 1 tablet by mouth daily. [provider]  Active   atorvastatin  (LIPITOR) 20 MG tablet [id number] Yes TAKE 1 TABLET BY MOUTH EVERY EVENING Fisher, Erlinda Haws, MD  Active   Carbidopa-Levodopa ER (SINEMET CR) 25-100 MG tablet controlled release [id number] Yes Take 1 tablet by mouth 2 (two) times daily. Rosan Comfort, MD  Active            Med Note Vallarie Gauze, [facility] R   Thu Sep 19, 2023 10:43 AM) O/v on 08/06/23 : Dr. Mason Sole noted to slowly increase to eventually 2 tablets twice daily. Patient is taking 1 tablet twice daily due to intolerance  Cholecalciferol  (VITAMIN D -3) 1000 units CAPS [id number] Yes Take 1 capsule by mouth daily. [provider]  Active Self  fluconazole  (DIFLUCAN ) 150 MG tablet [id number] Yes Take 150 mg by mouth once. [provider]  Active            Med Note Vallarie Gauze, [facility] R   Thu Sep 19, 2023 10:52 AM) Started yesterday  imipramine  (TOFRANIL ) 50 MG tablet [id number] Yes TAKE ONE TABLET BY MOUTH TWICE DAILY Lamon Pillow, MD  Active   ketoconazole  (NIZORAL ) 2 % cream [id number] Yes Apply 1 Application topically daily. Until rash resolves completely. Ostwalt, Janna, PA-C  Active   levothyroxine  (SYNTHROID ) 88 MCG tablet [id number] Yes Take 0.5 tablets (44 mcg total) by mouth daily. Carlean Charter, DO  Active   metoprolol  succinate (TOPROL -XL) 50 MG 24  hr tablet [id number] Yes TAKE ONE TABLET BY MOUTH ONCE DAILY WITHOR IMMEDIATELY FOLLOWING A MEAL Lamon Pillow, MD  Active   omeprazole  (PRILOSEC) 20 MG capsule [id number] Yes TAKE 1 CAPSULE BY MOUTH ONCE DAILY Lamon Pillow, MD  Active   sertraline  (ZOLOFT ) 100 MG tablet [id number] Yes TAKE ONE TABLET BY MOUTH DAILY Shann Darnel Erlinda Haws, MD  Active             Recommendation:   PCP Follow-up Specialty provider follow-up as scheduled Review resources provided for in home care and Parkinson's support   Follow Up Plan:   Patient has met all care management goals. Care Management case will be closed to social work only. RNCM to continue follow.  Patient has been provided contact information should new needs arise.   Chris Narasimhan, LCSW [place]  [facility], [facility] Licensed Clinical Social Worker  Direct Dial: [phone]

## 2023-10-02 NOTE — Patient Instructions (Addendum)
 Visit Information  Thank you for taking time to visit with me today. Please don't hesitate to contact me if I can be of assistance to you before our next scheduled appointment.  Your next appointment is by telephone on 11/08/23 at 10am with RNCM Please call the care guide team at [phone] if you need to cancel or reschedule your appointment.   Following is a copy of your care plan:   Goals Addressed             This Visit's Progress    VBCI Social Work Care Plan-LCSW       Problems:   Level of Care Concerns:Inability to perform ADL's independently  CSW Clinical Goal(s):   Patient requestes.with resources for Parkinsons support and in home care  Interventions:  Level of Care Concerns in a patient with Parkinson's Current level of care: Home with other family or significant other(s): spouse  Evaluation of patient's unmet needs in current living environment ADL's Assessed needs, level of care concerns, how currently meeting needs and barriers to care Discuss community support options (provided resources for Parkinson's  Support ) Discussed family support and building support system : encouraged follow up with support groups provided and natural supports Discussed private pay options for personal care needs (in home care resources provided)  Mental Health:  Evaluation of current treatment plan related to Anxiety and Depression Confirmed having a long history of Anxiety(over 10 years) denies having any episodes of panic attacks Active listening / Reflection utilized related to patient's current medical condtion Behavioral Activation reviewed Discussed referral for psychiatry: referral pending Emotional Support Provided Participation in support group encouraged : resources provided PHQ2/PHQ9 completed GAD7 completed Problem Solving /Task Center strategies reviewed  Patient Goals/Self-Care Activities:  Connect with provider regarding status of referral to psychiatry for   ongoing mental health treatment if needed Continue taking your medication as prescribed.   Review educational material on local support groups provided.  Plan:   No further follow up required: patient provided with requested resources for local support groups and in home care support.         Please call the Suicide and Crisis Lifeline: 988 if you are experiencing a Mental Health or Behavioral Health Crisis or need someone to talk to.  Patient verbalizes understanding of instructions and care plan provided today and agrees to view in MyChart. Active MyChart status and patient understanding of how to access instructions and care plan via MyChart confirmed with patient.     Sheranda Seabrooks, LCSW Brittany Benton  [facility], [facility] Licensed Clinical Social Worker  Direct Dial: [phone]

## 2023-10-03 ENCOUNTER — Ambulatory Visit: Payer: Medicare Other | Admitting: Physical Therapy

## 2023-10-03 ENCOUNTER — Encounter: Payer: Medicare Other | Admitting: Speech Pathology

## 2023-10-03 DIAGNOSIS — D509 Iron deficiency anemia, unspecified: Secondary | ICD-10-CM | POA: Diagnosis not present

## 2023-10-03 DIAGNOSIS — M48061 Spinal stenosis, lumbar region without neurogenic claudication: Secondary | ICD-10-CM | POA: Diagnosis not present

## 2023-10-03 DIAGNOSIS — G20C Parkinsonism, unspecified: Secondary | ICD-10-CM | POA: Diagnosis not present

## 2023-10-03 DIAGNOSIS — K219 Gastro-esophageal reflux disease without esophagitis: Secondary | ICD-10-CM | POA: Diagnosis not present

## 2023-10-03 DIAGNOSIS — E440 Moderate protein-calorie malnutrition: Secondary | ICD-10-CM | POA: Diagnosis not present

## 2023-10-03 DIAGNOSIS — I10 Essential (primary) hypertension: Secondary | ICD-10-CM | POA: Diagnosis not present

## 2023-10-03 DIAGNOSIS — G4721 Circadian rhythm sleep disorder, delayed sleep phase type: Secondary | ICD-10-CM | POA: Diagnosis not present

## 2023-10-03 DIAGNOSIS — E538 Deficiency of other specified B group vitamins: Secondary | ICD-10-CM | POA: Diagnosis not present

## 2023-10-03 DIAGNOSIS — E039 Hypothyroidism, unspecified: Secondary | ICD-10-CM | POA: Diagnosis not present

## 2023-10-03 DIAGNOSIS — M8580 Other specified disorders of bone density and structure, unspecified site: Secondary | ICD-10-CM | POA: Diagnosis not present

## 2023-10-03 DIAGNOSIS — M47816 Spondylosis without myelopathy or radiculopathy, lumbar region: Secondary | ICD-10-CM | POA: Diagnosis not present

## 2023-10-03 DIAGNOSIS — M50123 Cervical disc disorder at C6-C7 level with radiculopathy: Secondary | ICD-10-CM | POA: Diagnosis not present

## 2023-10-03 DIAGNOSIS — M6281 Muscle weakness (generalized): Secondary | ICD-10-CM | POA: Diagnosis not present

## 2023-10-03 DIAGNOSIS — K432 Incisional hernia without obstruction or gangrene: Secondary | ICD-10-CM | POA: Diagnosis not present

## 2023-10-03 DIAGNOSIS — E11620 Type 2 diabetes mellitus with diabetic dermatitis: Secondary | ICD-10-CM | POA: Diagnosis not present

## 2023-10-03 DIAGNOSIS — G4733 Obstructive sleep apnea (adult) (pediatric): Secondary | ICD-10-CM | POA: Diagnosis not present

## 2023-10-03 DIAGNOSIS — R451 Restlessness and agitation: Secondary | ICD-10-CM | POA: Diagnosis not present

## 2023-10-03 DIAGNOSIS — L308 Other specified dermatitis: Secondary | ICD-10-CM | POA: Diagnosis not present

## 2023-10-03 DIAGNOSIS — M1990 Unspecified osteoarthritis, unspecified site: Secondary | ICD-10-CM | POA: Diagnosis not present

## 2023-10-03 DIAGNOSIS — G319 Degenerative disease of nervous system, unspecified: Secondary | ICD-10-CM | POA: Diagnosis not present

## 2023-10-03 DIAGNOSIS — N6310 Unspecified lump in the right breast, unspecified quadrant: Secondary | ICD-10-CM | POA: Diagnosis not present

## 2023-10-05 ENCOUNTER — Other Ambulatory Visit: Payer: Self-pay

## 2023-10-05 ENCOUNTER — Emergency Department

## 2023-10-05 ENCOUNTER — Inpatient Hospital Stay
Admission: EM | Admit: 2023-10-05 | Discharge: 2023-10-09 | DRG: 057 | Disposition: A | Discharge status: Skilled Nursing Facility | Attending: Student | Admitting: Student

## 2023-10-05 DIAGNOSIS — N390 Urinary tract infection, site not specified: Principal | ICD-10-CM

## 2023-10-05 DIAGNOSIS — G4733 Obstructive sleep apnea (adult) (pediatric): Secondary | ICD-10-CM | POA: Diagnosis not present

## 2023-10-05 DIAGNOSIS — R6889 Other general symptoms and signs: Secondary | ICD-10-CM | POA: Diagnosis not present

## 2023-10-05 DIAGNOSIS — R296 Repeated falls: Secondary | ICD-10-CM | POA: Diagnosis present

## 2023-10-05 DIAGNOSIS — G20A1 Parkinson's disease without dyskinesia, without mention of fluctuations: Secondary | ICD-10-CM | POA: Diagnosis not present

## 2023-10-05 DIAGNOSIS — E876 Hypokalemia: Secondary | ICD-10-CM | POA: Diagnosis present

## 2023-10-05 DIAGNOSIS — Z85528 Personal history of other malignant neoplasm of kidney: Secondary | ICD-10-CM | POA: Diagnosis not present

## 2023-10-05 DIAGNOSIS — Z6831 Body mass index (BMI) 31.0-31.9, adult: Secondary | ICD-10-CM

## 2023-10-05 DIAGNOSIS — Z905 Acquired absence of kidney: Secondary | ICD-10-CM | POA: Diagnosis not present

## 2023-10-05 DIAGNOSIS — Z888 Allergy status to other drugs, medicaments and biological substances status: Secondary | ICD-10-CM

## 2023-10-05 DIAGNOSIS — Z87440 Personal history of urinary (tract) infections: Secondary | ICD-10-CM | POA: Diagnosis not present

## 2023-10-05 DIAGNOSIS — Z96641 Presence of right artificial hip joint: Secondary | ICD-10-CM | POA: Diagnosis not present

## 2023-10-05 DIAGNOSIS — I951 Orthostatic hypotension: Secondary | ICD-10-CM | POA: Diagnosis not present

## 2023-10-05 DIAGNOSIS — R630 Anorexia: Secondary | ICD-10-CM | POA: Diagnosis present

## 2023-10-05 DIAGNOSIS — I959 Hypotension, unspecified: Secondary | ICD-10-CM

## 2023-10-05 DIAGNOSIS — F410 Panic disorder [episodic paroxysmal anxiety] without agoraphobia: Secondary | ICD-10-CM | POA: Diagnosis present

## 2023-10-05 DIAGNOSIS — E785 Hyperlipidemia, unspecified: Secondary | ICD-10-CM | POA: Diagnosis present

## 2023-10-05 DIAGNOSIS — R531 Weakness: Secondary | ICD-10-CM | POA: Diagnosis present

## 2023-10-05 DIAGNOSIS — Z9181 History of falling: Secondary | ICD-10-CM

## 2023-10-05 DIAGNOSIS — Z9049 Acquired absence of other specified parts of digestive tract: Secondary | ICD-10-CM

## 2023-10-05 DIAGNOSIS — Z885 Allergy status to narcotic agent status: Secondary | ICD-10-CM

## 2023-10-05 DIAGNOSIS — I10 Essential (primary) hypertension: Secondary | ICD-10-CM | POA: Diagnosis not present

## 2023-10-05 DIAGNOSIS — E66811 Obesity, class 1: Secondary | ICD-10-CM | POA: Diagnosis present

## 2023-10-05 DIAGNOSIS — Z803 Family history of malignant neoplasm of breast: Secondary | ICD-10-CM

## 2023-10-05 DIAGNOSIS — Z8249 Family history of ischemic heart disease and other diseases of the circulatory system: Secondary | ICD-10-CM | POA: Diagnosis not present

## 2023-10-05 DIAGNOSIS — Z79899 Other long term (current) drug therapy: Secondary | ICD-10-CM

## 2023-10-05 DIAGNOSIS — E039 Hypothyroidism, unspecified: Secondary | ICD-10-CM | POA: Diagnosis present

## 2023-10-05 DIAGNOSIS — F32A Depression, unspecified: Secondary | ICD-10-CM | POA: Diagnosis present

## 2023-10-05 DIAGNOSIS — D329 Benign neoplasm of meninges, unspecified: Secondary | ICD-10-CM | POA: Diagnosis present

## 2023-10-05 DIAGNOSIS — Z79890 Hormone replacement therapy (postmenopausal): Secondary | ICD-10-CM

## 2023-10-05 DIAGNOSIS — Z8262 Family history of osteoporosis: Secondary | ICD-10-CM | POA: Diagnosis not present

## 2023-10-05 DIAGNOSIS — K219 Gastro-esophageal reflux disease without esophagitis: Secondary | ICD-10-CM | POA: Diagnosis present

## 2023-10-05 DIAGNOSIS — Z743 Need for continuous supervision: Secondary | ICD-10-CM | POA: Diagnosis not present

## 2023-10-05 DIAGNOSIS — Z818 Family history of other mental and behavioral disorders: Secondary | ICD-10-CM

## 2023-10-05 DIAGNOSIS — R8281 Pyuria: Secondary | ICD-10-CM | POA: Diagnosis present

## 2023-10-05 HISTORY — DX: Parkinson's disease without dyskinesia, without mention of fluctuations: G20.A1

## 2023-10-05 LAB — COMPREHENSIVE METABOLIC PANEL WITH GFR
ALT: 9 U/L (ref 0–44)
AST: 20 U/L (ref 15–41)
Albumin: 3.6 g/dL (ref 3.5–5.0)
Alkaline Phosphatase: 87 U/L (ref 38–126)
Anion gap: 13 (ref 5–15)
BUN: 14 mg/dL (ref 8–23)
CO2: 23 mmol/L (ref 22–32)
Calcium: 8.9 mg/dL (ref 8.9–10.3)
Chloride: 100 mmol/L (ref 98–111)
Creatinine, Ser: 0.96 mg/dL (ref 0.44–1.00)
GFR, Estimated: >60 mL/min (ref >60)
Glucose, Bld: 90 mg/dL (ref 70–99)
Potassium: 3.6 mmol/L (ref 3.5–5.1)
Sodium: 136 mmol/L (ref 135–145)
Total Bilirubin: 1.3 mg/dL — ABNORMAL HIGH (ref 0.0–1.2)
Total Protein: 6.9 g/dL (ref 6.5–8.1)

## 2023-10-05 LAB — URINALYSIS, W/ REFLEX TO CULTURE (INFECTION SUSPECTED)
Bacteria, UA: NONE SEEN
Bilirubin Urine: NEGATIVE
Glucose, UA: NEGATIVE mg/dL
Hgb urine dipstick: NEGATIVE
Ketones, ur: 20 mg/dL — AB
Nitrite: NEGATIVE
Protein, ur: 30 mg/dL — AB
Specific Gravity, Urine: 1.027 (ref 1.005–1.030)
pH: 5 (ref 5.0–8.0)

## 2023-10-05 LAB — CBC WITH DIFFERENTIAL/PLATELET
Abs Immature Granulocytes: 0.03 10*3/uL (ref 0.00–0.07)
Basophils Absolute: 0 10*3/uL (ref 0.0–0.1)
Basophils Relative: 1 %
Eosinophils Absolute: 0.1 10*3/uL (ref 0.0–0.5)
Eosinophils Relative: 2 %
HCT: 44.3 % (ref 36.0–46.0)
Hemoglobin: 14.3 g/dL (ref 12.0–15.0)
Immature Granulocytes: 0 %
Lymphocytes Relative: 18 %
Lymphs Abs: 1.3 10*3/uL (ref 0.7–4.0)
MCH: 27.5 pg (ref 26.0–34.0)
MCHC: 32.3 g/dL (ref 30.0–36.0)
MCV: 85.2 fL (ref 80.0–100.0)
Monocytes Absolute: 0.8 10*3/uL (ref 0.1–1.0)
Monocytes Relative: 11 %
Neutro Abs: 4.9 10*3/uL (ref 1.7–7.7)
Neutrophils Relative %: 68 %
Platelets: 239 10*3/uL (ref 150–400)
RBC: 5.2 MIL/uL — ABNORMAL HIGH (ref 3.87–5.11)
RDW: 13.9 % (ref 11.5–15.5)
WBC: 7.2 10*3/uL (ref 4.0–10.5)
nRBC: 0 % (ref 0.0–0.2)

## 2023-10-05 LAB — TSH: TSH: 2.073 u[IU]/mL (ref 0.350–4.500)

## 2023-10-05 LAB — TROPONIN I (HIGH SENSITIVITY): Troponin I (High Sensitivity): 5 ng/L (ref <18)

## 2023-10-05 NOTE — ED Notes (Signed)
 Pt straight cath'd for urine sample. Pt tolerated well, urine sent to lab.  Pt cleaned and placed in clean brief.

## 2023-10-05 NOTE — ED Triage Notes (Signed)
 Pt brought in by EMS for increased weakness and worsening parkinson symptoms.  Per EMS pt was on the toilet and unable to move from toilet upon their arrival.  Pt sts that she has had decreased appetite and weakness.  Pt also sts that husband told her that he can no longer care for her.  EMS reports a BP of 99/45, but this is pt's baseline.  All other VSS.  Pt denies any pain.

## 2023-10-06 ENCOUNTER — Encounter: Payer: Self-pay | Admitting: Internal Medicine

## 2023-10-06 DIAGNOSIS — I959 Hypotension, unspecified: Secondary | ICD-10-CM

## 2023-10-06 DIAGNOSIS — R630 Anorexia: Secondary | ICD-10-CM | POA: Diagnosis present

## 2023-10-06 DIAGNOSIS — E876 Hypokalemia: Secondary | ICD-10-CM | POA: Diagnosis present

## 2023-10-06 DIAGNOSIS — Z85528 Personal history of other malignant neoplasm of kidney: Secondary | ICD-10-CM | POA: Diagnosis not present

## 2023-10-06 DIAGNOSIS — M1611 Unilateral primary osteoarthritis, right hip: Secondary | ICD-10-CM | POA: Diagnosis not present

## 2023-10-06 DIAGNOSIS — G4733 Obstructive sleep apnea (adult) (pediatric): Secondary | ICD-10-CM | POA: Diagnosis present

## 2023-10-06 DIAGNOSIS — R2689 Other abnormalities of gait and mobility: Secondary | ICD-10-CM | POA: Diagnosis not present

## 2023-10-06 DIAGNOSIS — Z9049 Acquired absence of other specified parts of digestive tract: Secondary | ICD-10-CM | POA: Diagnosis not present

## 2023-10-06 DIAGNOSIS — Z888 Allergy status to other drugs, medicaments and biological substances status: Secondary | ICD-10-CM | POA: Diagnosis not present

## 2023-10-06 DIAGNOSIS — R531 Weakness: Secondary | ICD-10-CM | POA: Diagnosis not present

## 2023-10-06 DIAGNOSIS — F32A Depression, unspecified: Secondary | ICD-10-CM | POA: Insufficient documentation

## 2023-10-06 DIAGNOSIS — E785 Hyperlipidemia, unspecified: Secondary | ICD-10-CM | POA: Diagnosis present

## 2023-10-06 DIAGNOSIS — E039 Hypothyroidism, unspecified: Secondary | ICD-10-CM | POA: Diagnosis present

## 2023-10-06 DIAGNOSIS — Z743 Need for continuous supervision: Secondary | ICD-10-CM | POA: Diagnosis not present

## 2023-10-06 DIAGNOSIS — R296 Repeated falls: Secondary | ICD-10-CM | POA: Diagnosis present

## 2023-10-06 DIAGNOSIS — I10 Essential (primary) hypertension: Secondary | ICD-10-CM | POA: Diagnosis not present

## 2023-10-06 DIAGNOSIS — G20A1 Parkinson's disease without dyskinesia, without mention of fluctuations: Secondary | ICD-10-CM | POA: Diagnosis not present

## 2023-10-06 DIAGNOSIS — E66811 Obesity, class 1: Secondary | ICD-10-CM | POA: Diagnosis present

## 2023-10-06 DIAGNOSIS — N390 Urinary tract infection, site not specified: Secondary | ICD-10-CM

## 2023-10-06 DIAGNOSIS — Z8249 Family history of ischemic heart disease and other diseases of the circulatory system: Secondary | ICD-10-CM | POA: Diagnosis not present

## 2023-10-06 DIAGNOSIS — Z7401 Bed confinement status: Secondary | ICD-10-CM | POA: Diagnosis not present

## 2023-10-06 DIAGNOSIS — Z6831 Body mass index (BMI) 31.0-31.9, adult: Secondary | ICD-10-CM | POA: Diagnosis not present

## 2023-10-06 DIAGNOSIS — Z96641 Presence of right artificial hip joint: Secondary | ICD-10-CM | POA: Diagnosis present

## 2023-10-06 DIAGNOSIS — Z905 Acquired absence of kidney: Secondary | ICD-10-CM | POA: Diagnosis not present

## 2023-10-06 DIAGNOSIS — Z8262 Family history of osteoporosis: Secondary | ICD-10-CM | POA: Diagnosis not present

## 2023-10-06 DIAGNOSIS — Z885 Allergy status to narcotic agent status: Secondary | ICD-10-CM | POA: Diagnosis not present

## 2023-10-06 DIAGNOSIS — D329 Benign neoplasm of meninges, unspecified: Secondary | ICD-10-CM | POA: Diagnosis present

## 2023-10-06 DIAGNOSIS — Z87440 Personal history of urinary (tract) infections: Secondary | ICD-10-CM | POA: Diagnosis not present

## 2023-10-06 DIAGNOSIS — Z79890 Hormone replacement therapy (postmenopausal): Secondary | ICD-10-CM | POA: Diagnosis not present

## 2023-10-06 DIAGNOSIS — M6281 Muscle weakness (generalized): Secondary | ICD-10-CM | POA: Diagnosis not present

## 2023-10-06 DIAGNOSIS — R0902 Hypoxemia: Secondary | ICD-10-CM | POA: Diagnosis not present

## 2023-10-06 DIAGNOSIS — I951 Orthostatic hypotension: Secondary | ICD-10-CM | POA: Diagnosis present

## 2023-10-06 LAB — BASIC METABOLIC PANEL WITH GFR
Anion gap: 8 (ref 5–15)
BUN: 11 mg/dL (ref 8–23)
CO2: 26 mmol/L (ref 22–32)
Calcium: 7.9 mg/dL — ABNORMAL LOW (ref 8.9–10.3)
Chloride: 105 mmol/L (ref 98–111)
Creatinine, Ser: 0.84 mg/dL (ref 0.44–1.00)
GFR, Estimated: >60 mL/min (ref >60)
Glucose, Bld: 86 mg/dL (ref 70–99)
Potassium: 3.2 mmol/L — ABNORMAL LOW (ref 3.5–5.1)
Sodium: 139 mmol/L (ref 135–145)

## 2023-10-06 LAB — HIV ANTIBODY (ROUTINE TESTING W REFLEX): HIV Screen 4th Generation wRfx: NONREACTIVE

## 2023-10-06 LAB — TROPONIN I (HIGH SENSITIVITY): Troponin I (High Sensitivity): 6 ng/L (ref <18)

## 2023-10-06 LAB — MAGNESIUM: Magnesium: 1.7 mg/dL (ref 1.7–2.4)

## 2023-10-06 LAB — PHOSPHORUS: Phosphorus: 3.1 mg/dL (ref 2.5–4.6)

## 2023-10-06 MED ORDER — ENOXAPARIN SODIUM 40 MG/0.4ML IJ SOSY
0.5000 mg/kg | PREFILLED_SYRINGE | INTRAMUSCULAR | Status: DC
  Administered 2023-10-06 – 2023-10-07 (×2): 37.5 mg via SUBCUTANEOUS
  Filled 2023-10-06 (×3): qty 0.4

## 2023-10-06 MED ORDER — ONDANSETRON HCL 4 MG/2ML IJ SOLN: 4.0000 mg | Freq: Four times a day (QID) | INTRAMUSCULAR | Status: DC | PRN

## 2023-10-06 MED ORDER — ALPRAZOLAM 0.25 MG PO TABS
0.2500 mg | ORAL_TABLET | Freq: Once | ORAL | Status: AC
  Administered 2023-10-06: 0.25 mg via ORAL
  Filled 2023-10-06: qty 1

## 2023-10-06 MED ORDER — ACETAMINOPHEN 325 MG PO TABS: 650.0000 mg | ORAL_TABLET | Freq: Four times a day (QID) | ORAL | Status: DC | PRN

## 2023-10-06 MED ORDER — ENSURE PLUS HIGH PROTEIN PO LIQD
237.0000 mL | Freq: Three times a day (TID) | ORAL | Status: DC
  Administered 2023-10-06 (×2): 237 mL via ORAL

## 2023-10-06 MED ORDER — CARBIDOPA-LEVODOPA ER 25-100 MG PO TBCR
1.0000 | EXTENDED_RELEASE_TABLET | Freq: Two times a day (BID) | ORAL | Status: DC
  Administered 2023-10-06 – 2023-10-09 (×8): 1 via ORAL
  Filled 2023-10-06 (×8): qty 1

## 2023-10-06 MED ORDER — MAGNESIUM SULFATE 2 GM/50ML IV SOLN
2.0000 g | Freq: Once | INTRAVENOUS | Status: AC
  Administered 2023-10-06: 2 g via INTRAVENOUS
  Filled 2023-10-06: qty 50

## 2023-10-06 MED ORDER — MIDODRINE HCL 5 MG PO TABS
5.0000 mg | ORAL_TABLET | Freq: Three times a day (TID) | ORAL | Status: DC
  Administered 2023-10-06 – 2023-10-09 (×7): 5 mg via ORAL
  Filled 2023-10-06 (×8): qty 1

## 2023-10-06 MED ORDER — HYDROCODONE-ACETAMINOPHEN 5-325 MG PO TABS: 1.0000 | ORAL_TABLET | ORAL | Status: DC | PRN

## 2023-10-06 MED ORDER — SODIUM CHLORIDE 0.9 % IV SOLN
2.0000 g | Freq: Once | INTRAVENOUS | Status: AC
  Administered 2023-10-06: 2 g via INTRAVENOUS
  Filled 2023-10-06: qty 20

## 2023-10-06 MED ORDER — SODIUM CHLORIDE 0.9 % IV SOLN
INTRAVENOUS | Status: AC
Start: 2023-10-06 — End: 2023-10-06

## 2023-10-06 MED ORDER — SODIUM CHLORIDE 0.9 % IV SOLN
1.0000 g | INTRAVENOUS | Status: DC
  Administered 2023-10-07: 1 g via INTRAVENOUS
  Filled 2023-10-06: qty 10

## 2023-10-06 MED ORDER — POTASSIUM CHLORIDE CRYS ER 20 MEQ PO TBCR
40.0000 meq | EXTENDED_RELEASE_TABLET | ORAL | Status: AC
  Administered 2023-10-06 (×2): 40 meq via ORAL
  Filled 2023-10-06 (×2): qty 2

## 2023-10-06 MED ORDER — ATORVASTATIN CALCIUM 20 MG PO TABS
20.0000 mg | ORAL_TABLET | Freq: Every evening | ORAL | Status: DC
  Administered 2023-10-06 – 2023-10-08 (×4): 20 mg via ORAL
  Filled 2023-10-06 (×4): qty 1

## 2023-10-06 MED ORDER — LEVOTHYROXINE SODIUM 88 MCG PO TABS
44.0000 ug | ORAL_TABLET | Freq: Every day | ORAL | Status: DC
  Administered 2023-10-06 – 2023-10-09 (×4): 44 ug via ORAL
  Filled 2023-10-06 (×4): qty 1

## 2023-10-06 MED ORDER — SODIUM CHLORIDE 0.9 % IV BOLUS (SEPSIS)
1000.0000 mL | Freq: Once | INTRAVENOUS | Status: AC
  Administered 2023-10-06: 1000 mL via INTRAVENOUS

## 2023-10-06 MED ORDER — ONDANSETRON HCL 4 MG PO TABS: 4.0000 mg | ORAL_TABLET | Freq: Four times a day (QID) | ORAL | Status: DC | PRN

## 2023-10-06 MED ORDER — IMIPRAMINE HCL 50 MG PO TABS
50.0000 mg | ORAL_TABLET | Freq: Two times a day (BID) | ORAL | Status: DC
  Administered 2023-10-06 – 2023-10-09 (×8): 50 mg via ORAL
  Filled 2023-10-06 (×8): qty 1

## 2023-10-06 MED ORDER — PANTOPRAZOLE SODIUM 40 MG PO TBEC
40.0000 mg | DELAYED_RELEASE_TABLET | Freq: Every day | ORAL | Status: DC
  Administered 2023-10-06 – 2023-10-09 (×4): 40 mg via ORAL
  Filled 2023-10-06 (×4): qty 1

## 2023-10-06 MED ORDER — SODIUM CHLORIDE 0.9 % IV BOLUS
500.0000 mL | Freq: Once | INTRAVENOUS | Status: AC
  Administered 2023-10-06: 500 mL via INTRAVENOUS

## 2023-10-06 MED ORDER — ACETAMINOPHEN 650 MG RE SUPP: 650.0000 mg | Freq: Four times a day (QID) | RECTAL | Status: DC | PRN

## 2023-10-06 MED ORDER — SERTRALINE HCL 50 MG PO TABS
100.0000 mg | ORAL_TABLET | Freq: Every day | ORAL | Status: DC
  Administered 2023-10-06 – 2023-10-09 (×4): 100 mg via ORAL
  Filled 2023-10-06 (×4): qty 2

## 2023-10-06 NOTE — H&P (Addendum)
 History and Physical    Patient: Brittany Benton DOB: 1956-03-30 DOA: 10/05/2023 DOS: the patient was seen and examined on 10/06/2023 PCP: [name], MD  Patient coming from: Home  Chief Complaint:  Chief Complaint  Patient presents with   Weakness    HPI: Brittany Benton is a 68 y.o. female with medical history significant for Parkinson's disease , HTN and hypothyroidism, prior UTIs,  being admitted with possible UTI presenting with protracted weakness.  Patient's sister at the bedside contributes to history and states that patient has been declining for the past several weeks to months.  At baseline she ambulates with a walker and also sometimes uses a wheelchair.  Her husband has been becoming more frail and has been less able to help her with activities of daily living like getting to the bathroom.  She has had at least 15 falls in the past few months and rescue was called out each time to help her up.  Today after using the commode, she was unable to get up and rescue came out, and where she usually declines, today she agreed to be brought to the emergency room.  She denies having fallen on the day of arrival.  She denies fever, chills or abdominal pain. With EMS, BP was 99/45. In the ED, vitals were within normal limits, though BP intermittently soft as low as 102/53 and intermittent tachypnea to the mid 20s.  Labs notable for moderate leuks on UA but otherwise unremarkable with normal CBC and CMP..  EKG showed sinus rhythm at 78 with LVH.  CT head was nonacute showed generalized atrophy and an unchanged 8 mm meningioma. Patient treated with an NS bolus and Rocephin.  Admission requested     Review of Systems: As mentioned in the history of present illness. All other systems reviewed and are negative.  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   GERD (gastroesophageal reflux disease)    Hernia, incisional    after renal surgery   History of chicken pox    Hyperlipidemia    Hypertension    Parkinson disease (HCC)    Renal cell carcinoma (HCC)    s/p right nephrectomy 1994   Sleep apnea    Thyroid  disease    Past Surgical History:  Procedure Laterality Date   APPENDECTOMY  1994   BIOPSY  11/07/2022   Procedure: BIOPSY;  Surgeon: Therisa Bi, MD;  Location: [facility] ENDOSCOPY;  Service: Gastroenterology;;   BREAST BIOPSY Right 1991   Negative   CHOLECYSTECTOMY  1994   COLONOSCOPY WITH PROPOFOL  N/A 11/07/2022   Procedure: COLONOSCOPY WITH PROPOFOL ;  Surgeon: Therisa Bi, MD;  Location: [facility] ENDOSCOPY;  Service: Gastroenterology;  Laterality: N/A;   ESOPHAGOGASTRODUODENOSCOPY (EGD) WITH PROPOFOL  N/A 11/07/2022   Procedure: ESOPHAGOGASTRODUODENOSCOPY (EGD) WITH PROPOFOL ;  Surgeon: Therisa Bi, MD;  Location: [facility] ENDOSCOPY;  Service: Gastroenterology;  Laterality: N/A;   JOINT REPLACEMENT  2019   LAPAROSCOPIC GASTRIC SLEEVE RESECTION  02/02/2016   Dr Thedora at [facility]   NEPHRECTOMY  1994   Renal Cell Carcinoma   parotid gland removal  1990   also removed a tumor   POLYPECTOMY  11/07/2022   Procedure: POLYPECTOMY INTESTINAL;  Surgeon: Therisa Bi, MD;  Location: [facility] ENDOSCOPY;  Service: Gastroenterology;;   TONSILLECTOMY  TOTAL HIP ARTHROPLASTY Right 08/08/2016   Procedure: TOTAL HIP ARTHROPLASTY;  Surgeon: Kayla Pinal, MD;  Location: [facility] ORS;  Service: Orthopedics;  Laterality: Right;   TUBAL LIGATION  1979   Social History:  reports that she has never smoked. She has never used smokeless tobacco. She reports that she does not currently use alcohol. She reports that she does not use drugs.  Allergies  Allergen Reactions   Codeine Nausea And Vomiting and Other (See Comments)   Morphine Hives, Swelling and Other (See Comments)   Tape Other (See Comments)    blisters blisters    Tramadol  Other (See Comments)    Hallucinations    Family History  Problem Relation Age of Onset   Osteoporosis Mother    Dementia Mother    Anxiety disorder Mother    Heart disease Father    Heart attack Father    Breast cancer Maternal Aunt    Obesity Maternal Uncle     Prior to Admission medications   Medication Sig Start Date End Date Taking? Authorizing Provider  ALPRAZolam  (XANAX ) 0.25 MG tablet TAKE ONE TABLET (0.25 MG) BY MOUTH TWICEDAILY AS NEEDED FOR ANXIETY 09/17/23  Yes Gasper Nancyann BRAVO, MD  Ascorbic Acid (VITAMIN C PO) Take 1 tablet by mouth daily.   Yes [provider]  atorvastatin  (LIPITOR) 20 MG tablet TAKE 1 TABLET BY MOUTH EVERY EVENING 09/07/23  Yes Gasper Nancyann BRAVO, MD  Carbidopa-Levodopa ER (SINEMET CR) 25-100 MG tablet controlled release Take 1 tablet by mouth 2 (two) times daily.   Yes [name], MD  Cholecalciferol  (VITAMIN D -3) 1000 units CAPS Take 1 capsule by mouth daily.   Yes [provider]  imipramine  (TOFRANIL ) 50 MG tablet TAKE ONE TABLET BY MOUTH TWICE DAILY 01/22/23  Yes Gasper Nancyann BRAVO, MD  ketoconazole  (NIZORAL ) 2 % cream Apply 1 Application topically daily. Until rash resolves completely. 09/12/23  Yes Ostwalt, Janna, PA-C  levothyroxine  (SYNTHROID ) 88 MCG tablet Take 0.5 tablets (44 mcg total) by mouth daily. 08/27/23  Yes Pardue, [name], DO  metoprolol  succinate (TOPROL -XL) 50 MG 24 hr tablet TAKE ONE TABLET BY MOUTH ONCE DAILY WITHOR IMMEDIATELY FOLLOWING A MEAL 09/24/23  Yes Gasper Nancyann BRAVO, MD  omeprazole  (PRILOSEC) 20 MG capsule TAKE 1 CAPSULE BY MOUTH ONCE DAILY 09/16/23  Yes Gasper Nancyann BRAVO, MD  sertraline  (ZOLOFT ) 100 MG tablet TAKE ONE TABLET BY MOUTH DAILY 08/27/23  Yes Gasper Nancyann BRAVO, MD  fluconazole  (DIFLUCAN ) 150 MG tablet Take 150 mg by mouth once. Patient not taking: Reported on 10/06/2023 09/12/23   [provider]    Physical Exam: Vitals:   10/06/23 0000 10/06/23 0100 10/06/23 0200 10/06/23 0250   BP: (!) 127/104 (!) 148/122 (!) 102/53 (!) 110/54  Pulse:  78 83 76  Resp: (!) 21 (!) 27 17 16   Temp:   97.8 F (36.6 C) 98 F (36.7 C)  TempSrc:   Oral   SpO2:  95% 93% 98%  Weight:      Height:       Physical Exam Vitals and nursing note reviewed.  Constitutional:      General: She is not in acute distress. HENT:     Head: Normocephalic and atraumatic.   Cardiovascular:     Rate and Rhythm: Normal rate and regular rhythm.     Heart sounds: Normal heart sounds.  Pulmonary:     Effort: Pulmonary effort is normal.     Breath sounds: Normal breath sounds.  Abdominal:  Palpations: Abdomen is soft.     Tenderness: There is no abdominal tenderness.   Neurological:     Mental Status: Mental status is at baseline.     Labs on Admission: I have personally reviewed following labs and imaging studies  CBC: Recent Labs  Lab 10/05/23 2053  WBC 7.2  NEUTROABS 4.9  HGB 14.3  HCT 44.3  MCV 85.2  PLT 239   Basic Metabolic Panel: Recent Labs  Lab 10/05/23 2053  NA 136  K 3.6  CL 100  CO2 23  GLUCOSE 90  BUN 14  CREATININE 0.96  CALCIUM  8.9   GFR: Estimated Creatinine Clearance: 48.5 mL/min (by C-G formula based on SCr of 0.96 mg/dL). Liver Function Tests: Recent Labs  Lab 10/05/23 2053  AST 20  ALT 9  ALKPHOS 87  BILITOT 1.3*  PROT 6.9  ALBUMIN 3.6   No results for input(s): LIPASE, AMYLASE in the last 168 hours. No results for input(s): AMMONIA in the last 168 hours. Coagulation Profile: No results for input(s): INR, PROTIME in the last 168 hours. Cardiac Enzymes: No results for input(s): CKTOTAL, CKMB, CKMBINDEX, TROPONINI in the last 168 hours. BNP (last 3 results) No results for input(s): PROBNP in the last 8760 hours. HbA1C: No results for input(s): HGBA1C in the last 72 hours. CBG: No results for input(s): GLUCAP in the last 168 hours. Lipid Profile: No results for input(s): CHOL, HDL, LDLCALC, TRIG,  CHOLHDL, LDLDIRECT in the last 72 hours. Thyroid  Function Tests: Recent Labs    10/05/23 2053  TSH 2.073   Anemia Panel: No results for input(s): VITAMINB12, FOLATE, FERRITIN, TIBC, IRON, RETICCTPCT in the last 72 hours. Urine analysis:    Component Value Date/Time   COLORURINE AMBER (A) 10/05/2023 2300   APPEARANCEUR HAZY (A) 10/05/2023 2300   LABSPEC 1.027 10/05/2023 2300   PHURINE 5.0 10/05/2023 2300   GLUCOSEU NEGATIVE 10/05/2023 2300   HGBUR NEGATIVE 10/05/2023 2300   BILIRUBINUR NEGATIVE 10/05/2023 2300   BILIRUBINUR negative 03/30/2022 0833   KETONESUR 20 (A) 10/05/2023 2300   PROTEINUR 30 (A) 10/05/2023 2300   UROBILINOGEN 0.2 03/30/2022 0833   NITRITE NEGATIVE 10/05/2023 2300   LEUKOCYTESUR MODERATE (A) 10/05/2023 2300    Radiological Exams on Admission: CT Head Wo Contrast Result Date: 10/05/2023 CLINICAL DATA:  Weakness and altered mental status EXAM: CT HEAD WITHOUT CONTRAST TECHNIQUE: Contiguous axial images were obtained from the base of the skull through the vertex without intravenous contrast. RADIATION DOSE REDUCTION: This exam was performed according to the departmental dose-optimization program which includes automated exposure control, adjustment of the mA and/or kV according to patient size and/or use of iterative reconstruction technique. COMPARISON:  None Available. FINDINGS: Brain: There is no hemorrhage or extra-axial collection. There is generalized atrophy without lobar predilection. Hypodensity of the white matter is most commonly associated with chronic microvascular disease. 8 mm left convexity meningioma, unchanged. Vascular: No hyperdense vessel or unexpected vascular calcification. Skull: The visualized skull base, calvarium and extracranial soft tissues are normal. Sinuses/Orbits: No fluid levels or advanced mucosal thickening of the visualized paranasal sinuses. No mastoid or middle ear effusion. Normal orbits. Other: None. IMPRESSION:  1. No acute intracranial abnormality. 2. Generalized atrophy and findings of chronic microvascular disease. 3. Unchanged 8 mm left convexity meningioma. Electronically Signed   By: Franky Stanford M.D.   On: 10/05/2023 22:26   Data Reviewed for HPI: Relevant notes from primary care and specialist visits, past discharge summaries as available in EHR, including Care Everywhere. Prior  diagnostic testing as pertinent to current admission diagnoses Updated medications and problem lists for reconciliation ED course, including vitals, labs, imaging, treatment and response to treatment Triage notes, nursing and pharmacy notes and ED provider's notes Notable results as noted above in HPI      Assessment and Plan: * Generalized weakness Frequent falls Likely multifactorial.  Possible UTI as well as progressive degenerative changes related to Parkinson's disease Patient is having difficulty with ADLs Frequent falls with frequent calls to rescue to help her up PT OT and TOC consult Might benefit from short-term rehab-discussed with patient and sister at bedside.  Also spoke about home health, which patient has had before but did not benefit her.  Discussed hospice care  Urinary tract infection Continue Rocephin Follow cultures   Hypotension Essential hypertension BP with EMS was 99/45, 102/53 following fluid bolus in the ED Hold home metoprolol  Continue to monitor Will give a bit more fluids  Parkinson's disease (HCC) Generalized weakness Frequent falls.  Ambulates with cane at baseline Continue Sinemet Patient is followed by neurologist, Dr. Leetta visit 07/2023, noted progression of symptoms.  Sinemet was uptitrated but patient unable to tolerate higher dose  Hypothyroidism Continue levothyroxine   Depression Continue sertraline  and imipramine    OSA (obstructive sleep apnea) Currently does not use CPAP      DVT prophylaxis: Lovenox   Consults: none  Advance Care  Planning:   Code Status: Prior   Family Communication: sister at bedside  Disposition Plan: Back to previous home environment  Severity of Illness: The appropriate patient status for this patient is OBSERVATION. Observation status is judged to be reasonable and necessary in order to provide the required intensity of service to ensure the patient's safety. The patient's presenting symptoms, physical exam findings, and initial radiographic and laboratory data in the context of their medical condition is felt to place them at decreased risk for further clinical deterioration. Furthermore, it is anticipated that the patient will be medically stable for discharge from the hospital within 2 midnights of admission.   Author: Delayne LULLA Solian, MD 10/06/2023 2:50 AM  For on call review [email or web address].

## 2023-10-06 NOTE — Evaluation (Addendum)
 Occupational Therapy Evaluation Patient Details Name: Brittany Benton MRN: [id number] DOB: 1955-05-12 Today's Date: 10/06/2023   History of Present Illness   Pt is a 68 year old female admitted with frequent falls with possible UTI as well as degenerative changes related to Parkinson's disease, orthostatic hypotension   PMH significant for Parkinson's disease , HTN and hypothyroidism, prior UTIs    Clinical Impressions Chart reviewed to date, pt greeted in bed after being transferred back to bed via +2-3 heavy assist per nurse/NT. Pt is oriented to self and place, husband reports she is not at her current cognitive baseline although it does wax and wane. Pt does require some assist for ADL/IADL PTA, amb with AD but per pt/husband report is performing ADL/functional mobility below baseline and has had multiple falls. MOD-MAX A required for rolling, TOTAL A for toileting in bed. Pt will benefit from acute OT to address functional deficits and to facilitate optimal safe ADL/functional mobility performance. Pt is left in bed, all needs met, husband present. OT will continue to follow.      If plan is discharge home, recommend the following:   Two people to help with walking and/or transfers;Two people to help with bathing/dressing/bathroom;Supervision due to cognitive status     Functional Status Assessment   Patient has had a recent decline in their functional status and demonstrates the ability to make significant improvements in function in a reasonable and predictable amount of time.     Equipment Recommendations   Other (comment) (defer to next venue of care)     Recommendations for Other Services         Precautions/Restrictions   Precautions Precautions: Fall Recall of Precautions/Restrictions: Impaired     Mobility Bed Mobility Overal bed mobility: Needs Assistance Bed Mobility: Rolling Rolling: Mod assist, Max assist, Used rails         General bed  mobility comments: Pt required MAX A +2 per nuse/tech prior to OT entering room    Transfers                   General transfer comment: unable to perform STS per nurse/NT      Balance                                           ADL either performed or assessed with clinical judgement   ADL Overall ADL's : Needs assistance/impaired                 Upper Body Dressing : Moderate assistance;Bed level Upper Body Dressing Details (indicate cue type and reason): anticipate Lower Body Dressing: Maximal assistance;Total assistance;Bed level       Toileting- Clothing Manipulation and Hygiene: Maximal assistance;Total assistance;Bed level Toileting - Clothing Manipulation Details (indicate cue type and reason): assist onto bed pan             Vision Baseline Vision/History: 1 Wears glasses Patient Visual Report: No change from baseline       Perception         Praxis         Pertinent Vitals/Pain Pain Assessment Pain Assessment: No/denies pain     Extremity/Trunk Assessment Upper Extremity Assessment Upper Extremity Assessment: Generalized weakness   Lower Extremity Assessment Lower Extremity Assessment: Generalized weakness       Communication Communication Communication: No apparent difficulties   Cognition Arousal: Alert  Behavior During Therapy: WFL for tasks assessed/performed Cognition: Cognition impaired   Orientation impairments: Situation   Memory impairment (select all impairments): Declarative long-term memory Attention impairment (select first level of impairment): Sustained attention Executive functioning impairment (select all impairments): Reasoning, Problem solving OT - Cognition Comments: pt husabnd cognition waxes and wanes but current level of cognition is not typical baseline                 Following commands: Impaired Following commands impaired: Follows one step commands with increased time      Cueing  General Comments   Cueing Techniques: Verbal cues;Tactile cues;Visual cues  messaged MD re: husband report of poor PO intake   Exercises Other Exercises Other Exercises: edu pt/husband re: role of OT, role of rehab, discharge recommendations, safe ADL completion   Shoulder Instructions      Home Living Family/patient expects to be discharged to:: Private residence Living Arrangements: Spouse/significant other Available Help at Discharge: Family Type of Home: House Home Access: Stairs to enter;Ramped entrance   Entrance Stairs-Rails: Right Home Layout: Multi-level Alternate Level Stairs-Number of Steps: split level home; stairs to enter;       Bathroom Toilet: Handicapped height     Home Equipment: Rollator (4 wheels);Wheelchair - Manufacturing systems engineer          Prior Functioning/Environment Prior Level of Function : Needs assist;History of Falls (last six months)             Mobility Comments: amb with AD household distances, mwc for community distances (pushed by husband) ADLs Comments: some assist for ADLs, but pt reports she feeds/performs grooming with MOD I-I, assist for IADLs    OT Problem List: Decreased activity tolerance;Decreased knowledge of use of DME or AE;Decreased safety awareness;Impaired balance (sitting and/or standing)   OT Treatment/Interventions: Self-care/ADL training;DME and/or AE instruction;Therapeutic activities;Balance training;Therapeutic exercise;Patient/family education;Energy conservation      OT Goals(Current goals can be found in the care plan section)   Acute Rehab OT Goals Patient Stated Goal: rehab OT Goal Formulation: With patient/family Time For Goal Achievement: 10/20/23 Potential to Achieve Goals: Good ADL Goals Pt Will Perform Grooming: with supervision;sitting Pt Will Perform Lower Body Dressing: with mod assist Pt Will Transfer to Toilet: with mod assist;stand pivot transfer Pt Will Perform Toileting -  Clothing Manipulation and hygiene: with mod assist   OT Frequency:  Min 2X/week    Co-evaluation              AM-PAC OT 6 Clicks Daily Activity     Outcome Measure Help from another person eating meals?: A Little Help from another person taking care of personal grooming?: A Little Help from another person toileting, which includes using toliet, bedpan, or urinal?: Total Help from another person bathing (including washing, rinsing, drying)?: A Lot Help from another person to put on and taking off regular upper body clothing?: A Lot Help from another person to put on and taking off regular lower body clothing?: A Lot 6 Click Score: 13   End of Session Nurse Communication: Mobility status  Activity Tolerance: Patient limited by fatigue Patient left: in bed;with call Kilian/phone within reach;with bed alarm set;with family/visitor present  OT Visit Diagnosis: Other abnormalities of gait and mobility (R26.89);Muscle weakness (generalized) (M62.81)                Time: 8596-8579 OT Time Calculation (min): 17 min Charges:  OT General Charges $OT Visit: 1 Visit OT Evaluation $OT Eval Moderate Complexity: 1 Mod  Therisa Sheffield, OTD OTR/L  10/06/23, 2:34 PM

## 2023-10-06 NOTE — Evaluation (Signed)
 Physical Therapy Evaluation Patient Details Name: Brittany Benton MRN: [id number] DOB: [birth year] Today's Date: 10/06/2023  History of Present Illness  Brittany Benton is a 68yoF who comes to [facility] on 10/05/23 c acute onset weakness and worsening of parkinson's symptoms. Spouse reports he is unable to provide the care that pt needs. PMH: Parkinson's disease, HTN hypoTSH, UTI. Baseline mobility include RW or WC use for household AMB, reportedly 15 recent falls at home.  Clinical Impression  Pt on bed pan on arrival, no success. Pt agreeable to PT assessment, interested in attempting transfer to [facility]. Pt is unable to achieve a steady standing position despite 4 attempts, during pivot transfers has buckling midway and then becomes anxiety filled and no longer able to follow commands in a safe capacity. Pt unable to void bowel x10 minutes on BSC. Pt assisted back to recliner, waiting on lunch tray. RN given updates. MD made aware of orthostatic vitals. Will continue to follow. Sounds like pt has continues to decline physically since transition from OPPT to HHPT 1 month prior to admission- could consider STR to achieve a higher frequency level. Pt not safe to return to home without extensive equipment and routine modifications.   Orthostatic VS for the past 24 hrs:  BP- Lying Pulse- Lying BP- Sitting Pulse- Sitting BP- Standing at 0 minutes Pulse- Standing at 0 minutes  10/06/23 1200 108/43 75 101/51 69 (!) 60/40 72          If plan is discharge home, recommend the following: A lot of help with bathing/dressing/bathroom;Assist for transportation;Help with stairs or ramp for entrance;Assistance with cooking/housework   Can travel by private vehicle   No    Equipment Recommendations  (to safely return to home, pt would need a variety of additional equipment, however husband could not manage a hoyer lift by himself.)  Recommendations for Other Services       Functional Status Assessment Patient has had  a recent decline in their functional status and demonstrates the ability to make significant improvements in function in a reasonable and predictable amount of time.     Precautions / Restrictions Precautions Precautions: Fall Restrictions Weight Bearing Restrictions Per Provider Order: No      Mobility  Bed Mobility Overal bed mobility: Needs Assistance Bed Mobility: Supine to Sit     Supine to sit: Mod assist     General bed mobility comments: global weakness, but near baseline    Transfers Overall transfer level: Needs assistance Equipment used: Rolling walker (2 wheels) Transfers: Sit to/from Stand, Bed to chair/wheelchair/BSC Sit to Stand: Mod assist   Step pivot transfers: Total assist (has buckling halfway, then goes into panic attack, unable to follow cues at that point (happens twice))            Ambulation/Gait Ambulation/Gait assistance:  (unsafe to attempt)                Stairs            Wheelchair Mobility     Tilt Bed    Modified Rankin (Stroke Patients Only)       Balance                                             Pertinent Vitals/Pain Pain Assessment Pain Assessment: No/denies pain    Home Living Family/patient expects to be discharged to:: Private  residence Living Arrangements: Spouse/significant other Available Help at Discharge: Family Type of Home: House Home Access: Stairs to enter;Ramped entrance Entrance Stairs-Rails: Right   Alternate Level Stairs-Number of Steps: split level home; stairs to enter; Home Layout: Multi-level Home Equipment: Rollator (4 wheels);Wheelchair - Manufacturing systems engineer      Prior Function                       Extremity/Trunk Assessment                Communication        Cognition Arousal: Alert Behavior During Therapy: WFL for tasks assessed/performed   PT - Cognitive impairments: History of cognitive impairments, Memory                                  Cueing       General Comments      Exercises     Assessment/Plan    PT Assessment Patient needs continued PT services  PT Problem List Decreased strength;Decreased activity tolerance;Decreased balance;Decreased mobility;Decreased range of motion;Decreased knowledge of use of DME;Decreased safety awareness;Decreased knowledge of precautions       PT Treatment Interventions DME instruction;Stair training;Functional mobility training;Therapeutic activities;Therapeutic exercise;Balance training;Patient/family education    PT Goals (Current goals can be found in the Care Plan section)  Acute Rehab PT Goals Patient Stated Goal: stop falling PT Goal Formulation: With patient Time For Goal Achievement: 10/20/23 Potential to Achieve Goals: Good    Frequency Min 1X/week     Co-evaluation               AM-PAC PT 6 Clicks Mobility  Outcome Measure Help needed turning from your back to your side while in a flat bed without using bedrails?: A Little Help needed moving from lying on your back to sitting on the side of a flat bed without using bedrails?: A Little Help needed moving to and from a bed to a chair (including a wheelchair)?: A Lot Help needed standing up from a chair using your arms (e.g., wheelchair or bedside chair)?: A Lot Help needed to walk in hospital room?: Total Help needed climbing 3-5 steps with a railing? : Total 6 Click Score: 12    End of Session   Activity Tolerance: Patient tolerated treatment well;Patient limited by fatigue;Other (comment) (panic attacks mid transfer (not able to follow cues thereafter)) Patient left: in chair;with family/visitor present;with call [place]/phone within reach;with chair alarm set Nurse Communication: Mobility status PT Visit Diagnosis: Unsteadiness on feet (R26.81);Difficulty in walking, not elsewhere classified (R26.2);Repeated falls (R29.6)    Time: 8866-8784 PT Time Calculation (min)  (ACUTE ONLY): 42 min   Charges:   PT Evaluation $PT Eval Moderate Complexity: 1 Mod PT Treatments $Therapeutic Activity: 8-22 mins PT General Charges $$ ACUTE PT VISIT: 1 Visit    1:05 PM, 10/06/23 Peggye JAYSON Linear, PT, DPT Physical Therapist - [facility]  [phone] (ASCOM)    Alexyia Guarino C 10/06/2023, 12:58 PM

## 2023-10-06 NOTE — Assessment & Plan Note (Signed)
 Frequent falls Likely multifactorial.  Possible UTI as well as progressive degenerative changes related to Parkinson's disease Patient is having difficulty with ADLs Frequent falls with frequent calls to rescue to help her up PT OT and TOC consult Might benefit from short-term rehab-discussed with patient and sister at bedside.  Also spoke about home health, which patient has had before but did not benefit her.  Discussed hospice care

## 2023-10-06 NOTE — Assessment & Plan Note (Signed)
 Continue levothyroxine 

## 2023-10-06 NOTE — Assessment & Plan Note (Signed)
 Essential hypertension BP with EMS was 99/45, 102/53 following fluid bolus in the ED Hold home metoprolol  Continue to monitor Will give a bit more fluids

## 2023-10-06 NOTE — Hospital Course (Addendum)
 SABRA

## 2023-10-06 NOTE — Assessment & Plan Note (Addendum)
Continue Rocephin Follow cultures 

## 2023-10-06 NOTE — ED Provider Notes (Signed)
 1:00 AM  Assumed care at shift change.  Patient here with history of Parkinson's with multiple recent falls, increased generalized weakness today.  CT head reviewed and interpreted by myself and radiologist as unremarkable.  Labs normal.  Urine does show moderate leuks, white blood cells, white blood cell clumps.  She feels like symptoms are similar when she has had a previous UTI.  Will add on culture, give Rocephin and discussed with the hospitalist for admission for hydration, antibiotics, PT/OT and potential placement.  Consulted and discussed patient's case with hospitalist, Dr. Cleatus.  I have recommended admission and consulting physician agrees and will place admission orders.  Patient (and family if present) agree with this plan.   I reviewed all nursing notes, vitals, pertinent previous records.  All labs, EKGs, imaging ordered have been independently reviewed and interpreted by myself.    Brittany Benton, Brittany SAILOR, DO 10/06/23 [phone]

## 2023-10-06 NOTE — Progress Notes (Signed)
 Anticoagulation monitoring(Lovenox ):  68 yo female ordered Lovenox  40 mg Q24h    Filed Weights   10/05/23 2056  Weight: 72.6 kg (160 lb)   BMI 31.3   Lab Results  Component Value Date   CREATININE 0.96 10/05/2023   CREATININE 0.89 07/23/2023   CREATININE 0.82 09/05/2021   Estimated Creatinine Clearance: 48.5 mL/min (by C-G formula based on SCr of 0.96 mg/dL). Hemoglobin & Hematocrit     Component Value Date/Time   HGB 14.3 10/05/2023 2053   HGB 14.7 07/23/2023 1429   HCT 44.3 10/05/2023 2053   HCT 46.1 07/23/2023 1429     Per Protocol for Patient with estCrcl > 30 ml/min and BMI > 30, will transition to Lovenox  37.5 mg Q24H.

## 2023-10-06 NOTE — Progress Notes (Addendum)
 [facility] Progress Note  Patient: Brittany Benton    FMW:[id number]  DOA: 10/05/2023     Date of Service: the patient was seen and examined on 10/06/2023  Chief Complaint  Patient presents with   Weakness   Brief hospital course: SHERIDAN HEW is a 69 y.o. female with medical history significant for Parkinson's disease , HTN and hypothyroidism, prior UTIs,  being admitted with possible UTI presenting with protracted weakness.   ED workup: BP intermittently soft as low as 102/53 and intermittent tachypnea to the mid 20s.  Labs notable for moderate leuks on UA but otherwise unremarkable with normal CBC and CMP..  EKG showed sinus rhythm at 78 with LVH.  CT head was nonacute showed generalized atrophy and an unchanged 8 mm meningioma. Patient treated with an NS bolus and Rocephin.  Admission requested    Assessment and Plan:  Generalized weakness Frequent falls Likely multifactorial.  Possible UTI as well as progressive degenerative changes related to Parkinson's disease Patient is having difficulty with ADLs Frequent falls with frequent calls to rescue to help her up PT OT and TOC consult Might benefit from short-term rehab-discussed with patient and sister at bedside.  Also spoke about home health, which patient has had before but did not benefit her.  Discussed hospice care   Urinary tract infection Continue Rocephin Follow cultures   # Orthostatic hypotension Hypotension H/o Essential hypertension BP with EMS was 99/45, 102/53 following fluid bolus in the ED Hold home metoprolol  Continue to monitor S/p IVF  6/22 NS bolus 500 mL and started midodrine 5 mg p.o. 3 times daily with holding parameters. Check orthostatic every shift Monitor BP and titrate medications accordingly   Parkinson's disease (HCC) Generalized weakness Frequent falls.  Ambulates with cane at baseline Continue Sinemet Patient is followed by neurologist, Dr. Leetta visit 07/2023, noted  progression of symptoms.  Sinemet was uptitrated but patient unable to tolerate higher dose   Hypothyroidism Continue levothyroxine    Depression Continue sertraline  and imipramine    Hypokalemia, potassium repleted. Monitor electrolytes and replete   OSA (obstructive sleep apnea) Currently does not use CPAP  Obesity, class I Body mass index is 31.25 kg/m.  Interventions:  Diet: Heart healthy diet DVT Prophylaxis: Subcutaneous Lovenox    Advance goals of care discussion: Full code  Family Communication: family was present at bedside, at the time of interview.  The pt provided permission to discuss medical plan with the family. Opportunity was given to ask question and all questions were answered satisfactorily.   Disposition:  Pt is from Home, admitted with UTI, generalized weakness and frequent falls, still has risk of fall and readmission, which precludes a safe discharge. Discharge to SNF, TBD after PT and OT eval, when stable.  Subjective: No significant events overnight, patient resting comfortably, just feels generalized weakness and presented with frequent falls. Awaiting to be seen by PT and OT  Physical Exam: General: NAD, lying comfortably Appear in no distress, affect appropriate Eyes: PERRLA ENT: Oral Mucosa Clear, moist  Neck: no JVD,  Cardiovascular: S1 and S2 Present, no Murmur,  Respiratory: good respiratory effort, Bilateral Air entry equal and Decreased, no Crackles, no wheezes Abdomen: Bowel Sound present, Soft and no tenderness,  Skin: no rashes Extremities: no Pedal edema, no calf tenderness Neurologic: without any new focal findings Gait not checked due to patient safety concerns  Vitals:   10/06/23 0200 10/06/23 0250 10/06/23 0548 10/06/23 0738  BP: (!) 102/53 (!) 110/54 (!) 102/46 (!) 117/59  Pulse:  83 76 66 72  Resp: 17 16 16 18   Temp: 97.8 F (36.6 C) 98 F (36.7 C) 98.4 F (36.9 C) 98 F (36.7 C)  TempSrc: Oral  Oral Oral  SpO2: 93%  98% 96% 97%  Weight:      Height:        Intake/Output Summary (Last 24 hours) at 10/06/2023 1211 Last data filed at 10/06/2023 1042 Gross per 24 hour  Intake 240 ml  Output --  Net 240 ml   Filed Weights   10/05/23 2056  Weight: 72.6 kg    Data Reviewed: I have personally reviewed and interpreted daily labs, tele strips, imagings as discussed above. I reviewed all nursing notes, pharmacy notes, vitals, pertinent old records I have discussed plan of care as described above with RN and patient/family.  CBC: Recent Labs  Lab 10/05/23 2053  WBC 7.2  NEUTROABS 4.9  HGB 14.3  HCT 44.3  MCV 85.2  PLT 239   Basic Metabolic Panel: Recent Labs  Lab 10/05/23 2053 10/06/23 0320  NA 136 139  K 3.6 3.2*  CL 100 105  CO2 23 26  GLUCOSE 90 86  BUN 14 11  CREATININE 0.96 0.84  CALCIUM  8.9 7.9*  MG  --  1.7  PHOS  --  3.1    Studies: CT Head Wo Contrast Result Date: 10/05/2023 CLINICAL DATA:  Weakness and altered mental status EXAM: CT HEAD WITHOUT CONTRAST TECHNIQUE: Contiguous axial images were obtained from the base of the skull through the vertex without intravenous contrast. RADIATION DOSE REDUCTION: This exam was performed according to the departmental dose-optimization program which includes automated exposure control, adjustment of the mA and/or kV according to patient size and/or use of iterative reconstruction technique. COMPARISON:  None Available. FINDINGS: Brain: There is no hemorrhage or extra-axial collection. There is generalized atrophy without lobar predilection. Hypodensity of the white matter is most commonly associated with chronic microvascular disease. 8 mm left convexity meningioma, unchanged. Vascular: No hyperdense vessel or unexpected vascular calcification. Skull: The visualized skull base, calvarium and extracranial soft tissues are normal. Sinuses/Orbits: No fluid levels or advanced mucosal thickening of the visualized paranasal sinuses. No mastoid or  middle ear effusion. Normal orbits. Other: None. IMPRESSION: 1. No acute intracranial abnormality. 2. Generalized atrophy and findings of chronic microvascular disease. 3. Unchanged 8 mm left convexity meningioma. Electronically Signed   By: Franky Stanford M.D.   On: 10/05/2023 22:26    Scheduled Meds:  atorvastatin   20 mg Oral QPM   Carbidopa-Levodopa ER  1 tablet Oral BID   enoxaparin  (LOVENOX ) injection  0.5 mg/kg Subcutaneous Q24H   feeding supplement  237 mL Oral TID BM   imipramine   50 mg Oral BID   levothyroxine   44 mcg Oral Q0600   pantoprazole   40 mg Oral Daily   potassium chloride   40 mEq Oral Q4H   sertraline   100 mg Oral Daily   Continuous Infusions:  [START ON 10/07/2023] cefTRIAXone (ROCEPHIN)  IV     PRN Meds: acetaminophen  **OR** acetaminophen , HYDROcodone -acetaminophen , ondansetron  **OR** ondansetron  (ZOFRAN ) IV  Time spent: 35 minutes  Author: ELVAN SOR. MD [facility] 10/06/2023 12:11 PM  To reach On-call, see care teams to locate the attending and reach out to them via [email or web address]. If 7PM-7AM, please contact night-coverage If you still have difficulty reaching the attending provider, please page the [facility] (Director on Call) for [facility] on amion for assistance.

## 2023-10-06 NOTE — Assessment & Plan Note (Signed)
Currently does not use CPAP.

## 2023-10-06 NOTE — Assessment & Plan Note (Addendum)
 Generalized weakness Frequent falls.  Ambulates with cane at baseline Continue Sinemet Patient is followed by neurologist, Dr. Leetta visit 07/2023, noted progression of symptoms.  Sinemet was uptitrated but patient unable to tolerate higher dose

## 2023-10-06 NOTE — ED Provider Notes (Signed)
 [facility] Provider Note    Event Date/Time   First MD Initiated Contact with Patient 10/05/23 2046     (approximate)   History   Weakness   HPI  Brittany Benton is a 68 y.o. female past medical history significant for Parkinson's disease, depression, panic attacks, hypothyroidism, presents to the emergency department with weakness.  History is provided by the patient and her husband at bedside.  States that over the past 2 months she has had progressively worsening weakness.  Today had significant difficulties getting off the toilet and was unable to get up.  No falls or head trauma.  No new medication changes.  States the last time they were evaluated by her neurologist told she had progression of her Parkinson's.  Has followed up with her primary care physician.  Denies chest pain, shortness of breath.  Does endorse significant decreased oral intake and weight loss.  Denies headache or change in vision.  No abdominal pain, nausea vomiting or diarrhea.  No dysuria, urinary urgency or frequency.     Physical Exam   Triage Vital Signs: ED Triage Vitals  Encounter Vitals Group     BP 10/05/23 2055 (!) 115/59     Girls Systolic BP Percentile --      Girls Diastolic BP Percentile --      Boys Systolic BP Percentile --      Boys Diastolic BP Percentile --      Pulse Rate 10/05/23 2055 78     Resp 10/05/23 2055 16     Temp 10/05/23 2055 98.6 F (37 C)     Temp Source 10/05/23 2055 Axillary     SpO2 10/05/23 2046 97 %     Weight 10/05/23 2056 160 lb (72.6 kg)     Height 10/05/23 2056 5' (1.524 m)     Head Circumference --      Peak Flow --      Pain Score 10/05/23 2056 0     Pain Loc --      Pain Education --      Exclude from Growth Chart --     Most recent vital signs: Vitals:   10/05/23 2055 10/05/23 2300  BP: (!) 115/59 (!) 107/49  Pulse: 78   Resp: 16 (!) 24  Temp: 98.6 F (37 C)   SpO2: 95%     Physical Exam Constitutional:       Appearance: She is well-developed.  HENT:     Head: Atraumatic.   Eyes:     Conjunctiva/sclera: Conjunctivae normal.    Cardiovascular:     Rate and Rhythm: Regular rhythm.  Pulmonary:     Effort: No respiratory distress.  Abdominal:     General: There is no distension.     Tenderness: There is no abdominal tenderness.   Musculoskeletal:        General: Normal range of motion.     Cervical back: Normal range of motion.   Skin:    General: Skin is warm.   Neurological:     Mental Status: She is alert. Mental status is at baseline.     IMPRESSION / MDM / ASSESSMENT AND PLAN / ED COURSE  I reviewed the triage vital signs and the nursing notes.  Differential diagnosis including anemia, electrolyte abnormality, dehydration, progression of Parkinson's disease  EKG  I, Clotilda Punter, the attending physician, personally viewed and interpreted this ECG.   Rate: Normal  Rhythm: Normal sinus  Axis: Normal  Intervals: Normal  ST&T Change: None  No tachycardic or bradycardic dysrhythmias while on cardiac telemetry.  RADIOLOGY  CTs of the head with no acute findings.  Unchanged meningioma.   LABS (all labs ordered are listed, but only abnormal results are displayed) Labs interpreted as -    Labs Reviewed  CBC WITH DIFFERENTIAL/PLATELET - Abnormal; Notable for the following components:      Result Value   RBC 5.20 (*)    All other components within normal limits  COMPREHENSIVE METABOLIC PANEL WITH GFR - Abnormal; Notable for the following components:   Total Bilirubin 1.3 (*)    All other components within normal limits  URINALYSIS, W/ REFLEX TO CULTURE (INFECTION SUSPECTED) - Abnormal; Notable for the following components:   Color, Urine AMBER (*)    APPearance HAZY (*)    Ketones, ur 20 (*)    Protein, ur 30 (*)    Leukocytes,Ua MODERATE (*)    All other components within normal limits  URINE CULTURE  TSH  TROPONIN I (HIGH SENSITIVITY)  TROPONIN I (HIGH  SENSITIVITY)     MDM  Patient without significant lab work abnormality.  No significant leukocytosis or anemia.  Creatinine is at her baseline.  Troponin is negative.  Thyroid  studies within normal limits.  Urine with no obvious findings of urinary tract infection does have some WBCs but patient is not symptomatic at this time I do not believe that this is causing her weakness for the past 2 months.  Discussed at length with the patient and her husband at bedside whether they would want social work with PT and OT evaluation in the morning for possible placement.  She is still uncertain if she wants to go back home or if she wants to stay for social work evaluation.     PROCEDURES:  Critical Care performed: No  Procedures  Patient's presentation is most consistent with acute complicated illness / injury requiring diagnostic workup.   MEDICATIONS ORDERED IN ED: Medications - No data to display  FINAL CLINICAL IMPRESSION(S) / ED DIAGNOSES   Final diagnoses:  None     Rx / DC Orders   ED Discharge Orders     None        Note:  This document was prepared using Dragon voice recognition software and may include unintentional dictation errors.   Suzanne Kirsch, MD 10/06/23 [phone]

## 2023-10-06 NOTE — Assessment & Plan Note (Signed)
 Continue sertraline  and imipramine 

## 2023-10-07 ENCOUNTER — Encounter: Payer: Self-pay | Admitting: Student

## 2023-10-07 DIAGNOSIS — R531 Weakness: Secondary | ICD-10-CM

## 2023-10-07 LAB — BASIC METABOLIC PANEL WITH GFR
Anion gap: 8 (ref 5–15)
BUN: 8 mg/dL (ref 8–23)
CO2: 23 mmol/L (ref 22–32)
Calcium: 8.6 mg/dL — ABNORMAL LOW (ref 8.9–10.3)
Chloride: 107 mmol/L (ref 98–111)
Creatinine, Ser: 0.78 mg/dL (ref 0.44–1.00)
GFR, Estimated: >60 mL/min (ref >60)
Glucose, Bld: 93 mg/dL (ref 70–99)
Potassium: 5 mmol/L (ref 3.5–5.1)
Sodium: 138 mmol/L (ref 135–145)

## 2023-10-07 LAB — CBC
HCT: 39.3 % (ref 36.0–46.0)
Hemoglobin: 12.5 g/dL (ref 12.0–15.0)
MCH: 27.5 pg (ref 26.0–34.0)
MCHC: 31.8 g/dL (ref 30.0–36.0)
MCV: 86.6 fL (ref 80.0–100.0)
Platelets: 186 10*3/uL (ref 150–400)
RBC: 4.54 MIL/uL (ref 3.87–5.11)
RDW: 13.8 % (ref 11.5–15.5)
WBC: 4.7 10*3/uL (ref 4.0–10.5)
nRBC: 0 % (ref 0.0–0.2)

## 2023-10-07 LAB — URINE CULTURE: Culture: <10000 — AB

## 2023-10-07 LAB — MAGNESIUM: Magnesium: 2.2 mg/dL (ref 1.7–2.4)

## 2023-10-07 LAB — PHOSPHORUS: Phosphorus: 2.8 mg/dL (ref 2.5–4.6)

## 2023-10-07 MED ORDER — ADULT MULTIVITAMIN W/MINERALS CH
1.0000 | ORAL_TABLET | Freq: Two times a day (BID) | ORAL | Status: DC
  Administered 2023-10-08: 1
  Filled 2023-10-07: qty 1

## 2023-10-07 MED ORDER — SODIUM CHLORIDE 0.9 % IV SOLN
1.0000 g | INTRAVENOUS | Status: AC
  Administered 2023-10-07: 1 g via INTRAVENOUS
  Filled 2023-10-07: qty 10

## 2023-10-07 MED ORDER — ADULT MULTIVITAMIN W/MINERALS CH
1.0000 | ORAL_TABLET | Freq: Every day | ORAL | Status: DC
  Administered 2023-10-07: 1
  Filled 2023-10-07: qty 1

## 2023-10-07 MED ORDER — BOOST / RESOURCE BREEZE PO LIQD CUSTOM
1.0000 | Freq: Three times a day (TID) | ORAL | Status: DC
  Administered 2023-10-08: 1 mL via ORAL

## 2023-10-07 MED ORDER — ALPRAZOLAM 0.5 MG PO TABS
0.5000 mg | ORAL_TABLET | Freq: Three times a day (TID) | ORAL | Status: DC | PRN
  Administered 2023-10-07: 0.5 mg via ORAL
  Filled 2023-10-07 (×2): qty 1

## 2023-10-07 MED ORDER — CALCIUM CARBONATE ANTACID 500 MG PO CHEW
1.0000 | CHEWABLE_TABLET | Freq: Three times a day (TID) | ORAL | Status: DC
  Administered 2023-10-09: 200 mg via ORAL
  Filled 2023-10-07 (×2): qty 1

## 2023-10-07 MED ORDER — SODIUM CHLORIDE 0.9 % IV SOLN
1.0000 g | INTRAVENOUS | Status: DC
  Filled 2023-10-07: qty 10

## 2023-10-07 NOTE — TOC Initial Note (Signed)
 Transition of Care [facility]) - Initial/Assessment Note    Patient Details  Name: Brittany Benton MRN: [id number] Date of Birth: [birth year]  Transition of Care [facility]) CM/SW Contact:    Dalia GORMAN Fuse, RN Phone Number: 10/07/2023, 4:06 PM  Clinical Narrative:                 TOC spoke with the patient and her sister at the bedside. The patient is from home with her husband. She is on the 2nd floor of a split level home and can only get downstairs with the assistance of her husband. She reports that she has a walker, wheelchair, raised toilet, modified shower with grab bars. Her husband does the driving. Therapy is recommending SNF. The patient's choices are [facility], [facility], and [facility] in that order.     Barriers to Discharge: Continued Medical Work up   Patient Goals and CMS Choice            Expected Discharge Plan and Services       Living arrangements for the past 2 months: Single Family Home                                      Prior Living Arrangements/Services Living arrangements for the past 2 months: Single Family Home Lives with:: Spouse              Current home services: DME (walker, raised commode seat with grab bars, wheelchair, shower bench, and grab bars in the shower)    Activities of Daily Living   ADL Screening (condition at time of admission) Independently performs ADLs?: No Does the patient have a NEW difficulty with bathing/dressing/toileting/self-feeding that is expected to last >3 days?: No Does the patient have a NEW difficulty with getting in/out of bed, walking, or climbing stairs that is expected to last >3 days?: Yes (Initiates electronic notice to provider for possible PT consult) Does the patient have a NEW difficulty with communication that is expected to last >3 days?: No Is the patient deaf or have difficulty hearing?: No Does the patient have difficulty seeing, even when wearing glasses/contacts?: No Does the patient  have difficulty concentrating, remembering, or making decisions?: No  Permission Sought/Granted                  Emotional Assessment Appearance:: Appears stated age Attitude/Demeanor/Rapport: Engaged Affect (typically observed): Calm Orientation: : Oriented to Self, Oriented to Place, Oriented to  Time, Oriented to Situation   Psych Involvement: No (comment)  Admission diagnosis:  Weakness [R53.1] Acute UTI [N39.0] Generalized weakness [R53.1] Frequent falls [R29.6] Weakness generalized [R53.1] Patient Active Problem List   Diagnosis Date Noted   Generalized weakness 10/06/2023   Urinary tract infection 10/06/2023   Hypotension 10/06/2023   Weakness generalized 10/06/2023   Depression    B12 deficiency 06/11/2022   Iron deficiency anemia 04/10/2022   Morbid obesity (HCC) 03/30/2022   Vitamin A  deficiency 08/15/2021   Parkinson's disease (HCC) 08/03/2021   Acute urinary retention 08/03/2021   Meningioma (HCC) 04/05/2021   Hypothyroidism 08/15/2020   Osteopenia 06/14/2020   Status post total hip replacement, 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   Osteoarthritis of right hip 03/26/2016   H/O gastric bypass 03/26/2016   Sinus tachycardia 08/01/2015   Obesity (BMI 30-39.9) 06/08/2015   OSA (obstructive sleep apnea) 06/08/2015   GERD without esophagitis 06/08/2015   Incisional hernia 06/08/2015   Arthritis  of hip 03/23/2015   Left knee pain 02/15/2015   Vitamin D  deficiency 09/29/2014   Lump of right breast 09/22/2014   Prediabetes 09/22/2014   Pruritic dermatitis 09/22/2014   Obstructive sleep apnea 09/22/2014   Allergic rhinitis 09/15/2009   Hypersomnia 09/14/2009   Panic disorder 08/06/2008   Essential (primary) hypertension 07/16/2007   Hyperlipidemia 07/16/2007   PCP:  Gasper Nancyann BRAVO, MD Pharmacy:   [facility] - [place], [place] - [address] [place] [address] Phone: [phone] Fax: [phone]     Social Drivers of Health  (SDOH) Social History: SDOH Screenings   Food Insecurity: No Food Insecurity (10/06/2023)  Housing: Low Risk  (10/06/2023)  Transportation Needs: No Transportation Needs (10/06/2023)  Utilities: Not At Risk (10/06/2023)  Alcohol Screen: Low Risk  (03/30/2022)  Depression (PHQ2-9): Low Risk  (10/02/2023)  Recent Concern: Depression (PHQ2-9) - High Risk (09/12/2023)  Financial Resource Strain: Low Risk  (09/20/2023)  Physical Activity: Insufficiently Active (11/14/2022)  Social Connections: Socially Isolated (10/06/2023)  Stress: No Stress Concern Present (11/14/2022)  Tobacco Use: Low Risk  (09/20/2023)  Health Literacy: Adequate Health Literacy (11/14/2022)   SDOH Interventions:     Readmission Risk Interventions     No data to display

## 2023-10-07 NOTE — Progress Notes (Signed)
 Occupational Therapy Treatment Patient Details Name: Brittany Benton MRN: [id number] DOB: 08-May-1955 Today's Date: 10/07/2023   History of present illness Pt is a 68 year old female admitted with frequent falls with possible UTI as well as  degenerative changes related to Parkinson's disease, orthostatic hypotension     PMH significant for arkinson's disease , HTN and hypothyroidism, prior UTIs   OT comments  Pt seen for OT treatment on this date. Upon arrival to room pt semi supine, agreeable to tx. Sister at bedside, very supportive throughout session. Pt requires MAXA to come to the EOB, pt utilized the [facility] for x5 STS bouts to improve overall strength and confidence regarding functional mobility. Pt tolerated <1 mins static standing with bilateral UE support. Pt very excited about progress, towards end of session pt had some heavy confusion about her situation and location, able to redirect and console. Pt sister at bedside to comfort, pt calm and relaxed at the end of session. Pt making good progress toward goals, will continue to follow POC. Discharge recommendation remains appropriate.        If plan is discharge home, recommend the following:  Two people to help with walking and/or transfers;Two people to help with bathing/dressing/bathroom;Supervision due to cognitive status   Equipment Recommendations  Other (comment)    Recommendations for Other Services      Precautions / Restrictions Precautions Precautions: Fall Recall of Precautions/Restrictions: Impaired Restrictions Weight Bearing Restrictions Per Provider Order: No       Mobility Bed Mobility Overal bed mobility: Needs Assistance Bed Mobility: Supine to Sit, Sit to Supine     Supine to sit: Max assist Sit to supine: Mod assist   General bed mobility comments: External support for scooting hips required to come to the EOB    Transfers Overall transfer level: Needs assistance Equipment used: Rolling walker (2  wheels) Transfers: Sit to/from Stand Sit to Stand: +2 physical assistance, Min assist           General transfer comment: Utilized the Stedy to gain progress on STS tranfers Transfer via Lift Equipment: Stedy   Balance Overall balance assessment: Needs assistance Sitting-balance support: Feet supported, Bilateral upper extremity supported Sitting balance-Leahy Scale: Fair Sitting balance - Comments: Fearfull of falling, limiting pt to attempt sitting unsupported Postural control: Posterior lean Standing balance support: During functional activity, Bilateral upper extremity supported Standing balance-Leahy Scale: Poor Standing balance comment: Heavy reliant on bilateral support in static standing                           ADL either performed or assessed with clinical judgement   ADL Overall ADL's : Needs assistance/impaired                     Lower Body Dressing: Maximal assistance;Bed level Lower Body Dressing Details (indicate cue type and reason): Don socks at bed level               General ADL Comments: Pt eager to prioritize functional mobility on this date.    Extremity/Trunk Assessment              Vision       Restaurant manager, fast food Communication: No apparent difficulties   Cognition Arousal: Alert Behavior During Therapy: WFL for tasks assessed/performed Cognition: Cognition impaired   Orientation impairments: Situation, Place Awareness: Intellectual awareness intact Memory impairment (select all  impairments): Declarative long-term memory Attention impairment (select first level of impairment): Sustained attention Executive functioning impairment (select all impairments): Reasoning, Problem solving OT - Cognition Comments: On this date pt became disoriented at the end of session, very confused, sister at bedside to comfort.                 Following commands: Impaired Following  commands impaired: Follows one step commands with increased time      Cueing   Cueing Techniques: Verbal cues, Tactile cues, Visual cues  Exercises Exercises: Other exercises Other Exercises Other Exercises: Edu Pt/sister: Stedy transfer sequencing    Shoulder Instructions       General Comments Supine BP: 121/67(84), HR 869bpm, Sitting BP: 115/77(87), HR 89bpm    Pertinent Vitals/ Pain       Pain Assessment Pain Assessment: No/denies pain  Home Living                                          Prior Functioning/Environment              Frequency  Min 2X/week        Progress Toward Goals  OT Goals(current goals can now be found in the care plan section)  Progress towards OT goals: Progressing toward goals  Acute Rehab OT Goals OT Goal Formulation: With patient/family Time For Goal Achievement: 10/20/23 Potential to Achieve Goals: Good ADL Goals Pt Will Perform Grooming: with supervision;sitting Pt Will Perform Lower Body Dressing: with mod assist Pt Will Transfer to Toilet: with mod assist;stand pivot transfer Pt Will Perform Toileting - Clothing Manipulation and hygiene: with mod assist  Plan      Co-evaluation                 AM-PAC OT 6 Clicks Daily Activity     Outcome Measure   Help from another person eating meals?: A Little Help from another person taking care of personal grooming?: A Little Help from another person toileting, which includes using toliet, bedpan, or urinal?: Total Help from another person bathing (including washing, rinsing, drying)?: A Lot Help from another person to put on and taking off regular upper body clothing?: A Lot Help from another person to put on and taking off regular lower body clothing?: A Lot 6 Click Score: 13    End of Session Equipment Utilized During Treatment: Gait belt;Rolling walker (2 wheels)  OT Visit Diagnosis: Other abnormalities of gait and mobility (R26.89);Muscle  weakness (generalized) (M62.81)   Activity Tolerance Patient tolerated treatment well   Patient Left in bed;with call Casady/phone within reach;with bed alarm set;with family/visitor present   Nurse Communication Mobility status        Time: 1556-1630 OT Time Calculation (min): 34 min  Charges: OT General Charges $OT Visit: 1 Visit OT Treatments $Therapeutic Activity: 23-37 mins  Larraine Colas M.S. OTR/L  10/07/23, 5:12 PM

## 2023-10-07 NOTE — Progress Notes (Signed)
 Initial Nutrition Assessment  DOCUMENTATION CODES:   Obesity unspecified  INTERVENTION:   -Liberalize diet to regular for widest variety of meal selections -500 mg calcium  carbonate TID -MVI with minerals daily -D/c Ensure Plus High Protein po BID, each supplement provides 350 kcal and 20 grams of protein  -Boost Breeze po TID, each supplement provides 250 kcal and 9 grams of protein   NUTRITION DIAGNOSIS:   Inadequate oral intake related to poor appetite as evidenced by per patient/family report.  GOAL:   Patient will meet greater than or equal to 90% of their needs  MONITOR:   PO intake, Supplement acceptance  REASON FOR ASSESSMENT:   Consult Assessment of nutrition requirement/status  ASSESSMENT:   Pt with medical history significant for Parkinson's disease , HTN and hypothyroidism, prior UTIs,  being admitted with possible UTI presenting with protracted weakness.  Pt admitted wit UTI, generalized weakness, and frequent falls.   Reviewed I/O's: -170 ml x 24 hours  UOP: 1.1 L x 24 hours  Spoke with pt and family members at bedside. Pt pleasant and in good spirts today. She reports feeling better, however, pt does not have a good appetite. She reports that this has been ongoing for the past several months. Noted meal completions 50%.   Pt sisters and husband report that pt eats about 1-2 meals per day, but often eats very little. Husband reports that pt's diet is lacking in protein. Pt often eats takeout food and husband will bring pt whatever she wants, but will just pick at it and often avoid the protein. Husband thinks that giving her too may food choices may be giving her information overload. They also share that pt had gastric bypass surgery about 10 years ago; pt takes a MVI and B-12 supplement. She refused Ensure (don't bother send it, because I won't drink it). Family thinks that pt is resistant to protein shakes due to taking them after recovery from  bariatric surgery.   Sisters report that pt has lost about 30 pounds over the past 6 months. Reviewed wt hx; pt has experienced a 20.7% wt loss over the past 6 months, which is significant for time frame. Pt has also experienced an extreme functional decline over the past 2 weeks. At baseline, pt walks with a walker and uses a wheelchair to go out of for long distances. Husband reports it is now difficult for pt to get out of bed and even transfer from bed to chair. Pt has become so weak that lifting her is just dead weight. At baseline, pt is able to provide assistance with transfers. Family reports it is increasing difficult to transfer her, especially as they have health issues themselves.   Discussed importance of good meal and supplement intake to promote healing/. Pt amenable to try Boost Breeze as it is not a milky supplement.   Per TOC notes, plan for SNF placement at discharge.   Medications reviewed and include lovenox  and protonix .   Labs reviewed: CBGS: 66.    NUTRITION - FOCUSED PHYSICAL EXAM:  Flowsheet Row Most Recent Value  Orbital Region No depletion  Upper Arm Region No depletion  Thoracic and Lumbar Region No depletion  Buccal Region No depletion  Temple Region No depletion  Clavicle Bone Region No depletion  Clavicle and Acromion Bone Region No depletion  Scapular Bone Region No depletion  Dorsal Hand No depletion  Patellar Region No depletion  Anterior Thigh Region No depletion  Posterior Calf Region No depletion  Edema (  RD Assessment) None  Hair Reviewed  Eyes Reviewed  Mouth Reviewed  Skin Reviewed  Nails Reviewed    Diet Order:   Diet Order             Diet regular Fluid consistency: Thin  Diet effective now                   EDUCATION NEEDS:   Education needs have been addressed  Skin:  Skin Assessment: Reviewed RN Assessment  Last BM:  10/07/23 (type 6)  Height:   Ht Readings from Last 1 Encounters:  10/05/23 5' (1.524 m)     Weight:   Wt Readings from Last 1 Encounters:  10/05/23 72.6 kg    Ideal Body Weight:  45.5 kg  BMI:  Body mass index is 31.25 kg/m.  Estimated Nutritional Needs:   Kcal:  1600-1800  Protein:  85-100 grams  Fluid:  1.6-1.8 L    Margery ORN, RD, LDN, CDCES Registered Dietitian III Certified Diabetes Care and Education Specialist If unable to reach this RD, please use RD Inpatient group chat on secure chat between hours of 8am-4 pm daily

## 2023-10-07 NOTE — Progress Notes (Signed)
 [facility] Progress Note  Patient: Brittany Benton    FMW:[id number]  DOA: 10/05/2023     Date of Service: the patient was seen and examined on 10/07/2023  Chief Complaint  Patient presents with   Weakness   Brief hospital course: CAROLIE MCILRATH is a 68 y.o. female with medical history significant for Parkinson's disease , HTN and hypothyroidism, prior UTIs,  being admitted with possible UTI presenting with protracted weakness.   ED workup: BP intermittently soft as low as 102/53 and intermittent tachypnea to the mid 20s.  Labs notable for moderate leuks on UA but otherwise unremarkable with normal CBC and CMP..  EKG showed sinus rhythm at 78 with LVH.  CT head was nonacute showed generalized atrophy and an unchanged 8 mm meningioma. Patient treated with an NS bolus and Rocephin.  Admission requested    Assessment and Plan:  # Generalized weakness # Frequent falls, Likely multifactorial.  Possible progressive degenerative changes related to Parkinson's disease Patient is having difficulty with ADLs Frequent falls with frequent calls to rescue to help her up PT OT and TOC consult Might benefit from short-term rehab-discussed with patient and sister at bedside.  Also spoke about home health, which patient has had before but did not benefit her.  Discussed hospice care   # Pyuria, UTI ruled out.  Urine culture <10k colonies, insignificant growth.  Continue ceftriaxone for 3 days.   # Orthostatic hypotension # Hypotension # H/o Essential hypertension BP with EMS was 99/45, 102/53 following fluid bolus in the ED Hold home metoprolol  Continue to monitor S/p IVF  6/22 NS bolus 500 mL and started midodrine 5 mg p.o. 3 times daily with holding parameters. Check orthostatic every shift Monitor BP and titrate medications accordingly   # Parkinson's disease  # Generalized weakness # Frequent falls.  Ambulates with cane at baseline Continue Sinemet Patient is followed by  neurologist, Dr. Leetta visit 07/2023, noted progression of symptoms.  Sinemet was uptitrated but patient unable to tolerate higher dose   # Hypothyroidism: Continue levothyroxine  # Depression: Continue sertraline  and imipramine   Hypokalemia, potassium repleted. Monitor electrolytes and replete   OSA (obstructive sleep apnea) Currently does not use CPAP  # Obesity, class I Body mass index is 31.25 kg/m.  Interventions: Calorie restricted diet advised to lose body weight  Diet: Heart healthy diet DVT Prophylaxis: Subcutaneous Lovenox    Advance goals of care discussion: Full code  Family Communication: family was present at bedside, at the time of interview.  The pt provided permission to discuss medical plan with the family. Opportunity was given to ask question and all questions were answered satisfactorily.   Disposition:  Pt is from Home, admitted with UTI, generalized weakness and frequent falls, still has risk of fall and readmission, which precludes a safe discharge. Discharge to SNF, TBD after PT and OT eval. Medically patient is stable to discharge when bed will be available.  Subjective: No significant events overnight, patient resting comfortably, denied any specific complaints.   Physical Exam: General: NAD, lying comfortably Appear in no distress, affect appropriate Eyes: PERRLA ENT: Oral Mucosa Clear, moist  Neck: no JVD,  Cardiovascular: S1 and S2 Present, no Murmur,  Respiratory: good respiratory effort, Bilateral Air entry equal and Decreased, no Crackles, no wheezes Abdomen: Bowel Sound present, Soft and no tenderness,  Skin: no rashes Extremities: no Pedal edema, no calf tenderness Neurologic: without any new focal findings Gait not checked due to patient safety concerns  Vitals:   10/06/23  1954 10/07/23 0350 10/07/23 0830 10/07/23 1159  BP: 106/62 109/65 (!) 125/47 (!) 114/59  Pulse: 67 73 76 84  Resp: 16 18 18    Temp: 97.6 F (36.4 C) 97.9 F  (36.6 C) 97.7 F (36.5 C)   TempSrc: Axillary  Oral   SpO2: 93% 98% 100% 98%  Weight:      Height:        Intake/Output Summary (Last 24 hours) at 10/07/2023 1319 Last data filed at 10/07/2023 1020 Gross per 24 hour  Intake 690.01 ml  Output 1100 ml  Net -409.99 ml   Filed Weights   10/05/23 2056  Weight: 72.6 kg    Data Reviewed: I have personally reviewed and interpreted daily labs, tele strips, imagings as discussed above. I reviewed all nursing notes, pharmacy notes, vitals, pertinent old records I have discussed plan of care as described above with RN and patient/family.  CBC: Recent Labs  Lab 10/05/23 2053 10/07/23 0220  WBC 7.2 4.7  NEUTROABS 4.9  --   HGB 14.3 12.5  HCT 44.3 39.3  MCV 85.2 86.6  PLT 239 186   Basic Metabolic Panel: Recent Labs  Lab 10/05/23 2053 10/06/23 0320 10/07/23 0220  NA 136 139 138  K 3.6 3.2* 5.0  CL 100 105 107  CO2 23 26 23   GLUCOSE 90 86 93  BUN 14 11 8   CREATININE 0.96 0.84 0.78  CALCIUM  8.9 7.9* 8.6*  MG  --  1.7 2.2  PHOS  --  3.1 2.8    Studies: No results found.   Scheduled Meds:  atorvastatin   20 mg Oral QPM   Carbidopa-Levodopa ER  1 tablet Oral BID   enoxaparin  (LOVENOX ) injection  0.5 mg/kg Subcutaneous Q24H   feeding supplement  237 mL Oral TID BM   imipramine   50 mg Oral BID   levothyroxine   44 mcg Oral Q0600   midodrine  5 mg Oral TID WC   multivitamin with minerals  1 tablet Per Tube Daily   pantoprazole   40 mg Oral Daily   sertraline   100 mg Oral Daily   Continuous Infusions:  [START ON 10/08/2023] cefTRIAXone (ROCEPHIN)  IV     PRN Meds: acetaminophen  **OR** acetaminophen , HYDROcodone -acetaminophen , ondansetron  **OR** ondansetron  (ZOFRAN ) IV  Time spent: 35 minutes  Author: ELVAN SOR. MD [facility] 10/07/2023 1:19 PM  To reach On-call, see care teams to locate the attending and reach out to them via [email or web address]. If 7PM-7AM, please contact night-coverage If you still have  difficulty reaching the attending provider, please page the [facility] (Director on Call) for [facility] on amion for assistance.

## 2023-10-08 ENCOUNTER — Encounter: Payer: Medicare Other | Admitting: Speech Pathology

## 2023-10-08 ENCOUNTER — Ambulatory Visit: Payer: Medicare Other | Admitting: Physical Therapy

## 2023-10-08 DIAGNOSIS — R531 Weakness: Secondary | ICD-10-CM | POA: Diagnosis not present

## 2023-10-08 LAB — BASIC METABOLIC PANEL WITH GFR
Anion gap: 12 (ref 5–15)
BUN: 5 mg/dL — ABNORMAL LOW (ref 8–23)
CO2: 24 mmol/L (ref 22–32)
Calcium: 8.3 mg/dL — ABNORMAL LOW (ref 8.9–10.3)
Chloride: 103 mmol/L (ref 98–111)
Creatinine, Ser: 0.69 mg/dL (ref 0.44–1.00)
GFR, Estimated: >60 mL/min (ref >60)
Glucose, Bld: 82 mg/dL (ref 70–99)
Potassium: 3.7 mmol/L (ref 3.5–5.1)
Sodium: 139 mmol/L (ref 135–145)

## 2023-10-08 LAB — PHOSPHORUS: Phosphorus: 3.5 mg/dL (ref 2.5–4.6)

## 2023-10-08 LAB — CBC
HCT: 39 % (ref 36.0–46.0)
Hemoglobin: 12.8 g/dL (ref 12.0–15.0)
MCH: 27.9 pg (ref 26.0–34.0)
MCHC: 32.8 g/dL (ref 30.0–36.0)
MCV: 85 fL (ref 80.0–100.0)
Platelets: 196 10*3/uL (ref 150–400)
RBC: 4.59 MIL/uL (ref 3.87–5.11)
RDW: 13.8 % (ref 11.5–15.5)
WBC: 4.5 10*3/uL (ref 4.0–10.5)
nRBC: 0 % (ref 0.0–0.2)

## 2023-10-08 LAB — MAGNESIUM: Magnesium: 2 mg/dL (ref 1.7–2.4)

## 2023-10-08 MED ORDER — ADULT MULTIVITAMIN W/MINERALS CH
1.0000 | ORAL_TABLET | Freq: Two times a day (BID) | ORAL | Status: DC
  Administered 2023-10-08 – 2023-10-09 (×2): 1 via ORAL
  Filled 2023-10-08 (×2): qty 1

## 2023-10-08 MED ORDER — ENOXAPARIN SODIUM 40 MG/0.4ML IJ SOSY
40.0000 mg | PREFILLED_SYRINGE | Freq: Every day | INTRAMUSCULAR | Status: DC
  Administered 2023-10-08 – 2023-10-09 (×2): 40 mg via SUBCUTANEOUS
  Filled 2023-10-08: qty 0.4

## 2023-10-08 NOTE — Progress Notes (Signed)
 Physical Therapy Treatment Patient Details Name: Brittany Benton MRN: [id number] DOB: May 15, 1955 Today's Date: 10/08/2023   History of Present Illness Pt is a 68 year old female admitted with frequent falls with possible UTI as well as  degenerative changes related to Parkinson's disease, orthostatic hypotension     PMH significant for arkinson's disease , HTN and hypothyroidism, prior UTIs    PT Comments  Patient is agreeable to PT session. Supportive family member at the bedside. Patient continues to require assistance for bed mobility. Mod A required for standing. Limited standing tolerance for progression of ambulation. Recommend to continue PT to maximize independence. Rehabilitation < 3 hours/day recommended after this hospital stay.    If plan is discharge home, recommend the following: A lot of help with bathing/dressing/bathroom;Assist for transportation;Help with stairs or ramp for entrance;Assistance with cooking/housework   Can travel by private vehicle     No  Equipment Recommendations  None recommended by PT    Recommendations for Other Services       Precautions / Restrictions Precautions Precautions: Fall Recall of Precautions/Restrictions: Impaired Restrictions Weight Bearing Restrictions Per Provider Order: No     Mobility  Bed Mobility Overal bed mobility: Needs Assistance Bed Mobility: Supine to Sit, Sit to Supine, Rolling Rolling: Mod assist   Supine to sit: Mod assist Sit to supine: Mod assist   General bed mobility comments: assistance for rolling for getting onand off the bed pan. assistance for trunk and BLE support for bed mobility. increased time and effort required    Transfers Overall transfer level: Needs assistance Equipment used: Rolling walker (2 wheels) Transfers: Sit to/from Stand Sit to Stand: Mod assist           General transfer comment: lifting and lowering assistance provided. cues for anterior weight shifting     Ambulation/Gait               General Gait Details: unable to due to limited standing tolerance and generalized weakness   Stairs             Wheelchair Mobility     Tilt Bed    Modified Rankin (Stroke Patients Only)       Balance Overall balance assessment: Needs assistance Sitting-balance support: Feet supported, Bilateral upper extremity supported Sitting balance-Leahy Scale: Fair     Standing balance support: During functional activity, Bilateral upper extremity supported Standing balance-Leahy Scale: Poor Standing balance comment: external support required to maintain standing balance. standing tolerance limited by fatigue                            Communication Communication Communication: No apparent difficulties  Cognition Arousal: Alert Behavior During Therapy: WFL for tasks assessed/performed   PT - Cognitive impairments: History of cognitive impairments, Memory                         Following commands: Impaired Following commands impaired: Follows one step commands with increased time    Cueing Cueing Techniques: Verbal cues, Visual cues, Tactile cues  Exercises      General Comments        Pertinent Vitals/Pain Pain Assessment Pain Assessment: No/denies pain    Home Living                          Prior Function  PT Goals (current goals can now be found in the care plan section) Acute Rehab PT Goals Patient Stated Goal: stop falling PT Goal Formulation: With patient Time For Goal Achievement: 10/20/23 Potential to Achieve Goals: Good Progress towards PT goals: Progressing toward goals    Frequency    Min 1X/week      PT Plan      Co-evaluation              AM-PAC PT 6 Clicks Mobility   Outcome Measure  Help needed turning from your back to your side while in a flat bed without using bedrails?: A Little Help needed moving from lying on your back to sitting  on the side of a flat bed without using bedrails?: A Little Help needed moving to and from a bed to a chair (including a wheelchair)?: A Lot Help needed standing up from a chair using your arms (e.g., wheelchair or bedside chair)?: A Lot Help needed to walk in hospital room?: Total Help needed climbing 3-5 steps with a railing? : Total 6 Click Score: 12    End of Session   Activity Tolerance: Patient tolerated treatment well Patient left: in bed;with call [place]/phone within reach;with bed alarm set;with family/visitor present Nurse Communication: Mobility status PT Visit Diagnosis: Unsteadiness on feet (R26.81);Difficulty in walking, not elsewhere classified (R26.2);Repeated falls (R29.6)     Time: 8844-8782 PT Time Calculation (min) (ACUTE ONLY): 22 min  Charges:    $Therapeutic Activity: 8-22 mins PT General Charges $$ ACUTE PT VISIT: 1 Visit                    Brittany Benton, PT, MPT    Brittany Benton 10/08/2023, 1:11 PM

## 2023-10-08 NOTE — Progress Notes (Signed)
 [facility] Progress Note  Patient: Brittany Benton    FMW:[id number]  DOA: 10/05/2023     Date of Service: the patient was seen and examined on 10/08/2023  Chief Complaint  Patient presents with   Weakness   Brief hospital course: KARLISSA ARON is a 68 y.o. female with medical history significant for Parkinson's disease , HTN and hypothyroidism, prior UTIs,  being admitted with possible UTI presenting with protracted weakness.   ED workup: BP intermittently soft as low as 102/53 and intermittent tachypnea to the mid 20s.  Labs notable for moderate leuks on UA but otherwise unremarkable with normal CBC and CMP..  EKG showed sinus rhythm at 78 with LVH.  CT head was nonacute showed generalized atrophy and an unchanged 8 mm meningioma. Patient treated with an NS bolus and Rocephin.  Admission requested    Assessment and Plan:  # Generalized weakness # Frequent falls, Likely multifactorial.  Possible progressive degenerative changes related to Parkinson's disease Patient is having difficulty with ADLs Frequent falls with frequent calls to rescue to help her up PT OT and TOC consult Might benefit from short-term rehab-discussed with patient and sister at bedside.  Also spoke about home health, which patient has had before but did not benefit her.  Discussed hospice care   # Pyuria, UTI ruled out.  Urine culture <10k colonies, insignificant growth.  S/p Ceftriaxone x 3 days.   # Orthostatic hypotension # Hypotension # H/o Essential hypertension BP with EMS was 99/45, 102/53 following fluid bolus in the ED Hold home metoprolol  Continue to monitor S/p IVF  6/22 NS bolus 500 mL and started midodrine 5 mg p.o. 3 times daily with holding parameters. Check orthostatic every shift Monitor BP and titrate medications accordingly   # Parkinson's disease  # Generalized weakness # Frequent falls.  Ambulates with cane at baseline Continue Sinemet Patient is followed by neurologist, Dr.  Leetta visit 07/2023, noted progression of symptoms.  Sinemet was uptitrated but patient unable to tolerate higher dose   # Hypothyroidism: Continue levothyroxine  # Depression: Continue sertraline  and imipramine  # Anxiety, started Xanax  as needed  # Hypokalemia, potassium repleted.  Resolved Monitor electrolytes and replete   OSA (obstructive sleep apnea) Currently does not use CPAP  # Obesity, class I Body mass index is 31.25 kg/m.  Interventions: Calorie restricted diet advised to lose body weight  Diet: Heart healthy diet DVT Prophylaxis: Subcutaneous Lovenox    Advance goals of care discussion: Full code  Family Communication: family was present at bedside, at the time of interview.  The pt provided permission to discuss medical plan with the family. Opportunity was given to ask question and all questions were answered satisfactorily.   Disposition:  Pt is from Home, admitted with UTI, generalized weakness and frequent falls, still has risk of fall and readmission, which precludes a safe discharge. Discharge to SNF, TBD after PT and OT eval. Patient is medically stable to discharge when bed will be available.  Subjective: No significant events overnight, patient was feeling good, had good sleep last night.  Denied any complaints.   Physical Exam: General: NAD, lying comfortably Appear in no distress, affect appropriate Eyes: PERRLA ENT: Oral Mucosa Clear, moist  Neck: no JVD,  Cardiovascular: S1 and S2 Present, no Murmur,  Respiratory: good respiratory effort, Bilateral Air entry equal and Decreased, no Crackles, no wheezes Abdomen: Bowel Sound present, Soft and no tenderness,  Skin: no rashes Extremities: no Pedal edema, no calf tenderness Neurologic: without any new  focal findings Gait not checked due to patient safety concerns  Vitals:   10/07/23 1929 10/08/23 0439 10/08/23 0730 10/08/23 1306  BP: (!) 123/57 111/67 (!) 130/54 105/63  Pulse: 84 85 81 (!) 102   Resp:   18   Temp: 97.8 F (36.6 C) (!) 96.8 F (36 C) 97.6 F (36.4 C)   TempSrc:   Oral   SpO2: 92% 95% 96% 96%  Weight:      Height:        Intake/Output Summary (Last 24 hours) at 10/08/2023 1346 Last data filed at 10/08/2023 9057 Gross per 24 hour  Intake 0 ml  Output 950 ml  Net -950 ml   Filed Weights   10/05/23 2056  Weight: 72.6 kg    Data Reviewed: I have personally reviewed and interpreted daily labs, tele strips, imagings as discussed above. I reviewed all nursing notes, pharmacy notes, vitals, pertinent old records I have discussed plan of care as described above with RN and patient/family.  CBC: Recent Labs  Lab 10/05/23 2053 10/07/23 0220 10/08/23 0319  WBC 7.2 4.7 4.5  NEUTROABS 4.9  --   --   HGB 14.3 12.5 12.8  HCT 44.3 39.3 39.0  MCV 85.2 86.6 85.0  PLT 239 186 196   Basic Metabolic Panel: Recent Labs  Lab 10/05/23 2053 10/06/23 0320 10/07/23 0220 10/08/23 0319  NA 136 139 138 139  K 3.6 3.2* 5.0 3.7  CL 100 105 107 103  CO2 23 26 23 24   GLUCOSE 90 86 93 82  BUN 14 11 8  5*  CREATININE 0.96 0.84 0.78 0.69  CALCIUM  8.9 7.9* 8.6* 8.3*  MG  --  1.7 2.2 2.0  PHOS  --  3.1 2.8 3.5    Studies: No results found.   Scheduled Meds:  atorvastatin   20 mg Oral QPM   calcium  carbonate  1 tablet Oral TID WC   Carbidopa-Levodopa ER  1 tablet Oral BID   enoxaparin  (LOVENOX ) injection  40 mg Subcutaneous Daily   feeding supplement  1 Container Oral TID BM   imipramine   50 mg Oral BID   levothyroxine   44 mcg Oral Q0600   midodrine  5 mg Oral TID WC   multivitamin with minerals  1 tablet Oral BID   pantoprazole   40 mg Oral Daily   sertraline   100 mg Oral Daily   Continuous Infusions:   PRN Meds: acetaminophen  **OR** acetaminophen , ALPRAZolam , HYDROcodone -acetaminophen , ondansetron  **OR** ondansetron  (ZOFRAN ) IV  Time spent: 35 minutes  Author: ELVAN SOR. MD [facility] 10/08/2023 1:46 PM  To reach On-call, see care teams  to locate the attending and reach out to them via [email or web address]. If 7PM-7AM, please contact night-coverage If you still have difficulty reaching the attending provider, please page the [facility] (Director on Call) for [facility] on amion for assistance.

## 2023-10-08 NOTE — NC FL2 (Signed)
 [place]  MEDICAID FL2 LEVEL OF CARE FORM     IDENTIFICATION  Patient Name: Brittany Benton Birthdate: 15-Jan-1956 Sex: female Admission Date (Current Location): 10/05/2023  [place] and [place] Number:  Chiropodist and Address:  [facility], [address] [place], [place] [address]      Provider Number: [id number]  Attending Physician Name and Address:  Von Bellis, MD  Relative Name and Phone Number:  Casha Estupinan [phone]    Current Level of Care: Hospital Recommended Level of Care: Skilled Nursing Facility Prior Approval Number:    Date Approved/Denied:   PASRR Number: [id number]  Discharge Plan:      Current Diagnoses: Patient Active Problem List   Diagnosis Date Noted   Generalized weakness 10/06/2023   Urinary tract infection 10/06/2023   Hypotension 10/06/2023   Weakness generalized 10/06/2023   Depression    B12 deficiency 06/11/2022   Iron deficiency anemia 04/10/2022   Morbid obesity (HCC) 03/30/2022   Vitamin A  deficiency 08/15/2021   Parkinson's disease (HCC) 08/03/2021   Acute urinary retention 08/03/2021   Meningioma (HCC) 04/05/2021   Hypothyroidism 08/15/2020   Osteopenia 06/14/2020   Status post total hip replacement, 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   Osteoarthritis of right hip 03/26/2016   H/O gastric bypass 03/26/2016   Sinus tachycardia 08/01/2015   Obesity (BMI 30-39.9) 06/08/2015   OSA (obstructive sleep apnea) 06/08/2015   GERD without esophagitis 06/08/2015   Incisional hernia 06/08/2015   Arthritis of hip 03/23/2015   Left knee pain 02/15/2015   Vitamin D  deficiency 09/29/2014   Lump of right breast 09/22/2014   Prediabetes 09/22/2014   Pruritic dermatitis 09/22/2014   Obstructive sleep apnea 09/22/2014   Allergic rhinitis 09/15/2009   Hypersomnia 09/14/2009   Panic disorder 08/06/2008   Essential (primary) hypertension 07/16/2007   Hyperlipidemia 07/16/2007    Orientation RESPIRATION BLADDER Height &  Weight     Self, Time, Situation, Place    Continent Weight: 72.6 kg Height:  5' (152.4 cm)  BEHAVIORAL SYMPTOMS/MOOD NEUROLOGICAL BOWEL NUTRITION STATUS      Continent Diet (Regular Diet)  AMBULATORY STATUS COMMUNICATION OF NEEDS Skin   Limited Assist                           Personal Care Assistance Level of Assistance  Feeding, Dressing, Bathing Bathing Assistance: Limited assistance Feeding assistance: Limited assistance Dressing Assistance: Limited assistance     Functional Limitations Info             SPECIAL CARE FACTORS FREQUENCY                       Contractures      Additional Factors Info  Code Status, Allergies Code Status Info: FULL Allergies Info: Codeine, Morphine, Tramadol , Tape           Current Medications (10/08/2023):  This is the current hospital active medication list Current Facility-Administered Medications  Medication Dose Route Frequency Provider Last Rate Last Admin   acetaminophen  (TYLENOL ) tablet 650 mg  650 mg Oral Q6H PRN Duncan, Hazel V, MD       Or   acetaminophen  (TYLENOL ) suppository 650 mg  650 mg Rectal Q6H PRN Cleatus Delayne GAILS, MD       ALPRAZolam  (XANAX ) tablet 0.5 mg  0.5 mg Oral TID PRN Von Bellis, MD   0.5 mg at 10/07/23 2231   atorvastatin  (LIPITOR) tablet 20 mg  20 mg  Oral QPM Duncan, Hazel V, MD   20 mg at 10/07/23 1613   calcium  carbonate (TUMS - dosed in mg elemental calcium ) chewable tablet 200 mg of elemental calcium   1 tablet Oral TID WC Kumar, Dileep, MD       Carbidopa-Levodopa ER (SINEMET CR) 25-100 MG tablet controlled release 1 tablet  1 tablet Oral BID Duncan, Hazel V, MD   1 tablet at 10/07/23 2231   enoxaparin  (LOVENOX ) injection 37.5 mg  0.5 mg/kg Subcutaneous Q24H Duncan, Hazel V, MD   37.5 mg at 10/07/23 0848   feeding supplement (BOOST / RESOURCE BREEZE) liquid 1 Container  1 Container Oral TID BM Kumar, Dileep, MD       HYDROcodone -acetaminophen  (NORCO/VICODIN) 5-325 MG per tablet 1-2  tablet  1-2 tablet Oral Q4H PRN Duncan, Hazel V, MD       imipramine  (TOFRANIL ) tablet 50 mg  50 mg Oral BID Cleatus Hoof V, MD   50 mg at 10/07/23 2231   levothyroxine  (SYNTHROID ) tablet 44 mcg  44 mcg Oral Q0600 Cleatus Hoof GAILS, MD   44 mcg at 10/07/23 0614   midodrine (PROAMATINE) tablet 5 mg  5 mg Oral TID WC Kumar, Dileep, MD   5 mg at 10/07/23 1201   multivitamin with minerals tablet 1 tablet  1 tablet Per Tube BID Von Bellis, MD       ondansetron  (ZOFRAN ) tablet 4 mg  4 mg Oral Q6H PRN Duncan, Hazel V, MD       Or   ondansetron  (ZOFRAN ) injection 4 mg  4 mg Intravenous Q6H PRN Cleatus Hoof GAILS, MD       pantoprazole  (PROTONIX ) EC tablet 40 mg  40 mg Oral Daily Duncan, Hazel V, MD   40 mg at 10/07/23 0848   sertraline  (ZOLOFT ) tablet 100 mg  100 mg Oral Daily Duncan, Hazel V, MD   100 mg at 10/07/23 0848     Discharge Medications: Please see discharge summary for a list of discharge medications.  Relevant Imaging Results:  Relevant Lab Results:   Additional Information SS# [id number]  Dalia GORMAN Fuse, RN

## 2023-10-08 NOTE — Plan of Care (Signed)
  Problem: Education: Goal: Knowledge of General Education information will improve Description: Including pain rating scale, medication(s)/side effects and non-pharmacologic comfort measures Outcome: Progressing   Problem: Health Behavior/Discharge Planning: Goal: Ability to manage health-related needs will improve Outcome: Progressing   Problem: Clinical Measurements: Goal: Ability to maintain clinical measurements within normal limits will improve Outcome: Progressing   Problem: Activity: Goal: Risk for activity intolerance will decrease Outcome: Progressing   Problem: Coping: Goal: Level of anxiety will decrease Outcome: Progressing   Problem: Pain Managment: Goal: General experience of comfort will improve and/or be controlled Outcome: Progressing   Problem: Safety: Goal: Ability to remain free from injury will improve Outcome: Progressing   Problem: Skin Integrity: Goal: Risk for impaired skin integrity will decrease Outcome: Progressing

## 2023-10-08 NOTE — Progress Notes (Addendum)
 Mobility Specialist - Progress Note   10/08/23 1500  Mobility  Activity Stood at bedside;Ambulated with assistance in room  Level of Assistance Contact guard assist, steadying assist  Assistive Device Front wheel walker  Distance Ambulated (ft) 4 ft  Activity Response Tolerated well  Mobility visit 1 Mobility     Pt lying in bed upon arrival, utilizing RA. Pt pleasant and agreeable to activity. Pt completed bed mobility with modA. Denied dizziness upon sitting. Occasional CGA for sitting balance as pt has some post lean; corrected with cueing. Assist using bed pad to achieve forward scoot for foot placement onto floor. STS x3 with modA fading to minA. Pt does endorse fear of falling during standing bouts. Use of mirror for pt to gage how well her upright posture actually is. Limited standing tolerance. On final STS attempt pt does progress to taking a few lateral steps towards [facility] with only minimal foot clearance. Pt returned to bed with assist onto LE to return supine. Pt left in bed with alarm set, needs in reach. Niece at bedside.    Lennette Seip Mobility Specialist 10/08/23, 3:33 PM

## 2023-10-09 ENCOUNTER — Encounter: Payer: Self-pay | Admitting: Student

## 2023-10-09 DIAGNOSIS — I10 Essential (primary) hypertension: Secondary | ICD-10-CM | POA: Diagnosis not present

## 2023-10-09 DIAGNOSIS — R531 Weakness: Secondary | ICD-10-CM | POA: Diagnosis not present

## 2023-10-09 DIAGNOSIS — R0902 Hypoxemia: Secondary | ICD-10-CM | POA: Diagnosis not present

## 2023-10-09 DIAGNOSIS — Z7401 Bed confinement status: Secondary | ICD-10-CM | POA: Diagnosis not present

## 2023-10-09 DIAGNOSIS — M6281 Muscle weakness (generalized): Secondary | ICD-10-CM | POA: Diagnosis not present

## 2023-10-09 DIAGNOSIS — Z5189 Encounter for other specified aftercare: Secondary | ICD-10-CM | POA: Diagnosis not present

## 2023-10-09 DIAGNOSIS — M1611 Unilateral primary osteoarthritis, right hip: Secondary | ICD-10-CM | POA: Diagnosis not present

## 2023-10-09 DIAGNOSIS — Z743 Need for continuous supervision: Secondary | ICD-10-CM | POA: Diagnosis not present

## 2023-10-09 DIAGNOSIS — R296 Repeated falls: Secondary | ICD-10-CM | POA: Diagnosis not present

## 2023-10-09 DIAGNOSIS — G20A1 Parkinson's disease without dyskinesia, without mention of fluctuations: Secondary | ICD-10-CM | POA: Diagnosis not present

## 2023-10-09 DIAGNOSIS — I951 Orthostatic hypotension: Secondary | ICD-10-CM | POA: Diagnosis not present

## 2023-10-09 DIAGNOSIS — N390 Urinary tract infection, site not specified: Secondary | ICD-10-CM | POA: Diagnosis not present

## 2023-10-09 DIAGNOSIS — R2689 Other abnormalities of gait and mobility: Secondary | ICD-10-CM | POA: Diagnosis not present

## 2023-10-09 LAB — CBC
HCT: 39.3 % (ref 36.0–46.0)
Hemoglobin: 12.8 g/dL (ref 12.0–15.0)
MCH: 27.7 pg (ref 26.0–34.0)
MCHC: 32.6 g/dL (ref 30.0–36.0)
MCV: 85.1 fL (ref 80.0–100.0)
Platelets: 229 10*3/uL (ref 150–400)
RBC: 4.62 MIL/uL (ref 3.87–5.11)
RDW: 13.6 % (ref 11.5–15.5)
WBC: 4.9 10*3/uL (ref 4.0–10.5)
nRBC: 0 % (ref 0.0–0.2)

## 2023-10-09 LAB — BASIC METABOLIC PANEL WITH GFR
Anion gap: 9 (ref 5–15)
BUN: 6 mg/dL — ABNORMAL LOW (ref 8–23)
CO2: 24 mmol/L (ref 22–32)
Calcium: 8.7 mg/dL — ABNORMAL LOW (ref 8.9–10.3)
Chloride: 105 mmol/L (ref 98–111)
Creatinine, Ser: 0.72 mg/dL (ref 0.44–1.00)
GFR, Estimated: >60 mL/min (ref >60)
Glucose, Bld: 82 mg/dL (ref 70–99)
Potassium: 3.5 mmol/L (ref 3.5–5.1)
Sodium: 138 mmol/L (ref 135–145)

## 2023-10-09 LAB — PHOSPHORUS: Phosphorus: 4.1 mg/dL (ref 2.5–4.6)

## 2023-10-09 LAB — MAGNESIUM: Magnesium: 2 mg/dL (ref 1.7–2.4)

## 2023-10-09 MED ORDER — ALPRAZOLAM 0.25 MG PO TABS: 0.2500 mg | ORAL_TABLET | Freq: Two times a day (BID) | ORAL | 0 refills | Status: DC | PRN

## 2023-10-09 MED ORDER — ADULT MULTIVITAMIN W/MINERALS CH: 1.0000 | ORAL_TABLET | Freq: Two times a day (BID) | ORAL | Status: DC

## 2023-10-09 MED ORDER — MIDODRINE HCL 5 MG PO TABS: 5.0000 mg | ORAL_TABLET | Freq: Three times a day (TID) | ORAL | Status: DC

## 2023-10-09 NOTE — Discharge Summary (Signed)
 [facility] Discharge Summary   Patient: Brittany Benton FMW:[id number]  PCP: [name], MD  Date of admission: 10/05/2023   Date of discharge:  10/09/2023     Discharge Diagnoses:  Principal Problem:   Generalized weakness Active Problems:   Urinary tract infection   Hypotension   Parkinson's disease (HCC)   Essential (primary) hypertension   Hypothyroidism   Obstructive sleep apnea   OSA (obstructive sleep apnea)   Depression   Weakness generalized   Admitted From: Home Disposition:  SNF   Recommendations for Outpatient Follow-up:  Follow-up with PCP, patient should be seen by an MD in 1 to 2 days, continue to monitor BP and titrate medication accordingly, started midodrine due to orthostatic hypotension and held Toprol -XL for now. Continue to monitor orthostatics, fall precautions and physical therapy. Follow up LABS/TEST:  As above   Follow-up Information     Fisher, Nancyann FORBES, MD Follow up.   Specialty: Family Medicine Why: Hospital follow up Contact information: [address] [place] [address] [place] [address] [phone]                Diet recommendation: Regular diet  Activity: The patient is advised to gradually reintroduce usual activities, as tolerated  Discharge Condition: stable  Code Status: Full code   History of present illness: As per the H and P dictated on admission. Hospital Course:  Brittany Benton is a 68 y.o. female with medical history significant for Parkinson's disease , HTN and hypothyroidism, prior UTIs,  being admitted with possible UTI presenting with protracted weakness.    ED workup: BP intermittently soft as low as 102/53 and intermittent tachypnea to the mid 20s.  Labs notable for moderate leuks on UA but otherwise unremarkable with normal CBC and CMP..  EKG showed sinus rhythm at 78 with LVH.  CT head was nonacute showed generalized atrophy and an unchanged 8 mm meningioma. Patient treated with an NS bolus and  Rocephin.  Admission requested      Assessment and Plan:   # Generalized weakness # Frequent falls, Likely multifactorial.  Possible progressive degenerative changes related to Parkinson's disease Patient is having difficulty with ADLs, Frequent falls with frequent calls to rescue to help her up.  PT and OT eval done, recommend SNF placement.  Patient got discharged health to be discharged to [facility] today.   # Pyuria, UTI ruled out.  Urine culture <10k colonies, insignificant growth.  S/p Ceftriaxone x 3 days.   # Orthostatic hypotension # Hypotension # H/o Essential hypertension BP with EMS was 99/45, 102/53 following fluid bolus in the ED Hold home metoprolol . S/p IVF.  Continue oral hydration 6/22 NS bolus 500 mL and started midodrine 5 mg p.o. 3 times daily with holding parameters. Check orthostatic every shift Monitor BP and titrate medications accordingly   # Parkinson's disease  # Generalized weakness # Frequent falls.  Ambulates with cane at baseline Continue Sinemet Patient is followed by neurologist, Dr. Leetta visit 07/2023, noted progression of symptoms.  Sinemet was uptitrated but patient unable to tolerate higher dose   # Hypothyroidism: Continue levothyroxine  # Depression: Continue sertraline  and imipramine  # Anxiety, started Xanax  as needed # Hypokalemia, potassium repleted.  Resolved OSA (obstructive sleep apnea) Currently does not use CPAP   # Obesity, class I Body mass index is 31.25 kg/m.  Nutrition Problem: Inadequate oral intake Etiology: poor appetite Nutrition Interventions: Interventions: MVI, Liberalize Diet, Boost Breeze   Pain control  - [place]  Controlled  Substance Reporting System database could not be reviewed as website was not working. - Xanax  0.25 mg 10 tablet prescription given as per SNF requirement - Patient was instructed, not to drive, operate heavy machinery, perform activities at heights, swimming or participation in  water activities or provide baby sitting services while on Pain, Sleep and Anxiety Medications; until her outpatient Physician has advised to do so again.  - Also recommended to not to take more than prescribed Pain, Sleep and Anxiety Medications.  Patient was seen by physical therapy, who recommended Therapy, SNF placement, which was arranged. On the day of the discharge the patient's vitals were stable, and no other acute medical condition were reported by patient. the patient was felt safe to be discharge at SNF with Therapy.  Consultants: None Procedures: None  Discharge Exam: General: Appear in no distress, no Rash; Oral Mucosa Clear, moist. Cardiovascular: S1 and S2 Present, no Murmur, Respiratory: normal respiratory effort, Bilateral Air entry present and no Crackles, no wheezes Abdomen: Bowel Sound present, Soft and no tenderness, no hernia Extremities: no Pedal edema, no calf tenderness Neurology: alert and oriented to time, place, and person affect appropriate.  Filed Weights   10/05/23 2056  Weight: 72.6 kg   Vitals:   10/09/23 0739 10/09/23 1211  BP: 115/72 100/67  Pulse: 89 (!) 108  Resp: 16   Temp: (!) 97.5 F (36.4 C)   SpO2: 99% 95%    DISCHARGE MEDICATION: Allergies as of 10/09/2023       Reactions   Codeine Nausea And Vomiting, Other (See Comments)   Morphine Hives, Swelling, Other (See Comments)   Tape Other (See Comments)   blisters blisters   Tramadol  Other (See Comments)   Hallucinations        Medication List     PAUSE taking these medications    metoprolol  succinate 50 MG 24 hr tablet Wait to take this until your doctor or other care provider tells you to start again. Commonly known as: TOPROL -XL TAKE ONE TABLET BY MOUTH ONCE DAILY WITHOR IMMEDIATELY FOLLOWING A MEAL       STOP taking these medications    fluconazole  150 MG tablet Commonly known as: DIFLUCAN        TAKE these medications    ALPRAZolam  0.25 MG tablet Commonly  known as: XANAX  Take 1 tablet (0.25 mg total) by mouth 2 (two) times daily as needed for anxiety. What changed: See the new instructions.   atorvastatin  20 MG tablet Commonly known as: LIPITOR TAKE 1 TABLET BY MOUTH EVERY EVENING   Carbidopa-Levodopa ER 25-100 MG tablet controlled release Commonly known as: SINEMET CR Take 1 tablet by mouth 2 (two) times daily.   imipramine  50 MG tablet Commonly known as: TOFRANIL  TAKE ONE TABLET BY MOUTH TWICE DAILY   ketoconazole  2 % cream Commonly known as: NIZORAL  Apply 1 Application topically daily. Until rash resolves completely.   levothyroxine  88 MCG tablet Commonly known as: SYNTHROID  Take 0.5 tablets (44 mcg total) by mouth daily.   midodrine 5 MG tablet Commonly known as: PROAMATINE Take 1 tablet (5 mg total) by mouth 3 (three) times daily with meals. Hold if SBP > 120 mmHg and/or HR <65   multivitamin with minerals Tabs tablet Take 1 tablet by mouth 2 (two) times daily.   omeprazole  20 MG capsule Commonly known as: PRILOSEC TAKE 1 CAPSULE BY MOUTH ONCE DAILY   sertraline  100 MG tablet Commonly known as: ZOLOFT  TAKE ONE TABLET BY MOUTH DAILY   VITAMIN C  PO Take 1 tablet by mouth daily.   Vitamin D -3 25 MCG (1000 UT) Caps Take 1 capsule by mouth daily.       Allergies  Allergen Reactions   Codeine Nausea And Vomiting and Other (See Comments)   Morphine Hives, Swelling and Other (See Comments)   Tape Other (See Comments)    blisters blisters   Tramadol  Other (See Comments)    Hallucinations   Discharge Instructions     Call MD for:  difficulty breathing, headache or visual disturbances   Complete by: As directed    Call MD for:  extreme fatigue   Complete by: As directed    Call MD for:  persistant dizziness or light-headedness   Complete by: As directed    Call MD for:  persistant nausea and vomiting   Complete by: As directed    Call MD for:  severe uncontrolled pain   Complete by: As directed    Call  MD for:  temperature >100.4   Complete by: As directed    Diet - low sodium heart healthy   Complete by: As directed    Discharge instructions   Complete by: As directed    Follow-up with PCP, patient should be seen by an MD in 1 to 2 days, continue to monitor BP and titrate medication accordingly, started midodrine due to orthostatic hypotension and held Toprol -XL for now. Continue to monitor orthostatics, fall precautions and physical therapy.   Increase activity slowly   Complete by: As directed        The results of significant diagnostics from this hospitalization (including imaging, microbiology, ancillary and laboratory) are listed below for reference.    Significant Diagnostic Studies: CT Head Wo Contrast Result Date: 10/05/2023 CLINICAL DATA:  Weakness and altered mental status EXAM: CT HEAD WITHOUT CONTRAST TECHNIQUE: Contiguous axial images were obtained from the base of the skull through the vertex without intravenous contrast. RADIATION DOSE REDUCTION: This exam was performed according to the departmental dose-optimization program which includes automated exposure control, adjustment of the mA and/or kV according to patient size and/or use of iterative reconstruction technique. COMPARISON:  None Available. FINDINGS: Brain: There is no hemorrhage or extra-axial collection. There is generalized atrophy without lobar predilection. Hypodensity of the white matter is most commonly associated with chronic microvascular disease. 8 mm left convexity meningioma, unchanged. Vascular: No hyperdense vessel or unexpected vascular calcification. Skull: The visualized skull base, calvarium and extracranial soft tissues are normal. Sinuses/Orbits: No fluid levels or advanced mucosal thickening of the visualized paranasal sinuses. No mastoid or middle ear effusion. Normal orbits. Other: None. IMPRESSION: 1. No acute intracranial abnormality. 2. Generalized atrophy and findings of chronic microvascular  disease. 3. Unchanged 8 mm left convexity meningioma. Electronically Signed   By: Franky Stanford M.D.   On: 10/05/2023 22:26    Microbiology: Recent Results (from the past 240 hours)  Urine Culture     Status: Abnormal   Collection Time: 10/05/23 11:00 PM   Specimen: Urine, Random  Result Value Ref Range Status   Specimen Description   Final    URINE, RANDOM Performed at [facility], [address], [place], [place] [address]    Special Requests   Final    NONE Reflexed from [id number] Performed at [facility], [address] [place], [place] [address]    Culture (A)  Final    <10,000 COLONIES/mL INSIGNIFICANT GROWTH Performed at [facility], [address], [place], [place] [address]  Report Status 10/07/2023 FINAL  Final     Labs: CBC: Recent Labs  Lab 10/05/23 2053 10/07/23 0220 10/08/23 0319 10/09/23 0408  WBC 7.2 4.7 4.5 4.9  NEUTROABS 4.9  --   --   --   HGB 14.3 12.5 12.8 12.8  HCT 44.3 39.3 39.0 39.3  MCV 85.2 86.6 85.0 85.1  PLT 239 186 196 229   Basic Metabolic Panel: Recent Labs  Lab 10/05/23 2053 10/06/23 0320 10/07/23 0220 10/08/23 0319 10/09/23 0408  NA 136 139 138 139 138  K 3.6 3.2* 5.0 3.7 3.5  CL 100 105 107 103 105  CO2 23 26 23 24 24   GLUCOSE 90 86 93 82 82  BUN 14 11 8  5* 6*  CREATININE 0.96 0.84 0.78 0.69 0.72  CALCIUM  8.9 7.9* 8.6* 8.3* 8.7*  MG  --  1.7 2.2 2.0 2.0  PHOS  --  3.1 2.8 3.5 4.1   Liver Function Tests: Recent Labs  Lab 10/05/23 2053  AST 20  ALT 9  ALKPHOS 87  BILITOT 1.3*  PROT 6.9  ALBUMIN 3.6   No results for input(s): LIPASE, AMYLASE in the last 168 hours. No results for input(s): AMMONIA in the last 168 hours. Cardiac Enzymes: No results for input(s): CKTOTAL, CKMB, CKMBINDEX, TROPONINI in the last 168 hours. BNP (last 3 results) No results for input(s): BNP in the last 8760 hours. CBG: No results for input(s): GLUCAP in the last 168 hours.  Time spent:  35 minutes  Signed:  Elvan Sor  [facility] 10/09/2023 12:22 PM

## 2023-10-09 NOTE — TOC Progression Note (Signed)
 Transition of Care [facility]) - Progression Note    Patient Details  Name: Brittany Benton MRN: [id number] Date of Birth: 07-27-1955  [facility] CM/SW Contact  Dalia GORMAN Fuse, RN Phone Number: 10/09/2023, 8:32 AM  Clinical Narrative:    Patient has a bed offer from [facility] [place] to request insurance auth.      Barriers to Discharge: Continued Medical Work up  Expected Discharge Plan and Services       Living arrangements for the past 2 months: Single Family Home                                       Social Determinants of Health (SDOH) Interventions SDOH Screenings   Food Insecurity: No Food Insecurity (10/06/2023)  Housing: Low Risk  (10/06/2023)  Transportation Needs: No Transportation Needs (10/06/2023)  Utilities: Not At Risk (10/06/2023)  Alcohol Screen: Low Risk  (03/30/2022)  Depression (PHQ2-9): Low Risk  (10/02/2023)  Recent Concern: Depression (PHQ2-9) - High Risk (09/12/2023)  Financial Resource Strain: Low Risk  (09/20/2023)  Physical Activity: Insufficiently Active (11/14/2022)  Social Connections: Socially Isolated (10/06/2023)  Stress: No Stress Concern Present (11/14/2022)  Tobacco Use: Low Risk  (09/20/2023)  Health Literacy: Adequate Health Literacy (11/14/2022)    Readmission Risk Interventions     No data to display

## 2023-10-09 NOTE — Progress Notes (Signed)
 Occupational Therapy Treatment Patient Details Name: Brittany Benton MRN: [id number] DOB: 04-03-1956 Today's Date: 10/09/2023   History of present illness Pt is a 68 year old female admitted with frequent falls with possible UTI as well as  degenerative changes related to Parkinson's disease, orthostatic hypotension     PMH significant for arkinson's disease , HTN and hypothyroidism, prior UTIs   OT comments  Brittany Benton seen for OT treatment on this date. Upon arrival to room pt in bed, agreeable to tx. Pt requires MOD A + HHA for bed mobility and trunk support for posterior lean. Pt required MIN A + 2 for safety + RW for STS at EOB.  Pt initiated standing grooming task at sink and demonstrated ~30 sec. standing tolerance before fatiguing; completed grooming task with MIN A seated EOB. Pt had bowel movement on BSC, requiring MAX A for pericare and MOD A for STS x3 for rest breaks and t/f to chair. Orthostatic measurements taken, standing x 3 not measured due to poor standing tolerance.  Orthostatic VS for the past 24 hrs:  BP- Lying Pulse- Lying BP- Sitting Pulse- Sitting BP- Standing at 0 minutes  10/09/23 1157 121/71 96 123/88 106 121/90      Pt making good progress toward goals, will continue to follow POC. Discharge recommendation remains appropriate.        If plan is discharge home, recommend the following:  Two people to help with walking and/or transfers;Two people to help with bathing/dressing/bathroom;Supervision due to cognitive status   Equipment Recommendations  Other (comment)    Recommendations for Other Services      Precautions / Restrictions Precautions Precautions: Fall Recall of Precautions/Restrictions: Impaired Restrictions Weight Bearing Restrictions Per Provider Order: No       Mobility Bed Mobility Overal bed mobility: Needs Assistance Bed Mobility: Supine to Sit     Supine to sit: Mod assist, Used rails (+ HHA)          Transfers Overall transfer  level: Needs assistance Equipment used: Rolling walker (2 wheels) Transfers: Sit to/from Stand Sit to Stand: Min assist, +2 safety/equipment           General transfer comment: MIN A +2 for safety, progressing to MOD A +2 as pt fatigues     Balance Overall balance assessment: Needs assistance Sitting-balance support: Bilateral upper extremity supported, Feet supported Sitting balance-Leahy Scale: Poor Sitting balance - Comments: posterior lean while sitting EOB   Standing balance support: Bilateral upper extremity supported, Reliant on assistive device for balance Standing balance-Leahy Scale: Poor                             ADL either performed or assessed with clinical judgement   ADL Overall ADL's : Needs assistance/impaired                                       General ADL Comments: MIN A for seated grooming task; MIN A to don R sock while seated; MOD A for toilet t/f; MAX A for pericare    Extremity/Trunk Assessment Upper Extremity Assessment Upper Extremity Assessment: Generalized weakness   Lower Extremity Assessment Lower Extremity Assessment: Generalized weakness        Vision       Perception     Praxis     Communication Communication Communication: No apparent difficulties  Cognition Arousal: Alert Behavior During Therapy: WFL for tasks assessed/performed, Anxious Cognition: Cognition impaired             OT - Cognition Comments: some confusion, required reassurance,                 Following commands: Impaired Following commands impaired: Follows one step commands with increased time      Cueing   Cueing Techniques: Verbal cues, Gestural cues, Tactile cues  Exercises      Shoulder Instructions       General Comments      Pertinent Vitals/ Pain       Pain Assessment Pain Assessment: Faces Faces Pain Scale: No hurt  Home Living                                           Prior Functioning/Environment              Frequency  Min 2X/week        Progress Toward Goals  OT Goals(current goals can now be found in the care plan section)  Progress towards OT goals: Progressing toward goals  Acute Rehab OT Goals Patient Stated Goal: to go home OT Goal Formulation: With patient Time For Goal Achievement: 10/23/23 Potential to Achieve Goals: Good ADL Goals Pt Will Perform Grooming: with supervision;sitting Pt Will Perform Lower Body Dressing: with mod assist Pt Will Transfer to Toilet: with mod assist;stand pivot transfer Pt Will Perform Toileting - Clothing Manipulation and hygiene: with mod assist  Plan      Co-evaluation    PT/OT/SLP Co-Evaluation/Treatment: Yes Reason for Co-Treatment: For patient/therapist safety;To address functional/ADL transfers   OT goals addressed during session: ADL's and self-care      AM-PAC OT 6 Clicks Daily Activity     Outcome Measure   Help from another person eating meals?: A Little Help from another person taking care of personal grooming?: A Little Help from another person toileting, which includes using toliet, bedpan, or urinal?: A Lot Help from another person bathing (including washing, rinsing, drying)?: A Lot Help from another person to put on and taking off regular upper body clothing?: A Lot Help from another person to put on and taking off regular lower body clothing?: A Lot 6 Click Score: 14    End of Session    OT Visit Diagnosis: Other abnormalities of gait and mobility (R26.89);Muscle weakness (generalized) (M62.81)   Activity Tolerance     Patient Left     Nurse Communication          Time: 8960-8871 OT Time Calculation (min): 49 min  Charges: OT General Charges $OT Visit: 1 Visit OT Treatments $Self Care/Home Management : 23-37 mins  Brittany Benton, Student OT   [employer] 10/09/2023, 12:28 PM

## 2023-10-09 NOTE — TOC Transition Note (Signed)
 Transition of Care [facility]) - Discharge Note   Patient Details  Name: Brittany Benton MRN: [id number] Date of Birth: 08/13/1955  [facility]) CM/SW Contact:  Luann SHAUNNA Cumming, LCSW Phone Number: 10/09/2023, 1:14 PM   Clinical Narrative:     Auth approved 6/25- 6/27 Auth# [id number]  Per MD patient ready for DC to [facility].  RN, patient, patient's family, and facility notified of DC. Discharge Summary and FL2 sent to facility. RN to call report prior to discharge [phone] ). DC packet on chart. Ambulance transport requested for patient.   CSW will sign off for now as social work intervention is no longer needed. Please consult us  again if new needs arise.   Final next level of care: Skilled Nursing Facility Barriers to Discharge: No Barriers Identified   Patient Goals and CMS Choice      [facility]      Discharge Placement              Patient chooses bed at: [facility] Patient to be transferred to facility by: [facility] Name of family member notified: Spouse [name] Patient and family notified of of transfer: 10/09/23  Discharge Plan and Services Additional resources added to the After Visit Summary for                                       Social Drivers of Health (SDOH) Interventions SDOH Screenings   Food Insecurity: No Food Insecurity (10/06/2023)  Housing: Low Risk  (10/06/2023)  Transportation Needs: No Transportation Needs (10/06/2023)  Utilities: Not At Risk (10/06/2023)  Alcohol Screen: Low Risk  (03/30/2022)  Depression (PHQ2-9): Low Risk  (10/02/2023)  Recent Concern: Depression (PHQ2-9) - High Risk (09/12/2023)  Financial Resource Strain: Low Risk  (09/20/2023)  Physical Activity: Insufficiently Active (11/14/2022)  Social Connections: Socially Isolated (10/06/2023)  Stress: No Stress Concern Present (11/14/2022)  Tobacco Use: Low Risk  (10/09/2023)  Health Literacy: Adequate Health Literacy (11/14/2022)     Readmission Risk  Interventions     No data to display

## 2023-10-09 NOTE — Progress Notes (Signed)
 Mobility Specialist - Progress Note   10/09/23 1100  Mobility  Activity Refused mobility     Pt fatigued and just finished PT/OT session upon arrival. Will attempt another date/time.    Lennette Seip Mobility Specialist 10/09/23, 11:56 AM

## 2023-10-09 NOTE — Care Management Important Message (Signed)
 Important Message  Patient Details  Name: Brittany Benton MRN: [id number] Date of Birth: [birth year]   Important Message Given:  Yes - Medicare IM     Iyan Flett W, CMA 10/09/2023, 2:27 PM

## 2023-10-09 NOTE — Progress Notes (Signed)
 Pt report called to [facility] to [facility]  all questions answered.

## 2023-10-09 NOTE — Progress Notes (Signed)
 Great tolerance for session, ModA of 2 for safe transfers, no true gait training due to fatigue. Recommend use of Stedy for transfers with nursing staff.   10/09/23 1600  PT Visit Information  Assistance Needed +2  PT/OT/SLP Co-Evaluation/Treatment Yes  Reason for Co-Treatment For patient/therapist safety;To address functional/ADL transfers  PT goals addressed during session Mobility/safety with mobility;Balance;Proper use of DME  OT goals addressed during session ADL's and self-care  History of Present Illness Pt is a 68 year old female admitted with frequent falls with possible UTI as well as  degenerative changes related to Parkinson's disease, orthostatic hypotension     PMH significant for arkinson's disease , HTN and hypothyroidism, prior UTIs  Subjective Data  Subjective I was on the walker at home  Patient Stated Goal stop falling  Precautions  Precautions Fall  Recall of Precautions/Restrictions Impaired  Restrictions  Weight Bearing Restrictions Per Provider Order No  Pain Assessment  Pain Assessment No/denies pain  Cognition  Arousal Alert  Behavior During Therapy [facility] for tasks assessed/performed;Anxious  PT - Cognitive impairments History of cognitive impairments;Memory  Following Commands  Following commands Impaired  Following commands impaired Follows one step commands with increased time  Cueing  Cueing Techniques Verbal cues;Gestural cues;Tactile cues  Communication  Communication No apparent difficulties  Bed Mobility  Overal bed mobility Needs Assistance  Bed Mobility Supine to Sit  Supine to sit Mod assist;Used rails  Transfers  Overall transfer level Needs assistance  Equipment used Rolling walker (2 wheels)  Transfers Sit to/from Stand  Sit to Stand Min assist;+2 safety/equipment  General transfer comment MIN A +2 for safety, progressing to MOD A +2 as pt fatigues. Pt stood at [facility] several times while bed, BSC, and chair were switched out behind her   Ambulation/Gait  General Gait Details unable to due to limited standing tolerance and generalized weakness  Balance  Overall balance assessment Needs assistance  Sitting-balance support Bilateral upper extremity supported;Feet supported  Sitting balance-Leahy Scale Poor  Sitting balance - Comments posterior lean while sitting EOB  Standing balance support Bilateral upper extremity supported;Reliant on assistive device for balance  Standing balance-Leahy Scale Poor  Standing balance comment external support required to maintain standing balance. standing tolerance limited by fatigue  PT - End of Session  Equipment Utilized During Treatment Gait belt  Activity Tolerance Patient tolerated treatment well  Patient left in chair;with call Boline/phone within reach;with chair alarm set;with family/visitor present  Nurse Communication Mobility status   PT - Assessment/Plan  PT Visit Diagnosis Unsteadiness on feet (R26.81);Difficulty in walking, not elsewhere classified (R26.2);Repeated falls (R29.6)  PT Frequency (ACUTE ONLY) Min 1X/week  Follow Up Recommendations Skilled nursing-short term rehab (<3 hours/day)  Can patient physically be transported by private vehicle No  Patient can return home with the following A lot of help with bathing/dressing/bathroom;Assist for transportation;Help with stairs or ramp for entrance;Assistance with cooking/housework  PT equipment None recommended by PT  AM-PAC PT 6 Clicks Mobility Outcome Measure (Version 2)  Help needed turning from your back to your side while in a flat bed without using bedrails? 3  Help needed moving from lying on your back to sitting on the side of a flat bed without using bedrails? 3  Help needed moving to and from a bed to a chair (including a wheelchair)? 2  Help needed standing up from a chair using your arms (e.g., wheelchair or bedside chair)? 2  Help needed to walk in hospital room? 1  Help needed climbing  3-5 steps with a  railing?  1  6 Click Score 12  Consider Recommendation of Discharge To: CIR/SNF/LTACH  Progressive Mobility  What is the highest level of mobility based on the progressive mobility assessment? Level 2 (Chairfast) - Balance while sitting on edge of bed and cannot stand  Activity Stood at bedside  PT Goal Progression  Progress towards PT goals Progressing toward goals  PT Time Calculation  PT Start Time (ACUTE ONLY) 1042  PT Stop Time (ACUTE ONLY) 1129  PT Time Calculation (min) (ACUTE ONLY) 47 min  PT General Charges  $$ ACUTE PT VISIT 1 Visit  PT Treatments  $Therapeutic Activity 8-22 mins  Darice Bohr, PTA 10/09/2023

## 2023-10-10 ENCOUNTER — Encounter: Payer: Medicare Other | Admitting: Speech Pathology

## 2023-10-10 ENCOUNTER — Ambulatory Visit: Payer: Medicare Other | Admitting: Physical Therapy

## 2023-10-10 DIAGNOSIS — M6281 Muscle weakness (generalized): Secondary | ICD-10-CM | POA: Diagnosis not present

## 2023-10-10 DIAGNOSIS — G20A1 Parkinson's disease without dyskinesia, without mention of fluctuations: Secondary | ICD-10-CM | POA: Diagnosis not present

## 2023-10-10 DIAGNOSIS — R296 Repeated falls: Secondary | ICD-10-CM | POA: Diagnosis not present

## 2023-10-10 DIAGNOSIS — I951 Orthostatic hypotension: Secondary | ICD-10-CM | POA: Diagnosis not present

## 2023-10-11 ENCOUNTER — Inpatient Hospital Stay: Payer: Medicare Other

## 2023-10-11 ENCOUNTER — Inpatient Hospital Stay: Payer: Medicare Other | Admitting: Oncology

## 2023-10-11 ENCOUNTER — Inpatient Hospital Stay: Payer: Medicare Other | Attending: Internal Medicine

## 2023-10-13 DIAGNOSIS — I951 Orthostatic hypotension: Secondary | ICD-10-CM | POA: Diagnosis not present

## 2023-10-13 DIAGNOSIS — M6281 Muscle weakness (generalized): Secondary | ICD-10-CM | POA: Diagnosis not present

## 2023-10-13 DIAGNOSIS — R296 Repeated falls: Secondary | ICD-10-CM | POA: Diagnosis not present

## 2023-10-13 DIAGNOSIS — G20A1 Parkinson's disease without dyskinesia, without mention of fluctuations: Secondary | ICD-10-CM | POA: Diagnosis not present

## 2023-10-14 DIAGNOSIS — G20A1 Parkinson's disease without dyskinesia, without mention of fluctuations: Secondary | ICD-10-CM | POA: Diagnosis not present

## 2023-10-14 DIAGNOSIS — Z5189 Encounter for other specified aftercare: Secondary | ICD-10-CM | POA: Diagnosis not present

## 2023-10-14 DIAGNOSIS — R296 Repeated falls: Secondary | ICD-10-CM | POA: Diagnosis not present

## 2023-10-14 DIAGNOSIS — I951 Orthostatic hypotension: Secondary | ICD-10-CM | POA: Diagnosis not present

## 2023-10-14 DIAGNOSIS — M6281 Muscle weakness (generalized): Secondary | ICD-10-CM | POA: Diagnosis not present

## 2023-10-15 ENCOUNTER — Ambulatory Visit: Payer: Medicare Other | Admitting: Physical Therapy

## 2023-10-15 ENCOUNTER — Encounter: Payer: Medicare Other | Admitting: Speech Pathology

## 2023-10-15 DIAGNOSIS — R2689 Other abnormalities of gait and mobility: Secondary | ICD-10-CM | POA: Diagnosis not present

## 2023-10-15 DIAGNOSIS — M6281 Muscle weakness (generalized): Secondary | ICD-10-CM | POA: Diagnosis not present

## 2023-10-15 DIAGNOSIS — I959 Hypotension, unspecified: Secondary | ICD-10-CM | POA: Diagnosis not present

## 2023-10-15 DIAGNOSIS — G20A1 Parkinson's disease without dyskinesia, without mention of fluctuations: Secondary | ICD-10-CM | POA: Diagnosis not present

## 2023-10-16 DIAGNOSIS — M6281 Muscle weakness (generalized): Secondary | ICD-10-CM | POA: Diagnosis not present

## 2023-10-16 DIAGNOSIS — I951 Orthostatic hypotension: Secondary | ICD-10-CM | POA: Diagnosis not present

## 2023-10-16 DIAGNOSIS — R296 Repeated falls: Secondary | ICD-10-CM | POA: Diagnosis not present

## 2023-10-16 DIAGNOSIS — G20A1 Parkinson's disease without dyskinesia, without mention of fluctuations: Secondary | ICD-10-CM | POA: Diagnosis not present

## 2023-10-16 DIAGNOSIS — Z5189 Encounter for other specified aftercare: Secondary | ICD-10-CM | POA: Diagnosis not present

## 2023-10-17 ENCOUNTER — Ambulatory Visit: Payer: Medicare Other | Admitting: Physical Therapy

## 2023-10-17 ENCOUNTER — Encounter: Payer: Medicare Other | Admitting: Speech Pathology

## 2023-10-17 DIAGNOSIS — G20B1 Parkinson's disease with dyskinesia, without mention of fluctuations: Secondary | ICD-10-CM | POA: Diagnosis not present

## 2023-10-17 DIAGNOSIS — Z5189 Encounter for other specified aftercare: Secondary | ICD-10-CM | POA: Diagnosis not present

## 2023-10-17 DIAGNOSIS — R296 Repeated falls: Secondary | ICD-10-CM | POA: Diagnosis not present

## 2023-10-21 DIAGNOSIS — G20B1 Parkinson's disease with dyskinesia, without mention of fluctuations: Secondary | ICD-10-CM | POA: Diagnosis not present

## 2023-10-21 DIAGNOSIS — Z5189 Encounter for other specified aftercare: Secondary | ICD-10-CM | POA: Diagnosis not present

## 2023-10-21 DIAGNOSIS — W19XXXA Unspecified fall, initial encounter: Secondary | ICD-10-CM | POA: Diagnosis not present

## 2023-10-21 DIAGNOSIS — Z993 Dependence on wheelchair: Secondary | ICD-10-CM | POA: Diagnosis not present

## 2023-10-22 ENCOUNTER — Encounter: Payer: Medicare Other | Admitting: Speech Pathology

## 2023-10-22 ENCOUNTER — Telehealth: Payer: Self-pay

## 2023-10-22 ENCOUNTER — Ambulatory Visit: Payer: Medicare Other | Admitting: Physical Therapy

## 2023-10-22 ENCOUNTER — Other Ambulatory Visit: Payer: Self-pay

## 2023-10-22 DIAGNOSIS — I959 Hypotension, unspecified: Secondary | ICD-10-CM | POA: Diagnosis not present

## 2023-10-22 DIAGNOSIS — G20A1 Parkinson's disease without dyskinesia, without mention of fluctuations: Secondary | ICD-10-CM | POA: Diagnosis not present

## 2023-10-22 DIAGNOSIS — R2689 Other abnormalities of gait and mobility: Secondary | ICD-10-CM | POA: Diagnosis not present

## 2023-10-22 DIAGNOSIS — M6281 Muscle weakness (generalized): Secondary | ICD-10-CM | POA: Diagnosis not present

## 2023-10-22 NOTE — Progress Notes (Signed)
   10/22/2023  Patient ID: Brittany Benton, female   DOB: [birth year], 68 y.o.   MRN: [id number]  The patient was scheduled for a 1:30 PM appointment with the pharmacist today. However, she reported that she is still in a Skilled Nursing Facility but is expected to be discharged home later this week. Therefore, she requested to reschedule the appointment for next week.  Dorcas Solian, PharmD Clinical Pharmacist Cell: [phone]

## 2023-10-23 ENCOUNTER — Other Ambulatory Visit: Payer: Self-pay | Admitting: Family Medicine

## 2023-10-23 DIAGNOSIS — F410 Panic disorder [episodic paroxysmal anxiety] without agoraphobia: Secondary | ICD-10-CM

## 2023-10-23 DIAGNOSIS — I10 Essential (primary) hypertension: Secondary | ICD-10-CM

## 2023-10-24 DIAGNOSIS — G20B1 Parkinson's disease with dyskinesia, without mention of fluctuations: Secondary | ICD-10-CM | POA: Diagnosis not present

## 2023-10-24 DIAGNOSIS — Z5189 Encounter for other specified aftercare: Secondary | ICD-10-CM | POA: Diagnosis not present

## 2023-10-25 ENCOUNTER — Ambulatory Visit: Payer: Medicare Other | Admitting: Physical Therapy

## 2023-10-25 ENCOUNTER — Encounter: Payer: Medicare Other | Admitting: Speech Pathology

## 2023-10-25 DIAGNOSIS — R2689 Other abnormalities of gait and mobility: Secondary | ICD-10-CM | POA: Diagnosis not present

## 2023-10-25 DIAGNOSIS — G20A1 Parkinson's disease without dyskinesia, without mention of fluctuations: Secondary | ICD-10-CM | POA: Diagnosis not present

## 2023-10-25 DIAGNOSIS — I959 Hypotension, unspecified: Secondary | ICD-10-CM | POA: Diagnosis not present

## 2023-10-25 DIAGNOSIS — M6281 Muscle weakness (generalized): Secondary | ICD-10-CM | POA: Diagnosis not present

## 2023-10-29 ENCOUNTER — Encounter: Payer: Medicare Other | Admitting: Speech Pathology

## 2023-10-29 ENCOUNTER — Ambulatory Visit: Payer: Medicare Other | Admitting: Physical Therapy

## 2023-10-29 DIAGNOSIS — R2689 Other abnormalities of gait and mobility: Secondary | ICD-10-CM | POA: Diagnosis not present

## 2023-10-29 DIAGNOSIS — I959 Hypotension, unspecified: Secondary | ICD-10-CM | POA: Diagnosis not present

## 2023-10-29 DIAGNOSIS — G20A1 Parkinson's disease without dyskinesia, without mention of fluctuations: Secondary | ICD-10-CM | POA: Diagnosis not present

## 2023-10-29 DIAGNOSIS — M6281 Muscle weakness (generalized): Secondary | ICD-10-CM | POA: Diagnosis not present

## 2023-10-29 NOTE — Telephone Encounter (Signed)
 Copied from CRM [id number]. Topic: Appointments - Appointment Scheduling >> Oct 29, 2023  2:30 PM Delon DASEN wrote: Lavanda- social worker with [facility] [phone]- Patient has had several neuro psych related issues and crisis line was utilized- adjusted medications and doing better  [facility]

## 2023-10-30 DIAGNOSIS — G20B1 Parkinson's disease with dyskinesia, without mention of fluctuations: Secondary | ICD-10-CM | POA: Diagnosis not present

## 2023-10-30 DIAGNOSIS — Z5189 Encounter for other specified aftercare: Secondary | ICD-10-CM | POA: Diagnosis not present

## 2023-10-31 ENCOUNTER — Other Ambulatory Visit: Payer: Self-pay

## 2023-10-31 ENCOUNTER — Encounter: Payer: Medicare Other | Admitting: Speech Pathology

## 2023-10-31 ENCOUNTER — Ambulatory Visit: Payer: Medicare Other | Admitting: Physical Therapy

## 2023-10-31 ENCOUNTER — Telehealth: Payer: Self-pay

## 2023-10-31 NOTE — Transitions of Care (Post Inpatient/ED Visit) (Unsigned)
   10/31/2023  Name: Brittany Benton MRN: [id number] DOB: 08/22/1955  Today's TOC FU Call Status: Today's TOC FU Call Status:: Unsuccessful Call (1st Attempt) Unsuccessful Call (1st Attempt) Date: 10/31/23  Attempted to reach the patient regarding the most recent Inpatient/ED visit.  Follow Up Plan: Additional outreach attempts will be made to reach the patient to complete the Transitions of Care (Post Inpatient/ED visit) call.   Signature Julian Lemmings, LPN [facility] Nurse Health Advisor Direct Dial [phone]

## 2023-10-31 NOTE — Progress Notes (Addendum)
 10/31/2023 Name: Brittany Benton MRN: [id number] DOB: 09/17/1955  No chief complaint on file.   Brittany Benton is a 68 y.o. year old female who presented for a telephone visit.   They were referred to the pharmacist by their PCP for assistance in managing Parkinson's Disease and polypharmacy. Brittany Benton confirmed to proceed with the appointment today when asked, due to uncertainty when patient was discharged from the rehabilitation facility.    Subjective:  Care Team: Primary Care Provider: [name], MD    Medication Access/Adherence  Current Pharmacy:  [facility] - [place], [place] - [address] [place] [address] Phone: [phone] Fax: [phone]   Patient reports affordability concerns with their medications: No  Patient reports access/transportation concerns to their pharmacy: No  Patient reports adherence concerns with their medications:  No    When I called the patient today, she was accompanied by her sister [name], in which she gave consent, who completed the medication as the patient did not feel up to doing due to being easily confused. After completing the medication review, Brittany Benton noticeably was slow with speech and very lethargic - she fell asleep while on the phone. Inquired about the patient's change and the family attributed to patient recently being discharged yesterday and being up all night. It was suggested by pharmacist to reschedule appointment. [name] did state that Brittany Benton has been very lethargic and slow to speak while at the facility and discharged and since discharged.    Medication Review: Discussed administration of midodrine  Beers List Medications Clonazepam - Per sister, patient was switched from Xanax  PRN as it was not working for the patient and reportedly was having hallucinations. Imipramine  - not interested in stopping medication/therapeutic substitution based on previous discussion Omeprazole  -  feels she would like to remain on medication based on prior discussion  Objective:  Lab Results  Component Value Date   HGBA1C 5.2 07/23/2023    Lab Results  Component Value Date   CREATININE 0.72 10/09/2023   BUN 6 (L) 10/09/2023   NA 138 10/09/2023   K 3.5 10/09/2023   CL 105 10/09/2023   CO2 24 10/09/2023    Lab Results  Component Value Date   CHOL 148 07/23/2023   HDL 56 07/23/2023   LDLCALC 66 07/23/2023   TRIG 152 (H) 07/23/2023   CHOLHDL 2.6 07/23/2023    Medications Reviewed Today     Reviewed by Cleatus Dorcas SAUNDERS, RPH (Pharmacist) on 10/31/23 at 1354  Med List Status: <None>   Medication Order Taking? Sig Documenting Provider Last Dose Status Informant   Patient not taking:   Discontinued 10/31/23 1349 (Discontinued by provider)            Med Note>> Cleatus Dorcas SAUNDERS, [facility]   10/31/2023  1:49 PM D/c'd in long tem care    Ascorbic Acid (VITAMIN C PO) [id number]  Take 1 tablet by mouth daily.  Patient not taking: Reported on 10/31/2023   [provider]  Active Self  atorvastatin  (LIPITOR) 20 MG tablet [id number] Yes TAKE 1 TABLET BY MOUTH EVERY EVENING [name], MD  Active Self  Carbidopa -Levodopa  ER (SINEMET  CR) 25-100 MG tablet controlled release [id number] Yes Take 1 tablet by mouth 2 (two) times daily. [name], MD  Active Self           Med Note MAYER, [facility] R   Thu Sep 19, 2023 10:43 AM) O/v on 08/06/23 :  Dr. Maree noted to slowly increase to eventually 2 tablets twice daily. Patient is taking 1 tablet twice daily due to intolerance  Carbidopa -Levodopa  ER (SINEMET  CR) 25-100 MG tablet controlled release [id number]  Take 2 tablets by mouth.  Patient not taking: Reported on 10/31/2023   [provider]  Consider Medication Status and Discontinue (Duplicate)   Cholecalciferol  (VITAMIN D -3) 1000 units CAPS [id number] Yes Take 1 capsule by mouth daily. [provider]  Active Self  imipramine  (TOFRANIL ) 50 MG tablet [id number] Yes  TAKE ONE TABLET BY MOUTH TWICE DAILY Gasper Nancyann BRAVO, MD  Active Self  ketoconazole  (NIZORAL ) 2 % cream [id number] Yes Apply 1 Application topically daily. Until rash resolves completely. Ostwalt, Janna, PA-C  Active Self  KLONOPIN 0.5 MG tablet [id number] Yes Take 0.5 mg by mouth 2 (two) times daily. [provider]  Active   levothyroxine  (SYNTHROID ) 88 MCG tablet [id number] Yes Take 0.5 tablets (44 mcg total) by mouth daily. Donzella [name], DO  Active Self  metoprolol  succinate (TOPROL -XL) 50 MG 24 hr tablet [id number]  TAKE ONE TABLET BY MOUTH ONCE DAILY WITHOR IMMEDIATELY FOLLOWING A MEAL  Patient not taking: Reported on 10/31/2023   Gasper Nancyann BRAVO, MD  Active Self  midodrine  (PROAMATINE ) 5 MG tablet [id number] Yes Take 1 tablet (5 mg total) by mouth 3 (three) times daily with meals. Hold if SBP > 120 mmHg and/or HR <65 Von Bellis, MD  Active   Multiple Vitamin (MULTIVITAMIN WITH MINERALS) TABS tablet [id number] Yes Take 1 tablet by mouth 2 (two) times daily.  Patient taking differently: Take 1 tablet by mouth 2 (two) times daily. Taking one tablet once daily   Von Bellis, MD  Active   omeprazole  (PRILOSEC) 20 MG capsule [id number] Yes TAKE 1 CAPSULE BY MOUTH ONCE DAILY Gasper Nancyann BRAVO, MD  Active Self  sertraline  (ZOLOFT ) 100 MG tablet [id number] Yes TAKE ONE TABLET BY MOUTH DAILY Gasper Nancyann BRAVO, MD  Active Self              Assessment/Plan:   Medication Management:  [name] has a history of falls 2/2 to Parkinson's Disease. Per medication review, patient is taking medications on the Beers List that could increase her risk of falls more than her baseline. Please assess future consideration of adjusting BZD (Lorazepam , Oxazepam, Temazepam) to one that are safer options due to their shorter half-lives and lack of active metabolites.   - Future consideration for therapeutic substitutions:  - Clonazepam (as well as all BZDs) may cause falls and fractures due to lasting  effects of medication  -Omeprazole  may increase the risk of bone loss and fractures ; may consider future considerations of on demand PPI therapy or switching to famotidine   - TCAs increase the risk of falls and fractures; will collaborate with PCP for future considerations of alternative therapy    Follow Up Plan: telephone with pharmacist in 1-2 weeks   Dorcas Solian, PharmD Clinical Pharmacist Cell: [phone]

## 2023-11-01 NOTE — Transitions of Care (Post Inpatient/ED Visit) (Signed)
 11/01/2023  Name: Brittany Benton MRN: [id number] DOB: [birth year]  Today's TOC FU Call Status: Today's TOC FU Call Status:: Successful TOC FU Call Completed Unsuccessful Call (1st Attempt) Date: 10/31/23 [facility] FU Call Complete Date: 11/01/23 Patient's Name and Date of Birth confirmed.  Transition Care Management Follow-up Telephone Call Date of Discharge: 10/30/23 Discharge Facility: Other [facility]) Name of Other (Non[facility]) Discharge Facility: [facility] Type of Discharge: Inpatient Admission Primary Inpatient Discharge Diagnosis:: weakness How have you been since you were released from the hospital?: Better Any questions or concerns?: No  Items Reviewed: Did you receive and understand the discharge instructions provided?: Yes Medications obtained,verified, and reconciled?: Yes (Medications Reviewed) Any new allergies since your discharge?: No Dietary orders reviewed?: Yes Do you have support at home?: Yes People in Home [RPT]: spouse  Medications Reviewed Today: Medications Reviewed Today     Reviewed by Emmitt Pan, LPN (Licensed Practical Nurse) on 11/01/23 at 1129  Med List Status: <None>   Medication Order Taking? Sig Documenting Provider Last Dose Status Informant  Ascorbic Acid (VITAMIN C PO) [id number]  Take 1 tablet by mouth daily.  Patient not taking: Reported on 11/01/2023   [provider]  Active Self  atorvastatin  (LIPITOR) 20 MG tablet [id number] Yes TAKE 1 TABLET BY MOUTH EVERY EVENING [name], MD  Active Self  Carbidopa -Levodopa  ER (SINEMET  CR) 25-100 MG tablet controlled release [id number] Yes Take 1 tablet by mouth 2 (two) times daily. [name], MD  Active Self           Med Note MAYER, [facility] R   Thu Sep 19, 2023 10:43 AM) O/v on 08/06/23 : Dr. Maree noted to slowly increase to eventually 2 tablets twice daily. Patient is taking 1 tablet twice daily due to intolerance  Carbidopa -Levodopa  ER (SINEMET  CR) 25-100 MG tablet  controlled release [id number]  Take 2 tablets by mouth.  Patient not taking: Reported on 11/01/2023   [provider]  Active   Cholecalciferol  (VITAMIN D -3) 1000 units CAPS [id number] Yes Take 1 capsule by mouth daily. [provider]  Active Self  imipramine  (TOFRANIL ) 50 MG tablet [id number] Yes TAKE ONE TABLET BY MOUTH TWICE DAILY [name], MD  Active Self  ketoconazole  (NIZORAL ) 2 % cream [id number] Yes Apply 1 Application topically daily. Until rash resolves completely. Ostwalt, Janna, PA-C  Active Self  KLONOPIN 0.5 MG tablet [id number] Yes Take 0.5 mg by mouth 2 (two) times daily. [provider]  Active   levothyroxine  (SYNTHROID ) 88 MCG tablet [id number] Yes Take 0.5 tablets (44 mcg total) by mouth daily. Donzella [name], DO  Active Self  metoprolol  succinate (TOPROL -XL) 50 MG 24 hr tablet [id number]  TAKE ONE TABLET BY MOUTH ONCE DAILY WITHOR IMMEDIATELY FOLLOWING A MEAL  Patient not taking: Reported on 11/01/2023   [name], MD  Active Self  midodrine  (PROAMATINE ) 5 MG tablet [id number] Yes Take 1 tablet (5 mg total) by mouth 3 (three) times daily with meals. Hold if SBP > 120 mmHg and/or HR <65 Von Bellis, MD  Active   Multiple Vitamin (MULTIVITAMIN WITH MINERALS) TABS tablet [id number] Yes Take 1 tablet by mouth 2 (two) times daily.  Patient taking differently: Take 1 tablet by mouth 2 (two) times daily. Taking one tablet once daily   Von Bellis, MD  Active   omeprazole  (PRILOSEC) 20 MG capsule [id number] Yes TAKE 1 CAPSULE BY MOUTH ONCE DAILY [name], MD  Active Self  sertraline  (ZOLOFT ) 100 MG  tablet [id number] Yes TAKE ONE TABLET BY MOUTH DAILY Gasper Nancyann BRAVO, MD  Active Self            Home Care and Equipment/Supplies: Were Home Health Services Ordered?: Yes Name of Home Health Agency:: unknown Has Agency set up a time to come to your home?: Yes First Home Health Visit Date: 11/04/23 Any new equipment or medical supplies  ordered?: Yes Name of Medical supply agency?: unknown Were you able to get the equipment/medical supplies?: No Do you have any questions related to the use of the equipment/supplies?: No  Functional Questionnaire: Do you need assistance with bathing/showering or dressing?: Yes Do you need assistance with meal preparation?: Yes Do you need assistance with eating?: No Do you have difficulty maintaining continence: No Do you need assistance with getting out of bed/getting out of a chair/moving?: Yes Do you have difficulty managing or taking your medications?: Yes  Follow up appointments reviewed: PCP Follow-up appointment confirmed?: Yes Date of PCP follow-up appointment?: 11/04/23 Follow-up Provider: [facility] Follow-up appointment confirmed?: Yes Date of Specialist follow-up appointment?: 11/22/23 Follow-Up Specialty Provider:: neuro Do you need transportation to your follow-up appointment?: No Do you understand care options if your condition(s) worsen?: Yes-patient verbalized understanding    SIGNATURE Julian Lemmings, LPN [facility] Nurse Health Advisor Direct Dial [phone]

## 2023-11-04 ENCOUNTER — Ambulatory Visit (INDEPENDENT_AMBULATORY_CARE_PROVIDER_SITE_OTHER): Admitting: Family Medicine

## 2023-11-04 ENCOUNTER — Encounter: Payer: Self-pay | Admitting: Family Medicine

## 2023-11-04 VITALS — BP 117/75 | HR 86 | Temp 97.4°F | Ht 60.0 in

## 2023-11-04 DIAGNOSIS — K219 Gastro-esophageal reflux disease without esophagitis: Secondary | ICD-10-CM

## 2023-11-04 DIAGNOSIS — N390 Urinary tract infection, site not specified: Secondary | ICD-10-CM

## 2023-11-04 DIAGNOSIS — E559 Vitamin D deficiency, unspecified: Secondary | ICD-10-CM

## 2023-11-04 DIAGNOSIS — R531 Weakness: Secondary | ICD-10-CM

## 2023-11-04 DIAGNOSIS — L304 Erythema intertrigo: Secondary | ICD-10-CM

## 2023-11-04 DIAGNOSIS — R300 Dysuria: Secondary | ICD-10-CM

## 2023-11-04 DIAGNOSIS — F419 Anxiety disorder, unspecified: Secondary | ICD-10-CM

## 2023-11-04 LAB — POCT URINALYSIS DIPSTICK
Bilirubin, UA: NEGATIVE
Blood, UA: POSITIVE
Glucose, UA: NEGATIVE
Ketones, UA: NEGATIVE
Nitrite, UA: POSITIVE
Odor: ABNORMAL
Protein, UA: NEGATIVE
Spec Grav, UA: 1.01 (ref 1.010–1.025)
Urobilinogen, UA: 0.2 U/dL
pH, UA: 6 (ref 5.0–8.0)

## 2023-11-04 MED ORDER — FAMOTIDINE 20 MG PO TABS: 20.0000 mg | ORAL_TABLET | Freq: Every evening | ORAL | 1 refills | Status: AC

## 2023-11-04 MED ORDER — KETOCONAZOLE 2 % EX CREA: 1.0000 | TOPICAL_CREAM | Freq: Every day | CUTANEOUS | 3 refills | Status: DC

## 2023-11-04 MED ORDER — CEFDINIR 300 MG PO CAPS: 300.0000 mg | ORAL_CAPSULE | Freq: Two times a day (BID) | ORAL | 0 refills | Status: AC

## 2023-11-04 NOTE — Patient Instructions (Signed)
 Marland Kitchen  Please review the attached list of medications and notify my office if there are any errors.   . Please bring all of your medications to every appointment so we can make sure that our medication list is the same as yours.

## 2023-11-04 NOTE — Progress Notes (Addendum)
 Established patient visit   Patient: Brittany Benton   DOB: June 02, 1955   68 y.o. Female  MRN: [id number] Visit Date: 11/04/2023  Today's healthcare provider: Nancyann Perry, MD   Chief Complaint  Patient presents with   Follow-up    [facility] admitted from 6/21-6/24 dx acute uti, weakness, frequent falls. Rehab x 21 d   Anxiety    Patient has had a lot of anxiety during rehab, changed medication from alprazolam  to clonazepam and had helped. Patient says since then she has had issues with acid reflux    Urinary Tract Infection    Burning with urination, vaginal itching. Patient says she also feels restless and confused with having a uti   Medication Problem    Ketoconazoe cream- states they have misplaced it and need a refill o it    Subjective    Discussed the use of AI scribe software for clinical note transcription with the patient, who gave verbal consent to proceed.  History of Present Illness   Brittany Benton is a 68 year old female with hypertension who presents for follow-up after a recent hospital admission for a generalized weakness and urinary tract infection.  She was admitted to the hospital from June 21st to June 25th due to extreme generalized weakness, being unable to stand or even sit up on her own. Hospital evaluation found her to have UTI and hypotension.  During her stay, she received IV antibiotics and was discharged to a rehabilitation facility due to generalized weakness. She experienced frequent falls and had difficulty getting up, which led to her hospital admission. She was discharged from rehab last 5 days ago and is adjusting to being back home, though she finds the new routine stressful.  During her hospital stay, her medication regimen was adjusted. She was taken off metoprolol  due to low blood pressure and started on midodrine  to help increase her blood pressure. However, she has not taken midodrine  in the past week as her blood pressure has been  stable. She is currently not on metoprolol .  She has a history of anxiety and is currently taking sertraline  100 mg. Her alprazolam  was switched to clonazepam during her rehab stay, which she takes twice daily. She experiences acid reflux, which she attributes to the clonazepam, despite taking Prilosec regularly. She has been taking famotidine  at night to help manage this.  She has a history of three urinary tract infections over many years, with the most recent one leading to her hospital admission. No fever, chills, or sweats recently. A recent urinalysis indicated a persistent infection.  She also reports itching in her 'sweaty areas,' which she attributes to a yeast infection. She has used ketoconazole  cream in the past, which cleared up the infection significantly, but she misplaced the medication and the symptoms have returned.  Her nutrition is a concern as she reports limited food intake, including only small amounts of yogurt, grits, and frittata. She drinks about a quart of liquid daily, which is less than recommended. She is not losing weight but acknowledges the need to improve her nutrition.       Medications: Outpatient Medications Prior to Visit  Medication Sig Note   atorvastatin  (LIPITOR) 20 MG tablet TAKE 1 TABLET BY MOUTH EVERY EVENING    Carbidopa -Levodopa  ER (SINEMET  CR) 25-100 MG tablet controlled release Take 1 tablet by mouth 2 (two) times daily.    Cholecalciferol  (VITAMIN D -3) 1000 units CAPS Take 1 capsule by mouth daily.  imipramine  (TOFRANIL ) 50 MG tablet TAKE ONE TABLET BY MOUTH TWICE DAILY    KLONOPIN 0.5 MG tablet Take 0.5 mg by mouth 2 (two) times daily.    levothyroxine  (SYNTHROID ) 88 MCG tablet Take 0.5 tablets (44 mcg total) by mouth daily.    midodrine  (PROAMATINE ) 5 MG tablet Take 1 tablet (5 mg total) by mouth 3 (three) times daily with meals. Hold if SBP > 120 mmHg and/or HR <65 Not taking   Multiple Vitamin (MULTIVITAMIN WITH MINERALS) TABS tablet Take  1 tablet by mouth 2 (two) times daily. (Patient taking differently: Take 1 tablet by mouth daily. Taking one tablet once daily)    omeprazole  (PRILOSEC) 20 MG capsule TAKE 1 CAPSULE BY MOUTH ONCE DAILY    sertraline  (ZOLOFT ) 100 MG tablet TAKE ONE TABLET BY MOUTH DAILY    ketoconazole  (NIZORAL ) 2 % cream Apply 1 Application topically daily. Until rash resolves completely.    Ascorbic Acid (VITAMIN C PO) Take 1 tablet by mouth daily. (Patient not taking: Reported on 11/04/2023)    Carbidopa -Levodopa  ER (SINEMET  CR) 25-100 MG tablet controlled release Take 2 tablets by mouth. (Patient not taking: Reported on 11/04/2023)    [DISCONTINUED] metoprolol  succinate (TOPROL -XL) 50 MG 24 hr tablet TAKE ONE TABLET BY MOUTH ONCE DAILY WITHOR IMMEDIATELY FOLLOWING A MEAL (Patient not taking: Reported on 11/01/2023) 11/04/2023: no longer needed   No facility-administered medications prior to visit.   Review of Systems     Objective    BP 117/75 (BP Location: Left Arm, Patient Position: Sitting, Cuff Size: Normal)   Pulse 86   Temp (!) 97.4 F (36.3 C) (Oral)   Ht 5' (1.524 m)   BMI 31.25 kg/m   Physical Exam   General: Appearance:    Mildly obese female in no acute distress  Eyes:    PERRL, conjunctiva/corneas clear, EOM's intact       Lungs:     Clear to auscultation bilaterally, respirations unlabored  Heart:    Normal heart rate. Normal rhythm. No murmurs, rubs, or gallops.    MS:   All extremities are intact.    Derm:   Intertrigo below breasts and waistline.   Neurologic:   Awake, alert, oriented x 3.         Results for orders placed or performed in visit on 11/04/23  POCT Urinalysis Dipstick  Result Value Ref Range   Color, UA yellow    Clarity, UA cloudy    Glucose, UA Negative Negative   Bilirubin, UA negative    Ketones, UA negative    Spec Grav, UA 1.010 1.010 - 1.025   Blood, UA positive- Large    pH, UA 6.0 5.0 - 8.0   Protein, UA Negative Negative   Urobilinogen, UA 0.2 0.2  or 1.0 E.U./dL   Nitrite, UA positive    Leukocytes, UA Large (3+) (A) Negative   Appearance cloudy    Odor abnormal     Assessment & Plan    1. Recurrent UTI (Primary) Lead to hospitalization and 6 weeks in rehab, now with another UTI today.  - cefdinir  (OMNICEF ) 300 MG capsule; Take 1 capsule (300 mg total) by mouth 2 (two) times daily for 7 days.  Dispense: 14 capsule; Refill: 0  - Urine Culture  Refer urology for recurrent UTI  2. Weakness  Multifactorial, but recent episode requiring hospitalization in setting of UTI.  - CBC - Comprehensive metabolic panel with GFR - Magnesium   Follow up neurology tomorrow as scheduled.  3. GERD without esophagitis Had been controlled on every day omeprazole  for a few yeas, but now having nocturnal symptoms. Will add famotidine  (PEPCID ) 20 MG tablet; Take 1 tablet (20 mg total) by mouth every evening. For acid reflux  Dispense: 90 tablet; Refill: 1  4. Vitamin D  deficiency  - VITAMIN D  25 Hydroxy (Vit-D Deficiency, Fractures)  5. Intertrigo Refill  - ketoconazole  (NIZORAL ) 2 % cream; Apply 1 Application topically daily. Until rash resolves completely.  Dispense: 60 g; Refill: 3  6. Anxiety Stable with change from alprazolam  to clonazepam since recent hospitalization.   Return in about 6 weeks (around 12/16/2023) for check up.     Nancyann Perry, MD  [facility] [phone] (phone) [phone] (fax)  [facility]

## 2023-11-04 NOTE — Addendum Note (Signed)
 Addended by: GASPER NANCYANN BRAVO on: 11/04/2023 12:45 PM   Modules accepted: Orders

## 2023-11-05 ENCOUNTER — Ambulatory Visit: Payer: Self-pay | Admitting: Family Medicine

## 2023-11-05 ENCOUNTER — Encounter: Payer: Medicare Other | Admitting: Speech Pathology

## 2023-11-05 ENCOUNTER — Telehealth: Payer: Self-pay

## 2023-11-05 ENCOUNTER — Ambulatory Visit: Payer: Medicare Other | Admitting: Physical Therapy

## 2023-11-05 DIAGNOSIS — N390 Urinary tract infection, site not specified: Secondary | ICD-10-CM

## 2023-11-05 DIAGNOSIS — F410 Panic disorder [episodic paroxysmal anxiety] without agoraphobia: Secondary | ICD-10-CM

## 2023-11-05 LAB — CBC
Hematocrit: 41.9 % (ref 34.0–46.6)
Hemoglobin: 13.4 g/dL (ref 11.1–15.9)
MCH: 28.1 pg (ref 26.6–33.0)
MCHC: 32 g/dL (ref 31.5–35.7)
MCV: 88 fL (ref 79–97)
Platelets: 230 x10E3/uL (ref 150–450)
RBC: 4.77 x10E6/uL (ref 3.77–5.28)
RDW: 13.7 % (ref 11.7–15.4)
WBC: 5.9 x10E3/uL (ref 3.4–10.8)

## 2023-11-05 LAB — COMPREHENSIVE METABOLIC PANEL WITH GFR
ALT: 11 IU/L (ref 0–32)
AST: 23 IU/L (ref 0–40)
Albumin: 3.5 g/dL — ABNORMAL LOW (ref 3.9–4.9)
Alkaline Phosphatase: 145 IU/L — ABNORMAL HIGH (ref 44–121)
BUN/Creatinine Ratio: 8 — ABNORMAL LOW (ref 12–28)
BUN: 7 mg/dL — ABNORMAL LOW (ref 8–27)
Bilirubin Total: 0.5 mg/dL (ref 0.0–1.2)
CO2: 24 mmol/L (ref 20–29)
Calcium: 8.8 mg/dL (ref 8.7–10.3)
Chloride: 104 mmol/L (ref 96–106)
Creatinine, Ser: 0.88 mg/dL (ref 0.57–1.00)
Globulin, Total: 2.3 g/dL (ref 1.5–4.5)
Glucose: 81 mg/dL (ref 70–99)
Potassium: 3.7 mmol/L (ref 3.5–5.2)
Sodium: 143 mmol/L (ref 134–144)
Total Protein: 5.8 g/dL — ABNORMAL LOW (ref 6.0–8.5)
eGFR: 71 mL/min/1.73 (ref >59)

## 2023-11-05 LAB — MAGNESIUM: Magnesium: 1.8 mg/dL (ref 1.6–2.3)

## 2023-11-05 LAB — VITAMIN D 25 HYDROXY (VIT D DEFICIENCY, FRACTURES): Vit D, 25-Hydroxy: 46.2 ng/mL (ref 30.0–100.0)

## 2023-11-06 ENCOUNTER — Encounter: Payer: Self-pay | Admitting: Family Medicine

## 2023-11-06 LAB — URINE CULTURE

## 2023-11-06 MED ORDER — DOXYCYCLINE HYCLATE 100 MG PO TABS: 100.0000 mg | ORAL_TABLET | Freq: Two times a day (BID) | ORAL | 0 refills | Status: AC

## 2023-11-07 ENCOUNTER — Ambulatory Visit: Payer: Medicare Other | Admitting: Physical Therapy

## 2023-11-07 ENCOUNTER — Telehealth: Payer: Self-pay

## 2023-11-07 ENCOUNTER — Encounter: Payer: Medicare Other | Admitting: Speech Pathology

## 2023-11-07 NOTE — Progress Notes (Signed)
 Complex Care Management Note  Care Guide Note 11/07/2023 Name: Brittany Benton MRN: [id number] DOB: March 23, 1956  Brittany Benton is a 68 y.o. year old female who sees Fisher, Nancyann FORBES, MD for primary care. I reached out to Montie FORBES Edison by phone today to offer complex care management services.  [name] was given information about Complex Care Management services today including:   The Complex Care Management services include support from the care team which includes your Nurse Care Manager, Clinical Social Worker, or Pharmacist.  The Complex Care Management team is here to help remove barriers to the health concerns and goals most important to you. Complex Care Management services are voluntary, and the patient may decline or stop services at any time by request to their care team member.   Complex Care Management Consent Status: Patient did not agree to participate in complex care management services at this time.  Follow up plan:  Patient will follow up with [facility] RNCM.   Encounter Outcome:  Patient Refused  Dreama Agent [facility], [facility] Management Assistant Direct Dial: [phone]  Fax: [phone]

## 2023-11-08 ENCOUNTER — Encounter: Payer: Self-pay | Admitting: *Deleted

## 2023-11-12 ENCOUNTER — Encounter: Payer: Medicare Other | Admitting: Speech Pathology

## 2023-11-12 ENCOUNTER — Ambulatory Visit: Payer: Medicare Other | Admitting: Physical Therapy

## 2023-11-13 DIAGNOSIS — I951 Orthostatic hypotension: Secondary | ICD-10-CM | POA: Diagnosis not present

## 2023-11-13 DIAGNOSIS — G20A1 Parkinson's disease without dyskinesia, without mention of fluctuations: Secondary | ICD-10-CM | POA: Diagnosis not present

## 2023-11-13 DIAGNOSIS — E538 Deficiency of other specified B group vitamins: Secondary | ICD-10-CM | POA: Diagnosis not present

## 2023-11-14 ENCOUNTER — Ambulatory Visit: Payer: Self-pay

## 2023-11-14 ENCOUNTER — Encounter: Payer: Medicare Other | Admitting: Speech Pathology

## 2023-11-14 ENCOUNTER — Other Ambulatory Visit: Payer: Self-pay

## 2023-11-14 ENCOUNTER — Ambulatory Visit: Payer: Medicare Other | Admitting: Physical Therapy

## 2023-11-14 ENCOUNTER — Telehealth: Admitting: Physician Assistant

## 2023-11-14 DIAGNOSIS — N390 Urinary tract infection, site not specified: Secondary | ICD-10-CM | POA: Diagnosis not present

## 2023-11-14 DIAGNOSIS — G20A1 Parkinson's disease without dyskinesia, without mention of fluctuations: Secondary | ICD-10-CM | POA: Diagnosis not present

## 2023-11-14 DIAGNOSIS — R410 Disorientation, unspecified: Secondary | ICD-10-CM

## 2023-11-14 NOTE — Progress Notes (Signed)
   11/14/2023  Patient ID: Brittany Benton, female   DOB: Sep 06, 1955, 68 y.o.   MRN: [id number]  Patient Follow-Up Attempt  I called today to speak with Brittany Benton, but her husband answered the phone and stated it was not a good time to talk to the patient. He reported that she had fallen earlier this morning but said that everything is okay. When asked specifically about signs of confusion, bruising, or bleeding, he initially denied any such symptoms. He also denied that she hit her head during the fall and mentioned that EMS assisted him in helping her up.  However, he later expressed concern that Brittany Benton has been confused or paranoid over the past 2-3 days. He stated that she "does not feel safe" and believes that people in the house are "smoking crack cocaine" but there is no else in the house except the two of them. He also noted that she did not appear to recognize him yesterday. Additionally, she reportedly asked, "When are we going home?" despite being in the home where they have lived for several years.  Brittany Benton denied any complaints from the patient regarding burning or frequent urination. He was unsure if there has been any improvement in her condition. He has not checked her temperature but does have a thermometer available to do so.  Given the reported changes in behavior and mental status, I asked Brittany Benton if it would be okay to contact the clinic and speak with a triage nurse to determine if the patient could be seen today.  A 3:00 PM telehealth visit was scheduled for the patient to be seen at the clinic today after patient was triaged by the nurse.   Overall, Brittany Benton did state that he feels overwhelmed with changes related to Brittany Benton's health and the stress that it is causing while also trying to deal his battling stage IV cancer.   Medication related side effects for some medications (per chart meds):  - Recently added: --Doxycycline  may cause hallucinations, paranoia, anxiety  although rare. Per last discussion, patient's sister mentioned patient was having hallucinations while at the facility.  - Famotidine  may cause confusion, although rare, in elderly patients.  - Sinemet  may cause abnormal dreams, hallucinations, confusion - imipramine  50 mg is a dose that the patient has been taking for years. Although, it can cause confusion/delusions. These symptoms often begin within the first two weeks of starting the medication and usually go away on their own within 3 to 20 days. However, lowering the dose or stopping the medication often helps the confusion go away faster. - klonopin could cause some confusion.  - DDI: sertraline  and imipramine  may cause serotonin syndrome with concomitantly used but patient has been taking both medications for years  Dorcas Solian, PharmD Clinical Pharmacist Cell: [phone]

## 2023-11-14 NOTE — Telephone Encounter (Signed)
 FYI Only or Action Required?: Action required by provider: request for appointment.  Patient was last seen in primary care on 11/04/2023 by Gasper Nancyann BRAVO, MD.  Called Nurse Triage reporting Fall.  Symptoms began today.  Interventions attempted: Nothing.  Symptoms are: unchanged. Husband reports pt. Had a near fall this morning. She tripped going into bathroom with her walker, he lowered her to the floor on her buttocks. No injury. He has noticed increase in confusion, paranoia. Currently being treated for UTI.  Triage Disposition: See HCP Within 4 Hours (Or PCP Triage)  Patient/caregiver understands and will follow disposition?: Yes    Copied from CRM #[id number]. Topic: Clinical - Red Word Triage >> Nov 14, 2023  1:21 PM Fonda T wrote: [facility] that prompted transfer to Nurse Triage: Received call from spouse, [name], reports patient had a fall this morning, now experiencing symptoms with paranoia, confusion. Reason for Disposition  [1] MODERATE weakness (e.g., interferes with work, school, normal activities) AND [2] new-onset or getting worse  Answer Assessment - Initial Assessment Questions 1. MECHANISM: How did the fall happen?     Clemens going into bathroom with walker 2. DOMESTIC VIOLENCE AND ELDER ABUSE SCREENING: Did you fall because someone pushed you or tried to hurt you? If Yes, ask: Are you safe now?     no 3. ONSET: When did the fall happen? (e.g., minutes, hours, or days ago)     This morning 4. LOCATION: What part of the body hit the ground? (e.g., back, buttocks, head, hips, knees, hands, head, stomach)     Buttocks , slid down 5. INJURY: Did you hurt (injure) yourself when you fell? If Yes, ask: What did you injure? Tell me more about this? (e.g., body area; type of injury; pain severity)     No injury 6. PAIN: Is there any pain? If Yes, ask: How bad is the pain? (e.g., Scale 0-10; or none, mild,      no 7. SIZE: For cuts, bruises, or swelling, ask:  How large is it? (e.g., inches or centimeters)      none 8. PREGNANCY: Is there any chance you are pregnant? When was your last menstrual period?     no 9. OTHER SYMPTOMS: Do you have any other symptoms? (e.g., dizziness, fever, weakness; new-onset or worsening).      no 10. CAUSE: What do you think caused the fall (or falling)? (e.g., dizzy spell, tripped)       tripped  Protocols used: Falls and [facility]

## 2023-11-14 NOTE — Progress Notes (Signed)
 MyChart Video Visit  Virtual Visit via Video Note   This format is felt to be most appropriate for this patient at this time. Physical exam was limited by quality of the video and audio technology used for the visit.   Provider location: Virtual Visit Location Provider: Office/Clinic Patient Location: Home  I discussed the limitations of evaluation and management by telemedicine and the availability of in person appointments. The patient expressed understanding and agreed to proceed.  Patient: Brittany Benton   DOB: 03/05/1956   68 y.o. Female  MRN: [id number] Visit Date: 11/14/2023  Today's healthcare provider: Jolynn Spencer, PA-C   No chief complaint on file.  Subjective     Discussed the use of AI scribe software for clinical note transcription with the patient, who gave verbal consent to proceed.  History of Present Illness Brittany Benton is a 68 year old female with Parkinson's disease who presents with increased confusion and recent falls. She is accompanied by her husband, Brittany Benton.  Syniah has experienced increased confusion over the past two to three days, leading to a fall this morning. She sometimes does not recognize her husband and is unsure of her surroundings. Her husband is concerned about a possible connection to an unresolved urinary tract infection. She is currently on doxycycline  after a urine culture showed resistance to the initial antibiotic. She is halfway through the course. There is no dysuria, abdominal discomfort, or pain. Her husband notes inadequate water intake, which may contribute to her symptoms. No weakness, chest pain, shortness of breath, rapid heart rate, vision problems, headaches, tingling, or numbness.   Medications: Outpatient Medications Prior to Visit  Medication Sig   Ascorbic Acid (VITAMIN C PO) Take 1 tablet by mouth daily. (Patient not taking: Reported on 11/04/2023)   atorvastatin  (LIPITOR) 20 MG tablet TAKE 1 TABLET BY MOUTH EVERY  EVENING   Carbidopa -Levodopa  ER (SINEMET  CR) 25-100 MG tablet controlled release Take 1 tablet by mouth 2 (two) times daily.   Carbidopa -Levodopa  ER (SINEMET  CR) 25-100 MG tablet controlled release Take 2 tablets by mouth. (Patient not taking: Reported on 11/04/2023)   Cholecalciferol  (VITAMIN D -3) 1000 units CAPS Take 1 capsule by mouth daily.   doxycycline  (VIBRA -TABS) 100 MG tablet Take 1 tablet (100 mg total) by mouth 2 (two) times daily for 10 days.   famotidine  (PEPCID ) 20 MG tablet Take 1 tablet (20 mg total) by mouth every evening. For acid reflux   imipramine  (TOFRANIL ) 50 MG tablet TAKE ONE TABLET BY MOUTH TWICE DAILY   ketoconazole  (NIZORAL ) 2 % cream Apply 1 Application topically daily. Until rash resolves completely.   KLONOPIN  0.5 MG tablet Take 0.5 mg by mouth 2 (two) times daily.   levothyroxine  (SYNTHROID ) 88 MCG tablet Take 0.5 tablets (44 mcg total) by mouth daily.   midodrine  (PROAMATINE ) 5 MG tablet Take 1 tablet (5 mg total) by mouth 3 (three) times daily with meals. Hold if SBP > 120 mmHg and/or HR <65   Multiple Vitamin (MULTIVITAMIN WITH MINERALS) TABS tablet Take 1 tablet by mouth 2 (two) times daily. (Patient taking differently: Take 1 tablet by mouth daily. Taking one tablet once daily)   omeprazole  (PRILOSEC) 20 MG capsule TAKE 1 CAPSULE BY MOUTH ONCE DAILY   sertraline  (ZOLOFT ) 100 MG tablet TAKE ONE TABLET BY MOUTH DAILY   No facility-administered medications prior to visit.    Review of Systems All negative  Except see HPI        Objective    There  were no vitals taken for this visit.        Physical Exam  Constitutional:      General: She is not in acute distress.    Appearance: Normal appearance.  HENT:     Head: Normocephalic.  Pulmonary:     Effort: Pulmonary effort is normal. No respiratory distress.  Neurological:     Mental Status: She is alert and oriented to person, place, and time. Mental status is at baseline.       Assessment  & Plan Acute confusion in the setting of Parkinson's disease and urinary tract infection Acute confusion could be due to incomplete treatment of urinary tract infection or dehydration. No associated neurological deficits or pain. Dehydration considered due to low fluid intake.  Per spouse, patient confused in the same way as she was due to her recent appointment with Dr. Gasper on 11/04/2023.  Patient was referred to urology for recurrent UTI Patient was supposed to follow-up with neurology due to generalized weakness, cognitive decline and frequent falls. I do not see any records from neurology except on 07/2023.  He labs from 11/04/2023 showed low protein and albumin levels.  high-protein drinks was prescribed by Dr. Gasper - Encourage increased fluid intake - Advise to bring a urine sample to [facility] for analysis and culture. Pt is on multiple medications which could contribute to her confusion. Advised to follow-up with neurology - Instruct to seek ED if symptoms worsen. Unclear if pt's current behavior is normal for her  Urinary tract infection under treatment Urinary tract infection treated with doxycycline  due to resistance to initial antibiotic. Citrobacter identified in previous culture, resistant to certain antibiotics. Possible incomplete resolution contributing to confusion. - Continue doxycycline  until further evaluation. However, monitor for any signs of worsening her confusion. - Advise to bring a urine sample to [facility] for analysis and culture.  Parkinson's disease Long-standing Parkinson's disease with history of falls. No new symptoms of weakness or neurological changes reported. - Follow-up with Neurology within one to two weeks or sooner if symptoms worsen.   No follow-ups on file.     I discussed the assessment and treatment plan with the patient. The patient was provided an opportunity to ask questions and all were answered. The  patient agreed with the plan and demonstrated an understanding of the instructions.   The patient was advised to call back or seek an in-person evaluation if the symptoms worsen or if the condition fails to improve as anticipated.  I, Javeion Cannedy, PA-C have reviewed all documentation for this visit. The documentation on 11/14/2023  for the exam, diagnosis, procedures, and orders are all accurate and complete.  Jolynn Spencer, [facility], MMS [facility] [phone] (phone) [phone] (fax)  [facility]

## 2023-11-15 ENCOUNTER — Ambulatory Visit: Payer: Self-pay | Admitting: Physician Assistant

## 2023-11-15 DIAGNOSIS — N390 Urinary tract infection, site not specified: Secondary | ICD-10-CM | POA: Diagnosis not present

## 2023-11-15 DIAGNOSIS — R410 Disorientation, unspecified: Secondary | ICD-10-CM | POA: Diagnosis not present

## 2023-11-15 LAB — POCT URINALYSIS DIPSTICK
Bilirubin, UA: NEGATIVE
Blood, UA: NEGATIVE
Glucose, UA: NEGATIVE
Ketones, UA: NEGATIVE
Leukocytes, UA: NEGATIVE
Nitrite, UA: NEGATIVE
Protein, UA: POSITIVE — AB
Spec Grav, UA: 1.02 (ref 1.010–1.025)
Urobilinogen, UA: 0.2 U/dL
pH, UA: 5 (ref 5.0–8.0)

## 2023-11-16 ENCOUNTER — Encounter: Payer: Self-pay | Admitting: Emergency Medicine

## 2023-11-16 ENCOUNTER — Other Ambulatory Visit: Payer: Self-pay

## 2023-11-16 ENCOUNTER — Emergency Department (EMERGENCY_DEPARTMENT_HOSPITAL)
Admission: EM | Admit: 2023-11-16 | Discharge: 2023-11-26 | Disposition: A | Discharge status: Home / Self Care | Source: Home / Self Care | Attending: Emergency Medicine | Admitting: Emergency Medicine

## 2023-11-16 DIAGNOSIS — F419 Anxiety disorder, unspecified: Secondary | ICD-10-CM | POA: Insufficient documentation

## 2023-11-16 DIAGNOSIS — Z79899 Other long term (current) drug therapy: Secondary | ICD-10-CM | POA: Diagnosis not present

## 2023-11-16 DIAGNOSIS — R410 Disorientation, unspecified: Secondary | ICD-10-CM | POA: Diagnosis not present

## 2023-11-16 DIAGNOSIS — Z9049 Acquired absence of other specified parts of digestive tract: Secondary | ICD-10-CM | POA: Diagnosis not present

## 2023-11-16 DIAGNOSIS — S0990XA Unspecified injury of head, initial encounter: Secondary | ICD-10-CM | POA: Diagnosis not present

## 2023-11-16 DIAGNOSIS — F03918 Unspecified dementia, unspecified severity, with other behavioral disturbance: Secondary | ICD-10-CM

## 2023-11-16 DIAGNOSIS — Z91048 Other nonmedicinal substance allergy status: Secondary | ICD-10-CM | POA: Diagnosis not present

## 2023-11-16 DIAGNOSIS — I6782 Cerebral ischemia: Secondary | ICD-10-CM | POA: Diagnosis not present

## 2023-11-16 DIAGNOSIS — G20A1 Parkinson's disease without dyskinesia, without mention of fluctuations: Secondary | ICD-10-CM

## 2023-11-16 DIAGNOSIS — Z888 Allergy status to other drugs, medicaments and biological substances status: Secondary | ICD-10-CM | POA: Diagnosis not present

## 2023-11-16 DIAGNOSIS — N390 Urinary tract infection, site not specified: Secondary | ICD-10-CM

## 2023-11-16 DIAGNOSIS — Z743 Need for continuous supervision: Secondary | ICD-10-CM | POA: Diagnosis not present

## 2023-11-16 DIAGNOSIS — R450 Nervousness: Secondary | ICD-10-CM | POA: Diagnosis not present

## 2023-11-16 DIAGNOSIS — Z8249 Family history of ischemic heart disease and other diseases of the circulatory system: Secondary | ICD-10-CM | POA: Diagnosis not present

## 2023-11-16 DIAGNOSIS — Z9884 Bariatric surgery status: Secondary | ICD-10-CM | POA: Diagnosis not present

## 2023-11-16 DIAGNOSIS — Z8262 Family history of osteoporosis: Secondary | ICD-10-CM | POA: Diagnosis not present

## 2023-11-16 DIAGNOSIS — K219 Gastro-esophageal reflux disease without esophagitis: Secondary | ICD-10-CM | POA: Diagnosis not present

## 2023-11-16 DIAGNOSIS — F0390 Unspecified dementia without behavioral disturbance: Secondary | ICD-10-CM | POA: Insufficient documentation

## 2023-11-16 DIAGNOSIS — E785 Hyperlipidemia, unspecified: Secondary | ICD-10-CM | POA: Diagnosis not present

## 2023-11-16 DIAGNOSIS — Z85528 Personal history of other malignant neoplasm of kidney: Secondary | ICD-10-CM | POA: Diagnosis not present

## 2023-11-16 DIAGNOSIS — G9341 Metabolic encephalopathy: Secondary | ICD-10-CM | POA: Diagnosis not present

## 2023-11-16 DIAGNOSIS — E039 Hypothyroidism, unspecified: Secondary | ICD-10-CM | POA: Diagnosis not present

## 2023-11-16 DIAGNOSIS — R443 Hallucinations, unspecified: Secondary | ICD-10-CM | POA: Diagnosis not present

## 2023-11-16 DIAGNOSIS — Z885 Allergy status to narcotic agent status: Secondary | ICD-10-CM | POA: Diagnosis not present

## 2023-11-16 DIAGNOSIS — Z905 Acquired absence of kidney: Secondary | ICD-10-CM | POA: Diagnosis not present

## 2023-11-16 DIAGNOSIS — M47812 Spondylosis without myelopathy or radiculopathy, cervical region: Secondary | ICD-10-CM | POA: Insufficient documentation

## 2023-11-16 DIAGNOSIS — E876 Hypokalemia: Secondary | ICD-10-CM | POA: Diagnosis not present

## 2023-11-16 DIAGNOSIS — D320 Benign neoplasm of cerebral meninges: Secondary | ICD-10-CM | POA: Diagnosis not present

## 2023-11-16 DIAGNOSIS — I10 Essential (primary) hypertension: Secondary | ICD-10-CM | POA: Diagnosis not present

## 2023-11-16 DIAGNOSIS — R5381 Other malaise: Secondary | ICD-10-CM | POA: Diagnosis not present

## 2023-11-16 DIAGNOSIS — S199XXA Unspecified injury of neck, initial encounter: Secondary | ICD-10-CM | POA: Diagnosis not present

## 2023-11-16 DIAGNOSIS — R531 Weakness: Secondary | ICD-10-CM | POA: Diagnosis not present

## 2023-11-16 DIAGNOSIS — Z96641 Presence of right artificial hip joint: Secondary | ICD-10-CM | POA: Diagnosis not present

## 2023-11-16 LAB — COMPREHENSIVE METABOLIC PANEL WITH GFR
ALT: 5 U/L (ref 0–44)
AST: 29 U/L (ref 15–41)
Albumin: 3.2 g/dL — ABNORMAL LOW (ref 3.5–5.0)
Alkaline Phosphatase: 107 U/L (ref 38–126)
Anion gap: 10 (ref 5–15)
BUN: 14 mg/dL (ref 8–23)
CO2: 28 mmol/L (ref 22–32)
Calcium: 8.8 mg/dL — ABNORMAL LOW (ref 8.9–10.3)
Chloride: 103 mmol/L (ref 98–111)
Creatinine, Ser: 0.97 mg/dL (ref 0.44–1.00)
GFR, Estimated: >60 mL/min (ref >60)
Glucose, Bld: 95 mg/dL (ref 70–99)
Potassium: 2.8 mmol/L — ABNORMAL LOW (ref 3.5–5.1)
Sodium: 141 mmol/L (ref 135–145)
Total Bilirubin: 0.9 mg/dL (ref 0.0–1.2)
Total Protein: 6.5 g/dL (ref 6.5–8.1)

## 2023-11-16 LAB — ETHANOL: Alcohol, Ethyl (B): <15 mg/dL (ref <15)

## 2023-11-16 LAB — URINE DRUG SCREEN, QUALITATIVE (ARMC ONLY)
Amphetamines, Ur Screen: NOT DETECTED
Barbiturates, Ur Screen: NOT DETECTED
Benzodiazepine, Ur Scrn: POSITIVE — AB
Cannabinoid 50 Ng, Ur ~~LOC~~: NOT DETECTED
Cocaine Metabolite,Ur ~~LOC~~: NOT DETECTED
MDMA (Ecstasy)Ur Screen: NOT DETECTED
Methadone Scn, Ur: NOT DETECTED
Opiate, Ur Screen: NOT DETECTED
Phencyclidine (PCP) Ur S: NOT DETECTED
Tricyclic, Ur Screen: POSITIVE — AB

## 2023-11-16 LAB — CBC
HCT: 42.4 % (ref 36.0–46.0)
Hemoglobin: 13.7 g/dL (ref 12.0–15.0)
MCH: 27.5 pg (ref 26.0–34.0)
MCHC: 32.3 g/dL (ref 30.0–36.0)
MCV: 85.1 fL (ref 80.0–100.0)
Platelets: 242 K/uL (ref 150–400)
RBC: 4.98 MIL/uL (ref 3.87–5.11)
RDW: 14.1 % (ref 11.5–15.5)
WBC: 5.7 K/uL (ref 4.0–10.5)
nRBC: 0 % (ref 0.0–0.2)

## 2023-11-16 LAB — URINALYSIS, W/ REFLEX TO CULTURE (INFECTION SUSPECTED)
Bilirubin Urine: NEGATIVE
Glucose, UA: NEGATIVE mg/dL
Ketones, ur: 5 mg/dL — AB
Nitrite: NEGATIVE
Protein, ur: NEGATIVE mg/dL
Specific Gravity, Urine: 1.015 (ref 1.005–1.030)
pH: 6 (ref 5.0–8.0)

## 2023-11-16 MED ORDER — POTASSIUM CHLORIDE 20 MEQ PO PACK
40.0000 meq | PACK | Freq: Once | ORAL | Status: AC
  Administered 2023-11-16: 40 meq via ORAL
  Filled 2023-11-16: qty 2

## 2023-11-16 MED ORDER — PANTOPRAZOLE SODIUM 40 MG PO TBEC
40.0000 mg | DELAYED_RELEASE_TABLET | Freq: Every day | ORAL | Status: DC
  Administered 2023-11-16 – 2023-11-25 (×10): 40 mg via ORAL
  Filled 2023-11-16 (×9): qty 1

## 2023-11-16 MED ORDER — CLONAZEPAM 0.5 MG PO TABS
0.5000 mg | ORAL_TABLET | Freq: Two times a day (BID) | ORAL | Status: DC
  Administered 2023-11-16 – 2023-11-17 (×2): 0.5 mg via ORAL
  Filled 2023-11-16 (×2): qty 1

## 2023-11-16 MED ORDER — QUETIAPINE FUMARATE 25 MG PO TABS
12.5000 mg | ORAL_TABLET | Freq: Every day | ORAL | Status: DC
  Administered 2023-11-16: 12.5 mg via ORAL
  Filled 2023-11-16: qty 1

## 2023-11-16 MED ORDER — SERTRALINE HCL 50 MG PO TABS
100.0000 mg | ORAL_TABLET | Freq: Every day | ORAL | Status: DC
  Administered 2023-11-16 – 2023-11-25 (×10): 100 mg via ORAL
  Filled 2023-11-16 (×9): qty 2

## 2023-11-16 MED ORDER — CARBIDOPA-LEVODOPA ER 25-100 MG PO TBCR
2.0000 | EXTENDED_RELEASE_TABLET | Freq: Two times a day (BID) | ORAL | Status: DC
  Administered 2023-11-16 – 2023-11-25 (×17): 2 via ORAL
  Filled 2023-11-16 (×21): qty 2

## 2023-11-16 MED ORDER — LEVOTHYROXINE SODIUM 88 MCG PO TABS
44.0000 ug | ORAL_TABLET | Freq: Every day | ORAL | Status: DC
  Administered 2023-11-17 – 2023-11-25 (×9): 44 ug via ORAL
  Filled 2023-11-16: qty 1
  Filled 2023-11-16: qty 0.5
  Filled 2023-11-16 (×2): qty 1
  Filled 2023-11-16 (×2): qty 0.5
  Filled 2023-11-16: qty 1
  Filled 2023-11-16 (×7): qty 0.5

## 2023-11-16 MED ORDER — MIDODRINE HCL 5 MG PO TABS
5.0000 mg | ORAL_TABLET | Freq: Three times a day (TID) | ORAL | Status: DC
  Administered 2023-11-17 – 2023-11-25 (×25): 5 mg via ORAL
  Filled 2023-11-16 (×22): qty 1

## 2023-11-16 MED ORDER — IMIPRAMINE HCL 50 MG PO TABS
50.0000 mg | ORAL_TABLET | Freq: Two times a day (BID) | ORAL | Status: DC
  Administered 2023-11-16 – 2023-11-25 (×17): 50 mg via ORAL
  Filled 2023-11-16 (×20): qty 1

## 2023-11-16 NOTE — ED Notes (Signed)
 Pt given snack at this time. Pt asked to use phone at this time, informed pt that she would have to wait until phone hours per hospital policy.

## 2023-11-16 NOTE — ED Notes (Signed)
 Pt belongings inculde: Pink Top Black pants Black pair of shoes

## 2023-11-16 NOTE — ED Notes (Signed)
 Pt stated to this nt that she has not had soda or juice in 3 years. She would prefer water with her meals.

## 2023-11-16 NOTE — ED Notes (Signed)
 Patient denies SI at this time. Patient was offered the option to file a police report, patient declined at this time.
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
## 2023-11-16 NOTE — ED Notes (Signed)
 Pt belongings inculde:  Pink Top Black pants Black pair of shoes Glasses Purse of multi-colored 1 pair of scissors.

## 2023-11-16 NOTE — ED Notes (Signed)
Provided pt with warm blanket at this time.
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
## 2023-11-16 NOTE — ED Notes (Signed)
Patient is vol pending consult

## 2023-11-16 NOTE — ED Notes (Signed)
Gave pt a cup of water at this time.

## 2023-11-17 ENCOUNTER — Emergency Department

## 2023-11-17 DIAGNOSIS — F419 Anxiety disorder, unspecified: Secondary | ICD-10-CM

## 2023-11-17 DIAGNOSIS — R443 Hallucinations, unspecified: Secondary | ICD-10-CM

## 2023-11-17 LAB — URINE CULTURE

## 2023-11-17 LAB — POTASSIUM: Potassium: 3.2 mmol/L — ABNORMAL LOW (ref 3.5–5.1)

## 2023-11-17 LAB — MAGNESIUM: Magnesium: 1.7 mg/dL (ref 1.7–2.4)

## 2023-11-17 MED ORDER — QUETIAPINE FUMARATE 25 MG PO TABS
12.5000 mg | ORAL_TABLET | ORAL | Status: DC
  Administered 2023-11-17 – 2023-11-25 (×18): 12.5 mg via ORAL
  Filled 2023-11-17 (×16): qty 1

## 2023-11-17 MED ORDER — ACETAMINOPHEN 325 MG PO TABS
650.0000 mg | ORAL_TABLET | Freq: Four times a day (QID) | ORAL | Status: DC | PRN
  Administered 2023-11-17: 650 mg via ORAL
  Filled 2023-11-17: qty 2

## 2023-11-17 MED ORDER — QUETIAPINE FUMARATE 25 MG PO TABS
25.0000 mg | ORAL_TABLET | Freq: Every day | ORAL | Status: DC
  Administered 2023-11-18 – 2023-11-24 (×7): 25 mg via ORAL
  Filled 2023-11-17 (×7): qty 1

## 2023-11-17 MED ORDER — CLONAZEPAM 0.5 MG PO TABS
0.5000 mg | ORAL_TABLET | Freq: Two times a day (BID) | ORAL | Status: DC | PRN
  Administered 2023-11-18 – 2023-11-22 (×5): 0.5 mg via ORAL
  Filled 2023-11-17 (×5): qty 1

## 2023-11-17 MED ORDER — POTASSIUM CHLORIDE 20 MEQ PO PACK
40.0000 meq | PACK | Freq: Four times a day (QID) | ORAL | Status: AC
  Administered 2023-11-17: 40 meq via ORAL
  Filled 2023-11-17: qty 2

## 2023-11-17 MED ORDER — CEPHALEXIN 500 MG PO CAPS
500.0000 mg | ORAL_CAPSULE | Freq: Two times a day (BID) | ORAL | Status: DC
  Administered 2023-11-17 (×2): 500 mg via ORAL
  Filled 2023-11-17 (×3): qty 1

## 2023-11-17 NOTE — ED Notes (Signed)
 Pt Breakfast provided at bedside

## 2023-11-17 NOTE — ED Notes (Signed)
 Spoke with Husband [name] Edison at this time. Per husband pt's symptoms do often come and go but over the past 5 days pt has become more confused/paranoid. Updated Husband on POC at this time.

## 2023-11-17 NOTE — ED Notes (Signed)
 Lunch tray given to pt.
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
## 2023-11-17 NOTE — ED Notes (Signed)
 Assumed care of patient, pt resting in bed, no distress noted. Diaper changed d/t small amount of stool.

## 2023-11-17 NOTE — ED Notes (Signed)
 Dinner tray given to pt

## 2023-11-17 NOTE — ED Notes (Signed)
 Family visiting with pt at time.

## 2023-11-17 NOTE — ED Notes (Signed)
Patient is vol pending possible discharge 

## 2023-11-18 DIAGNOSIS — N390 Urinary tract infection, site not specified: Secondary | ICD-10-CM | POA: Diagnosis not present

## 2023-11-18 MED ORDER — CEPHALEXIN 500 MG PO CAPS
500.0000 mg | ORAL_CAPSULE | Freq: Two times a day (BID) | ORAL | Status: AC
  Administered 2023-11-18 – 2023-11-19 (×3): 500 mg via ORAL
  Filled 2023-11-18 (×3): qty 1

## 2023-11-18 NOTE — ED Notes (Signed)
 vol/consult done/awaiting psychiatric disposition.

## 2023-11-18 NOTE — ED Notes (Signed)
 Pt's husband at bedside for visit

## 2023-11-18 NOTE — ED Notes (Signed)
VOL/pending dispo

## 2023-11-18 NOTE — ED Provider Notes (Signed)
 Emergency Medicine Observation Re-evaluation Note  Brittany Benton is a 68 y.o. female, seen on rounds today.  Pt initially presented to the ED for complaints of Psychiatric Evaluation  Currently, the patient is is no acute distress. Denies any concerns at this time.  Physical Exam  Blood pressure 127/67, pulse 94, temperature 98.1 F (36.7 C), temperature source Oral, resp. rate 18, height 5' (1.524 m), weight 72.6 kg, SpO2 99%.  Physical Exam: General: No apparent distress Pulm: Normal WOB Neuro: Moving all extremities Psych: Resting comfortably     ED Course / MDM     I have reviewed the labs performed to date as well as medications administered while in observation.  Recent changes in the last 24 hours include: No acute events overnight.  Plan   Current plan: Patient awaiting psychiatric disposition.    Brittany Benton, Brittany SAILOR, DO 11/18/23 [phone]

## 2023-11-19 ENCOUNTER — Telehealth: Payer: Self-pay

## 2023-11-19 DIAGNOSIS — R410 Disorientation, unspecified: Secondary | ICD-10-CM | POA: Insufficient documentation

## 2023-11-19 DIAGNOSIS — N390 Urinary tract infection, site not specified: Secondary | ICD-10-CM | POA: Diagnosis not present

## 2023-11-19 LAB — URINE CULTURE: Culture: 70000 — AB

## 2023-11-19 MED ORDER — TRAZODONE HCL 50 MG PO TABS
50.0000 mg | ORAL_TABLET | Freq: Once | ORAL | Status: AC
  Administered 2023-11-19: 50 mg via ORAL
  Filled 2023-11-19: qty 1

## 2023-11-19 MED ORDER — CLONAZEPAM 0.5 MG PO TABS
0.5000 mg | ORAL_TABLET | Freq: Once | ORAL | Status: AC
  Administered 2023-11-20: 0.5 mg via ORAL
  Filled 2023-11-19: qty 1

## 2023-11-19 MED ORDER — TRAZODONE HCL 50 MG PO TABS: 50.0000 mg | ORAL_TABLET | Freq: Every day | ORAL | Status: DC

## 2023-11-19 MED ORDER — HALOPERIDOL LACTATE 5 MG/ML IJ SOLN
2.0000 mg | Freq: Once | INTRAMUSCULAR | Status: AC
  Administered 2023-11-19: 2 mg via INTRAMUSCULAR
  Filled 2023-11-19: qty 1

## 2023-11-19 NOTE — Telephone Encounter (Signed)
 Copied from CRM [id number]. Topic: Clinical - Medical Advice >> Nov 19, 2023  9:12 AM Antwanette L wrote: Reason for CRM: Patient husbandBrytani Benton) is calling to let Dr. Gasper know  that his wife is in the hospital. The pt is currently at [facility] due to anxiety. The hospital is doing psych evaluation and Brittany Benton just wants some advice.Brittany Benton is requesting a call back from Dr. Gasper. Please call Brittany Benton at [phone]
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
## 2023-11-19 NOTE — ED Notes (Signed)
 Pt's husband at bedside.

## 2023-11-19 NOTE — ED Provider Notes (Signed)
 Emergency Medicine Observation Re-evaluation Note  Brittany Benton is a 68 y.o. female, seen on rounds today.  Pt initially presented to the ED for complaints of Psychiatric Evaluation Currently, the patient is awake, eating [facility]-- of note, patient has a guardian and been adjudicated incompetent. Guardian must make medical decisions.   Physical Exam  BP (!) 111/42   Pulse 94   Temp 97.9 F (36.6 C) (Oral)   Resp 18   Ht 5' (1.524 m)   Wt 72.6 kg   SpO2 99%   BMI 31.25 kg/m  Physical Exam General: NAD Cardiac: RR Lungs: CTAB Psych: Tearful affect  ED Course / MDM  EKG:   I have reviewed the labs performed to date as well as medications administered while in observation.  Recent changes in the last 24 hours include Psych saw, pending final reccomendations  Plan  Current plan is for TOC placement.     Brittany Rolland FORBES, MD 11/19/23 [phone]

## 2023-11-19 NOTE — TOC CM/SW Note (Addendum)
..  Transition of Care [facility]) - Inpatient Brief Assessment   Patient Details  Name: DEVANEY SEGERS MRN: [id number] Date of Birth: [birth year]  [facility] CM/SW Contact:    Edsel DELENA Fischer, LCSW Phone Number: 11/19/2023, 10:27 AM   Clinical Narrative:  SW spoke with Lauraine with [facility].  Pt was currently receiving PT (HH) at facility.  Due to pt current psych issues, pt may not be appropriate for continued support.  However please follow up with facility before pt discharges to discuss with staff.  Contact Camie at [phone]  Transition of Care Asessment:

## 2023-11-19 NOTE — Consult Note (Signed)
 [facility] Psychiatric Consult Initial  Patient Name: .Brittany Benton  MRN: [id number]  DOB: 09-03-1955  Consult Order details:  Orders (From admission, onward)     Start     Ordered   11/16/23 1444  IP CONSULT TO PSYCHIATRY       Ordering Provider: Jossie Artist POUR, MD  Provider:  (Not yet assigned)  Question Answer Comment  Consult Timeframe ROUTINE - requires response within 24 hours   Reason for Consult? Consult for medication management   Contact phone number where the requesting provider can be reached [phone]      11/16/23 1443   11/16/23 1443  CONSULT TO CALL ACT TEAM       Ordering Provider: Jossie Artist POUR, MD  Provider:  (Not yet assigned)  Question:  Reason for Consult?  Answer:  Psych consult   11/16/23 1443             Mode of Visit: Tele-visit Virtual Statement:TELE PSYCHIATRY ATTESTATION & CONSENT As the provider for this telehealth consult, I attest that I verified the patient's identity using two separate identifiers, introduced myself to the patient, provided my credentials, disclosed my location, and performed this encounter via a HIPAA-compliant, real-time, face-to-face, two-way, interactive audio and video platform and with the full consent and agreement of the patient (or guardian as applicable.) Patient physical location: [facility]. Telehealth provider physical location: home office in state of [place] .   Video start time:   Video end time:      Psychiatry Consult Evaluation  Service Date: November 19, 2023 LOS:  LOS: 0 days  Chief Complaint   Primary Psychiatric Diagnoses  dementia  Assessment  [name] is a 68 y.o. female admitted: Presented to the EDfor 11/16/2023  1:56 PM for evaluation of cognitive and behavioral changes. She carries the psychiatric diagnoses of Major Neurocognitive Disorder (dementia) and has a past medical history of Parkinson's disease.  Her current presentation of scattered conversation, loose  associations, and circumstantial thought process is most consistent with dementia-related cognitive impairment. She meets criteria for outpatient follow-up and supportive management based on the absence of acute psychiatric risk and the presence of chronic neurocognitive decline.  Current outpatient psychotropic medications include sertraline  and Seroquel  reported for psychiatric symptoms, and historically she has had no notable psychiatric medication trials. She was compliant with her medical regimen for Parkinson's prior to admission as evidenced by husband's report. On initial examination, patient was pleasant, cooperative, tangential, and denied SI, HI, and AVH.   Diagnoses:  Active Hospital problems: Active Problems:   * No active hospital problems. *    Plan   ## Psychiatric Medication Recommendations:  Memantine 5mg  qd  ## Medical Decision Making Capacity: Patient has a guardian and has thus been adjudicated incompetent; please involve patients guardian in medical decision making  ## Disposition:-- Patient meet criteria for inpatient admission ## Behavioral / Environmental: - No specific recommendations at this time.     ## Safety and Observation Level:  - Based on my clinical evaluation, I estimate the patient to be at a risk of self harm in the current setting. - At this time, we recommend  1:1 Observation. This decision is based on my review of the chart including patient's history and current presentation, interview of the patient, mental status examination, and consideration of suicide risk including evaluating suicidal ideation, plan, intent, suicidal or self-harm behaviors, risk factors, and protective factors. This judgment is based on our ability to directly  address suicide risk, implement suicide prevention strategies, and develop a safety plan while the patient is in the clinical setting. Please contact our team if there is a concern that risk level has changed.  CSSR Risk  Category:C-SSRS RISK CATEGORY: No Risk  Suicide Risk Assessment: Patient has following modifiable risk factors for suicide: active mental illness (to encompass adhd, tbi, mania, psychosis, trauma reaction), which we are addressing by patient guardian education. Patient has following non-modifiable or demographic risk factors for suicide: psychiatric hospitalization Patient has the following protective factors against suicide: Supportive family  Thank you for this consult request. Recommendations have been communicated to the primary team.  We will recommend outpatient resources at this time.   Bridey Brookover, NP       History of Present Illness  Relevant Aspects of Hospital ED Course:  Admitted on 11/16/2023 for cognitive and behavioral changes.   Patient Report:  Brittany Benton is a 68 year old female with a past medical history of Parkinson's disease and dementia who presented to the ED for psychiatric evaluation. She currently lives with her husband. The patient denies suicidal ideation (SI), homicidal ideation (HI), and auditory or visual hallucinations (AVH).  Her conversation is scattered, and she exhibits loose associations and circumstantial thought processes throughout the assessment. She is able to engage in conversation but often drifts off-topic and requires redirection to answer questions. No acute safety concerns were verbalized.  Psych ROS:  Depression: yes Anxiety:  yes Mania (lifetime and current): no Psychosis: (lifetime and current): no  Review of Systems  HENT: Negative.    Eyes: Negative.   Respiratory: Negative.    Cardiovascular: Negative.   Gastrointestinal: Negative.   Genitourinary: Negative.   Musculoskeletal: Negative.   Skin: Negative.   Neurological: Negative.      Psychiatric and Social History  Psychiatric History:  Information collected from Patient chart hx  Prev Dx/Sx: dementia Current Psych Provider: unknown Home Meds (current):  sertraline , seroquel  Previous Med Trials: unknown Therapy: unknown  Prior Psych Hospitalization: yes  Prior Self Harm: no Prior Violence: no  Family Psych History: No pertinent family psych history Family Hx suicide: No known family history of suicide  Social History:  Developmental Hx: unknown Educational Hx: unknown Occupational Hx: unknown Legal Hx: unknown Living Situation: with husband Spiritual Hx: unknown Access to weapons/lethal means: unknown   Substance History Alcohol: denies  Tobacco: denies Illicit drugs: denies Prescription drug abuse: denies Rehab hx: denies  Exam Findings   Vital Signs:  Temp:  [98 F (36.7 C)] 98 F (36.7 C) (08/04 2151) Pulse Rate:  [91-100] 100 (08/04 2151) Resp:  [18] 18 (08/04 2151) BP: (113-139)/(64-87) 139/64 (08/04 2151) SpO2:  [100 %] 100 % (08/04 2151) Blood pressure 139/64, pulse 100, temperature 98 F (36.7 C), temperature source Oral, resp. rate 18, height 5' (1.524 m), weight 72.6 kg, SpO2 100%. Body mass index is 31.25 kg/m.  Physical Exam HENT:     Head: Normocephalic.     Nose: Nose normal.     Mouth/Throat:     Pharynx: Oropharynx is clear.  Eyes:     Extraocular Movements: Extraocular movements intact.  Pulmonary:     Effort: Pulmonary effort is normal.  Musculoskeletal:        General: Normal range of motion.     Cervical back: Normal range of motion.  Skin:    General: Skin is dry.  Neurological:     Mental Status: She is alert.      Other History  These have been pulled in through the EMR, reviewed, and updated if appropriate.  Family History:  The patient's family history includes Anxiety disorder in her mother; Breast cancer in her maternal aunt; Dementia in her mother; Heart attack in her father; Heart disease in her father; Obesity in her maternal uncle; Osteoporosis in her mother.  Medical History: Past Medical History:  Diagnosis Date  . Allergy 1989  . Anemia   . Anxiety   .  Arthritis    osteoarthritis -right hip  . Cancer (HCC) Kidney  (1994)  . Cataract   . Depression   . 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 GERD (gastroesophageal reflux disease)   . Hernia, incisional    after renal surgery  . History of chicken pox   . Hyperlipidemia   . Hypertension   . Parkinson disease (HCC)   . Renal cell carcinoma (HCC)    s/p right nephrectomy 1994  . Sleep apnea   . Thyroid  disease     Surgical History: Past Surgical History:  Procedure Laterality Date  . APPENDECTOMY  1994  . BIOPSY  11/07/2022   Procedure: BIOPSY;  Surgeon: Therisa Bi, MD;  Location[facility] ENDOSCOPY;  Service: Gastroenterology;;  . BREAST BIOPSY Right 1991   Negative  . CHOLECYSTECTOMY  1994  . COLONOSCOPY WITH PROPOFOL  N/A 11/07/2022   Procedure: COLONOSCOPY WITH PROPOFOL ;  Surgeon: Therisa Bi, MD;  Location: [facility] ENDOSCOPY;  Service: Gastroenterology;  Laterality: N/A;  . ESOPHAGOGASTRODUODENOSCOPY (EGD) WITH PROPOFOL  N/A 11/07/2022   Procedure: ESOPHAGOGASTRODUODENOSCOPY (EGD) WITH PROPOFOL ;  Surgeon: Therisa Bi, MD;  Location: [facility] ENDOSCOPY;  Service: Gastroenterology;  Laterality: N/A;  . JOINT REPLACEMENT  2019  . LAPAROSCOPIC GASTRIC SLEEVE RESECTION  02/02/2016   Dr Thedora at [facility]  . NEPHRECTOMY  1994   Renal Cell Carcinoma  . parotid gland removal  1990   also removed a tumor  . POLYPECTOMY  11/07/2022   Procedure: POLYPECTOMY INTESTINAL;  Surgeon: Therisa Bi, MD;  Location: [facility] ENDOSCOPY;  Service: Gastroenterology;;  . TONSILLECTOMY    . TOTAL HIP ARTHROPLASTY Right 08/08/2016   Procedure: TOTAL HIP ARTHROPLASTY;  Surgeon: Kayla Pinal, MD;  Location: [facility] ORS;  Service: Orthopedics;  Laterality: Right;  . TUBAL LIGATION  1979     Medications:   Current Facility-Administered Medications:  .  acetaminophen  (TYLENOL ) tablet 650 mg, 650 mg, Oral, Q6H PRN, Mumma, Shannon, MD, 650 mg at 11/17/23 0931 .  Carbidopa -Levodopa  ER (SINEMET  CR) 25-100  MG tablet controlled release 2 tablet, 2 tablet, Oral, BID, [name], MD, 2 tablet at 11/18/23 2252 .  cephALEXin  (KEFLEX ) capsule 500 mg, 500 mg, Oral, Q12H, Claudene Rover, MD, 500 mg at 11/18/23 2250 .  clonazePAM  (KLONOPIN ) tablet 0.5 mg, 0.5 mg, Oral, BID PRN, Hampton, Tracie B, NP, 0.5 mg at 11/18/23 2251 .  imipramine  (TOFRANIL ) tablet 50 mg, 50 mg, Oral, BID, [name], MD, 50 mg at 11/18/23 2252 .  levothyroxine  (SYNTHROID ) tablet 44 mcg, 44 mcg, Oral, Q0600, [name], MD, 44 mcg at 11/17/23 9387 .  midodrine  (PROAMATINE ) tablet 5 mg, 5 mg, Oral, TID WC, [name], MD, 5 mg at 11/17/23 1634 .  pantoprazole  (PROTONIX ) EC tablet 40 mg, 40 mg, Oral, Daily, [name], MD, 40 mg at 11/17/23 0931 .  QUEtiapine  (SEROQUEL ) tablet 12.5 mg, 12.5 mg, Oral, [id number], Hampton, Tracie B, NP, 12.5 mg at 11/18/23 1407 .  QUEtiapine  (SEROQUEL ) tablet 25 mg, 25 mg, Oral, QHS, Hampton, Tracie B, NP, 25 mg at 11/18/23 2251 .  sertraline  (ZOLOFT ) tablet 100 mg, 100 mg, Oral, Daily, [name], MD, 100 mg at 11/17/23 9068  Current Outpatient Medications:  .  Ascorbic Acid (VITAMIN C PO), Take 1 tablet by mouth daily., Disp: , Rfl:  .  atorvastatin  (LIPITOR) 20 MG tablet, TAKE 1 TABLET BY MOUTH EVERY EVENING, Disp: 90 tablet, Rfl: 3 .  Carbidopa -Levodopa  ER (SINEMET  CR) 25-100 MG tablet controlled release, Take 1 tablet by mouth 2 (two) times daily., Disp: , Rfl:  .  Carbidopa -Levodopa  ER (SINEMET  CR) 25-100 MG tablet controlled release, Take 2 tablets by mouth., Disp: , Rfl:  .  Cholecalciferol  (VITAMIN D -3) 1000 units CAPS, Take 1 capsule by mouth daily., Disp: , Rfl:  .  famotidine  (PEPCID ) 20 MG tablet, Take 1 tablet (20 mg total) by mouth every evening. For acid reflux, Disp: 90 tablet, Rfl: 1 .  imipramine  (TOFRANIL ) 50 MG tablet, TAKE ONE TABLET BY MOUTH TWICE DAILY, Disp: 180 tablet, Rfl: 3 .  ketoconazole  (NIZORAL ) 2 % cream, Apply 1 Application topically  daily. Until rash resolves completely., Disp: 60 g, Rfl: 3 .  KLONOPIN  0.5 MG tablet, Take 0.5 mg by mouth 2 (two) times daily., Disp: , Rfl:  .  levothyroxine  (SYNTHROID ) 88 MCG tablet, Take 0.5 tablets (44 mcg total) by mouth daily., Disp: 45 tablet, Rfl: 1 .  midodrine  (PROAMATINE ) 5 MG tablet, Take 1 tablet (5 mg total) by mouth 3 (three) times daily with meals. Hold if SBP > 120 mmHg and/or HR <65, Disp: , Rfl:  .  Multiple Vitamin (MULTIVITAMIN WITH MINERALS) TABS tablet, Take 1 tablet by mouth 2 (two) times daily. (Patient taking differently: Take 1 tablet by mouth daily. Taking one tablet once daily), Disp: , Rfl:  .  omeprazole  (PRILOSEC) 20 MG capsule, TAKE 1 CAPSULE BY MOUTH ONCE DAILY, Disp: 90 capsule, Rfl: 0 .  sertraline  (ZOLOFT ) 100 MG tablet, TAKE ONE TABLET BY MOUTH DAILY, Disp: 90 tablet, Rfl: 0  Allergies: Allergies  Allergen Reactions  . Codeine Nausea And Vomiting and Other (See Comments)  . Morphine Hives, Swelling and Other (See Comments)  . Tape Other (See Comments)    blisters blisters  . Tramadol  Other (See Comments)    Hallucinations    Vinnie Bobst, NP

## 2023-11-19 NOTE — Consult Note (Signed)
 [facility] Psychiatric Consult Follow-up  Patient Name: .TREAZURE NERY  MRN: [id number]  DOB: [birth year]  Consult Order details:  Orders (From admission, onward)     Start     Ordered   11/16/23 1444  IP CONSULT TO PSYCHIATRY       Ordering Provider: Jossie Artist POUR, MD  Provider:  (Not yet assigned)  Question Answer Comment  Consult Timeframe ROUTINE - requires response within 24 hours   Reason for Consult? Consult for medication management   Contact phone number where the requesting provider can be reached [phone]      11/16/23 1443   11/16/23 1443  CONSULT TO CALL ACT TEAM       Ordering Provider: Jossie Artist POUR, MD  Provider:  (Not yet assigned)  Question:  Reason for Consult?  Answer:  Psych consult   11/16/23 1443             Mode of Visit: In person    Psychiatry Consult Evaluation  Service Date: November 19, 2023 LOS:  LOS: 0 days  Chief Complaint hallucinations  Primary Psychiatric Diagnoses  Parkinson's Disease on sinemet  Anxiety Hallucinations   Assessment  TRENISE TURAY is a 68 y.o. female admitted: Presented to ED  11/19/23: Patient was seen this morning for psychiatric reassessment. She is alert and oriented x 4, calm, cooperative, with evaluation. Patient was observed with clear, linear thoughts, and able to make needs known. Patient has no acute mental health symptoms and denies SI/HI/AVH. Her husband was present at bedside for a portion of the assessment and reports planning for patient to return home. Patient is psychiatrically stable at baseline and ready for discharge when medically cleared.    Diagnoses:  Active Hospital problems: Delirium due to unknown medical condition   Plan   ## Psychiatric Medication Recommendations:  Seroquel  25 mg at bedtime and 12.5 mg BID  ## Medical Decision Making Capacity: Patient's mental status has improved and she is able to discuss treatment options. ## Further Work-up:  Recheck UA, replace potassium and  recheck, CT of head   ## Disposition:-- No psychiatric contraindication to discharge to home with her husband.     ## Safety and Observation Level:  - Based on my clinical evaluation, I estimate the patient to be at low risk of self harm in the current setting. - At this time, we recommend  routine. This decision is based on my review of the chart including patient's history and current presentation, interview of the patient, mental status examination, and consideration of suicide risk including evaluating suicidal ideation, plan, intent, suicidal or self-harm behaviors, risk factors, and protective factors. This judgment is based on our ability to directly address suicide risk, implement suicide prevention strategies, and develop a safety plan while the patient is in the clinical setting. Please contact our team if there is a concern that risk level has changed.  CSSR Risk Category:C-SSRS RISK CATEGORY: No Risk  Suicide Risk Assessment: Patient has following modifiable risk factors for suicide:none, which we are addressing by family inclusion. Patient has following non-modifiable or demographic risk factors for suicide: none Patient has the following protective factors against suicide: Supportive family  Thank you for this consult request. Recommendations have been communicated to the primary team.  Patient is cleared by psychiatry at this time.   Camelia JINNY Mountain, NP       History of Present Illness  Relevant Aspects of Hospital ED   Patient Report:  11/19/23: Patient was seen  for psychiatric re-evaluation this morning. Patient endorses occasional sadness secondary to losing a young family member in a MVA, but otherwise, she is mentally functioning well. Patient has no complaints regarding her medications. She has no significant issues today, and appeared to have a bright, pleasant affect. Specifically, when her husband arrived. She was able to appropriately answer all interview  questions. She relates eating and sleeping well. She has no observation of impaired cognitive functioning today. Patient reports she is taking antibiotics for a UTI prescribed by the medical team. Patient will be cleared by psychiatry today.    Psychiatric and Social History  Psychiatric History:  Information collected from patient, sister  Prev Dx/Sx: Parkinson's  Current Psych Provider: none Home Meds (current): none Previous Med Trials: none Therapy: none  Prior Psych Hospitalization: Denies  Prior Self Harm: denies Prior Violence: denies  Family Psych History: denies Family Hx suicide: denies  Social History:   Educational Hx: HS Occupational Hx: retired Armed forces operational officer Hx: denies Living Situation: spouse Spiritual Hx: unknown Access to weapons/lethal means: denies   Substance History Alcohol: denies  Type of alcohol denies  Last Drink denies  Number of drinks per day denies  History of alcohol withdrawal seizures denies  History of DT's denies  Tobacco: denies  Illicit drugs: denies  Prescription drug abuse: denies  Rehab hx: denies   Exam Findings  Physical Exam: I have reviewed and agree with initial provider exam Vital Signs:  Temp:  [97.6 F (36.4 C)-98 F (36.7 C)] 97.9 F (36.6 C) (08/05 1500) Pulse Rate:  [94-100] 94 (08/05 1500) Resp:  [17-18] 18 (08/05 1500) BP: (111-139)/(42-64) 111/42 (08/05 1500) SpO2:  [99 %-100 %] 99 % (08/05 1500) Blood pressure (!) 111/42, pulse 94, temperature 97.9 F (36.6 C), temperature source Oral, resp. rate 18, height 5' (1.524 m), weight 72.6 kg, SpO2 99%. Body mass index is 31.25 kg/m.    Mental Status Exam: General Appearance: Neat, clean, stated-age  Orientation:  person, place, time, and situation  Memory:  Limited due to cognition  Concentration:  Concentration: Good and Attention Span: Good  Recall:  Good  Attention  Good  Eye Contact:  Good  Speech:  Normal Rate  Language:  Good  Volume:  Normal  Mood:   Euthymic  Affect:  Appropriate  Thought Process:  Coherent  Thought Content:  Clear, linear, logical  Suicidal Thoughts:  No  Homicidal Thoughts:  No  Judgement:  Limited due to cognition  Insight:  Limited due to cognition  Psychomotor Activity:  Normal  Akathisia:  No  Fund of Knowledge:  Fair      Assets:  Housing  Cognition:  Impaired,  Moderate  ADL's:  Impaired  AIMS (if indicated):        Other History   These have been pulled in through the EMR, reviewed, and updated if appropriate.  Family History:  The patient's family history includes Anxiety disorder in her mother; Breast cancer in her maternal aunt; Dementia in her mother; Heart attack in her father; Heart disease in her father; Obesity in her maternal uncle; Osteoporosis in her mother.  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   GERD (gastroesophageal reflux disease)    Hernia, incisional    after  renal surgery   History of chicken pox    Hyperlipidemia    Hypertension    Parkinson disease (HCC)    Renal cell carcinoma (HCC)    s/p right nephrectomy 1994   Sleep apnea    Thyroid  disease     Surgical History: Past Surgical History:  Procedure Laterality Date   APPENDECTOMY  1994   BIOPSY  11/07/2022   Procedure: BIOPSY;  Surgeon: Therisa Bi, MD;  Location: [facility] ENDOSCOPY;  Service: Gastroenterology;;   BREAST BIOPSY Right 1991   Negative   CHOLECYSTECTOMY  1994   COLONOSCOPY WITH PROPOFOL  N/A 11/07/2022   Procedure: COLONOSCOPY WITH PROPOFOL ;  Surgeon: Therisa Bi, MD;  Location: [facility] ENDOSCOPY;  Service: Gastroenterology;  Laterality: N/A;   ESOPHAGOGASTRODUODENOSCOPY (EGD) WITH PROPOFOL  N/A 11/07/2022   Procedure: ESOPHAGOGASTRODUODENOSCOPY (EGD) WITH PROPOFOL ;  Surgeon: Therisa Bi, MD;   Location: [facility] ENDOSCOPY;  Service: Gastroenterology;  Laterality: N/A;   JOINT REPLACEMENT  2019   LAPAROSCOPIC GASTRIC SLEEVE RESECTION  02/02/2016   Dr Thedora at [facility]   NEPHRECTOMY  1994   Renal Cell Carcinoma   parotid gland removal  1990   also removed a tumor   POLYPECTOMY  11/07/2022   Procedure: POLYPECTOMY INTESTINAL;  Surgeon: Therisa Bi, MD;  Location: [facility] ENDOSCOPY;  Service: Gastroenterology;;   TONSILLECTOMY     TOTAL HIP ARTHROPLASTY Right 08/08/2016   Procedure: TOTAL HIP ARTHROPLASTY;  Surgeon: Kayla Pinal, MD;  Location: [facility] ORS;  Service: Orthopedics;  Laterality: Right;   TUBAL LIGATION  1979     Medications:   Current Facility-Administered Medications:    acetaminophen  (TYLENOL ) tablet 650 mg, 650 mg, Oral, Q6H PRN, Mumma, Shannon, MD, 650 mg at 11/17/23 0931   Carbidopa -Levodopa  ER (SINEMET  CR) 25-100 MG tablet controlled release 2 tablet, 2 tablet, Oral, BID, [name], MD, 2 tablet at 11/19/23 9049   clonazePAM  (KLONOPIN ) tablet 0.5 mg, 0.5 mg, Oral, BID PRN, Enola, Tracie B, NP, 0.5 mg at 11/18/23 2251   imipramine  (TOFRANIL ) tablet 50 mg, 50 mg, Oral, BID, [name], MD, 50 mg at 11/19/23 0950   levothyroxine  (SYNTHROID ) tablet 44 mcg, 44 mcg, Oral, Q0600, [name], MD, 44 mcg at 11/19/23 0446   midodrine  (PROAMATINE ) tablet 5 mg, 5 mg, Oral, TID WC, [name], MD, 5 mg at 11/19/23 1259   pantoprazole  (PROTONIX ) EC tablet 40 mg, 40 mg, Oral, Daily, [name], MD, 40 mg at 11/19/23 9050   QUEtiapine  (SEROQUEL ) tablet 12.5 mg, 12.5 mg, Oral, [id number], Hampton, Tracie B, NP, 12.5 mg at 11/19/23 9050   QUEtiapine  (SEROQUEL ) tablet 25 mg, 25 mg, Oral, QHS, Hampton, Tracie B, NP, 25 mg at 11/18/23 2251   sertraline  (ZOLOFT ) tablet 100 mg, 100 mg, Oral, Daily, [name], MD, 100 mg at 11/19/23 9050  Current Outpatient Medications:    Ascorbic Acid (VITAMIN C PO), Take 1 tablet by mouth daily., Disp: , Rfl:     atorvastatin  (LIPITOR) 20 MG tablet, TAKE 1 TABLET BY MOUTH EVERY EVENING, Disp: 90 tablet, Rfl: 3   Carbidopa -Levodopa  ER (SINEMET  CR) 25-100 MG tablet controlled release, Take 1 tablet by mouth 2 (two) times daily., Disp: , Rfl:    Carbidopa -Levodopa  ER (SINEMET  CR) 25-100 MG tablet controlled release, Take 2 tablets by mouth., Disp: , Rfl:    Cholecalciferol  (VITAMIN D -3) 1000 units CAPS, Take 1 capsule by mouth daily., Disp: , Rfl:    famotidine  (PEPCID ) 20 MG tablet, Take 1 tablet (20 mg total) by mouth every evening. For acid reflux,  Disp: 90 tablet, Rfl: 1   imipramine  (TOFRANIL ) 50 MG tablet, TAKE ONE TABLET BY MOUTH TWICE DAILY, Disp: 180 tablet, Rfl: 3   ketoconazole  (NIZORAL ) 2 % cream, Apply 1 Application topically daily. Until rash resolves completely., Disp: 60 g, Rfl: 3   KLONOPIN  0.5 MG tablet, Take 0.5 mg by mouth 2 (two) times daily., Disp: , Rfl:    levothyroxine  (SYNTHROID ) 88 MCG tablet, Take 0.5 tablets (44 mcg total) by mouth daily., Disp: 45 tablet, Rfl: 1   midodrine  (PROAMATINE ) 5 MG tablet, Take 1 tablet (5 mg total) by mouth 3 (three) times daily with meals. Hold if SBP > 120 mmHg and/or HR <65, Disp: , Rfl:    Multiple Vitamin (MULTIVITAMIN WITH MINERALS) TABS tablet, Take 1 tablet by mouth 2 (two) times daily. (Patient taking differently: Take 1 tablet by mouth daily. Taking one tablet once daily), Disp: , Rfl:    omeprazole  (PRILOSEC) 20 MG capsule, TAKE 1 CAPSULE BY MOUTH ONCE DAILY, Disp: 90 capsule, Rfl: 0   sertraline  (ZOLOFT ) 100 MG tablet, TAKE ONE TABLET BY MOUTH DAILY, Disp: 90 tablet, Rfl: 0  Allergies: Allergies  Allergen Reactions   Codeine Nausea And Vomiting and Other (See Comments)   Morphine Hives, Swelling and Other (See Comments)   Tape Other (See Comments)    blisters blisters   Tramadol  Other (See Comments)    Hallucinations    Laporscha Linehan JINNY Mountain, NP

## 2023-11-19 NOTE — ED Notes (Addendum)
 Answered call [place]. Pt remains confused - speaking about babies and her husband not visiting. Redirected and VS checked. Pt remains pleasant.

## 2023-11-19 NOTE — ED Notes (Signed)
 Pt lunch provided at bedside

## 2023-11-19 NOTE — ED Notes (Signed)
 Pt cleansed of urine and stool incontinence. Pts hair brushed.

## 2023-11-19 NOTE — ED Notes (Signed)
 Pt Breakfast provided at bedside

## 2023-11-20 MED ORDER — NITROFURANTOIN MONOHYD MACRO 100 MG PO CAPS
100.0000 mg | ORAL_CAPSULE | Freq: Two times a day (BID) | ORAL | Status: AC
  Administered 2023-11-20 – 2023-11-25 (×11): 100 mg via ORAL
  Filled 2023-11-20 (×10): qty 1

## 2023-11-20 MED ORDER — MIDODRINE HCL 5 MG PO TABS
5.0000 mg | ORAL_TABLET | Freq: Once | ORAL | Status: AC
  Administered 2023-11-20: 5 mg via ORAL
  Filled 2023-11-20: qty 1

## 2023-11-20 MED ORDER — CEPHALEXIN 500 MG PO CAPS: 500.0000 mg | ORAL_CAPSULE | Freq: Two times a day (BID) | ORAL | 0 refills | Status: AC

## 2023-11-20 NOTE — ED Notes (Signed)
 Pt yelling saying snakes are crawling on her bed, reassured pt that the snakes are not real. Pt anxious at this time. Offered pt PRN medications and pt agrees and would like them. Pt readjusted in bed at this time.

## 2023-11-20 NOTE — ED Notes (Signed)
 Pt repositioned in bed and this RN set up dinner tray at this time.

## 2023-11-20 NOTE — ED Notes (Signed)
 PT/OT at bedside at this time.

## 2023-11-20 NOTE — Evaluation (Signed)
 Physical Therapy Evaluation Patient Details Name: Brittany Benton MRN: [id number] DOB: 05-Sep-1955 Today's Date: 11/20/2023  History of Present Illness  Pt is a 68 year old female admitted with frequent falls with possible UTI as well as  degenerative changes related to Parkinson's disease, orthostatic hypotension     PMH significant for arkinson's disease , HTN and hypothyroidism, prior UTIs  Clinical Impression  Pt is 68 y/o female currently admitted to the ED 4 days ago with possible UTI and increased falls. After PT evaluation, impairments include decreased balance, decreased strength, and decreased functional mobility. Pt performed bed mobility with mod assist requiring consistent min assistance to maintain seated balance. Pt performed sit to stand with +2 max assist and RW. Pt able to take one step in forward and backward direction with +2 max assist and RW, however further distance not performed d/t BLE buckling and safety concerns. Pt is not at baseline level. Pt will benefit from skilled physical therapy to decrease her risk of falls and decrease care giver burden. At this time, pt is unable to safely discharge home; PT recommends continued physical therapy as discussed with care team.        If plan is discharge home, recommend the following: A lot of help with walking and/or transfers;Two people to help with walking and/or transfers;A lot of help with bathing/dressing/bathroom;Assistance with cooking/housework;Assist for transportation;Help with stairs or ramp for entrance   Can travel by private vehicle        Equipment Recommendations Rolling walker (2 wheels);BSC/3in1  Recommendations for Other Services       Functional Status Assessment Patient has had a recent decline in their functional status and demonstrates the ability to make significant improvements in function in a reasonable and predictable amount of time.     Precautions / Restrictions Precautions Precautions: None       Mobility  Bed Mobility Overal bed mobility: Needs Assistance Bed Mobility: Supine to Sit, Sit to Supine     Supine to sit: Mod assist Sit to supine: Max assist   General bed mobility comments: Pt required assistance with moving LEs on and off the bed from sit<>supine. She reports using the bed rail at home.    Transfers Overall transfer level: Needs assistance Equipment used: Rolling walker (2 wheels) Transfers: Sit to/from Stand Sit to Stand: +2 physical assistance, Max assist           General transfer comment: PT and SPT provided blocking at her feet and blocking the RW to prevent it from moving too far away.    Ambulation/Gait Ambulation/Gait assistance: Max assist Gait Distance (Feet):  (Pt able to take 1 step, but ambulation was d/c due to safety concern.) Assistive device: Rolling walker (2 wheels)            Stairs            Wheelchair Mobility     Tilt Bed    Modified Rankin (Stroke Patients Only)       Balance Overall balance assessment: Needs assistance Sitting-balance support: Feet unsupported (PT provided support at posterior shoulder d/t retropulsion in sitting) Sitting balance-Leahy Scale: Poor   Postural control: Posterior lean Standing balance support: Reliant on assistive device for balance (PT used RW) Standing balance-Leahy Scale: Poor Standing balance comment: Pt able to maintain balance with use of RW, however PT provided mod physical support via gait belt d/t posterior lean  Pertinent Vitals/Pain Pain Assessment Pain Assessment: No/denies pain    Home Living Family/patient expects to be discharged to:: Skilled nursing facility Living Arrangements: Spouse/significant other Available Help at Discharge: Family;Personal care attendant (Personal care 2x/week. Helps with ADLs) Type of Home: House Home Access: Ramped entrance Entrance Stairs-Rails: Right Entrance Stairs-Number of  Steps: 4 Alternate Level Stairs-Number of Steps: split level home -- after entering house, 4 steps to get to main level Home Layout: Two level Home Equipment: Rollator (4 wheels);Rolling Walker (2 wheels);BSC/3in1;Tub bench;Grab bars - toilet (grab bar at bed side)      Prior Function Prior Level of Function : Needs assist;History of Falls (last six months)             Mobility Comments: uses RW in home, manual WC in community ADLs Comments: Pt reports she is able to transfer, dress, toilet, bathe with Mod I; husband assists with all IADL     Extremity/Trunk Assessment   Upper Extremity Assessment Upper Extremity Assessment: Generalized weakness    Lower Extremity Assessment Lower Extremity Assessment: Generalized weakness       Communication   Communication Communication: No apparent difficulties    Cognition Arousal: Alert Behavior During Therapy: Anxious   PT - Cognitive impairments: No apparent impairments                         Following commands: Intact       Cueing Cueing Techniques: Verbal cues, Tactile cues     General Comments      Exercises Other Exercises Other Exercises: Pt educated about benefit of PT and DC recs   Assessment/Plan    PT Assessment Patient needs continued PT services  PT Problem List Decreased strength;Decreased activity tolerance;Decreased balance;Decreased mobility       PT Treatment Interventions Gait training;Stair training;Functional mobility training;Therapeutic activities;Therapeutic exercise;Balance training;Neuromuscular re-education;Patient/family education    PT Goals (Current goals can be found in the Care Plan section)  Acute Rehab PT Goals Patient Stated Goal: to move better PT Goal Formulation: With patient Time For Goal Achievement: 12/04/23 Potential to Achieve Goals: Fair    Frequency Min 2X/week     Co-evaluation               AM-PAC PT 6 Clicks Mobility  Outcome Measure  Help needed turning from your back to your side while in a flat bed without using bedrails?: A Lot Help needed moving from lying on your back to sitting on the side of a flat bed without using bedrails?: A Lot Help needed moving to and from a bed to a chair (including a wheelchair)?: Total Help needed standing up from a chair using your arms (e.g., wheelchair or bedside chair)?: A Lot Help needed to walk in hospital room?: Total Help needed climbing 3-5 steps with a railing? : Total 6 Click Score: 9    End of Session Equipment Utilized During Treatment: Gait belt Activity Tolerance: Patient tolerated treatment well Patient left: in bed;with family/visitor present;Other (comment) (with social worker present)   PT Visit Diagnosis: Unsteadiness on feet (R26.81);Repeated falls (R29.6);Muscle weakness (generalized) (M62.81);History of falling (Z91.81);Difficulty in walking, not elsewhere classified (R26.2)    Time: 8572-8497 PT Time Calculation (min) (ACUTE ONLY): 35 min   Charges:                Vianney Kopecky, SPT  Elira Colasanti 11/20/2023, 3:50 PM

## 2023-11-20 NOTE — ED Notes (Signed)
 Pt BP 80/63, notified Dr. Levander, pt states Midodrine  and MAR states a one time dose can be ordered for acute hypotension. See new orders.

## 2023-11-20 NOTE — Evaluation (Signed)
 Occupational Therapy Evaluation Patient Details Name: Brittany Benton MRN: [id number] DOB: [birth year] Today's Date: 11/20/2023   History of Present Illness   Pt is a 68 year old female admitted with frequent falls with possible UTI as well as  degenerative changes related to Parkinson's disease, orthostatic hypotension     PMH significant for arkinson's disease , HTN and hypothyroidism, prior UTIs     Clinical Impressions Brittany Benton presents with generalized weakness, reduced endurance, imbalance, debility, and confusion. Prior to admission, she has been living with her husband in a split level home, ramped entrance to the side door, then 4 steps to get to main level of home. Pt ambulates with a RW in the home, is generally IND with BADL, requires assistance with all IADL. She has a home health aide at home 9AM-1PM 3 days/wk, and is scheduled to begin HHPT. She reports she has had falls at home but is unable to state how many. She denies pain this day. Pt is aware she is in a hospital but is otherwise disoriented. Asked why she is at the hospital, she reports a story about being at a Halloween party and becoming confused because everyone is wearing a costume. She cannot state what month or season it is, but, with time, she is able to correctly state the year. Pt requires Min A for donning socks during today's session but reports that she is normally able to perform this task INDly. She requires increased time and Max A to come into sitting position from ER bed and Max A + 2 to transfer from sitting to standing. Pt is unsteady in standing. She initially requires Max A +2 to maintain standing balance, then eventually is able to stand with Max A +1 support but continues to be unsteady. Pt is debilitated following 5 days in hospital with little OOB mobility; pt's balance will likely improve rapidly as she begins to return to her normal activity level and has her RW available, but at present she is at a high falls  risk and demonstrates poor endurance and strength. Recommend ongoing OT while pt is hospitalized. To discharge home safely, she will require PRN home-based rehab services and +2 assistance to get into the main level of her home following hospital discharge. Rehab services <3 hrs/day would also be an option, although pt and spouse state they prefer to DC to home.     If plan is discharge home, recommend the following:   A lot of help with walking and/or transfers;A lot of help with bathing/dressing/bathroom;Assistance with cooking/housework;Direct supervision/assist for medications management;Assist for transportation;Supervision due to cognitive status;Direct supervision/assist for financial management;Help with stairs or ramp for entrance (would require medical transportation home, to assist pt in getting into house and to main living level)     Functional Status Assessment   Patient has had a recent decline in their functional status and demonstrates the ability to make significant improvements in function in a reasonable and predictable amount of time.     Equipment Recommendations   None recommended by OT     Recommendations for Other Services         Precautions/Restrictions   Precautions Precautions: Fall Restrictions Weight Bearing Restrictions Per Provider Order: No     Mobility Bed Mobility Overal bed mobility: Needs Assistance Bed Mobility: Supine to Sit, Sit to Supine     Supine to sit: Max assist Sit to supine: Mod assist     Patient Response: Cooperative  Transfers Overall transfer level:  Needs assistance   Transfers: Sit to/from Stand Sit to Stand: Max assist +2                  Balance Overall balance assessment: Needs assistance Sitting-balance support: Feet unsupported Sitting balance-Leahy Scale: Poor   Postural control: Posterior lean Standing balance support:  (HH assist) Standing balance-Leahy Scale: Poor Standing balance  comment: Pt struggles to maintain standing balance w/o +2 support                           ADL either performed or assessed with clinical judgement   ADL Overall ADL's : Needs assistance/impaired Eating/Feeding: Modified independent Eating/Feeding Details (indicate cue type and reason): able to eat/drink w/o spillage, despite hand tremor Grooming: Wash/dry face;Modified independent               Lower Body Dressing: Minimal assistance Lower Body Dressing Details (indicate cue type and reason): Min A for donning socks                     Vision         Perception         Praxis         Pertinent Vitals/Pain Pain Assessment Pain Assessment: No/denies pain     Extremity/Trunk Assessment Upper Extremity Assessment Upper Extremity Assessment: Generalized weakness   Lower Extremity Assessment Lower Extremity Assessment: Generalized weakness       Communication Communication Communication: No apparent difficulties   Cognition Arousal: Alert Behavior During Therapy: Anxious Cognition: Cognition impaired             OT - Cognition Comments: Pt able to state year but not month; is having some active hallucinations at present. Has no recall of events that led to her hospitalization. Frequent non sequitors during conversation.                 Following commands: Intact       Cueing  General Comments   Cueing Techniques: Verbal cues;Tactile cues      Exercises Other Exercises Other Exercises: Educ re: Home safety, DC recs, role of OT   Shoulder Instructions      Home Living Family/patient expects to be discharged to:: Private residence Living Arrangements: Spouse/significant other Available Help at Discharge: Family Type of Home: House Home Access: Stairs to enter;Ramped entrance Entrance Stairs-Number of Steps: ramped entrance from garage Entrance Stairs-Rails: Right Home Layout: Multi-level Alternate Level  Stairs-Number of Steps: split level home -- after entering house, 4 steps to get to main level   Bathroom Shower/Tub: Chief Strategy Officer: Handicapped height Bathroom Accessibility: No   Home Equipment: Tub bench;Rolling Walker (2 wheels)          Prior Functioning/Environment Prior Level of Function : Needs assist;History of Falls (last six months)             Mobility Comments: uses RW in home, manual WC in community ADLs Comments: Pt reports she is able to transfer, dress, toilet, bathe with Mod I; husband assists with all IADL    OT Problem List: Decreased strength;Decreased activity tolerance;Impaired balance (sitting and/or standing);Decreased cognition   OT Treatment/Interventions: Self-care/ADL training;DME and/or AE instruction;Therapeutic activities;Balance training;Therapeutic exercise;Patient/family education      OT Goals(Current goals can be found in the care plan section)   Acute Rehab OT Goals Patient Stated Goal: to go home OT Goal Formulation: With patient Time For Goal  Achievement: 12/04/23 Potential to Achieve Goals: Good   OT Frequency:  Min 2X/week    Co-evaluation              AM-PAC OT 6 Clicks Daily Activity     Outcome Measure Help from another person eating meals?: None Help from another person taking care of personal grooming?: A Little Help from another person toileting, which includes using toliet, bedpan, or urinal?: A Lot Help from another person bathing (including washing, rinsing, drying)?: A Lot Help from another person to put on and taking off regular upper body clothing?: A Little Help from another person to put on and taking off regular lower body clothing?: A Little 6 Click Score: 17   End of Session    Activity Tolerance: Patient tolerated treatment well Patient left: in bed;with call Cho/phone within reach;with bed alarm set;with family/visitor present  OT Visit Diagnosis: Unsteadiness on feet  (R26.81);Muscle weakness (generalized) (M62.81)                Time: 1315-1405 OT Time Calculation (min): 50 min Charges:  OT General Charges $OT Visit: 1 Visit OT Evaluation $OT Eval Moderate Complexity: 1 Mod OT Treatments $Self Care/Home Management : 38-52 mins Suzen Hock, PhD, MS, OTR/L 11/20/23, 3:18 PM

## 2023-11-20 NOTE — ED Notes (Signed)
 Pt soiled with urine at this time. This RN, [place] NT, and [place] NT had a full linen change at this time including, bed sheet, bed pad, gown, brief, and blanket. Pt cleaned and dry at this time.

## 2023-11-20 NOTE — ED Notes (Signed)
 Husband at bedside with questions in regards to pt's UTI asking if the medication given was appropriate after the urine culture come back. Dr Levander notified and new orders for abx placed. Husband also asking what SW's plan is for the pt.

## 2023-11-20 NOTE — TOC CM/SW Note (Signed)
..  Transition of Care [facility]) - Inpatient Brief Assessment   Patient Details  Name: Brittany Benton MRN: [id number] Date of Birth: 1955-10-01  [facility] of [facility]) CM/SW Contact:    Edsel DELENA Fischer, LCSW Phone Number: 11/20/2023, 5:48 PM   Clinical Narrative:  SW met with pt at bedside.  Husband was present as well and later PT arrived.  Pt was able to answer some questions however husband answered the majority.  Per pt and husband report: Pt lives with husband and that pt has parkinson .  Home has several steps and 4 steps that lead to where pt resides primarily.  Husband stated that in pt current condition he can use her wheel chair to get her  to the house but not up the stairs.  Pt was in [facility] about 3 weeks ago.  Pt was receiving HH from [facility] and PCS from [facility] 2x a week from 9am to 1pm.  Husband would like for pt to go to rehab first to build up pt strength before returning home.  Husband would like for pt to return to [facility] facility is inpatient rehab is recommended or continue with [facility] if pt is to discharge home   Transition of Care Asessment:

## 2023-11-20 NOTE — ED Notes (Signed)
 Breakfast meal tray placed at bedside at this time, pt is currently sleeping.

## 2023-11-20 NOTE — ED Notes (Addendum)
 Pt's husband is at bedside. States that he does not feel comfortable with discharging pt today. He states that she seems more confused and would like pt's UTI under control because pt is not very mobile at home. Dr. Viviann MD made aware with pt's concerns. SW consult ordered by previous shift.

## 2023-11-20 NOTE — TOC CM/SW Note (Incomplete)
..  Transition of Care [facility]) - Inpatient Brief Assessment   Patient Details  Name: Brittany Benton MRN: [id number] Date of Birth: [birth year]  Transition of Care [facility]) CM/SW Contact:    Edsel DELENA Fischer, LCSW Phone Number: 11/20/2023, 5:25 PM   Clinical Narrative:  SW met with pt at bedside.  Pt husband was present as well and later during visit PT arrived.  Pt husband stated that him and pt live alone and that pt has parkisson.  Pt was in rehab at [facility] about 3 weeks ago.  Husband stated that their are mutiple stairs to and in the home and that their are 4 stairs that lead to the bedroom.  Husband stated that he will need some assistance with getting pt up those stairs.  Husband stated that [facility] is seeing pt as well as [facility] ([facility]) FROM 9AM TO 1PM 3x a weel/   Transition of Care Asessment:

## 2023-11-20 NOTE — ED Provider Notes (Addendum)
 Emergency Medicine Observation Re-evaluation Note  Brittany Benton is a 68 y.o. female, seen on rounds today.  Pt initially presented to the ED for complaints of Psychiatric Evaluation  Currently, the patient is resting in bed. No reported issues from nursing team.   Physical Exam  BP 117/70 (BP Location: Left Arm)   Pulse 85   Temp 97.8 F (36.6 C) (Axillary)   Resp 18   Ht 5' (1.524 m)   Wt 72.6 kg   SpO2 95%   BMI 31.25 kg/m  Physical Exam General: Resting in bed  ED Course / MDM   No labs last 24 hours.  Plan  Current plan is for dispo per social work.    Levander Slate, MD 11/20/23 1543   Addendum: Received update from RN that family was inquiring regarding patient's recent UTI and medications.  On further review, I note that yesterday urine culture resulted positive for 70,000 colonies of E faecalis. With this, will add on Macrobid  for 5 days.      Levander Slate, MD 11/20/23 [phone]

## 2023-11-21 NOTE — ED Notes (Signed)
 VOL  TOC  PLACEMENT

## 2023-11-21 NOTE — ED Notes (Signed)
 Pt Breakfast provided at bedside

## 2023-11-21 NOTE — ED Notes (Signed)
Off bedpan

## 2023-11-21 NOTE — ED Notes (Signed)
 Pt states she feels wet, brief checked and was dry. No other comfort measures requested at this time.

## 2023-11-21 NOTE — ED Notes (Signed)
 Pt given a breakfast tray

## 2023-11-21 NOTE — NC FL2 (Signed)
 [place]  MEDICAID FL2 LEVEL OF CARE FORM     IDENTIFICATION  Patient Name: Brittany Benton Birthdate: 1956/02/24 Sex: female Admission Date (Current Location): 11/16/2023  [place] and [place] Number:  Chiropodist and Address:  [facility], [address] [place], [place] [address]      Provider Number: [id number]  Attending Physician Name and Address:  No att. providers found  Relative Name and Phone Number:  [name], [name] (Spouse)  [phone] (Mobile)    Current Level of Care: Hospital Recommended Level of Care: Skilled Nursing Facility, Nursing Facility Prior Approval Number:    Date Approved/Denied:   PASRR Number: [id number]  Discharge Plan: SNF    Current Diagnoses: Patient Active Problem List   Diagnosis Date Noted   Confusion 11/19/2023   Generalized weakness 10/06/2023   Urinary tract infection 10/06/2023   Hypotension 10/06/2023   Weakness generalized 10/06/2023   Depression    B12 deficiency 06/11/2022   Iron deficiency anemia 04/10/2022   Morbid obesity (HCC) 03/30/2022   Vitamin A  deficiency 08/15/2021   Parkinson's disease (HCC) 08/03/2021   Acute urinary retention 08/03/2021   Meningioma (HCC) 04/05/2021   Hypothyroidism 08/15/2020   Osteopenia 06/14/2020   Status post total hip replacement, 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   Osteoarthritis of right hip 03/26/2016   H/O gastric bypass 03/26/2016   Sinus tachycardia 08/01/2015   Obesity (BMI 30-39.9) 06/08/2015   OSA (obstructive sleep apnea) 06/08/2015   GERD without esophagitis 06/08/2015   Incisional hernia 06/08/2015   Arthritis of hip 03/23/2015   Left knee pain 02/15/2015   Vitamin D  deficiency 09/29/2014   Lump of right breast 09/22/2014   Prediabetes 09/22/2014   Pruritic dermatitis 09/22/2014   Obstructive sleep apnea 09/22/2014   Allergic rhinitis 09/15/2009   Hypersomnia 09/14/2009   Panic disorder 08/06/2008   Essential (primary) hypertension 07/16/2007    Hyperlipidemia 07/16/2007    Orientation RESPIRATION BLADDER Height & Weight     Self  Normal Incontinent Weight: 160 lb (72.6 kg) Height:  5' (152.4 cm)  BEHAVIORAL SYMPTOMS/MOOD NEUROLOGICAL BOWEL NUTRITION STATUS      Incontinent Diet  AMBULATORY STATUS COMMUNICATION OF NEEDS Skin   Extensive Assist Verbally Normal                       Personal Care Assistance Level of Assistance  Bathing, Feeding, Dressing Bathing Assistance: Maximum assistance Feeding assistance: Limited assistance Dressing Assistance: Maximum assistance     Functional Limitations Info  Sight, Hearing, Speech Sight Info: Adequate Hearing Info: Adequate Speech Info: Adequate    SPECIAL CARE FACTORS FREQUENCY  OT (By licensed OT), PT (By licensed PT)     PT Frequency: 5x a week OT Frequency: 3x a week            Contractures Contractures Info: Not present    Additional Factors Info  Code Status, Allergies Code Status Info: Full Allergies Info: codeine, morphine, tap, tramadol            Current Medications (11/21/2023):  This is the current hospital active medication list Current Facility-Administered Medications  Medication Dose Route Frequency Provider Last Rate Last Admin   acetaminophen  (TYLENOL ) tablet 650 mg  650 mg Oral Q6H PRN Mumma, Clotilda, MD   650 mg at 11/17/23 0931   Carbidopa -Levodopa  ER (SINEMET  CR) 25-100 MG tablet controlled release 2 tablet  2 tablet Oral BID [name], MD   2 tablet at 11/21/23 1018   clonazePAM  (KLONOPIN ) tablet 0.5  mg  0.5 mg Oral BID PRN Hampton, Tracie B, NP   0.5 mg at 11/21/23 0403   imipramine  (TOFRANIL ) tablet 50 mg  50 mg Oral BID [name], MD   50 mg at 11/21/23 1018   levothyroxine  (SYNTHROID ) tablet 44 mcg  44 mcg Oral Q0600 [name], MD   44 mcg at 11/21/23 9367   midodrine  (PROAMATINE ) tablet 5 mg  5 mg Oral TID WC [name], MD   5 mg at 11/21/23 1624   nitrofurantoin  (macrocrystal-monohydrate)  (MACROBID ) capsule 100 mg  100 mg Oral Q12H Ray, Nilsa, MD   100 mg at 11/21/23 1017   pantoprazole  (PROTONIX ) EC tablet 40 mg  40 mg Oral Daily [name], MD   40 mg at 11/21/23 1017   QUEtiapine  (SEROQUEL ) tablet 12.5 mg  12.5 mg Oral Nereida Ober, Tracie B, NP   12.5 mg at 11/21/23 1437   QUEtiapine  (SEROQUEL ) tablet 25 mg  25 mg Oral QHS Hampton, Tracie B, NP   25 mg at 11/20/23 2126   sertraline  (ZOLOFT ) tablet 100 mg  100 mg Oral Daily [name], MD   100 mg at 11/21/23 1215   Current Outpatient Medications  Medication Sig Dispense Refill   Ascorbic Acid (VITAMIN C PO) Take 1 tablet by mouth daily.     atorvastatin  (LIPITOR) 20 MG tablet TAKE 1 TABLET BY MOUTH EVERY EVENING 90 tablet 3   Carbidopa -Levodopa  ER (SINEMET  CR) 25-100 MG tablet controlled release Take 1 tablet by mouth 2 (two) times daily.     Carbidopa -Levodopa  ER (SINEMET  CR) 25-100 MG tablet controlled release Take 2 tablets by mouth.     cephALEXin  (KEFLEX ) 500 MG capsule Take 1 capsule (500 mg total) by mouth 2 (two) times daily for 5 days. 10 capsule 0   Cholecalciferol  (VITAMIN D -3) 1000 units CAPS Take 1 capsule by mouth daily.     famotidine  (PEPCID ) 20 MG tablet Take 1 tablet (20 mg total) by mouth every evening. For acid reflux 90 tablet 1   imipramine  (TOFRANIL ) 50 MG tablet TAKE ONE TABLET BY MOUTH TWICE DAILY 180 tablet 3   ketoconazole  (NIZORAL ) 2 % cream Apply 1 Application topically daily. Until rash resolves completely. 60 g 3   KLONOPIN  0.5 MG tablet Take 0.5 mg by mouth 2 (two) times daily.     levothyroxine  (SYNTHROID ) 88 MCG tablet Take 0.5 tablets (44 mcg total) by mouth daily. 45 tablet 1   midodrine  (PROAMATINE ) 5 MG tablet Take 1 tablet (5 mg total) by mouth 3 (three) times daily with meals. Hold if SBP > 120 mmHg and/or HR <65     Multiple Vitamin (MULTIVITAMIN WITH MINERALS) TABS tablet Take 1 tablet by mouth 2 (two) times daily. (Patient taking differently: Take 1 tablet by mouth  daily. Taking one tablet once daily)     omeprazole  (PRILOSEC) 20 MG capsule TAKE 1 CAPSULE BY MOUTH ONCE DAILY 90 capsule 0   sertraline  (ZOLOFT ) 100 MG tablet TAKE ONE TABLET BY MOUTH DAILY 90 tablet 0     Discharge Medications: Please see discharge summary for a list of discharge medications.  Relevant Imaging Results:  Relevant Lab Results:   Additional Information SS# [id number]  DOB: June 11, 1955  Edsel DELENA Fischer, LCSW

## 2023-11-21 NOTE — Progress Notes (Signed)
 Occupational Therapy Treatment Patient Details Name: Brittany Benton MRN: [id number] DOB: 03-02-1956 Today's Date: 11/21/2023   History of present illness Pt is a 68 year old female admitted with frequent falls with possible UTI as well as  degenerative changes related to Parkinson's disease, orthostatic hypotension     PMH significant for arkinson's disease , HTN and hypothyroidism, prior UTIs   OT comments  Pt seen for OT/PT co- treatment on this date. Upon arrival to room semi supine in bed, agreeable to tx. Pt requires MAXA+1 MODA+1 for bed mobility, trunk and LE support. Pt static sitting balance improved throughout prolong sitting ~62mins. Pt completed multiple standing bouts with MAXA+2 with use of RW + use of the elevated platform. Pt SPT from the EOB<>recliner with +2 no DME used. Pt resting comfortable with all needs in reach at the end of session. Pt making good progress toward goals, will continue to follow POC. Discharge recommendation remains appropriate.        If plan is discharge home, recommend the following:  A lot of help with walking and/or transfers;A lot of help with bathing/dressing/bathroom;Assistance with cooking/housework;Direct supervision/assist for medications management;Assist for transportation;Supervision due to cognitive status;Direct supervision/assist for financial management;Help with stairs or ramp for entrance   Equipment Recommendations  None recommended by OT    Recommendations for Other Services      Precautions / Restrictions Precautions Precautions: Fall Recall of Precautions/Restrictions: Intact Restrictions Weight Bearing Restrictions Per Provider Order: No       Mobility Bed Mobility Overal bed mobility: Needs Assistance Bed Mobility: Supine to Sit     Supine to sit: Mod assist, Max assist, +2 for physical assistance     General bed mobility comments: MODA+1 MAXA+1 for trunk and LE support    Transfers Overall transfer level: Needs  assistance Equipment used: Rolling walker (2 wheels) Transfers: Sit to/from Stand Sit to Stand: Max assist, +2 physical assistance           General transfer comment: Pt required bilateral knee and feet blocking during stand pivot transfers to the recliner from the EOB with use of RW.     Balance Overall balance assessment: Needs assistance Sitting-balance support: Feet supported, Bilateral upper extremity supported Sitting balance-Leahy Scale: Poor Sitting balance - Comments: Progress from MAX to MOD external support to remain sitting upright Postural control: Posterior lean Standing balance support: Bilateral upper extremity supported Standing balance-Leahy Scale: Poor Standing balance comment: Static standing with RW, PRN knee blocking                           ADL either performed or assessed with clinical judgement   ADL Overall ADL's : Needs assistance/impaired     Grooming: Wash/dry face;Set up;Sitting               Lower Body Dressing: Maximal assistance;Sit to/from stand   Toilet Transfer: Stand-pivot;Ambulation;Maximal assistance;+2 for physical assistance Toilet Transfer Details (indicate cue type and reason): Simulated stand pivot from EOB<>recliner           General ADL Comments: SetupA washing face in sitting     Communication Communication Communication: No apparent difficulties   Cognition Arousal: Alert Behavior During Therapy: Anxious, WFL for tasks assessed/performed Cognition: Cognition impaired           Executive functioning impairment (select all impairments): Problem solving, Initiation, Sequencing OT - Cognition Comments: A/Ox3  Following commands: Intact        Cueing   Cueing Techniques: Verbal cues, Tactile cues  Exercises Exercises: Other exercises Other Exercises Other Exercises: Edu: Safe ADL completion, transfer technique with DME           General Comments Husband in room a  the end of session    Pertinent Vitals/ Pain       Pain Assessment Pain Assessment: Faces Faces Pain Scale: No hurt                                                          Frequency  Min 2X/week        Progress Toward Goals  OT Goals(current goals can now be found in the care plan section)  Progress towards OT goals: Progressing toward goals  Acute Rehab OT Goals OT Goal Formulation: With patient Time For Goal Achievement: 12/04/23 Potential to Achieve Goals: Good  Plan      Co-evaluation    PT/OT/SLP Co-Evaluation/Treatment: Yes Reason for Co-Treatment: Complexity of the patient's impairments (multi-system involvement);To address functional/ADL transfers;For patient/therapist safety PT goals addressed during session: Mobility/safety with mobility;Balance;Proper use of DME OT goals addressed during session: ADL's and self-care;Strengthening/ROM      AM-PAC OT 6 Clicks Daily Activity     Outcome Measure   Help from another person eating meals?: None Help from another person taking care of personal grooming?: A Little Help from another person toileting, which includes using toliet, bedpan, or urinal?: A Lot Help from another person bathing (including washing, rinsing, drying)?: A Lot Help from another person to put on and taking off regular upper body clothing?: A Little Help from another person to put on and taking off regular lower body clothing?: A Little 6 Click Score: 17    End of Session Equipment Utilized During Treatment: Gait belt;Rolling walker (2 wheels)  OT Visit Diagnosis: Unsteadiness on feet (R26.81);Muscle weakness (generalized) (M62.81)   Activity Tolerance Patient tolerated treatment well   Patient Left in chair;with call [place]/phone within reach;with chair alarm set;with family/visitor present   Nurse Communication Mobility status        Time: 8484-8446 OT Time Calculation (min): 38 min  Charges: OT General  Charges $OT Visit: 1 Visit OT Treatments $Therapeutic Activity: 23-37 mins  Larraine Colas M.S. OTR/L  11/21/23, 4:28 PM

## 2023-11-21 NOTE — ED Notes (Signed)
 Pt and bed cleaned up

## 2023-11-21 NOTE — ED Notes (Addendum)
 RN at bedside due to pt stating she feels wet and no one as checked on her since midnight. Pt reoriented to this RN being in room to check brief. Pt states that she does recall that happening now. Brief check and appears dry. Pt anxious and states she sees and feels snakes everywhere but knows they are hallucinations. States snakes are in her blankets. Klonopin  given for anxiety. Pt provided with new blankets.

## 2023-11-21 NOTE — ED Provider Notes (Signed)
 Emergency Medicine Observation Re-evaluation Note  Brittany Benton is a 68 y.o. female, seen on rounds today.  Pt initially presented to the ED for complaints of Psychiatric Evaluation  Currently, the patient is is no acute distress. Denies any concerns at this time.  Physical Exam  Blood pressure 109/65, pulse 76, temperature 97.6 F (36.4 C), temperature source Oral, resp. rate 18, height 5' (1.524 m), weight 72.6 kg, SpO2 96%.  Physical Exam: General: No apparent distress Pulm: Normal WOB Neuro: Moving all extremities Psych: Resting comfortably     ED Course / MDM    I have reviewed the labs performed to date as well as medications administered while in observation.  Recent changes in the last 24 hours include: No acute events overnight.  Plan   Current plan: Patient awaiting placement, social work is helping to facilitate.    Suzanne Kirsch, MD 11/21/23 [phone]

## 2023-11-21 NOTE — Progress Notes (Signed)
 Physical Therapy Treatment Patient Details Name: Brittany Benton MRN: [id number] DOB: 02/27/1956 Today's Date: 11/21/2023   History of Present Illness Pt is a 68 year old female admitted with frequent falls with possible UTI as well as  degenerative changes related to Parkinson's disease, orthostatic hypotension     PMH significant for arkinson's disease , HTN and hypothyroidism, prior UTIs    PT Comments  PT/OT co-treatment performed this date. Pt more alert, however remains confused. Agreeable to session. Able to initiate mobility to EOB, however still demonstrates poor balance sitting. Able to improve seated balance with reaching. Several standing trials performed and eventually able to SPT from bed->chair with +2 and no AD. Discussed with RN, will need +2 for return back to bed. Anticipate pt may benefit from [facility] device. Will continue to progress as able.    If plan is discharge home, recommend the following: A lot of help with walking and/or transfers;Two people to help with walking and/or transfers;A lot of help with bathing/dressing/bathroom;Assistance with cooking/housework;Assist for transportation;Help with stairs or ramp for entrance   Can travel by private vehicle     No  Equipment Recommendations  None recommended by PT    Recommendations for Other Services       Precautions / Restrictions Precautions Precautions: Fall Recall of Precautions/Restrictions: Intact Restrictions Weight Bearing Restrictions Per Provider Order: No     Mobility  Bed Mobility Overal bed mobility: Needs Assistance Bed Mobility: Supine to Sit, Sit to Supine     Supine to sit: Mod assist, +2 for physical assistance     General bed mobility comments: needs cues for scooting towards EOB (max assist). Heavy post lean, able to progress to min assist. Able to sit from prolong time at EOB    Transfers Overall transfer level: Needs assistance Equipment used: Rolling walker (2 wheels) Transfers:  Sit to/from Stand, Bed to chair/wheelchair/BSC Sit to Stand: +2 physical assistance, Max assist Stand pivot transfers: Max assist, +2 physical assistance         General transfer comment: Able to trial multiple attempt for sit<>Stand with RW and with 2 person HHA. Additional 3rd person required for assist. During standing attempts, needs cues for sequencing/posture and B foot/knee blocked. Step stool used during standing attempts. Able to transfer via SPT from bed to recliner.    Ambulation/Gait               General Gait Details: not safe to ambulate this date   Stairs             Wheelchair Mobility     Tilt Bed    Modified Rankin (Stroke Patients Only)       Balance Overall balance assessment: Needs assistance Sitting-balance support: Feet unsupported Sitting balance-Leahy Scale: Poor     Standing balance support: Reliant on assistive device for balance Standing balance-Leahy Scale: Poor                              Communication Communication Communication: No apparent difficulties  Cognition Arousal: Alert Behavior During Therapy: Anxious   PT - Cognitive impairments: History of cognitive impairments                       PT - Cognition Comments: Pt is pleasant and agreeable to session Following commands: Intact      Cueing Cueing Techniques: Verbal cues, Tactile cues  Exercises Other Exercises Other Exercises: seated  EOB balance interventions performed including forward reaching x 10 reps with min/mod assist to facilitate.    General Comments        Pertinent Vitals/Pain Pain Assessment Pain Assessment: No/denies pain    Home Living                          Prior Function            PT Goals (current goals can now be found in the care plan section) Acute Rehab PT Goals Patient Stated Goal: to move better PT Goal Formulation: With patient Time For Goal Achievement: 12/04/23 Potential to Achieve  Goals: Fair Progress towards PT goals: Progressing toward goals    Frequency    Min 2X/week      PT Plan      Co-evaluation PT/OT/SLP Co-Evaluation/Treatment: Yes Reason for Co-Treatment: Complexity of the patient's impairments (multi-system involvement);Necessary to address cognition/behavior during functional activity;For patient/therapist safety PT goals addressed during session: Mobility/safety with mobility;Strengthening/ROM OT goals addressed during session: ADL's and self-care      AM-PAC PT 6 Clicks Mobility   Outcome Measure  Help needed turning from your back to your side while in a flat bed without using bedrails?: A Lot Help needed moving from lying on your back to sitting on the side of a flat bed without using bedrails?: A Lot Help needed moving to and from a bed to a chair (including a wheelchair)?: Total Help needed standing up from a chair using your arms (e.g., wheelchair or bedside chair)?: A Lot Help needed to walk in hospital room?: Total Help needed climbing 3-5 steps with a railing? : Total 6 Click Score: 9    End of Session Equipment Utilized During Treatment: Gait belt Activity Tolerance: Patient tolerated treatment well Patient left: in chair;with chair alarm set Nurse Communication: Mobility status PT Visit Diagnosis: Unsteadiness on feet (R26.81);Repeated falls (R29.6);Muscle weakness (generalized) (M62.81);History of falling (Z91.81);Difficulty in walking, not elsewhere classified (R26.2)     Time: 8484-8446 PT Time Calculation (min) (ACUTE ONLY): 38 min  Charges:    $Therapeutic Activity: 8-22 mins PT General Charges $$ ACUTE PT VISIT: 1 Visit                     Corean Dade, PT, DPT, GCS [phone]    Sapir Lavey 11/21/2023, 4:17 PM

## 2023-11-21 NOTE — ED Notes (Signed)
 VOL/TOC working on to get patient back home

## 2023-11-22 ENCOUNTER — Telehealth: Payer: Self-pay

## 2023-11-22 MED ORDER — CLONAZEPAM 0.5 MG PO TABS
0.5000 mg | ORAL_TABLET | Freq: Two times a day (BID) | ORAL | Status: DC
  Administered 2023-11-22 – 2023-11-25 (×7): 0.5 mg via ORAL
  Filled 2023-11-22 (×7): qty 1

## 2023-11-22 MED ORDER — TRAZODONE HCL 100 MG PO TABS
100.0000 mg | ORAL_TABLET | ORAL | Status: AC
  Administered 2023-11-22: 100 mg via ORAL
  Filled 2023-11-22: qty 1

## 2023-11-22 NOTE — Telephone Encounter (Signed)
 Copied from CRM [id number]. Topic: General - Billing Inquiry >> Nov 22, 2023  9:00 AM Myrick T wrote: Reason for CRM: patient spouse called stated patient is in the ED and he needs to speak with Dr Gasper about her health.

## 2023-11-22 NOTE — ED Notes (Signed)
 Pt provided peri care at this time. Pt had large BM. Pt assisted back in bed x3 assist. Bed alarm and all fall bundle precautions in place at this time.

## 2023-11-22 NOTE — ED Notes (Signed)
 Pt brief changed and placed in new dry brief, repositioned in bed

## 2023-11-22 NOTE — ED Notes (Signed)
 Pt is yelling and screaming and trying to get out of bed. MD aware. Will try some PO trazadone for sleep.

## 2023-11-22 NOTE — Telephone Encounter (Signed)
 See previous encounter from 8/5. Patient's husband states she was never discharged and she seemed to worsen. States her mobility has not been good and hospital care team is looking into physical therapy care for her. He has concerns about her being dehydrated as well- is speaking with hospital staff about this. Wanted to make Dr. Gasper aware of patient's condition.

## 2023-11-22 NOTE — ED Provider Notes (Signed)
-----------------------------------------   9:59 AM on 11/22/2023 -----------------------------------------   Blood pressure 134/65, pulse 88, temperature 97.6 F (36.4 C), resp. rate 18, height 5' (1.524 m), weight 72.6 kg, SpO2 99%.  The patient is calm and cooperative at this time.  There have been no acute events since the last update.  Awaiting disposition plan from case management/social work.    Monie Shere, [name], DO 11/22/23 [phone]

## 2023-11-22 NOTE — ED Notes (Addendum)
 Entered the pts room they were slouching in the chair, this tech, [place] EDT, [place] RN assisted the pt back into the bed. The pt had a large BM, and voided. The three of us  cleaned the patient and changed the linen on the pts bed. Pt is clean and dry, call [place] within reach, no further needs at this time.

## 2023-11-22 NOTE — ED Notes (Signed)
 This RN to bedside to introduce self to pt. Pt is alert and answering questions. Pt is dressed out in gown. Pt in no acute distress and has no complaints at this time.

## 2023-11-22 NOTE — ED Notes (Signed)
 This RN cleaned up mess at bedside. Several cups of water turned over on bedside table and in floor. Pt is NOT to have cups unless they have a lid on them

## 2023-11-22 NOTE — ED Notes (Signed)
 TOC

## 2023-11-23 NOTE — ED Notes (Addendum)
 Pt declined breakfast at this time. Warm blanket given

## 2023-11-23 NOTE — ED Notes (Signed)
Graham crackers and Sprite given per pt request

## 2023-11-23 NOTE — ED Provider Notes (Signed)
-----------------------------------------   1:12 PM on 11/23/2023 ----------------------------------------- Patient was unable to take her lunchtime medications due to sleepiness.  However I went in and evaluated the patient she is now awake and alert she is answering questions she denies any complaints she is asking for something to drink.  Will adjust the patient's normal medications give her something to drink and continue to closely monitor.  I suspect the patient's increased fatigue this morning is likely related to the patient's trazodone  use last night.  Patient appears appropriate at this time.   Dorothyann Drivers, MD 11/23/23 1313

## 2023-11-23 NOTE — ED Notes (Signed)
 Pts brief changed

## 2023-11-23 NOTE — ED Notes (Signed)
 Pt provided with pericare and clean dry brief at this time.

## 2023-11-24 NOTE — ED Notes (Signed)
 Dinner tray given

## 2023-11-24 NOTE — ED Notes (Signed)
 Pt given a lunch tray. But still asleep

## 2023-11-24 NOTE — ED Provider Notes (Signed)
 Emergency Medicine Observation Re-evaluation Note  Brittany Benton is a 68 y.o. female, seen on rounds today.  Pt initially presented to the ED for complaints of Psychiatric Evaluation Currently, the patient is resting.  Physical Exam  BP (!) 148/85 (BP Location: Right Arm)   Pulse 80   Temp (!) 97.1 F (36.2 C)   Resp 18   Ht 5' (1.524 m)   Wt 72.6 kg   SpO2 97%   BMI 31.25 kg/m  Physical Exam .Gen:  No acute distress Resp:  Breathing easily and comfortably, no accessory muscle usage Neuro:  Moving all four extremities, no gross focal neuro deficits Psych:  Resting currently, calm when awake   ED Course / MDM  EKG:   I have reviewed the labs performed to date as well as medications administered while in observation.  Recent changes in the last 24 hours include no acute events.  Plan  Current plan is for SW dispo.    Waymond Lorelle Cummins, MD 11/24/23 [phone]

## 2023-11-24 NOTE — ED Notes (Signed)
 Pt given a breakfast tray

## 2023-11-24 NOTE — ED Notes (Signed)
 Pt provided with pericare and clean dry brief at this time.

## 2023-11-25 ENCOUNTER — Telehealth: Payer: Self-pay

## 2023-11-25 MED ORDER — NITROFURANTOIN MONOHYD MACRO 100 MG PO CAPS: 100.0000 mg | ORAL_CAPSULE | Freq: Two times a day (BID) | ORAL | 0 refills | Status: DC

## 2023-11-25 NOTE — Telephone Encounter (Signed)
 Patient is apparently being discharged from psych holding this evening. Husband reports she continues to have hallucinations and concerned about ability to ambulate. Schedule follow up appointment on 11-29-23. Advised to take to take back to ER if hallucinations interfered with ADLs or unable to ambulate.

## 2023-11-25 NOTE — TOC Transition Note (Addendum)
 Transition of Care [facility]) - Discharge Note   Patient Details  Name: Brittany Benton MRN: [id number] Date of Birth: 08-19-1955  [facility] CM/SW Contact:  [place] L Gaby Harney, LCSW Phone Number: 11/25/2023, 10:38 AM   Clinical Narrative:     CSW spoke with spouse to confirm that [facility] will continue servicing patient. Orders placed for PT/OT and SW. CSW discussed DME needs. Spouse stated that patient has a shower bench, BSC, raised toilet seat, rails in the shower etc. There was no need for additional equipment at this time. [facility] will assess further.   Spouse advised that EMS will be scheduled for the later part of the afternoon for transport.   Palliative care received the referral. Spouse remains agreeable to discharge to the home today. Spouse advised that two more facilities have declined patient.   No further TOC needs assessed. TOC signing off.    12:00pm: DME ordered. OT recommended a hospital bed for patient. Attending updated. Order placed.    12:51: Request for sit and stand order to be entered by provider.   Patient Goals and CMS Choice            Discharge Placement                       Discharge Plan and Services Additional resources added to the After Visit Summary for                                       Social Drivers of Health (SDOH) Interventions SDOH Screenings   Food Insecurity: No Food Insecurity (10/06/2023)  Housing: Low Risk  (10/06/2023)  Transportation Needs: No Transportation Needs (10/06/2023)  Utilities: Not At Risk (10/06/2023)  Alcohol Screen: Low Risk  (03/30/2022)  Depression (PHQ2-9): Low Risk  (11/04/2023)  Recent Concern: Depression (PHQ2-9) - High Risk (09/12/2023)  Financial Resource Strain: Low Risk  (09/20/2023)  Physical Activity: Insufficiently Active (11/14/2022)  Social Connections: Socially Isolated (10/06/2023)  Stress: No Stress Concern Present (11/14/2022)  Tobacco Use: Low Risk  (11/16/2023)   Health Literacy: Adequate Health Literacy (11/14/2022)     Readmission Risk Interventions     No data to display

## 2023-11-25 NOTE — Progress Notes (Signed)
 Physical Therapy Treatment Patient Details Name: Brittany Benton MRN: [id number] DOB: 07-May-1955 Today's Date: 11/25/2023   History of Present Illness Pt is a 68 year old female admitted with frequent falls with possible UTI as well as  degenerative changes related to Parkinson's disease, orthostatic hypotension     PMH significant for Parkinson's disease , HTN and hypothyroidism, prior UTIs    PT Comments  Pt received on high stretcher in ED for OT co-tx in order to provide safe progressive mobility and assess pt's tolerance for skilled therapy. Pt required Mod/MaxA for majority of mobility and safe transfer from stretcher to bedside chair with RW. Pt remains a high fall risk and will need significant assist if returning home. Pt will continue continue to benefit from ongoing PT post d/c.   If plan is discharge home, recommend the following: A lot of help with walking and/or transfers;Two people to help with walking and/or transfers;A lot of help with bathing/dressing/bathroom;Assistance with cooking/housework;Assist for transportation;Help with stairs or ramp for entrance   Can travel by private vehicle     No  Equipment Recommendations  None recommended by PT    Recommendations for Other Services       Precautions / Restrictions Precautions Precautions: Fall Recall of Precautions/Restrictions: Impaired Restrictions Weight Bearing Restrictions Per Provider Order: No     Mobility  Bed Mobility Overal bed mobility: Needs Assistance Bed Mobility: Supine to Sit     Supine to sit: Mod assist, +2 for physical assistance, +2 for safety/equipment     General bed mobility comments: Sitting edge of stretcher for several minutes, feet unsupported, MinA at times to prevent posterior LOB.    Transfers Overall transfer level: Needs assistance Equipment used: Rolling walker (2 wheels) Transfers: Sit to/from Stand Sit to Stand: Min assist, +2 physical assistance, +2 safety/equipment,  From elevated surface           General transfer comment: MinA of 2 due to high stretcher with pt's feet dangling 10 inches from floor.    Ambulation/Gait Ambulation/Gait assistance: Mod assist Gait Distance (Feet): 2 Feet Assistive device: Rolling walker (2 wheels) Gait Pattern/deviations: Step-to pattern Gait velocity: decr     General Gait Details:  (Modified gait with RW from stretcher to bedside chair. with Min/ModA for balance and maneuvering of RW.)   Stairs             Wheelchair Mobility     Tilt Bed    Modified Rankin (Stroke Patients Only)       Balance Overall balance assessment: Needs assistance Sitting-balance support: Feet supported, Bilateral upper extremity supported Sitting balance-Leahy Scale: Poor Sitting balance - Comments: poor, progress to fair Postural control: Posterior lean Standing balance support: Bilateral upper extremity supported, During functional activity, Reliant on assistive device for balance Standing balance-Leahy Scale: Poor                              Communication Communication Communication: No apparent difficulties  Cognition Arousal: Alert Behavior During Therapy: WFL for tasks assessed/performed   PT - Cognitive impairments: History of cognitive impairments                       PT - Cognition Comments: Pt is pleasant and cooperative, oriented to self and situation. Fluctuates on memory Following commands: Impaired Following commands impaired: Follows one step commands with increased time    Cueing Cueing Techniques: Verbal cues, Tactile  cues  Exercises Other Exercises Other Exercises: Pt educated on role of PT and current POC.    General Comments        Pertinent Vitals/Pain Pain Assessment Pain Assessment: No/denies pain    Home Living                          Prior Function            PT Goals (current goals can now be found in the care plan section) Acute  Rehab PT Goals Patient Stated Goal: to move better    Frequency    Min 2X/week      PT Plan      Co-evaluation   Reason for Co-Treatment: Complexity of the patient's impairments (multi-system involvement);To address functional/ADL transfers;For patient/therapist safety PT goals addressed during session: Mobility/safety with mobility;Balance;Proper use of DME OT goals addressed during session: ADL's and self-care;Strengthening/ROM      AM-PAC PT 6 Clicks Mobility   Outcome Measure  Help needed turning from your back to your side while in a flat bed without using bedrails?: A Lot Help needed moving from lying on your back to sitting on the side of a flat bed without using bedrails?: A Lot Help needed moving to and from a bed to a chair (including a wheelchair)?: Total Help needed standing up from a chair using your arms (e.g., wheelchair or bedside chair)?: A Lot Help needed to walk in hospital room?: Total Help needed climbing 3-5 steps with a railing? : Total 6 Click Score: 9    End of Session Equipment Utilized During Treatment: Gait belt Activity Tolerance: Patient tolerated treatment well Patient left: in chair;with call Bur/phone within reach;with chair alarm set Nurse Communication: Mobility status PT Visit Diagnosis: Unsteadiness on feet (R26.81);Repeated falls (R29.6);Muscle weakness (generalized) (M62.81);History of falling (Z91.81);Difficulty in walking, not elsewhere classified (R26.2)     Time: 9041-8977 PT Time Calculation (min) (ACUTE ONLY): 24 min  Charges:    $Therapeutic Activity: 8-22 mins PT General Charges $$ ACUTE PT VISIT: 1 Visit                    Brittany Benton, PTA  Brittany Benton 11/25/2023, 11:24 AM

## 2023-11-25 NOTE — TOC CM/SW Note (Cosign Needed)
  Patient will need a sit to stand to transfer from bed to chair/ commode. Without this they would be confined to the bed

## 2023-11-25 NOTE — Telephone Encounter (Signed)
 Copied from CRM #[id number]. Topic: Clinical - Medical Advice >> Nov 25, 2023  8:22 AM Franky GRADE wrote: Reason for CRM: Patient's husband [name] would like to speak with Dr.Fisher, Patient is currently admitted in the hospital due to anxiety concerns. She Called EMS on herself last week and per [name] patient felt like she was on the edge. Offered a triage nurse but he declined and would just like to speak with Dr.Fisher at any point today.

## 2023-11-25 NOTE — ED Provider Notes (Signed)
 Ms. Petko currently alert, in no distress.  Working with physical therapy  Current plan is for discharge to continue care at home.  Ms. Pam of [facility] team helping to arrange.  Home health orders requested.  TOC team has been discussing with the patient's husband current plan is discharge back to her home residence with her husband (who has been informed by [facility] and is agreeable with this). Plan to transport home ~ 3pm today   Dicky Anes, MD 11/25/23 1016

## 2023-11-25 NOTE — TOC Progression Note (Addendum)
 Transition of Care [facility]) - Progression Note    Patient Details  Name: Brittany Benton MRN: [id number] Date of Birth: 1955-10-23  Transition of [facility]) CM/SW Contact  [place], [place] Phone Number: 11/25/2023, 8:49 AM  Clinical Narrative:     CSW contacted spouse, Nealy Hickmon, regarding placement. [facility] was the only facility family chose and patient was declined. CSW explained that bed searches can be extended however patient has been here for eight days and a decision is needed. CSW advised spouse that patient can not continue to board in the ED. CSW further explained that the longer the patient boards in the ED the more her needs are not being met. CSW advised that patient is not receiving immediate medical care. CSW advised that her hygiene needs are being met and she is being cared for but she is not being treated for a medical condition.   Mr. Lerette advised that he was waiting to here from patient PCP to determine what services can be provided. CSW provided resources for [facility] as Mr. Geisel advised that patient needs round the clock care and he is unable to afford private care. CSW explained that [facility] offers day programs and they also have Home Health care that can come in and assist re: get patient ready for bed, feed her etc.   CSW and Mr. Lottes agreed to follow-up with one another in two hours, when his PCP calls him back.    9:54am: Call received from Mr. August. Mr. Eddington was very accepting that his wife would not likely receive a placement from a SNF. CSW and spouse again discussed home services that could be in place to assist. Spouse was agreeable to CSW referring to [place] care. He stated that Palliative Care spoke to him and his wife when she was in a facility before. He also stated that he contacted [facility] regarding PT/OT but services were halted because [name] came to [facility]. He was agreeable to CSW contacting [facility] on patient behalf to initiate services.   Mr. Ferdinand agreed  that discharge was appropriate. EMS is needed. He advised that 3pm would be ideal for his wife to return to the home.    10:31am: Bed offers for [facility] and [facility] declined for patient.       10:34: CSW spoke with [facility]). Patient is already active with Home Health. CSW advised of new orders for PT/OT and SW.         Expected Discharge Plan and Services                                               Social Drivers of Health (SDOH) Interventions SDOH Screenings   Food Insecurity: No Food Insecurity (10/06/2023)  Housing: Low Risk  (10/06/2023)  Transportation Needs: No Transportation Needs (10/06/2023)  Utilities: Not At Risk (10/06/2023)  Alcohol Screen: Low Risk  (03/30/2022)  Depression (PHQ2-9): Low Risk  (11/04/2023)  Recent Concern: Depression (PHQ2-9) - High Risk (09/12/2023)  Financial Resource Strain: Low Risk  (09/20/2023)  Physical Activity: Insufficiently Active (11/14/2022)  Social Connections: Socially Isolated (10/06/2023)  Stress: No Stress Concern Present (11/14/2022)  Tobacco Use: Low Risk  (11/16/2023)  Health Literacy: Adequate Health Literacy (11/14/2022)    Readmission Risk Interventions     No data to display

## 2023-11-25 NOTE — ED Notes (Signed)
 Discharge papers reviewed with husband

## 2023-11-25 NOTE — Telephone Encounter (Signed)
 Copied from CRM [id number]. Topic: Referral - Status >> Nov 25, 2023  3:11 PM Annabella NOVAK wrote: [facility] Referral Specialist  From [place] [phone]    Wanted to inform PCP referral was received for palliative care and will reach out to the patient.

## 2023-11-25 NOTE — ED Provider Notes (Signed)
 As recommended by occupational therapy, home hospital bed order placed.   Dicky Anes, MD 11/25/23 1139

## 2023-11-25 NOTE — TOC CM/SW Note (Addendum)
..  Transition of Care [facility]) - Inpatient Brief Assessment   Patient Details  Name: Brittany Benton MRN: [id number] Date of Birth: Sep 11, 1955  [facility]) CM/SW Contact:    Brittany DELENA Fischer, LCSW Phone Number: 11/25/2023, 5:35 PM   Clinical Narrative:  SW reached out to staff Alonso Budge, MD and Therisa Sheffield, OT) regarding sit to stand dme for pt.  SW created note, waiting on MD to cosign.  SW notified [facility] that pt will be discharging. [facility] to follow up with pt.   Transition of Care Asessment:

## 2023-11-25 NOTE — Progress Notes (Signed)
 Occupational Therapy Treatment Patient Details Name: Brittany Benton MRN: [id number] DOB: 09-10-1955 Today's Date: 11/25/2023   History of present illness Pt is a 68 year old female admitted with frequent falls with possible UTI as well as  degenerative changes related to Parkinson's disease, orthostatic hypotension     PMH significant for Parkinson's disease , HTN and hypothyroidism, prior UTIs   OT comments  Chart reviewed to date, pt greeted in bed, oriented to self and place, but tangential and confabulatory throughout. This therapist is known to this patient from previous admission, this appears altered from baseline. Pt performs bed mobility with MOD-MAX A, STS with MIN  A+2 from high stretcher height, step pivot tranfers to bedside performed with MIN A+2 with RW. Frequent multi- modal cues provided throughout for task completion. Supervision required for grooming tasks. Pt is making progress towards goals, discharge recommendation remains appropriate.       If plan is discharge home, recommend the following:  A lot of help with walking and/or transfers;A lot of help with bathing/dressing/bathroom;Assistance with cooking/housework;Direct supervision/assist for medications management;Assist for transportation;Supervision due to cognitive status;Direct supervision/assist for financial management;Help with stairs or ramp for entrance   Equipment Recommendations  Hospital bed    Recommendations for Other Services      Precautions / Restrictions Precautions Precautions: Fall Recall of Precautions/Restrictions: Impaired Restrictions Weight Bearing Restrictions Per Provider Order: No       Mobility Bed Mobility Overal bed mobility: Needs Assistance Bed Mobility: Supine to Sit     Supine to sit: Mod assist, +2 for physical assistance, +2 for safety/equipment          Transfers Overall transfer level: Needs assistance Equipment used: Rolling walker (2 wheels) Transfers: Sit  to/from Stand Sit to Stand: Min assist, +2 physical assistance, +2 safety/equipment, From elevated surface (from ED stretcher)                 Balance Overall balance assessment: Needs assistance Sitting-balance support: Feet supported, Bilateral upper extremity supported Sitting balance-Leahy Scale: Poor Sitting balance - Comments: poor, progress to fair Postural control: Posterior lean Standing balance support: Bilateral upper extremity supported, During functional activity, Reliant on assistive device for balance Standing balance-Leahy Scale: Poor                             ADL either performed or assessed with clinical judgement   ADL Overall ADL's : Needs assistance/impaired     Grooming: Brushing hair;Sitting;Supervision/safety               Lower Body Dressing: Maximal assistance;Sitting/lateral leans   Toilet Transfer: Minimal assistance;Rolling walker (2 wheels) Toilet Transfer Details (indicate cue type and reason): simulated to bedside chair with RW, +2 for safety Toileting- Clothing Manipulation and Hygiene: Maximal assistance              Extremity/Trunk Assessment              Vision       Perception     Praxis     Communication Communication Communication: No apparent difficulties   Cognition Arousal: Alert Behavior During Therapy: WFL for tasks assessed/performed Cognition: Cognition impaired, History of cognitive impairments, No family/caregiver present to determine baseline (pt is known by this therapist from previous admissions)   Orientation impairments: Time, Situation Awareness: Intellectual awareness impaired, Online awareness impaired Memory impairment (select all impairments): Short-term memory, Non-declarative long-term memory, Working [employer] fast streamer, Conservation officer, historic buildings Attention  impairment (select first level of impairment): Sustained attention Executive functioning impairment (select all impairments):  Problem solving, Initiation, Sequencing OT - Cognition Comments: tangential and confabulatory; states she is on vacation but also able to state she is in the hospital                 Following commands: Impaired Following commands impaired: Follows one step commands with increased time      Cueing   Cueing Techniques: Verbal cues, Tactile cues  Exercises Other Exercises Other Exercises: edu re role of OT, role of rehab    Shoulder Instructions       General Comments      Pertinent Vitals/ Pain       Pain Assessment Pain Assessment: Faces Faces Pain Scale: No hurt  Home Living                                          Prior Functioning/Environment              Frequency  Min 2X/week        Progress Toward Goals  OT Goals(current goals can now be found in the care plan section)  Progress towards OT goals: Progressing toward goals  Acute Rehab OT Goals Time For Goal Achievement: 12/04/23  Plan      Co-evaluation    PT/OT/SLP Co-Evaluation/Treatment: Yes Reason for Co-Treatment: Complexity of the patient's impairments (multi-system involvement);To address functional/ADL transfers;For patient/therapist safety   OT goals addressed during session: ADL's and self-care;Strengthening/ROM      AM-PAC OT 6 Clicks Daily Activity     Outcome Measure   Help from another person eating meals?: None Help from another person taking care of personal grooming?: A Little Help from another person toileting, which includes using toliet, bedpan, or urinal?: A Lot Help from another person bathing (including washing, rinsing, drying)?: A Lot Help from another person to put on and taking off regular upper body clothing?: A Little Help from another person to put on and taking off regular lower body clothing?: A Lot 6 Click Score: 16    End of Session Equipment Utilized During Treatment: Rolling walker (2 wheels);Gait belt  OT Visit Diagnosis:  Unsteadiness on feet (R26.81);Muscle weakness (generalized) (M62.81)   Activity Tolerance Patient tolerated treatment well   Patient Left in chair;with call Klaas/phone within reach;with chair alarm set;with nursing/sitter in room   Nurse Communication Mobility status        Time: 8998-8975 OT Time Calculation (min): 23 min  Charges: OT General Charges $OT Visit: 1 Visit OT Treatments $Self Care/Home Management : 8-22 mins  Therisa Sheffield, OTD OTR/L  11/25/23, 11:07 AM

## 2023-11-25 NOTE — ED Notes (Signed)
 Pt rolled with 1 assist per side to change all bedding, pads, brief and gown r/t urinary incontinence. Pt is cooperative, yet continues to display hallucinations. Warm blankets provided, bed alarm tested. Call Sorrels within reach, bed low and locked.

## 2023-11-25 NOTE — ED Notes (Signed)
 Pt had food and water all over the floor and this tech and nurse cleaned it all up.

## 2023-11-25 NOTE — ED Notes (Signed)
 [facility] called for  transport  home

## 2023-11-25 NOTE — ED Notes (Signed)
 Pt belongings returned to husband at bedside

## 2023-11-25 NOTE — Progress Notes (Addendum)
 [facility] Liaison Note  Referral received from [facility], [place], LCSW,for outpatient palliative care for patient after she discharges home today.  Referral submitted.  Please call with any palliative care questions or concerns.  Thank you for the opportunity to participate in this patient's care  [facility] Liaison 336 [phone]

## 2023-11-25 NOTE — ED Notes (Signed)
 VOL/TOC Placement

## 2023-11-25 NOTE — Progress Notes (Signed)
    Durable Medical Equipment  (From admission, onward)           Start     Ordered   11/25/23 1139  For home use only DME Hospital bed  Once       Question Answer Comment  Length of Need 6 Months   Patient has (list medical condition): parkinsons   The above medical condition requires: Patient requires the ability to reposition frequently   Bed type Semi-electric      11/25/23 1138

## 2023-11-25 NOTE — Progress Notes (Signed)
 Physical Therapy Treatment Patient Details Name: Brittany Benton MRN: [id number] DOB: 07-May-1955 Today's Date: 11/25/2023   History of Present Illness Pt is a 68 year old female admitted with frequent falls with possible UTI as well as  degenerative changes related to Parkinson's disease, orthostatic hypotension     PMH significant for Parkinson's disease , HTN and hypothyroidism, prior UTIs    PT Comments  Pt received on high stretcher in ED for OT co-tx in order to provide safe progressive mobility and assess pt's tolerance for skilled therapy. Pt required Mod/MaxA for majority of mobility and safe transfer from stretcher to bedside chair with RW. Pt remains a high fall risk and will need significant assist if returning home. Pt will continue continue to benefit from ongoing PT post d/c.   If plan is discharge home, recommend the following: A lot of help with walking and/or transfers;Two people to help with walking and/or transfers;A lot of help with bathing/dressing/bathroom;Assistance with cooking/housework;Assist for transportation;Help with stairs or ramp for entrance   Can travel by private vehicle     No  Equipment Recommendations  None recommended by PT    Recommendations for Other Services       Precautions / Restrictions Precautions Precautions: Fall Recall of Precautions/Restrictions: Impaired Restrictions Weight Bearing Restrictions Per Provider Order: No     Mobility  Bed Mobility Overal bed mobility: Needs Assistance Bed Mobility: Supine to Sit     Supine to sit: Mod assist, +2 for physical assistance, +2 for safety/equipment     General bed mobility comments: Sitting edge of stretcher for several minutes, feet unsupported, MinA at times to prevent posterior LOB.    Transfers Overall transfer level: Needs assistance Equipment used: Rolling walker (2 wheels) Transfers: Sit to/from Stand Sit to Stand: Min assist, +2 physical assistance, +2 safety/equipment,  From elevated surface           General transfer comment: MinA of 2 due to high stretcher with pt's feet dangling 10 inches from floor.    Ambulation/Gait Ambulation/Gait assistance: Mod assist Gait Distance (Feet): 2 Feet Assistive device: Rolling walker (2 wheels) Gait Pattern/deviations: Step-to pattern Gait velocity: decr     General Gait Details:  (Modified gait with RW from stretcher to bedside chair. with Min/ModA for balance and maneuvering of RW.)   Stairs             Wheelchair Mobility     Tilt Bed    Modified Rankin (Stroke Patients Only)       Balance Overall balance assessment: Needs assistance Sitting-balance support: Feet supported, Bilateral upper extremity supported Sitting balance-Leahy Scale: Poor Sitting balance - Comments: poor, progress to fair Postural control: Posterior lean Standing balance support: Bilateral upper extremity supported, During functional activity, Reliant on assistive device for balance Standing balance-Leahy Scale: Poor                              Communication Communication Communication: No apparent difficulties  Cognition Arousal: Alert Behavior During Therapy: WFL for tasks assessed/performed   PT - Cognitive impairments: History of cognitive impairments                       PT - Cognition Comments: Pt is pleasant and cooperative, oriented to self and situation. Fluctuates on memory Following commands: Impaired Following commands impaired: Follows one step commands with increased time    Cueing Cueing Techniques: Verbal cues, Tactile  cues  Exercises Other Exercises Other Exercises: Pt educated on role of PT and current POC.    General Comments        Pertinent Vitals/Pain Pain Assessment Pain Assessment: No/denies pain    Home Living                          Prior Function            PT Goals (current goals can now be found in the care plan section) Acute  Rehab PT Goals Patient Stated Goal: to move better    Frequency    Min 2X/week      PT Plan      Co-evaluation   Reason for Co-Treatment: Complexity of the patient's impairments (multi-system involvement);To address functional/ADL transfers;For patient/therapist safety PT goals addressed during session: Mobility/safety with mobility;Balance;Proper use of DME OT goals addressed during session: ADL's and self-care;Strengthening/ROM      AM-PAC PT 6 Clicks Mobility   Outcome Measure  Help needed turning from your back to your side while in a flat bed without using bedrails?: A Lot Help needed moving from lying on your back to sitting on the side of a flat bed without using bedrails?: A Lot Help needed moving to and from a bed to a chair (including a wheelchair)?: Total Help needed standing up from a chair using your arms (e.g., wheelchair or bedside chair)?: A Lot Help needed to walk in hospital room?: Total Help needed climbing 3-5 steps with a railing? : Total 6 Click Score: 9    End of Session Equipment Utilized During Treatment: Gait belt Activity Tolerance: Patient tolerated treatment well Patient left: in chair;with call Bur/phone within reach;with chair alarm set Nurse Communication: Mobility status PT Visit Diagnosis: Unsteadiness on feet (R26.81);Repeated falls (R29.6);Muscle weakness (generalized) (M62.81);History of falling (Z91.81);Difficulty in walking, not elsewhere classified (R26.2)     Time: 9041-8977 PT Time Calculation (min) (ACUTE ONLY): 24 min  Charges:    $Therapeutic Activity: 8-22 mins PT General Charges $$ ACUTE PT VISIT: 1 Visit                    Darice Bohr, PTA  Darice JAYSON Bohr 11/25/2023, 11:24 AM

## 2023-11-27 ENCOUNTER — Other Ambulatory Visit: Payer: Self-pay | Admitting: Family Medicine

## 2023-11-27 ENCOUNTER — Telehealth: Payer: Self-pay

## 2023-11-27 NOTE — Telephone Encounter (Signed)
 Copied from CRM [id number]. Topic: Clinical - Medical Advice >> Nov 27, 2023  2:19 PM Chasity T wrote: Reason for CRM: Brittany Benton from [facility] to give the annual screening. The results were for a PAD screening which showed the right foot was 0.80 mild (low) , left foot 1.08 normal

## 2023-11-28 ENCOUNTER — Telehealth: Payer: Self-pay

## 2023-11-28 ENCOUNTER — Other Ambulatory Visit: Payer: Self-pay

## 2023-11-28 ENCOUNTER — Emergency Department

## 2023-11-28 ENCOUNTER — Inpatient Hospital Stay
Admission: EM | Admit: 2023-11-28 | Discharge: 2023-12-03 | DRG: 057 | Disposition: A | Discharge status: Skilled Nursing Facility | Attending: Hospitalist | Admitting: Hospitalist

## 2023-11-28 DIAGNOSIS — F419 Anxiety disorder, unspecified: Secondary | ICD-10-CM | POA: Diagnosis present

## 2023-11-28 DIAGNOSIS — E876 Hypokalemia: Secondary | ICD-10-CM | POA: Diagnosis present

## 2023-11-28 DIAGNOSIS — R531 Weakness: Principal | ICD-10-CM

## 2023-11-28 DIAGNOSIS — F0282 Dementia in other diseases classified elsewhere, unspecified severity, with psychotic disturbance: Secondary | ICD-10-CM | POA: Diagnosis present

## 2023-11-28 DIAGNOSIS — F22 Delusional disorders: Secondary | ICD-10-CM | POA: Diagnosis present

## 2023-11-28 DIAGNOSIS — E039 Hypothyroidism, unspecified: Secondary | ICD-10-CM | POA: Diagnosis present

## 2023-11-28 DIAGNOSIS — Z79899 Other long term (current) drug therapy: Secondary | ICD-10-CM

## 2023-11-28 DIAGNOSIS — Z9884 Bariatric surgery status: Secondary | ICD-10-CM

## 2023-11-28 DIAGNOSIS — Z8249 Family history of ischemic heart disease and other diseases of the circulatory system: Secondary | ICD-10-CM

## 2023-11-28 DIAGNOSIS — Z803 Family history of malignant neoplasm of breast: Secondary | ICD-10-CM

## 2023-11-28 DIAGNOSIS — Z85528 Personal history of other malignant neoplasm of kidney: Secondary | ICD-10-CM

## 2023-11-28 DIAGNOSIS — R5381 Other malaise: Secondary | ICD-10-CM | POA: Diagnosis present

## 2023-11-28 DIAGNOSIS — G4730 Sleep apnea, unspecified: Secondary | ICD-10-CM | POA: Diagnosis present

## 2023-11-28 DIAGNOSIS — Z79890 Hormone replacement therapy (postmenopausal): Secondary | ICD-10-CM

## 2023-11-28 DIAGNOSIS — Z818 Family history of other mental and behavioral disorders: Secondary | ICD-10-CM

## 2023-11-28 DIAGNOSIS — E785 Hyperlipidemia, unspecified: Secondary | ICD-10-CM | POA: Diagnosis present

## 2023-11-28 DIAGNOSIS — K219 Gastro-esophageal reflux disease without esophagitis: Secondary | ICD-10-CM | POA: Diagnosis present

## 2023-11-28 DIAGNOSIS — Z96641 Presence of right artificial hip joint: Secondary | ICD-10-CM | POA: Diagnosis present

## 2023-11-28 DIAGNOSIS — Z8262 Family history of osteoporosis: Secondary | ICD-10-CM

## 2023-11-28 DIAGNOSIS — Z87440 Personal history of urinary (tract) infections: Secondary | ICD-10-CM

## 2023-11-28 DIAGNOSIS — N390 Urinary tract infection, site not specified: Secondary | ICD-10-CM | POA: Diagnosis present

## 2023-11-28 DIAGNOSIS — Z888 Allergy status to other drugs, medicaments and biological substances status: Secondary | ICD-10-CM

## 2023-11-28 DIAGNOSIS — F0284 Dementia in other diseases classified elsewhere, unspecified severity, with anxiety: Secondary | ICD-10-CM | POA: Diagnosis present

## 2023-11-28 DIAGNOSIS — F0283 Dementia in other diseases classified elsewhere, unspecified severity, with mood disturbance: Secondary | ICD-10-CM | POA: Diagnosis present

## 2023-11-28 DIAGNOSIS — R5383 Other fatigue: Secondary | ICD-10-CM | POA: Diagnosis present

## 2023-11-28 DIAGNOSIS — Z9049 Acquired absence of other specified parts of digestive tract: Secondary | ICD-10-CM

## 2023-11-28 DIAGNOSIS — I10 Essential (primary) hypertension: Secondary | ICD-10-CM | POA: Diagnosis present

## 2023-11-28 DIAGNOSIS — Z885 Allergy status to narcotic agent status: Secondary | ICD-10-CM

## 2023-11-28 DIAGNOSIS — Z905 Acquired absence of kidney: Secondary | ICD-10-CM

## 2023-11-28 DIAGNOSIS — G20A1 Parkinson's disease without dyskinesia, without mention of fluctuations: Principal | ICD-10-CM | POA: Diagnosis present

## 2023-11-28 DIAGNOSIS — Z91048 Other nonmedicinal substance allergy status: Secondary | ICD-10-CM

## 2023-11-28 LAB — COMPREHENSIVE METABOLIC PANEL WITH GFR
ALT: 10 U/L (ref 0–44)
AST: 24 U/L (ref 15–41)
Albumin: 3.4 g/dL — ABNORMAL LOW (ref 3.5–5.0)
Alkaline Phosphatase: 95 U/L (ref 38–126)
Anion gap: 12 (ref 5–15)
BUN: 8 mg/dL (ref 8–23)
CO2: 23 mmol/L (ref 22–32)
Calcium: 9.4 mg/dL (ref 8.9–10.3)
Chloride: 102 mmol/L (ref 98–111)
Creatinine, Ser: 0.74 mg/dL (ref 0.44–1.00)
GFR, Estimated: >60 mL/min (ref >60)
Glucose, Bld: 89 mg/dL (ref 70–99)
Potassium: 3.5 mmol/L (ref 3.5–5.1)
Sodium: 137 mmol/L (ref 135–145)
Total Bilirubin: 1.3 mg/dL — ABNORMAL HIGH (ref 0.0–1.2)
Total Protein: 6.6 g/dL (ref 6.5–8.1)

## 2023-11-28 LAB — URINALYSIS, ROUTINE W REFLEX MICROSCOPIC
Bilirubin Urine: NEGATIVE
Glucose, UA: NEGATIVE mg/dL
Ketones, ur: 20 mg/dL — AB
Nitrite: NEGATIVE
Protein, ur: 30 mg/dL — AB
Specific Gravity, Urine: 1.021 (ref 1.005–1.030)
Squamous Epithelial / HPF: 0 /HPF (ref 0–5)
pH: 5 (ref 5.0–8.0)

## 2023-11-28 LAB — CBC
HCT: 42.3 % (ref 36.0–46.0)
Hemoglobin: 13.9 g/dL (ref 12.0–15.0)
MCH: 27.7 pg (ref 26.0–34.0)
MCHC: 32.9 g/dL (ref 30.0–36.0)
MCV: 84.3 fL (ref 80.0–100.0)
Platelets: 224 K/uL (ref 150–400)
RBC: 5.02 MIL/uL (ref 3.87–5.11)
RDW: 13.3 % (ref 11.5–15.5)
WBC: 5.1 K/uL (ref 4.0–10.5)
nRBC: 0 % (ref 0.0–0.2)

## 2023-11-28 MED ORDER — VANCOMYCIN HCL IN DEXTROSE 1-5 GM/200ML-% IV SOLN
1000.0000 mg | Freq: Once | INTRAVENOUS | Status: AC
  Administered 2023-11-29: 1000 mg via INTRAVENOUS
  Filled 2023-11-28: qty 200

## 2023-11-28 MED ORDER — LORAZEPAM 1 MG PO TABS
1.0000 mg | ORAL_TABLET | Freq: Once | ORAL | Status: AC
  Administered 2023-11-28: 1 mg via ORAL
  Filled 2023-11-28: qty 1

## 2023-11-28 MED ORDER — SODIUM CHLORIDE 0.9 % IV SOLN
1.0000 g | Freq: Once | INTRAVENOUS | Status: AC
  Administered 2023-11-29: 1 g via INTRAVENOUS
  Filled 2023-11-28: qty 10

## 2023-11-28 NOTE — Telephone Encounter (Signed)
 Copied from CRM #[id number]. Topic: Referral - Question >> Nov 28, 2023 10:54 AM Marylynn H wrote: Reason for CRM: Spoke with Mitzie - Nurse with [facility] who is calling requesting a hospice referral be faxed over for the patient. States patient has become bed bound & is barely eating and sleeping most of the day. Please advise # [phone] (vmail secured)   Fax # [phone]

## 2023-11-28 NOTE — ED Provider Notes (Signed)
 [facility] Provider Note    Event Date/Time   First MD Initiated Contact with Patient 11/28/23 2106     (approximate)   History   Chief Complaint Weakness   HPI  Brittany Benton is a 68 y.o. female with past medical history of hypertension, hyperlipidemia, Parkinson's disease, and hypothyroidism who presents to the ED complaining of weakness.  Husband at bedside reports that patient was recently started on antibiotics for a UTI while being seen for hallucinations here in the ED.  She has been taking Keflex  and Macrobid  due to Enterococcus in her urine, but he states that he has not seen any improvement in her condition.  She has not had any fevers, nausea, vomiting, abdominal pain, or flank pain.  She does continue to be generally weak with difficulty walking and hallucinations.     Physical Exam   Triage Vital Signs: ED Triage Vitals [11/28/23 1703]  Encounter Vitals Group     BP 131/81     Girls Systolic BP Percentile      Girls Diastolic BP Percentile      Boys Systolic BP Percentile      Boys Diastolic BP Percentile      Pulse Rate 99     Resp 17     Temp 97.6 F (36.4 C)     Temp Source Oral     SpO2 100 %     Weight 159 lb 13.3 oz (72.5 kg)     Height 5' (1.524 m)     Head Circumference      Peak Flow      Pain Score 0     Pain Loc      Pain Education      Exclude from Growth Chart     Most recent vital signs: Vitals:   11/28/23 1703 11/28/23 2129  BP: 131/81 122/62  Pulse: 99 96  Resp: 17 18  Temp: 97.6 F (36.4 C)   SpO2: 100% 100%    Constitutional: Alert and oriented to person and place, but not time or situation. Eyes: Conjunctivae are normal. Head: Atraumatic. Nose: No congestion/rhinnorhea. Mouth/Throat: Mucous membranes are moist.  Cardiovascular: Normal rate, regular rhythm. Grossly normal heart sounds.  2+ radial pulses bilaterally. Respiratory: Normal respiratory effort.  No retractions. Lungs  CTAB. Gastrointestinal: Soft and nontender. No distention. Musculoskeletal: No lower extremity tenderness nor edema.  Neurologic:  Normal speech and language. No gross focal neurologic deficits are appreciated.    ED Results / Procedures / Treatments   Labs (all labs ordered are listed, but only abnormal results are displayed) Labs Reviewed  COMPREHENSIVE METABOLIC PANEL WITH GFR - Abnormal; Notable for the following components:      Result Value   Albumin 3.4 (*)    Total Bilirubin 1.3 (*)    All other components within normal limits  URINALYSIS, ROUTINE W REFLEX MICROSCOPIC - Abnormal; Notable for the following components:   Color, Urine AMBER (*)    APPearance HAZY (*)    Hgb urine dipstick SMALL (*)    Ketones, ur 20 (*)    Protein, ur 30 (*)    Leukocytes,Ua SMALL (*)    Bacteria, UA RARE (*)    All other components within normal limits  URINE CULTURE  URINE CULTURE  CBC  CBC  CREATININE, SERUM  COMPREHENSIVE METABOLIC PANEL WITH GFR  CBC     EKG  ED ECG REPORT I, Carlin Palin, the attending physician, personally viewed and interpreted this  ECG.   Date: 11/28/2023  EKG Time: 22:57  Rate: 98  Rhythm: normal sinus rhythm  Axis: LAD  Intervals:none  ST&T Change: None  RADIOLOGY CT head reviewed and interpreted by me with no hemorrhage or midline shift.  PROCEDURES:  Critical Care performed: No  Procedures   MEDICATIONS ORDERED IN ED: Medications  cefTRIAXone  (ROCEPHIN ) 1 g in sodium chloride  0.9 % 100 mL IVPB (1 g Intravenous New Bag/Given 11/29/23 0103)  vancomycin  (VANCOCIN ) IVPB 1000 mg/200 mL premix (has no administration in time range)  heparin  injection 5,000 Units (has no administration in time range)  ondansetron  (ZOFRAN ) tablet 4 mg (has no administration in time range)    Or  ondansetron  (ZOFRAN ) injection 4 mg (has no administration in time range)  acetaminophen  (TYLENOL ) tablet 650 mg (has no administration in time range)    Or   acetaminophen  (TYLENOL ) suppository 650 mg (has no administration in time range)  albuterol  (PROVENTIL ) (2.5 MG/3ML) 0.083% nebulizer solution 2.5 mg (has no administration in time range)  LORazepam  (ATIVAN ) tablet 1 mg (1 mg Oral Given 11/28/23 2333)     IMPRESSION / MDM / ASSESSMENT AND PLAN / ED COURSE  I reviewed the triage vital signs and the nursing notes.                              68 y.o. female with past medical history of hypertension, hyperlipidemia, Parkinson's disease, and hypothyroidism who presents to the ED with ongoing generalized weakness and hallucinations following recent diagnosis of UTI.  Patient's presentation is most consistent with acute presentation with potential threat to life or bodily function.  Differential diagnosis includes, but is not limited to, sepsis, UTI, pyelonephritis, stroke, anemia, electrolyte abnormality, AKI.  Patient nontoxic-appearing and in no acute distress, vital signs are unremarkable and do not appear concerning for sepsis.  CT head is negative for acute process, labs without significant anemia, leukocytosis, electrolyte abnormality, or AKI.  LFTs are unremarkable, urinalysis does show ongoing UTI.  Recent culture grew Enterococcus and we will treat with IV Rocephin  and vancomycin .  Given altered mental status, case discussed with hospitalist for admission.      FINAL CLINICAL IMPRESSION(S) / ED DIAGNOSES   Final diagnoses:  Generalized weakness  Recurrent UTI     Rx / DC Orders   ED Discharge Orders     None        Note:  This document was prepared using Dragon voice recognition software and may include unintentional dictation errors.   Willo Dunnings, MD 11/29/23 [phone]
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
## 2023-11-28 NOTE — Patient Instructions (Signed)
 Visit Information  Thank you for taking time to visit with me today. Please don't hesitate to contact me if I can be of assistance to you before our next scheduled appointment.  Your next care management appointment is: will contact to schedule after ED visit    Please call the care guide team at [phone] if you need to cancel, schedule, or reschedule an appointment.   Please call the Suicide and Crisis Lifeline: 988 call the [place]  National Suicide Prevention Lifeline: (269) 748-9949 or TTY: 9070518417 TTY (863)381-6480) to talk to a trained counselor call 1-800-273-TALK (toll free, 24 hour hotline) go to [facility] [address] [place] [phone]) call 911 if you are experiencing a Mental Health or Behavioral Health Crisis or need someone to talk to.  Nestora Duos, MSN, RN [facility], [facility] RN Care Manager Direct Dial: [phone] Fax: [phone]
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
## 2023-11-29 ENCOUNTER — Telehealth: Admitting: Family Medicine

## 2023-11-29 DIAGNOSIS — K219 Gastro-esophageal reflux disease without esophagitis: Secondary | ICD-10-CM | POA: Diagnosis present

## 2023-11-29 DIAGNOSIS — N390 Urinary tract infection, site not specified: Secondary | ICD-10-CM | POA: Diagnosis present

## 2023-11-29 DIAGNOSIS — Z905 Acquired absence of kidney: Secondary | ICD-10-CM | POA: Diagnosis not present

## 2023-11-29 DIAGNOSIS — F22 Delusional disorders: Secondary | ICD-10-CM | POA: Diagnosis present

## 2023-11-29 DIAGNOSIS — Z9049 Acquired absence of other specified parts of digestive tract: Secondary | ICD-10-CM | POA: Diagnosis not present

## 2023-11-29 DIAGNOSIS — G2581 Restless legs syndrome: Secondary | ICD-10-CM | POA: Diagnosis not present

## 2023-11-29 DIAGNOSIS — Z85528 Personal history of other malignant neoplasm of kidney: Secondary | ICD-10-CM | POA: Diagnosis not present

## 2023-11-29 DIAGNOSIS — Z96641 Presence of right artificial hip joint: Secondary | ICD-10-CM | POA: Diagnosis present

## 2023-11-29 DIAGNOSIS — Z885 Allergy status to narcotic agent status: Secondary | ICD-10-CM | POA: Diagnosis not present

## 2023-11-29 DIAGNOSIS — M6259 Muscle wasting and atrophy, not elsewhere classified, multiple sites: Secondary | ICD-10-CM | POA: Diagnosis not present

## 2023-11-29 DIAGNOSIS — I10 Essential (primary) hypertension: Secondary | ICD-10-CM | POA: Diagnosis not present

## 2023-11-29 DIAGNOSIS — Z8262 Family history of osteoporosis: Secondary | ICD-10-CM | POA: Diagnosis not present

## 2023-11-29 DIAGNOSIS — E785 Hyperlipidemia, unspecified: Secondary | ICD-10-CM | POA: Diagnosis present

## 2023-11-29 DIAGNOSIS — R5381 Other malaise: Secondary | ICD-10-CM | POA: Diagnosis not present

## 2023-11-29 DIAGNOSIS — E039 Hypothyroidism, unspecified: Secondary | ICD-10-CM | POA: Diagnosis not present

## 2023-11-29 DIAGNOSIS — F419 Anxiety disorder, unspecified: Secondary | ICD-10-CM | POA: Diagnosis present

## 2023-11-29 DIAGNOSIS — Z888 Allergy status to other drugs, medicaments and biological substances status: Secondary | ICD-10-CM | POA: Diagnosis not present

## 2023-11-29 DIAGNOSIS — Z741 Need for assistance with personal care: Secondary | ICD-10-CM | POA: Diagnosis not present

## 2023-11-29 DIAGNOSIS — E876 Hypokalemia: Secondary | ICD-10-CM | POA: Diagnosis not present

## 2023-11-29 DIAGNOSIS — R531 Weakness: Secondary | ICD-10-CM | POA: Diagnosis not present

## 2023-11-29 DIAGNOSIS — Z91048 Other nonmedicinal substance allergy status: Secondary | ICD-10-CM | POA: Diagnosis not present

## 2023-11-29 DIAGNOSIS — F0283 Dementia in other diseases classified elsewhere, unspecified severity, with mood disturbance: Secondary | ICD-10-CM | POA: Diagnosis present

## 2023-11-29 DIAGNOSIS — Z79890 Hormone replacement therapy (postmenopausal): Secondary | ICD-10-CM | POA: Diagnosis not present

## 2023-11-29 DIAGNOSIS — Z8249 Family history of ischemic heart disease and other diseases of the circulatory system: Secondary | ICD-10-CM | POA: Diagnosis not present

## 2023-11-29 DIAGNOSIS — F0284 Dementia in other diseases classified elsewhere, unspecified severity, with anxiety: Secondary | ICD-10-CM | POA: Diagnosis present

## 2023-11-29 DIAGNOSIS — Z7401 Bed confinement status: Secondary | ICD-10-CM | POA: Diagnosis not present

## 2023-11-29 DIAGNOSIS — G20A1 Parkinson's disease without dyskinesia, without mention of fluctuations: Secondary | ICD-10-CM | POA: Diagnosis not present

## 2023-11-29 DIAGNOSIS — Z79899 Other long term (current) drug therapy: Secondary | ICD-10-CM | POA: Diagnosis not present

## 2023-11-29 DIAGNOSIS — R4789 Other speech disturbances: Secondary | ICD-10-CM | POA: Diagnosis not present

## 2023-11-29 DIAGNOSIS — Z9884 Bariatric surgery status: Secondary | ICD-10-CM | POA: Diagnosis not present

## 2023-11-29 DIAGNOSIS — R2681 Unsteadiness on feet: Secondary | ICD-10-CM | POA: Diagnosis not present

## 2023-11-29 DIAGNOSIS — F0282 Dementia in other diseases classified elsewhere, unspecified severity, with psychotic disturbance: Secondary | ICD-10-CM | POA: Diagnosis present

## 2023-11-29 DIAGNOSIS — G9341 Metabolic encephalopathy: Secondary | ICD-10-CM

## 2023-11-29 DIAGNOSIS — E7849 Other hyperlipidemia: Secondary | ICD-10-CM | POA: Diagnosis not present

## 2023-11-29 LAB — COMPREHENSIVE METABOLIC PANEL WITH GFR
ALT: 12 U/L (ref 0–44)
AST: 22 U/L (ref 15–41)
Albumin: 3.1 g/dL — ABNORMAL LOW (ref 3.5–5.0)
Alkaline Phosphatase: 91 U/L (ref 38–126)
Anion gap: 11 (ref 5–15)
BUN: 8 mg/dL (ref 8–23)
CO2: 25 mmol/L (ref 22–32)
Calcium: 8.9 mg/dL (ref 8.9–10.3)
Chloride: 104 mmol/L (ref 98–111)
Creatinine, Ser: 0.89 mg/dL (ref 0.44–1.00)
GFR, Estimated: >60 mL/min (ref >60)
Glucose, Bld: 109 mg/dL — ABNORMAL HIGH (ref 70–99)
Potassium: 2.7 mmol/L — CL (ref 3.5–5.1)
Sodium: 140 mmol/L (ref 135–145)
Total Bilirubin: 1.1 mg/dL (ref 0.0–1.2)
Total Protein: 6.2 g/dL — ABNORMAL LOW (ref 6.5–8.1)

## 2023-11-29 LAB — CBC
HCT: 40.9 % (ref 36.0–46.0)
Hemoglobin: 13.2 g/dL (ref 12.0–15.0)
MCH: 27.4 pg (ref 26.0–34.0)
MCHC: 32.3 g/dL (ref 30.0–36.0)
MCV: 84.9 fL (ref 80.0–100.0)
Platelets: 207 K/uL (ref 150–400)
RBC: 4.82 MIL/uL (ref 3.87–5.11)
RDW: 13.2 % (ref 11.5–15.5)
WBC: 5.9 K/uL (ref 4.0–10.5)
nRBC: 0 % (ref 0.0–0.2)

## 2023-11-29 MED ORDER — ONDANSETRON HCL 4 MG/2ML IJ SOLN: 4.0000 mg | Freq: Four times a day (QID) | INTRAMUSCULAR | Status: DC | PRN

## 2023-11-29 MED ORDER — POTASSIUM CHLORIDE 10 MEQ/100ML IV SOLN
10.0000 meq | INTRAVENOUS | Status: AC
  Administered 2023-11-29 (×2): 10 meq via INTRAVENOUS
  Filled 2023-11-29: qty 100

## 2023-11-29 MED ORDER — CLONAZEPAM 0.5 MG PO TABS
0.5000 mg | ORAL_TABLET | Freq: Two times a day (BID) | ORAL | Status: DC
  Administered 2023-11-29: 0.5 mg via ORAL
  Filled 2023-11-29: qty 1

## 2023-11-29 MED ORDER — ALBUTEROL SULFATE (2.5 MG/3ML) 0.083% IN NEBU: 2.5000 mg | INHALATION_SOLUTION | RESPIRATORY_TRACT | Status: DC | PRN

## 2023-11-29 MED ORDER — LEVOTHYROXINE SODIUM 88 MCG PO TABS
44.0000 ug | ORAL_TABLET | Freq: Every day | ORAL | Status: DC
  Administered 2023-11-29 – 2023-12-03 (×5): 44 ug via ORAL
  Filled 2023-11-29 (×5): qty 0.5

## 2023-11-29 MED ORDER — SERTRALINE HCL 50 MG PO TABS
100.0000 mg | ORAL_TABLET | Freq: Every day | ORAL | Status: DC
  Administered 2023-11-29 – 2023-12-03 (×5): 100 mg via ORAL
  Filled 2023-11-29 (×5): qty 2

## 2023-11-29 MED ORDER — ADULT MULTIVITAMIN W/MINERALS CH
1.0000 | ORAL_TABLET | Freq: Two times a day (BID) | ORAL | Status: DC
  Administered 2023-11-29 – 2023-12-03 (×8): 1 via ORAL
  Filled 2023-11-29 (×8): qty 1

## 2023-11-29 MED ORDER — POTASSIUM CHLORIDE 10 MEQ/100ML IV SOLN
10.0000 meq | INTRAVENOUS | Status: DC
  Administered 2023-11-29 (×2): 10 meq via INTRAVENOUS
  Filled 2023-11-29 (×3): qty 100

## 2023-11-29 MED ORDER — ENOXAPARIN SODIUM 40 MG/0.4ML IJ SOSY
40.0000 mg | PREFILLED_SYRINGE | INTRAMUSCULAR | Status: DC
  Administered 2023-11-29 – 2023-12-02 (×4): 40 mg via SUBCUTANEOUS
  Filled 2023-11-29 (×4): qty 0.4

## 2023-11-29 MED ORDER — ONDANSETRON HCL 4 MG PO TABS: 4.0000 mg | ORAL_TABLET | Freq: Four times a day (QID) | ORAL | Status: DC | PRN

## 2023-11-29 MED ORDER — ATORVASTATIN CALCIUM 20 MG PO TABS
20.0000 mg | ORAL_TABLET | Freq: Every evening | ORAL | Status: DC
  Administered 2023-11-29 – 2023-12-02 (×4): 20 mg via ORAL
  Filled 2023-11-29 (×4): qty 1

## 2023-11-29 MED ORDER — ENSURE PLUS HIGH PROTEIN PO LIQD
237.0000 mL | Freq: Two times a day (BID) | ORAL | Status: DC
  Administered 2023-11-29: 237 mL via ORAL

## 2023-11-29 MED ORDER — CALCIUM CARBONATE ANTACID 500 MG PO CHEW
1.0000 | CHEWABLE_TABLET | Freq: Three times a day (TID) | ORAL | Status: DC
  Administered 2023-11-29 – 2023-12-03 (×12): 200 mg via ORAL
  Filled 2023-11-29 (×11): qty 1

## 2023-11-29 MED ORDER — BOOST / RESOURCE BREEZE PO LIQD CUSTOM
1.0000 | Freq: Three times a day (TID) | ORAL | Status: DC
  Administered 2023-11-29 – 2023-12-03 (×8): 1 via ORAL

## 2023-11-29 MED ORDER — POTASSIUM CHLORIDE CRYS ER 20 MEQ PO TBCR
40.0000 meq | EXTENDED_RELEASE_TABLET | Freq: Once | ORAL | Status: AC
Start: 2023-11-29 — End: 2023-11-29
  Administered 2023-11-29: 40 meq via ORAL
  Filled 2023-11-29: qty 2

## 2023-11-29 MED ORDER — HEPARIN SODIUM (PORCINE) 5000 UNIT/ML IJ SOLN
5000.0000 [IU] | Freq: Three times a day (TID) | INTRAMUSCULAR | Status: DC
Start: 2023-11-29 — End: 2023-11-29
  Administered 2023-11-29 (×2): 5000 [IU] via SUBCUTANEOUS
  Filled 2023-11-29 (×2): qty 1

## 2023-11-29 MED ORDER — CARBIDOPA-LEVODOPA ER 25-100 MG PO TBCR
1.0000 | EXTENDED_RELEASE_TABLET | Freq: Two times a day (BID) | ORAL | Status: DC
  Administered 2023-11-29 – 2023-12-03 (×9): 1 via ORAL
  Filled 2023-11-29 (×11): qty 1

## 2023-11-29 MED ORDER — HALOPERIDOL LACTATE 5 MG/ML IJ SOLN
2.0000 mg | Freq: Four times a day (QID) | INTRAMUSCULAR | Status: DC | PRN
  Administered 2023-11-29 – 2023-12-02 (×4): 2 mg via INTRAVENOUS
  Filled 2023-11-29 (×4): qty 1

## 2023-11-29 MED ORDER — ACETAMINOPHEN 325 MG PO TABS: 650.0000 mg | ORAL_TABLET | Freq: Four times a day (QID) | ORAL | Status: DC | PRN

## 2023-11-29 MED ORDER — ACETAMINOPHEN 650 MG RE SUPP: 650.0000 mg | Freq: Four times a day (QID) | RECTAL | Status: DC | PRN

## 2023-11-29 MED ORDER — FAMOTIDINE 20 MG PO TABS
20.0000 mg | ORAL_TABLET | Freq: Every evening | ORAL | Status: DC
  Administered 2023-11-29 – 2023-12-02 (×4): 20 mg via ORAL
  Filled 2023-11-29 (×4): qty 1

## 2023-11-29 NOTE — Plan of Care (Signed)
  Problem: Education: Goal: Knowledge of General Education information will improve Description: Including pain rating scale, medication(s)/side effects and non-pharmacologic comfort measures Outcome: Progressing   Problem: Health Behavior/Discharge Planning: Goal: Ability to manage health-related needs will improve Outcome: Progressing   Problem: Activity: Goal: Risk for activity intolerance will decrease Outcome: Progressing   Problem: Nutrition: Goal: Adequate nutrition will be maintained Outcome: Progressing   Problem: Coping: Goal: Level of anxiety will decrease Outcome: Progressing   Problem: Elimination: Goal: Will not experience complications related to bowel motility Outcome: Progressing Goal: Will not experience complications related to urinary retention Outcome: Progressing   Problem: Pain Managment: Goal: General experience of comfort will improve and/or be controlled Outcome: Progressing   Problem: Skin Integrity: Goal: Risk for impaired skin integrity will decrease Outcome: Progressing

## 2023-11-29 NOTE — Telephone Encounter (Signed)
 Patient admitted to [facility].

## 2023-11-29 NOTE — H&P (Addendum)
 History and Physical    Brittany Benton FMW:[id number] DOB: 1955/12/31 DOA: 11/28/2023  PCP: [name], MD  Patient coming from: home  I have personally briefly reviewed patient's old medical records in [facility] Link  Chief Complaint: change in mental status with hallucinations  HPI: Brittany Benton is a 68 y.o. female with medical history significant of  Parkinson's disease , HTN and hypothyroidism, recurrent UTIs, who presents with change in mental status hallucinations and generalized weakness symptoms that typically occur when patient has a UTI. Patient is alert and oriented and no current complaints. Per husband change in mental status and weakness ongoing x few days.    ED Course:  Vitals: afeb, bp 131/81, hr 99, rr 17, sat 100%  Wbc 5.1, hgb 13.9, plt 224 Na 137, K 3.5, Cl 102, bicarb 23, glucose 89,  CR 0.74,  CTH  NAD UA: wbc 21-50, rare bacteria , = LE EKG: NSR q in inferior leads  unchanged from prior  Tx lorazepam   Review of Systems: As per HPI otherwise 10 point review of systems negative.   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   GERD (gastroesophageal reflux disease)    Hernia, incisional    after renal surgery   History of chicken pox    Hyperlipidemia    Hypertension    Parkinson disease (HCC)    Renal cell carcinoma (HCC)    s/p right nephrectomy 1994   Sleep apnea    Thyroid  disease     Past Surgical History:  Procedure Laterality Date   APPENDECTOMY  1994   BIOPSY  11/07/2022   Procedure: BIOPSY;  Surgeon: Therisa Bi, MD;  Location: [facility] ENDOSCOPY;  Service: Gastroenterology;;   BREAST BIOPSY Right 1991   Negative   CHOLECYSTECTOMY  1994   COLONOSCOPY WITH PROPOFOL  N/A 11/07/2022   Procedure: COLONOSCOPY WITH PROPOFOL ;  Surgeon: Therisa Bi, MD;   Location: [facility] ENDOSCOPY;  Service: Gastroenterology;  Laterality: N/A;   ESOPHAGOGASTRODUODENOSCOPY (EGD) WITH PROPOFOL  N/A 11/07/2022   Procedure: ESOPHAGOGASTRODUODENOSCOPY (EGD) WITH PROPOFOL ;  Surgeon: Therisa Bi, MD;  Location: [facility] ENDOSCOPY;  Service: Gastroenterology;  Laterality: N/A;   JOINT REPLACEMENT  2019   LAPAROSCOPIC GASTRIC SLEEVE RESECTION  02/02/2016   Dr Thedora at [facility]   NEPHRECTOMY  1994   Renal Cell Carcinoma   parotid gland removal  1990   also removed a tumor   POLYPECTOMY  11/07/2022   Procedure: POLYPECTOMY INTESTINAL;  Surgeon: Therisa Bi, MD;  Location: [facility] ENDOSCOPY;  Service: Gastroenterology;;   TONSILLECTOMY     TOTAL HIP ARTHROPLASTY Right 08/08/2016   Procedure: TOTAL HIP ARTHROPLASTY;  Surgeon: Kayla Pinal, MD;  Location: [facility] ORS;  Service: Orthopedics;  Laterality: Right;   TUBAL LIGATION  1979     reports that she has never smoked. She has never used smokeless tobacco. She reports that she does not currently use alcohol. She reports that she does not use drugs.  Allergies  Allergen Reactions   Codeine Nausea And Vomiting and Other (See Comments)   Morphine Hives, Swelling and Other (See Comments)   Tape Other (See Comments)    blisters blisters   Tramadol  Other (See Comments)    Hallucinations    Family History  Problem Relation  Age of Onset   Osteoporosis Mother    Dementia Mother    Anxiety disorder Mother    Heart disease Father    Heart attack Father    Breast cancer Maternal Aunt    Obesity Maternal Uncle     Prior to Admission medications   Medication Sig Start Date End Date Taking? Authorizing Provider  Ascorbic Acid (VITAMIN C PO) Take 1 tablet by mouth daily. Patient not taking: Reported on 11/28/2023    [provider]  atorvastatin  (LIPITOR) 20 MG tablet TAKE 1 TABLET BY MOUTH EVERY EVENING 09/07/23   Gasper Nancyann BRAVO, MD  Carbidopa -Levodopa  ER (SINEMET  CR) 25-100 MG tablet controlled release Take 1  tablet by mouth 2 (two) times daily.    [name], MD  Carbidopa -Levodopa  ER (SINEMET  CR) 25-100 MG tablet controlled release Take 2 tablets by mouth. Patient not taking: Reported on 11/28/2023 10/23/23 01/21/24  [provider]  Cholecalciferol  (VITAMIN D -3) 1000 units CAPS Take 1 capsule by mouth daily.    [provider]  famotidine  (PEPCID ) 20 MG tablet Take 1 tablet (20 mg total) by mouth every evening. For acid reflux 11/04/23   Gasper Nancyann BRAVO, MD  imipramine  (TOFRANIL ) 50 MG tablet TAKE ONE TABLET BY MOUTH TWICE DAILY 01/22/23   Gasper Nancyann BRAVO, MD  ketoconazole  (NIZORAL ) 2 % cream Apply 1 Application topically daily. Until rash resolves completely. 11/04/23   Gasper Nancyann BRAVO, MD  KLONOPIN  0.5 MG tablet Take 0.5 mg by mouth 2 (two) times daily. 10/30/23   [provider]  levothyroxine  (SYNTHROID ) 88 MCG tablet Take 0.5 tablets (44 mcg total) by mouth daily. 08/27/23   Donzella [name], DO  metoprolol  succinate (TOPROL -XL) 50 MG 24 hr tablet TAKE ONE TABLET BY MOUTH ONCE DAILY WITHOR IMMEDIATELY FOLLOWING A MEAL Patient not taking: Reported on 11/28/2023 11/27/23   Gasper Nancyann BRAVO, MD  midodrine  (PROAMATINE ) 5 MG tablet Take 1 tablet (5 mg total) by mouth 3 (three) times daily with meals. Hold if SBP > 120 mmHg and/or HR <65 Patient not taking: Reported on 11/28/2023 10/09/23   Von Bellis, MD  Multiple Vitamin (MULTIVITAMIN WITH MINERALS) TABS tablet Take 1 tablet by mouth 2 (two) times daily. Patient not taking: Reported on 11/28/2023 10/09/23   Von Bellis, MD  nitrofurantoin , macrocrystal-monohydrate, (MACROBID ) 100 MG capsule Take 1 capsule (100 mg total) by mouth 2 (two) times daily for 4 days. 11/25/23 11/29/23  Dicky Anes, MD  omeprazole  (PRILOSEC) 20 MG capsule TAKE 1 CAPSULE BY MOUTH ONCE DAILY 09/16/23   Gasper Nancyann BRAVO, MD  sertraline  (ZOLOFT ) 100 MG tablet TAKE ONE TABLET BY MOUTH DAILY 08/27/23   Gasper Nancyann BRAVO, MD    Physical Exam: Vitals:    11/28/23 1703 11/28/23 2129  BP: 131/81 122/62  Pulse: 99 96  Resp: 17 18  Temp: 97.6 F (36.4 C)   TempSrc: Oral   SpO2: 100% 100%  Weight: 72.5 kg   Height: 5' (1.524 m)     Constitutional: NAD, calm, comfortable Vitals:   11/28/23 1703 11/28/23 2129  BP: 131/81 122/62  Pulse: 99 96  Resp: 17 18  Temp: 97.6 F (36.4 C)   TempSrc: Oral   SpO2: 100% 100%  Weight: 72.5 kg   Height: 5' (1.524 m)    Eyes: lids and conjunctivae normal ENMT: Mucous membranes are moist.  Neck: normal, supple, no masses, no thyromegaly Respiratory: clear to auscultation bilaterally, no wheezing, no crackles. Normal respiratory effort. No accessory muscle use.  Cardiovascular: Regular rate and rhythm, no murmurs / rubs / gallops. No extremity edema. 2+ pedal pulses  Abdomen: no tenderness, no masses palpated. No hepatosplenomegaly. Bowel sounds positive.  Musculoskeletal: no clubbing / cyanosis. No joint deformity upper and lower extremities. Good ROM, no contractures. Normal muscle tone.  Skin: no rashes, lesions, ulcers. No induration Neurologic: CN grossly intact. Sensation intact, Strength 5/5 in all 4.  Psychiatric: alert and oriented x 3  Labs on Admission: I have personally reviewed following labs and imaging studies  CBC: Recent Labs  Lab 11/28/23 1705  WBC 5.1  HGB 13.9  HCT 42.3  MCV 84.3  PLT 224   Basic Metabolic Panel: Recent Labs  Lab 11/28/23 1705  NA 137  K 3.5  CL 102  CO2 23  GLUCOSE 89  BUN 8  CREATININE 0.74  CALCIUM  9.4   GFR: Estimated Creatinine Clearance: 58.2 mL/min (by C-G formula based on SCr of 0.74 mg/dL). Liver Function Tests: Recent Labs  Lab 11/28/23 1705  AST 24  ALT 10  ALKPHOS 95  BILITOT 1.3*  PROT 6.6  ALBUMIN 3.4*   No results for input(s): LIPASE, AMYLASE in the last 168 hours. No results for input(s): AMMONIA in the last 168 hours. Coagulation Profile: No results for input(s): INR, PROTIME in the last 168  hours. Cardiac Enzymes: No results for input(s): CKTOTAL, CKMB, CKMBINDEX, TROPONINI in the last 168 hours. BNP (last 3 results) No results for input(s): PROBNP in the last 8760 hours. HbA1C: No results for input(s): HGBA1C in the last 72 hours. CBG: No results for input(s): GLUCAP in the last 168 hours. Lipid Profile: No results for input(s): CHOL, HDL, LDLCALC, TRIG, CHOLHDL, LDLDIRECT in the last 72 hours. Thyroid  Function Tests: No results for input(s): TSH, T4TOTAL, FREET4, T3FREE, THYROIDAB in the last 72 hours. Anemia Panel: No results for input(s): VITAMINB12, FOLATE, FERRITIN, TIBC, IRON, RETICCTPCT in the last 72 hours. Urine analysis:    Component Value Date/Time   COLORURINE AMBER (A) 11/28/2023 2221   APPEARANCEUR HAZY (A) 11/28/2023 2221   LABSPEC 1.021 11/28/2023 2221   PHURINE 5.0 11/28/2023 2221   GLUCOSEU NEGATIVE 11/28/2023 2221   HGBUR SMALL (A) 11/28/2023 2221   BILIRUBINUR NEGATIVE 11/28/2023 2221   BILIRUBINUR Negative 11/15/2023 1149   KETONESUR 20 (A) 11/28/2023 2221   PROTEINUR 30 (A) 11/28/2023 2221   UROBILINOGEN 0.2 11/15/2023 1149   NITRITE NEGATIVE 11/28/2023 2221   LEUKOCYTESUR SMALL (A) 11/28/2023 2221    Radiological Exams on Admission: CT Head Wo Contrast Result Date: 11/28/2023 CLINICAL DATA:  Weakness and altered mental status EXAM: CT HEAD WITHOUT CONTRAST TECHNIQUE: Contiguous axial images were obtained from the base of the skull through the vertex without intravenous contrast. RADIATION DOSE REDUCTION: This exam was performed according to the departmental dose-optimization program which includes automated exposure control, adjustment of the mA and/or kV according to patient size and/or use of iterative reconstruction technique. COMPARISON:  11/17/2023 FINDINGS: Brain: No evidence of acute infarction, hemorrhage, hydrocephalus, extra-axial collection or mass lesion/mass effect. Mild atrophic  changes are again seen. Mild chronic white matter ischemic changes noted as well. Vascular: No hyperdense vessel or unexpected calcification. Skull: Normal. Negative for fracture or focal lesion. Sinuses/Orbits: No acute finding. Other: None. IMPRESSION: Chronic atrophic and ischemic changes without acute abnormality. Electronically Signed   By: Oneil Devonshire M.D.   On: 11/28/2023 22:33    EKG: Independently reviewed.   Assessment/Plan  UTI  -hx of recurrent uti - last culture enterococcus faecalis sensitive  to vanc  -agree with vanc and Ctx while awaiting culture data   Acute metabolic encephalopathy  -with atypical hallucinations  -due to acute infection  - previous admission seen by psych and hallucinations thought to be related to acute metabolic encephalopathy  -continue to monitor symptoms     Parkinson's disease  -progressive debility  - will need PT/OT  -continue sinemet  - possible evaluation for long term placement   Anxiety/Depression -continue ssri, klonopin   HTN -continue treatment    Hypothyroidism -continue synthroid    GERD -ppi DVT prophylaxis: scd Code Status: full/ as discussed per patient wishes in event of cardiac arrest  Family Communication: husband at bedsideBell,[name] M (Spouse) [phone] ([place])  Disposition Plan: patient  expected to be admitted greater than 2 midnights  Consults called: n/a Admission status:  med tele   Brittany DELENA Ned MD [facility]  If 7PM-7AM, please contact night-coverage [email or web address] Password [facility]  11/29/2023, 12:21 AM

## 2023-11-29 NOTE — Care Management Important Message (Signed)
 Important Message  Patient Details  Name: Brittany Benton MRN: [id number] Date of Birth: [birth year]   Important Message Given:  Yes - Medicare IM     [name] 11/29/2023, 2:32 PM

## 2023-11-29 NOTE — Progress Notes (Signed)
  PROGRESS NOTE    Brittany Benton  FMW:[id number] DOB: [birth year] DOA: 11/28/2023 PCP: [name], MD  [id number]  LOS: 0 days   Brief hospital course:   Assessment & Plan: Brittany Benton is a 68 y.o. female with medical history significant of  Parkinson's disease , HTN and hypothyroidism, recurrent UTIs, who presents presented from home due to weakness.  Husband thinks that she has a UTI    UTI, ruled out --hold further abx   Acute metabolic encephalopathy, ruled out Likely dementia --history more consistent with paranoia than hallucinations.  Pt was seen by psych during previous admission for similar.  Parkinson's disease  progressive debility  -continue sinemet  --husband unable to take care of pt and brought pt to the ED for facility placement.  Anxiety/Depression --d/c home benzo as that may worsen mental status --cont sertraline     Hypothyroidism -continue synthroid     Hypokalemia --supplement PRN   DVT prophylaxis: Lovenox  SQ Code Status: Full code  Family Communication:  Level of care: Telemetry Medical Dispo:   The patient is from: home Anticipated d/c is to: to be determined Anticipated d/c date is: whenever disposition found   Subjective and Interval History:  Pt was confused, not able to provide history.   Objective: Vitals:   11/28/23 2129 11/29/23 0216 11/29/23 0806 11/29/23 1525  BP: 122/62 123/79 114/66 118/74  Pulse: 96 96 92 84  Resp: 18 18 17 16   Temp:  97.6 F (36.4 C) (!) 97.5 F (36.4 C) 97.8 F (36.6 C)  TempSrc:  Oral Oral Axillary  SpO2: 100% 100% 99% 100%  Weight:      Height:        Intake/Output Summary (Last 24 hours) at 11/29/2023 1916 Last data filed at 11/29/2023 1500 Gross per 24 hour  Intake 482.63 ml  Output --  Net 482.63 ml   Filed Weights   11/28/23 1703  Weight: 72.5 kg    Examination:   Constitutional: NAD, alert, not oriented HEENT: conjunctivae and lids normal, EOMI CV: No cyanosis.   RESP:  normal respiratory effort, on RA Neuro: II - XII grossly intact.     Data Reviewed: I have personally reviewed labs and imaging studies  No charge note.  Ellouise Haber, MD [facility] If 7PM-7AM, please contact night-coverage 11/29/2023, 7:16 PM

## 2023-11-29 NOTE — Progress Notes (Signed)
   11/29/23 1100  Spiritual Encounters  Type of Visit Initial  Care provided to: Patient  Conversation partners present during encounter Nurse  Referral source Nurse (RN/NT/LPN)  Reason for visit Advance directives  OnCall Visit Yes   Chaplain visited patient because there was a [employer] entered in the system for an AD.  Chaplain entered patient's room and patient asked for her sister who she was seeing out in the hall.  This didn't provide the Chaplain with an assurance that the patient could receive the information about the AD or sign the form.  Patient also shared that she wanted to take something off that was on her under the blanket.  Chaplain shared this with staff.  Nurse said she'd send someone in to the patient.    Rev. Rana M. Nicholaus, M.Div. Chaplain [facility]ident [facility]

## 2023-11-29 NOTE — Progress Notes (Signed)
 [facility] ([facility]) Liaison Note  Ms. Brittany Benton is a current [facility] patient followed by our outpatient palliative medicine team.  The palliative team made a hospice referral for Ms. Brittany Benton yesterday and we were awaiting MD/PCP order.  Notified Brittany Benton and Brittany Benton, TOC.  Hospital liaison team will follow through hospitalization.  Thank you, Saddie HILARIO Na, RN Nurse Liaison [phone]

## 2023-11-29 NOTE — TOC Initial Note (Signed)
 Transition of Care [facility]) - Initial/Assessment Note    Patient Details  Name: Brittany Benton MRN: [id number] Date of Birth: 07-09-1955  [facility] CM/SW Contact:    [place] L Corrissa Martello, LCSW Phone Number: 11/29/2023, 11:30 AM  Clinical Narrative:                  CSW met with patient at bedside. Patient presented reasonably well. Patient advised that she has HH services in place and has an aide that helps her two to three times a week. She reported that she wanted to stay home.   TOC awaiting PT/OT disposition. Patient is a readmit. Spouse previously advised that he was having difficulty caring for his wife at home.   Readmission screen completed.         Patient Goals and CMS Choice            Expected Discharge Plan and Services                                              Prior Living Arrangements/Services                       Activities of Daily Living   ADL Screening (condition at time of admission) Independently performs ADLs?: No Does the patient have a NEW difficulty with bathing/dressing/toileting/self-feeding that is expected to last >3 days?: No Does the patient have a NEW difficulty with getting in/out of bed, walking, or climbing stairs that is expected to last >3 days?: No Does the patient have a NEW difficulty with communication that is expected to last >3 days?: No Is the patient deaf or have difficulty hearing?: No Does the patient have difficulty seeing, even when wearing glasses/contacts?: No Does the patient have difficulty concentrating, remembering, or making decisions?: Yes  Permission Sought/Granted                  Emotional Assessment              Admission diagnosis:  UTI (urinary tract infection) [N39.0] Recurrent UTI [N39.0] Generalized weakness [R53.1] Patient Active Problem List   Diagnosis Date Noted   UTI (urinary tract infection) 11/29/2023   Confusion 11/19/2023   Generalized weakness  10/06/2023   Urinary tract infection 10/06/2023   Hypotension 10/06/2023   Weakness generalized 10/06/2023   Depression    B12 deficiency 06/11/2022   Iron deficiency anemia 04/10/2022   Morbid obesity (HCC) 03/30/2022   Vitamin A  deficiency 08/15/2021   Parkinson's disease (HCC) 08/03/2021   Acute urinary retention 08/03/2021   Meningioma (HCC) 04/05/2021   Hypothyroidism 08/15/2020   Osteopenia 06/14/2020   Status post total hip replacement, 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   Osteoarthritis of right hip 03/26/2016   H/O gastric bypass 03/26/2016   Sinus tachycardia 08/01/2015   Obesity (BMI 30-39.9) 06/08/2015   OSA (obstructive sleep apnea) 06/08/2015   GERD without esophagitis 06/08/2015   Incisional hernia 06/08/2015   Arthritis of hip 03/23/2015   Left knee pain 02/15/2015   Vitamin D  deficiency 09/29/2014   Lump of right breast 09/22/2014   Prediabetes 09/22/2014   Pruritic dermatitis 09/22/2014   Obstructive sleep apnea 09/22/2014   Allergic rhinitis 09/15/2009   Hypersomnia 09/14/2009   Panic disorder 08/06/2008   Essential (primary) hypertension 07/16/2007   Hyperlipidemia 07/16/2007   PCP:  [name],  MD Pharmacy:   [facility] - [place], [place] - [address] [place] [address] Phone: [phone] Fax: [phone]     Social Drivers of Health (SDOH) Social History: SDOH Screenings   Food Insecurity: No Food Insecurity (11/29/2023)  Housing: Low Risk  (11/29/2023)  Transportation Needs: No Transportation Needs (11/29/2023)  Utilities: Not At Risk (11/29/2023)  Alcohol Screen: Low Risk  (03/30/2022)  Depression (PHQ2-9): Low Risk  (11/04/2023)  Recent Concern: Depression (PHQ2-9) - High Risk (09/12/2023)  Financial Resource Strain: Low Risk  (09/20/2023)  Physical Activity: Insufficiently Active (11/14/2022)  Social Connections: Socially Isolated (11/29/2023)  Stress: No Stress Concern Present (11/14/2022)  Tobacco Use: Low Risk   (11/28/2023)  Health Literacy: Adequate Health Literacy (11/14/2022)   SDOH Interventions:     Readmission Risk Interventions     No data to display

## 2023-11-30 DIAGNOSIS — R5381 Other malaise: Secondary | ICD-10-CM | POA: Diagnosis not present

## 2023-11-30 LAB — URINE CULTURE: Culture: >=100000 — AB

## 2023-11-30 LAB — MAGNESIUM: Magnesium: 2 mg/dL (ref 1.7–2.4)

## 2023-11-30 LAB — POTASSIUM: Potassium: 3.7 mmol/L (ref 3.5–5.1)

## 2023-11-30 NOTE — Evaluation (Signed)
 Physical Therapy Evaluation Patient Details Name: Brittany Benton MRN: [id number] DOB: 11-12-1955 Today's Date: 11/30/2023  History of Present Illness  Brittany Benton is a 68 y.o. female with medical history significant of  Parkinson's disease , HTN and hypothyroidism, recurrent UTIs, who presents with change in mental status hallucinations and generalized weakness symptoms that typically occur when patient has a UTI. Patient is alert and oriented and no current complaints. Per husband change in mental status and weakness ongoing x few days. Patient just here 5 days ago.   Clinical Impression  Patient received in bed, she is alert, pleasant, labile at times. Not oriented to place, situation. Patient requires mod +2 for bed mobility and Max +2 for sit to stand and pivot to [facility]. Patient required total A for peri care. She will continue to benefit from skilled PT to improve mobility, balance, independence.            If plan is discharge home, recommend the following: Two people to help with walking and/or transfers;A lot of help with bathing/dressing/bathroom;Assistance with cooking/housework;Assist for transportation;Supervision due to cognitive status;Direct supervision/assist for financial management;Direct supervision/assist for medications management   Can travel by private vehicle   No    Equipment Recommendations None recommended by PT  Recommendations for Other Services       Functional Status Assessment Patient has had a recent decline in their functional status and/or demonstrates limited ability to make significant improvements in function in a reasonable and predictable amount of time     Precautions / Restrictions Precautions Precautions: Fall Recall of Precautions/Restrictions: Impaired Restrictions Weight Bearing Restrictions Per Provider Order: No      Mobility  Bed Mobility Overal bed mobility: Needs Assistance Bed Mobility: Supine to Sit, Sit to Supine     Supine  to sit: Mod assist, +2 for physical assistance, Used rails, HOB elevated Sit to supine: Mod assist, +2 for physical assistance   General bed mobility comments: posterior leaning in sitting edge of bed. Fearful when being assisted in scooting out to edge of bed    Transfers Overall transfer level: Needs assistance Equipment used: Rolling walker (2 wheels), None Transfers: Sit to/from Stand, Bed to chair/wheelchair/BSC Sit to Stand: Max assist, +2 physical assistance Stand pivot transfers: Max assist, +2 physical assistance Step pivot transfers: Mod assist, +2 physical assistance       General transfer comment: Patient performed stand pivot to [facility] with max +2 assist, attempting to sit prior to getting turned to [facility], heavy posterior leaning. For return to bed, utilized RW and patient was able to take a few steps with mod +2 assist.    Ambulation/Gait               General Gait Details: unable  Stairs            Wheelchair Mobility     Tilt Bed    Modified Rankin (Stroke Patients Only)       Balance Overall balance assessment: Needs assistance Sitting-balance support: No upper extremity supported, Feet unsupported Sitting balance-Leahy Scale: Poor   Postural control: Posterior lean Standing balance support: Bilateral upper extremity supported, During functional activity, Reliant on assistive device for balance Standing balance-Leahy Scale: Poor                               Pertinent Vitals/Pain Pain Assessment Pain Assessment: PAINAD Breathing: normal Negative Vocalization: none Facial Expression: smiling or inexpressive Body  Language: relaxed Consolability: no need to console PAINAD Score: 0    Home Living Family/patient expects to be discharged to:: Skilled nursing facility Living Arrangements: Spouse/significant other Available Help at Discharge: Family;Available 24 hours/day Type of Home: House Home Access: Ramped entrance Entrance  Stairs-Rails: Right Entrance Stairs-Number of Steps: 4 Alternate Level Stairs-Number of Steps: split level home -- after entering house, 4 steps to get to main level with hand rail on R Home Layout: Two level Home Equipment: Rollator (4 wheels);Rolling Walker (2 wheels);BSC/3in1;Tub bench;Grab bars - toilet;Wheelchair - manual Additional Comments: grab bar at bed side    Prior Function Prior Level of Function : Needs assist;History of Falls (last six months)             Mobility Comments: uses RW in home, manual WC in community; 3 falls in the last 6 months; high fear of falling ADLs Comments: Pt reports she is able to transfer, dress, toilet, bathe with Mod I; husband assists with all IADL     Extremity/Trunk Assessment   Upper Extremity Assessment Upper Extremity Assessment: Generalized weakness    Lower Extremity Assessment Lower Extremity Assessment: Generalized weakness    Cervical / Trunk Assessment Cervical / Trunk Assessment: Normal  Communication   Communication Communication: No apparent difficulties    Cognition Arousal: Alert Behavior During Therapy: Lability, Anxious   PT - Cognitive impairments: History of cognitive impairments, Orientation, Awareness, Memory, Initiation, Sequencing, Problem solving, Safety/Judgement   Orientation impairments: Place, Time, Situation                   PT - Cognition Comments: Patient talking about cats in her room and asking about if she was booked and taken into custody. Following commands: Impaired Following commands impaired: Follows one step commands inconsistently, Follows one step commands with increased time     Cueing Cueing Techniques: Verbal cues, Gestural cues     General Comments      Exercises     Assessment/Plan    PT Assessment Patient needs continued PT services  PT Problem List Decreased activity tolerance;Decreased balance;Decreased mobility;Decreased coordination;Decreased  cognition;Decreased safety awareness;Decreased knowledge of use of DME       PT Treatment Interventions Gait training;DME instruction;Therapeutic activities;Functional mobility training;Therapeutic exercise;Balance training;Neuromuscular re-education;Cognitive remediation;Patient/family education    PT Goals (Current goals can be found in the Care Plan section)  Acute Rehab PT Goals Patient Stated Goal: none stated, husband apparently looking for placement as he cannot take care of her right now. PT Goal Formulation: Patient unable to participate in goal setting Time For Goal Achievement: 12/14/23 Potential to Achieve Goals: Good    Frequency Min 2X/week     Co-evaluation PT/OT/SLP Co-Evaluation/Treatment: Yes Reason for Co-Treatment: Necessary to address cognition/behavior during functional activity;For patient/therapist safety;To address functional/ADL transfers PT goals addressed during session: Mobility/safety with mobility;Balance;Proper use of DME         AM-PAC PT 6 Clicks Mobility  Outcome Measure Help needed turning from your back to your side while in a flat bed without using bedrails?: A Lot Help needed moving from lying on your back to sitting on the side of a flat bed without using bedrails?: Total Help needed moving to and from a bed to a chair (including a wheelchair)?: A Lot Help needed standing up from a chair using your arms (e.g., wheelchair or bedside chair)?: A Lot Help needed to walk in hospital room?: Total Help needed climbing 3-5 steps with a railing? : Total 6 Click Score: 9  End of Session   Activity Tolerance: Patient tolerated treatment well Patient left: in bed;with call [place]/phone within reach;with bed alarm set Nurse Communication: Mobility status PT Visit Diagnosis: Unsteadiness on feet (R26.81);Repeated falls (R29.6);Muscle weakness (generalized) (M62.81);History of falling (Z91.81);Other abnormalities of gait and mobility (R26.89);Adult,  failure to thrive (R62.7)    Time: 8669-8642 PT Time Calculation (min) (ACUTE ONLY): 27 min   Charges:   PT Evaluation $PT Eval Moderate Complexity: 1 Mod   PT General Charges $$ ACUTE PT VISIT: 1 Visit         Patrizia Paule, PT, GCS 11/30/23,2:48 PM

## 2023-11-30 NOTE — Progress Notes (Signed)
  PROGRESS NOTE    Brittany Benton  FMW:[id number] DOB: [birth year] DOA: 11/28/2023 PCP: [name], MD  [id number]  LOS: 1 day   Brief hospital course:   Assessment & Plan: Brittany Benton is a 68 y.o. female with medical history significant of  Parkinson's disease , HTN and hypothyroidism, recurrent UTIs, who presents presented from home due to weakness.  Husband thinks that she has a UTI    UTI, ruled out --hold further abx   Acute metabolic encephalopathy, ruled out Likely dementia --history more consistent with paranoia than hallucinations.  Pt was seen by psych during previous admission for similar.  Parkinson's disease  progressive debility  -continue sinemet  --husband unable to take care of pt and brought pt to the ED for facility placement.  Anxiety/Depression --d/c home benzo as that may worsen mental status --cont sertraline     Hypothyroidism -continue synthroid     Hypokalemia --supplement PRN   DVT prophylaxis: Lovenox  SQ Code Status: Full code  Family Communication:  Level of care: Telemetry Medical Dispo:   The patient is from: home Anticipated d/c is to: SNF rehab Anticipated d/c date is: whenever disposition found   Subjective and Interval History:  No new event today.   Objective: Vitals:   11/29/23 1959 11/30/23 0314 11/30/23 0750 11/30/23 1509  BP: 111/63 (!) 104/59 104/63 104/61  Pulse: 99 94 90 99  Resp: 16 16 17 17   Temp: 97.9 F (36.6 C) 97.6 F (36.4 C)  97.8 F (36.6 C)  TempSrc: Oral     SpO2: 100% 98% 97% 97%  Weight:      Height:       No intake or output data in the 24 hours ending 11/30/23 1724  Filed Weights   11/28/23 1703  Weight: 72.5 kg    Examination:   Constitutional: NAD CV: No cyanosis.   RESP: normal respiratory effort, on RA Extremities: No effusions, edema in BLE SKIN: warm, dry   Data Reviewed: I have personally reviewed labs and imaging studies   Ellouise Haber, MD [facility] If  7PM-7AM, please contact night-coverage 11/30/2023, 5:24 PM

## 2023-11-30 NOTE — Evaluation (Signed)
 Occupational Therapy Evaluation Patient Details Name: Brittany Benton MRN: [id number] DOB: 18-Feb-1956 Today's Date: 11/30/2023   History of Present Illness   Brittany Benton is a 68 y.o. female with medical history significant of  Parkinson's disease , HTN and hypothyroidism, recurrent UTIs, who presents with change in mental status hallucinations and generalized weakness symptoms that typically occur when patient has a UTI. Patient is alert and oriented and no current complaints. Per husband change in mental status and weakness ongoing x few days. Patient just here 5 days ago.     Clinical Impressions Pt was seen for OT evaluation this date. Prior to hospital admission, pt required at least some assistance from her spouse for ADL management. Per chart, pt has required increased assistance over the last several months and has had multiple hospital admissions. Pt lives with her spouse in a split level home with at least 4 STE and steps she must navigate once inside. Pt presents with deficits in cognition, strength, balance, and safety awareness affecting safe and optimal ADL completion. Pt currently requires MAX A +2 for SPT to BSC, MAX A for peri-care, and MOD A +2 for bed mobility.  Pt would benefit from skilled OT services to address noted impairments and functional limitations (see below for any additional details) in order to maximize safety and independence while minimizing future risk of falls, injury, and readmission. Anticipate the need for follow up OT services upon acute hospital DC.      If plan is discharge home, recommend the following:   A lot of help with walking and/or transfers;A lot of help with bathing/dressing/bathroom;Assistance with cooking/housework;Direct supervision/assist for medications management;Assist for transportation;Supervision due to cognitive status;Direct supervision/assist for financial management;Help with stairs or ramp for entrance     Functional Status  Assessment   Patient has had a recent decline in their functional status and demonstrates the ability to make significant improvements in function in a reasonable and predictable amount of time.     Equipment Recommendations   None recommended by OT     Recommendations for Other Services         Precautions/Restrictions   Precautions Precautions: Fall Recall of Precautions/Restrictions: Impaired Restrictions Weight Bearing Restrictions Per Provider Order: No     Mobility Bed Mobility Overal bed mobility: Needs Assistance Bed Mobility: Supine to Sit, Sit to Supine     Supine to sit: Mod assist, +2 for physical assistance, Used rails, HOB elevated Sit to supine: Mod assist, +2 for physical assistance   General bed mobility comments: posterior leaning in sitting edge of bed. Fearful when being assisted in scooting out to edge of bed    Transfers Overall transfer level: Needs assistance Equipment used: Rolling walker (2 wheels), None Transfers: Sit to/from Stand, Bed to chair/wheelchair/BSC Sit to Stand: Max assist, +2 physical assistance Stand pivot transfers: Max assist, +2 physical assistance   Step pivot transfers: Mod assist, +2 physical assistance     General transfer comment: Patient performed stand pivot to [facility] with max +2 assist, attempting to sit prior to getting turned to [facility], heavy posterior leaning. For return to bed, utilized RW and patient was able to take a few steps with mod +2 assist.      Balance Overall balance assessment: Needs assistance Sitting-balance support: No upper extremity supported, Feet unsupported Sitting balance-Leahy Scale: Poor Sitting balance - Comments: Heavy posterior lean requiring intermittent support to maintain seated position. Postural control: Posterior lean Standing balance support: Bilateral upper extremity supported, During  functional activity, Reliant on assistive device for balance Standing balance-Leahy Scale:  Poor                             ADL either performed or assessed with clinical judgement   ADL Overall ADL's : Needs assistance/impaired                         Toilet Transfer: +2 for physical assistance;Moderate assistance;Maximal assistance Toilet Transfer Details (indicate cue type and reason): Initial +2 MAX A HHA with pt attempting to sit prior to fully turning to [facility]. Utilized RW for return to bed and pt improved with +2 MOD A to step pivot. Toileting- Clothing Manipulation and Hygiene: Maximal assistance;Sitting/lateral lean Toileting - Clothing Manipulation Details (indicate cue type and reason): MAX A for peri-care after BM On BSC     Functional mobility during ADLs: Moderate assistance;Maximal assistance;Rolling walker (2 wheels);+2 for physical assistance       Vision Baseline Vision/History: 1 Wears glasses Ability to See in Adequate Light: 1 Impaired Patient Visual Report: No change from baseline       Perception         Praxis         Pertinent Vitals/Pain Pain Assessment Pain Assessment: PAINAD Faces Pain Scale: No hurt Breathing: normal Negative Vocalization: none Facial Expression: smiling or inexpressive Body Language: relaxed Consolability: no need to console PAINAD Score: 0 Pain Intervention(s): Monitored during session     Extremity/Trunk Assessment Upper Extremity Assessment Upper Extremity Assessment: Generalized weakness;Difficult to assess due to impaired cognition   Lower Extremity Assessment Lower Extremity Assessment: Generalized weakness;Difficult to assess due to impaired cognition   Cervical / Trunk Assessment Cervical / Trunk Assessment: Normal   Communication Communication Communication: No apparent difficulties   Cognition Arousal: Alert Behavior During Therapy: Lability, Anxious Cognition: Cognition impaired, History of cognitive impairments, No family/caregiver present to determine baseline    Orientation impairments: Time, Situation Awareness: Intellectual awareness impaired, Online awareness impaired Memory impairment (select all impairments): Short-term memory, Non-declarative long-term memory, Working [employer] fast streamer, Conservation officer, historic buildings Attention impairment (select first level of impairment): Sustained attention Executive functioning impairment (select all impairments): Problem solving, Initiation, Sequencing OT - Cognition Comments: tangential and confabulatory; states there are cats and chameleons in her room. Intermittently tearful asking, Is all of my family still alive?. Easily consoled/re-directed to task/topic at hand.                 Following commands: Impaired Following commands impaired: Follows one step commands inconsistently, Follows one step commands with increased time     Cueing  General Comments   Cueing Techniques: Verbal cues;Gestural cues;Tactile cues      Exercises Other Exercises Other Exercises: Educated on role of OT in acute setting, safe transfers, and falls prevention for home and hospital.   Shoulder Instructions      Home Living Family/patient expects to be discharged to:: Skilled nursing facility Living Arrangements: Spouse/significant other Available Help at Discharge: Family;Available 24 hours/day Type of Home: House Home Access: Ramped entrance Entrance Stairs-Number of Steps: 4 Entrance Stairs-Rails: Right Home Layout: Two level Alternate Level Stairs-Number of Steps: split level home -- after entering house, 4 steps to get to main level with hand rail on R Alternate Level Stairs-Rails: Right Bathroom Shower/Tub: Chief Strategy Officer: Handicapped height Bathroom Accessibility: No   Home Equipment: Rollator (4 wheels);Rolling Walker (2 wheels);BSC/3in1;Tub bench;Grab bars -  toilet;Wheelchair - manual   Additional Comments: grab bar at bed side      Prior Functioning/Environment Prior Level of  Function : Needs assist;History of Falls (last six months)  Cognitive Assist : ADLs (cognitive)     Physical Assist : ADLs (physical)     Mobility Comments: uses RW in home, manual WC in community; 3 falls in the last 6 months; high fear of falling ADLs Comments: Pt reports she is able to transfer, dress, toilet, bathe with Mod I; husband assists with all IADL. Howe,ver suspect she is unreliable historian as she is confused t/o session. Information re: PLOF/home set up obtained from chart review. Per chart, pt required assist for ADL management.    OT Problem List: Decreased strength;Decreased activity tolerance;Impaired balance (sitting and/or standing);Decreased cognition   OT Treatment/Interventions: Self-care/ADL training;DME and/or AE instruction;Therapeutic activities;Balance training;Therapeutic exercise;Patient/family education      OT Goals(Current goals can be found in the care plan section)   Acute Rehab OT Goals Patient Stated Goal: to feel better OT Goal Formulation: With patient Time For Goal Achievement: 12/14/23 Potential to Achieve Goals: Good ADL Goals Pt Will Perform Grooming: sitting;with set-up;with supervision Pt Will Perform Upper Body Dressing: sitting;with supervision;with set-up Pt Will Perform Lower Body Dressing: sit to/from stand;sitting/lateral leans;with min assist;with caregiver independent in assisting;with adaptive equipment Pt Will Transfer to Toilet: bedside commode;with mod assist;stand pivot transfer Pt Will Perform Toileting - Clothing Manipulation and hygiene: with adaptive equipment;sitting/lateral leans;with mod assist;sit to/from stand   OT Frequency:  Min 2X/week    Co-evaluation PT/OT/SLP Co-Evaluation/Treatment: Yes Reason for Co-Treatment: Necessary to address cognition/behavior during functional activity;For patient/therapist safety;To address functional/ADL transfers PT goals addressed during session: Mobility/safety with  mobility;Balance;Proper use of DME OT goals addressed during session: ADL's and self-care;Strengthening/ROM      AM-PAC OT 6 Clicks Daily Activity     Outcome Measure Help from another person eating meals?: None Help from another person taking care of personal grooming?: A Little Help from another person toileting, which includes using toliet, bedpan, or urinal?: A Lot Help from another person bathing (including washing, rinsing, drying)?: A Lot Help from another person to put on and taking off regular upper body clothing?: A Little Help from another person to put on and taking off regular lower body clothing?: A Lot 6 Click Score: 16   End of Session Equipment Utilized During Treatment: Rolling walker (2 wheels) Nurse Communication: Mobility status  Activity Tolerance: Patient tolerated treatment well Patient left: with call [place]/phone within reach;in bed;with bed alarm set  OT Visit Diagnosis: Unsteadiness on feet (R26.81);Muscle weakness (generalized) (M62.81)                Time: 8670-8641 OT Time Calculation (min): 29 min Charges:  OT General Charges $OT Visit: 1 Visit OT Evaluation $OT Eval Moderate Complexity: 1 Mod OT Treatments $Self Care/Home Management : 8-22 mins  Jhonny Pelton, M.S., OTR/L 11/30/23, 3:42 PM

## 2023-12-01 DIAGNOSIS — R531 Weakness: Secondary | ICD-10-CM | POA: Diagnosis not present

## 2023-12-01 LAB — URINE CULTURE: Culture: <10000 — AB

## 2023-12-01 MED ORDER — QUETIAPINE FUMARATE 25 MG PO TABS
25.0000 mg | ORAL_TABLET | Freq: Every day | ORAL | Status: AC
  Administered 2023-12-01 (×2): 25 mg via ORAL
  Filled 2023-12-01 (×2): qty 1

## 2023-12-01 NOTE — Plan of Care (Signed)

## 2023-12-01 NOTE — Progress Notes (Signed)
  PROGRESS NOTE    Brittany Benton  FMW:[id number] DOB: 12-23-1955 DOA: 11/28/2023 PCP: Brittany Nancyann FORBES, MD  [id number]  LOS: 2 days   Brief hospital course:   Assessment & Plan: Brittany Benton is a 68 y.o. female with medical history significant of  Parkinson's disease , HTN and hypothyroidism, recurrent UTIs, who presents presented from home due to weakness.  Husband thinks that she has a UTI    UTI, ruled out --hold further abx   Acute metabolic encephalopathy, ruled out Likely dementia --history more consistent with paranoia than hallucinations.  Pt was seen by psych during previous admission for similar. --d/c'ed home benzo as that may worsen mental status --consider psych consult tomorrow  Parkinson's disease  progressive debility  --husband unable to take care of pt and brought pt to the ED for facility placement. --cont Sinemet   Anxiety/Depression --d/c'ed home benzo as that may worsen mental status --cont sertralne    Hypothyroidism -continue synthroid     Hypokalemia --supplement PRN   DVT prophylaxis: Lovenox  SQ Code Status: Full code  Family Communication: son and daughter-in-law updated at bedside today Level of care: Telemetry Medical Dispo:   The patient is from: home Anticipated d/c is to: SNF rehab Anticipated d/c date is: whenever disposition found   Subjective and Interval History:  Pt was a different person today, alert, oriented, coherent.  Pt expressed her wish to return home.   Objective: Vitals:   11/30/23 1509 11/30/23 1917 12/01/23 0357 12/01/23 1531  BP: 104/61 111/66 112/75 114/65  Pulse: 99 (!) 105 (!) 105 (!) 104  Resp: 17 20 20 17   Temp: 97.8 F (36.6 C) 97.7 F (36.5 C) 97.7 F (36.5 C) 97.9 F (36.6 C)  TempSrc:  Oral    SpO2: 97% 93% 97% 98%  Weight:      Height:        Intake/Output Summary (Last 24 hours) at 12/01/2023 1852 Last data filed at 12/01/2023 0900 Gross per 24 hour  Intake 240 ml  Output 100 ml  Net  140 ml    Filed Weights   11/28/23 1703  Weight: 72.5 kg    Examination:   Constitutional: NAD, AAOx3 HEENT: conjunctivae and lids normal, EOMI CV: No cyanosis.   RESP: normal respiratory effort, on RA Neuro: II - XII grossly intact.   Psych: Normal mood and affect.     Data Reviewed: I have personally reviewed labs and imaging studies   Brittany Haber, MD [facility] If 7PM-7AM, please contact night-coverage 12/01/2023, 6:52 PM

## 2023-12-02 ENCOUNTER — Ambulatory Visit: Admitting: Urology

## 2023-12-02 DIAGNOSIS — R531 Weakness: Secondary | ICD-10-CM | POA: Diagnosis not present

## 2023-12-02 NOTE — Plan of Care (Addendum)
 Patient alert to self,no acute distress pt still  continue to have hallucinations at night time.Safety measures maintain Problem: Education: Goal: Knowledge of General Education information will improve Description: Including pain rating scale, medication(s)/side effects and non-pharmacologic comfort measures Outcome: Progressing   Problem: Health Behavior/Discharge Planning: Goal: Ability to manage health-related needs will improve Outcome: Progressing   Problem: Clinical Measurements: Goal: Ability to maintain clinical measurements within normal limits will improve Outcome: Progressing Goal: Will remain free from infection Outcome: Progressing Goal: Diagnostic test results will improve Outcome: Progressing Goal: Respiratory complications will improve Outcome: Progressing Goal: Cardiovascular complication will be avoided Outcome: Progressing

## 2023-12-02 NOTE — TOC Initial Note (Addendum)
 Transition of Care [facility]) - Initial/Assessment Note    Patient Details  Name: Brittany Benton MRN: [id number] Date of Birth: [birth year]  Transition [facility]) CM/SW Contact:    [name], RN Phone Number: 12/02/2023, 2:15 PM  Clinical Narrative:                  Patient admitted from home with husband Patient A&Ox1 Spoke with patients husband [name], and her sister [name].  They are both in agreement for SNF  Existing PASRR Fl2 sent for signature Bed search initiated       305 pm Vm left for husband to review bed offers  415 pm husband accepted bed at [facility].  Accepted in HUB and notified Tammy at [facility]   Patient Goals and CMS Choice            Expected Discharge Plan and Services                                              Prior Living Arrangements/Services                       Activities of Daily Living   ADL Screening (condition at time of admission) Independently performs ADLs?: No Does the patient have a NEW difficulty with bathing/dressing/toileting/self-feeding that is expected to last >3 days?: No Does the patient have a NEW difficulty with getting in/out of bed, walking, or climbing stairs that is expected to last >3 days?: No Does the patient have a NEW difficulty with communication that is expected to last >3 days?: No Is the patient deaf or have difficulty hearing?: No Does the patient have difficulty seeing, even when wearing glasses/contacts?: No Does the patient have difficulty concentrating, remembering, or making decisions?: Yes  Permission Sought/Granted                  Emotional Assessment              Admission diagnosis:  UTI (urinary tract infection) [N39.0] Recurrent UTI [N39.0] Generalized weakness [R53.1] Patient Active Problem List   Diagnosis Date Noted   UTI (urinary tract infection) 11/29/2023   Confusion 11/19/2023   Generalized weakness 10/06/2023   Urinary tract infection 10/06/2023    Hypotension 10/06/2023   Weakness generalized 10/06/2023   Depression    B12 deficiency 06/11/2022   Iron deficiency anemia 04/10/2022   Morbid obesity (HCC) 03/30/2022   Vitamin A  deficiency 08/15/2021   Parkinson's disease (HCC) 08/03/2021   Acute urinary retention 08/03/2021   Meningioma (HCC) 04/05/2021   Hypothyroidism 08/15/2020   Osteopenia 06/14/2020   Status post total hip replacement, 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   Osteoarthritis of right hip 03/26/2016   H/O gastric bypass 03/26/2016   Sinus tachycardia 08/01/2015   Obesity (BMI 30-39.9) 06/08/2015   OSA (obstructive sleep apnea) 06/08/2015   GERD without esophagitis 06/08/2015   Incisional hernia 06/08/2015   Arthritis of hip 03/23/2015   Left knee pain 02/15/2015   Vitamin D  deficiency 09/29/2014   Lump of right breast 09/22/2014   Prediabetes 09/22/2014   Pruritic dermatitis 09/22/2014   Obstructive sleep apnea 09/22/2014   Allergic rhinitis 09/15/2009   Hypersomnia 09/14/2009   Panic disorder 08/06/2008   Essential (primary) hypertension 07/16/2007   Hyperlipidemia 07/16/2007   PCP:  [name], MD Pharmacy:   [facility] -  [place], [place] - [address] [place] [address] Phone: [phone] Fax: [phone]     Social Drivers of Health (SDOH) Social History: SDOH Screenings   Food Insecurity: No Food Insecurity (11/29/2023)  Housing: Low Risk  (11/29/2023)  Transportation Needs: No Transportation Needs (11/29/2023)  Utilities: Not At Risk (11/29/2023)  Alcohol Screen: Low Risk  (03/30/2022)  Depression (PHQ2-9): Low Risk  (11/04/2023)  Recent Concern: Depression (PHQ2-9) - High Risk (09/12/2023)  Financial Resource Strain: Low Risk  (09/20/2023)  Physical Activity: Insufficiently Active (11/14/2022)  Social Connections: Socially Isolated (11/29/2023)  Stress: No Stress Concern Present (11/14/2022)  Tobacco Use: Low Risk  (11/28/2023)  Health Literacy: Adequate Health Literacy  (11/14/2022)   SDOH Interventions:     Readmission Risk Interventions    11/29/2023   11:38 AM  Readmission Risk Prevention Plan  Post Dischage Appt Complete  Medication Screening Complete  Transportation Screening Complete

## 2023-12-02 NOTE — Plan of Care (Signed)

## 2023-12-02 NOTE — Progress Notes (Signed)
  PROGRESS NOTE    [name]  FMW:[id number] DOB: [birth year] DOA: 11/28/2023 PCP: [name], MD  [id number]  LOS: 3 days   Brief hospital course:   Assessment & Plan: ROSIA Benton is a 68 y.o. female with medical history significant of  Parkinson's disease , HTN and hypothyroidism, recurrent UTIs, who presents presented from home due to weakness.  Husband thinks that she has a UTI    UTI, ruled out --hold further abx   Acute metabolic encephalopathy, ruled out Likely dementia --history more consistent with paranoia than hallucinations.  Pt was seen by psych during previous admission for similar. --d/c'ed home benzo as that may worsen mental status  Parkinson's disease  progressive debility  --husband unable to take care of pt and brought pt to the ED for facility placement. --cont Sinemet   Anxiety/Depression --d/c'ed home benzo as that may worsen mental status --cont sertralne    Hypothyroidism -continue synthroid     Hypokalemia --supplement PRN   DVT prophylaxis: Lovenox  SQ Code Status: Full code  Family Communication:  Level of care: Telemetry Medical Dispo:   The patient is from: home Anticipated d/c is to: SNF rehab Anticipated d/c date is: whenever disposition found   Subjective and Interval History:  Pt had no memory of her son and daughter-in-law's visit yesterday.  RN reported hallucination at night.   Objective: Vitals:   12/01/23 2140 12/02/23 0208 12/02/23 1627 12/02/23 1943  BP: 107/68 110/73 (!) 104/58 125/76  Pulse: (!) 108  (!) 103 (!) 103  Resp: 18 18 18 16   Temp: 98 F (36.7 C) 97.6 F (36.4 C) 98.7 F (37.1 C) 97.6 F (36.4 C)  TempSrc: Oral     SpO2: 98% 96% 97% 98%  Weight:      Height:        Intake/Output Summary (Last 24 hours) at 12/02/2023 2044 Last data filed at 12/02/2023 0900 Gross per 24 hour  Intake 480 ml  Output --  Net 480 ml    Filed Weights   11/28/23 1703  Weight: 72.5 kg    Examination:    Constitutional: NAD, alert, oriented to person, appropriately interactive HEENT: conjunctivae and lids normal, EOMI CV: No cyanosis.   RESP: normal respiratory effort, on RA Neuro: II - XII grossly intact.     Data Reviewed: I have personally reviewed labs and imaging studies   Ellouise Haber, MD [facility] If 7PM-7AM, please contact night-coverage 12/02/2023, 8:44 PM

## 2023-12-02 NOTE — NC FL2 (Signed)
 [place]  MEDICAID FL2 LEVEL OF CARE FORM     IDENTIFICATION  Patient Name: Brittany Benton Birthdate: [birth year] Sex: female Admission Date (Current Location): 11/28/2023  [facility] and [place] Number:  Chiropodist and Address:         Provider Number: [id number]  Attending Physician Name and Address:  Awanda City, MD  Relative Name and Phone Number:       Current Level of Care:   Recommended Level of Care:   Prior Approval Number:    Date Approved/Denied:   PASRR Number: [id number]  Discharge Plan: Home    Current Diagnoses: Patient Active Problem List   Diagnosis Date Noted   UTI (urinary tract infection) 11/29/2023   Confusion 11/19/2023   Generalized weakness 10/06/2023   Urinary tract infection 10/06/2023   Hypotension 10/06/2023   Weakness generalized 10/06/2023   Depression    B12 deficiency 06/11/2022   Iron deficiency anemia 04/10/2022   Morbid obesity (HCC) 03/30/2022   Vitamin A  deficiency 08/15/2021   Parkinson's disease (HCC) 08/03/2021   Acute urinary retention 08/03/2021   Meningioma (HCC) 04/05/2021   Hypothyroidism 08/15/2020   Osteopenia 06/14/2020   Status post total hip replacement, 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   Osteoarthritis of right hip 03/26/2016   H/O gastric bypass 03/26/2016   Sinus tachycardia 08/01/2015   Obesity (BMI 30-39.9) 06/08/2015   OSA (obstructive sleep apnea) 06/08/2015   GERD without esophagitis 06/08/2015   Incisional hernia 06/08/2015   Arthritis of hip 03/23/2015   Left knee pain 02/15/2015   Vitamin D  deficiency 09/29/2014   Lump of right breast 09/22/2014   Prediabetes 09/22/2014   Pruritic dermatitis 09/22/2014   Obstructive sleep apnea 09/22/2014   Allergic rhinitis 09/15/2009   Hypersomnia 09/14/2009   Panic disorder 08/06/2008   Essential (primary) hypertension 07/16/2007   Hyperlipidemia 07/16/2007    Orientation RESPIRATION BLADDER Height & Weight     Self  Normal Incontinent Weight: 72.5  kg Height:  5' (152.4 cm)  BEHAVIORAL SYMPTOMS/MOOD NEUROLOGICAL BOWEL NUTRITION STATUS      Incontinent Diet (regular)  AMBULATORY STATUS COMMUNICATION OF NEEDS Skin   Total Care Verbally Normal                       Personal Care Assistance Level of Assistance              Functional Limitations Info             SPECIAL CARE FACTORS FREQUENCY  OT (By licensed OT), PT (By licensed PT)                    Contractures      Additional Factors Info  Code Status, Allergies Code Status Info: full Allergies Info: codeine, morphine, tape, tramadol            Current Medications (12/02/2023):  This is the current hospital active medication list Current Facility-Administered Medications  Medication Dose Route Frequency Provider Last Rate Last Admin   acetaminophen  (TYLENOL ) tablet 650 mg  650 mg Oral Q6H PRN Debby Camila LABOR, MD       Or   acetaminophen  (TYLENOL ) suppository 650 mg  650 mg Rectal Q6H PRN Debby Camila LABOR, MD       albuterol  (PROVENTIL ) (2.5 MG/3ML) 0.083% nebulizer solution 2.5 mg  2.5 mg Nebulization Q2H PRN Debby Camila LABOR, MD       atorvastatin  (LIPITOR) tablet 20 mg  20 mg Oral QPM Debby Camila LABOR, MD  20 mg at 12/01/23 1730   calcium  carbonate (TUMS - dosed in mg elemental calcium ) chewable tablet 200 mg of elemental calcium   1 tablet Oral TID WC Awanda City, MD   200 mg of elemental calcium  at 12/02/23 1111   Carbidopa -Levodopa  ER (SINEMET  CR) 25-100 MG tablet controlled release 1 tablet  1 tablet Oral BID Debby Hitch A, MD   1 tablet at 12/02/23 1100   enoxaparin  (LOVENOX ) injection 40 mg  40 mg Subcutaneous Q24H Awanda City, MD   40 mg at 12/01/23 2144   famotidine  (PEPCID ) tablet 20 mg  20 mg Oral QPM Thomas, Sara-Maiz A, MD   20 mg at 12/01/23 1731   feeding supplement (BOOST / RESOURCE BREEZE) liquid 1 Container  1 Container Oral TID BM Awanda City, MD   1 Container at 12/02/23 1100   haloperidol  lactate (HALDOL ) injection 2  mg  2 mg Intravenous Q6H PRN Awanda City, MD   2 mg at 11/30/23 2135   levothyroxine  (SYNTHROID ) tablet 44 mcg  44 mcg Oral Q0600 Thomas, Sara-Maiz A, MD   44 mcg at 12/02/23 0900   multivitamin with minerals tablet 1 tablet  1 tablet Oral BID Awanda City, MD   1 tablet at 12/02/23 0900   ondansetron  (ZOFRAN ) tablet 4 mg  4 mg Oral Q6H PRN Debby Hitch LABOR, MD       Or   ondansetron  (ZOFRAN ) injection 4 mg  4 mg Intravenous Q6H PRN Debby Hitch LABOR, MD       sertraline  (ZOLOFT ) tablet 100 mg  100 mg Oral Daily Thomas, Sara-Maiz A, MD   100 mg at 12/02/23 0900     Discharge Medications: Please see discharge summary for a list of discharge medications.  Relevant Imaging Results:  Relevant Lab Results:   Additional Information SS# [id number]  DOB: May 18, 1955  Brittany ONEIDA Haddock, RN

## 2023-12-03 ENCOUNTER — Telehealth: Payer: Self-pay

## 2023-12-03 ENCOUNTER — Other Ambulatory Visit

## 2023-12-03 DIAGNOSIS — M6259 Muscle wasting and atrophy, not elsewhere classified, multiple sites: Secondary | ICD-10-CM | POA: Diagnosis not present

## 2023-12-03 DIAGNOSIS — G4700 Insomnia, unspecified: Secondary | ICD-10-CM | POA: Diagnosis not present

## 2023-12-03 DIAGNOSIS — E039 Hypothyroidism, unspecified: Secondary | ICD-10-CM | POA: Diagnosis not present

## 2023-12-03 DIAGNOSIS — R300 Dysuria: Secondary | ICD-10-CM | POA: Diagnosis not present

## 2023-12-03 DIAGNOSIS — G2581 Restless legs syndrome: Secondary | ICD-10-CM | POA: Diagnosis not present

## 2023-12-03 DIAGNOSIS — Z7401 Bed confinement status: Secondary | ICD-10-CM | POA: Diagnosis not present

## 2023-12-03 DIAGNOSIS — Z9181 History of falling: Secondary | ICD-10-CM | POA: Diagnosis not present

## 2023-12-03 DIAGNOSIS — R531 Weakness: Secondary | ICD-10-CM | POA: Diagnosis not present

## 2023-12-03 DIAGNOSIS — E876 Hypokalemia: Secondary | ICD-10-CM | POA: Diagnosis not present

## 2023-12-03 DIAGNOSIS — G20A1 Parkinson's disease without dyskinesia, without mention of fluctuations: Secondary | ICD-10-CM | POA: Diagnosis not present

## 2023-12-03 DIAGNOSIS — R410 Disorientation, unspecified: Secondary | ICD-10-CM | POA: Diagnosis not present

## 2023-12-03 DIAGNOSIS — I951 Orthostatic hypotension: Secondary | ICD-10-CM | POA: Diagnosis not present

## 2023-12-03 DIAGNOSIS — N3001 Acute cystitis with hematuria: Secondary | ICD-10-CM | POA: Diagnosis not present

## 2023-12-03 DIAGNOSIS — I10 Essential (primary) hypertension: Secondary | ICD-10-CM | POA: Diagnosis not present

## 2023-12-03 DIAGNOSIS — N390 Urinary tract infection, site not specified: Secondary | ICD-10-CM | POA: Diagnosis not present

## 2023-12-03 DIAGNOSIS — E538 Deficiency of other specified B group vitamins: Secondary | ICD-10-CM | POA: Diagnosis not present

## 2023-12-03 DIAGNOSIS — K2100 Gastro-esophageal reflux disease with esophagitis, without bleeding: Secondary | ICD-10-CM | POA: Diagnosis not present

## 2023-12-03 DIAGNOSIS — R2681 Unsteadiness on feet: Secondary | ICD-10-CM | POA: Diagnosis not present

## 2023-12-03 DIAGNOSIS — R4789 Other speech disturbances: Secondary | ICD-10-CM | POA: Diagnosis not present

## 2023-12-03 DIAGNOSIS — F322 Major depressive disorder, single episode, severe without psychotic features: Secondary | ICD-10-CM | POA: Diagnosis not present

## 2023-12-03 DIAGNOSIS — K219 Gastro-esophageal reflux disease without esophagitis: Secondary | ICD-10-CM | POA: Diagnosis not present

## 2023-12-03 DIAGNOSIS — D509 Iron deficiency anemia, unspecified: Secondary | ICD-10-CM | POA: Diagnosis not present

## 2023-12-03 DIAGNOSIS — Z741 Need for assistance with personal care: Secondary | ICD-10-CM | POA: Diagnosis not present

## 2023-12-03 DIAGNOSIS — E7849 Other hyperlipidemia: Secondary | ICD-10-CM | POA: Diagnosis not present

## 2023-12-03 MED ORDER — CALCIUM CARBONATE ANTACID 500 MG PO CHEW: 1.0000 | CHEWABLE_TABLET | Freq: Three times a day (TID) | ORAL | Status: AC

## 2023-12-03 MED ORDER — ADULT MULTIVITAMIN W/MINERALS CH: 1.0000 | ORAL_TABLET | Freq: Every day | ORAL | Status: AC

## 2023-12-03 NOTE — TOC Transition Note (Signed)
 Transition of Care [facility]) - Discharge Note   Patient Details  Name: Brittany Benton MRN: [id number] Date of Birth: 1955/12/05  Transition of Care [facility]) CM/SW Contact:  [name], RN Phone Number: 12/03/2023, 3:26 PM   Clinical Narrative:     Patient will DC to: [facility]  Anticipated DC date: 12/03/23  Family notified: spouse [name] Transport by: [place]  Per MD patient ready for DC to . RN, , patient's family, and facility notified of DC. Discharge Summary sent to facility. RN given number for report. DC packet on chart. Ambulance transport requested for patient.   TOC signing off.          Patient Goals and CMS Choice            Discharge Placement                       Discharge Plan and Services Additional resources added to the After Visit Summary for                                       Social Drivers of Health (SDOH) Interventions SDOH Screenings   Food Insecurity: No Food Insecurity (11/29/2023)  Housing: Low Risk  (11/29/2023)  Transportation Needs: No Transportation Needs (11/29/2023)  Utilities: Not At Risk (11/29/2023)  Alcohol Screen: Low Risk  (03/30/2022)  Depression (PHQ2-9): Low Risk  (11/04/2023)  Recent Concern: Depression (PHQ2-9) - High Risk (09/12/2023)  Financial Resource Strain: Low Risk  (09/20/2023)  Physical Activity: Insufficiently Active (11/14/2022)  Social Connections: Socially Isolated (11/29/2023)  Stress: No Stress Concern Present (11/14/2022)  Tobacco Use: Low Risk  (11/28/2023)  Health Literacy: Adequate Health Literacy (11/14/2022)     Readmission Risk Interventions    11/29/2023   11:38 AM  Readmission Risk Prevention Plan  Post Dischage Appt Complete  Medication Screening Complete  Transportation Screening Complete

## 2023-12-03 NOTE — Progress Notes (Signed)
 Mobility Specialist - Progress Note   12/03/23 1156  Mobility  Activity Stood at bedside;Pivoted/transferred from bed to chair  Level of Assistance Contact guard assist, steadying assist  Assistive Device Front wheel walker  Distance Ambulated (ft) 5 ft  Activity Response Tolerated well  Mobility visit 1 Mobility     Pt lying in bed upon arrival, utilizing RA. Pt agreeable to activity. Completed bed mobility with minA. STS x2 and step-pivot to chair with CGA. No LOB. VC for sequencing/and hand placement. Pt left in chair with alarm set, needs in reach. Spouse at bedside.    Lennette Seip Mobility Specialist 12/03/23, 11:58 AM

## 2023-12-03 NOTE — Progress Notes (Signed)
   12/03/2023  Patient ID: Brittany Benton, female   DOB: 12/23/1955, 68 y.o.   MRN: [id number]  This patient was scheduled for a 2 pm appointment, but per chart review she is currently admitted to the hospital related to dementia again. Patient may have placement to a skilled nursing facility (rehab) on discharge; noted patient's husband is unable to take care of [name].   During current hospitalization home BZD discontinued. Will follow on whether pharmacy will follow peripherally as patient does not long term placement at a SNF.   Dorcas Solian, PharmD Clinical Pharmacist Cell: [phone]

## 2023-12-03 NOTE — Plan of Care (Signed)

## 2023-12-03 NOTE — Progress Notes (Signed)
 Report given to [facility] nurse. Awaiting EMS pick up. Husband at bedside.

## 2023-12-03 NOTE — Progress Notes (Signed)
 Physical Therapy Treatment Patient Details Name: Brittany Benton MRN: [id number] DOB: [birth year] Today's Date: 12/03/2023   History of Present Illness ERAN WINDISH is a 68 y.o. female with medical history significant of  Parkinson's disease , HTN and hypothyroidism, recurrent UTIs, who presents with change in mental status hallucinations and generalized weakness symptoms that typically occur when patient has a UTI. Patient is alert and oriented and no current complaints. Per husband change in mental status and weakness ongoing x few days. Patient just here 5 days ago.    PT Comments  Pt was sitting in recliner upon arrival. She had recently got OOB with mobility techs however endorses wanting to return to bed. Pt remains only truly oriented to self but was cooperative and pleasant throughout. She was able to stand two times from recliner to RW. Posterior push that she is able to correct but unable to maintain. Remains high fall risk. Shuffling gait with RW + distance limited by pt/fatigue. Pt is high fall risk. Posterior LOB several time with intervention to prevent falling. Pt returned to bed at conclusion of session. Recommend continued skilled PT at DC to maximize independence and safety with all ADLs    If plan is discharge home, recommend the following: A lot of help with walking and/or transfers;A lot of help with bathing/dressing/bathroom;Assistance with cooking/housework;Direct supervision/assist for medications management;Direct supervision/assist for financial management;Help with stairs or ramp for entrance;Assist for transportation;Supervision due to cognitive status     Equipment Recommendations  Other (comment) (Defer to next level of care)       Precautions / Restrictions Precautions Precautions: Fall Recall of Precautions/Restrictions: Impaired     Mobility  Bed Mobility Overal bed mobility: Needs Assistance Bed Mobility: Sit to Supine  Sit to supine: Mod assist, Used  rails General bed mobility comments: Increased tie + vcs for technique and sequencing improvements    Transfers Overall transfer level: Needs assistance Equipment used: Rolling walker (2 wheels) Transfers: Sit to/from Stand Sit to Stand: Mod assist  General transfer comment: Pt stood 3 x from recliner to RW then 1 x form EOB. posterior LOB noted with vcs + assist, was able to correct but unable to maintain    Ambulation/Gait Ambulation/Gait assistance: Min assist, Mod assist Gait Distance (Feet): 10 Feet Assistive device: Rolling walker (2 wheels) Gait Pattern/deviations: Step-to pattern, Shuffle Gait velocity: decreased  General Gait Details: Pt is at high risk of falls. Shuffling gait and posterior LOB several times with intervention to prevent falling    Balance Overall balance assessment: Needs assistance Sitting-balance support: No upper extremity supported, Feet unsupported Sitting balance-Leahy Scale: Fair     Standing balance support: Bilateral upper extremity supported, During functional activity, Reliant on assistive device for balance Standing balance-Leahy Scale: Poor Standing balance comment: High fall risk       Communication Communication Communication: No apparent difficulties  Cognition Arousal: Alert Behavior During Therapy: WFL for tasks assessed/performed   PT - Cognitive impairments: History of cognitive impairments   Orientation impairments: Place, Time, Situation    PT - Cognition Comments: Pt is alert but disoriented. is able to follow simple commands fairly consistently however struggles with multisetp vcs Following commands: Intact      Cueing Cueing Techniques: Verbal cues, Tactile cues         Pertinent Vitals/Pain Pain Assessment Pain Assessment: No/denies pain     PT Goals (current goals can now be found in the care plan section) Acute Rehab PT Goals Patient Stated Goal:  get better and then go home Progress towards PT goals:  Progressing toward goals    Frequency    Min 2X/week       Co-evaluation     PT goals addressed during session: Mobility/safety with mobility;Balance;Proper use of DME;Strengthening/ROM        AM-PAC PT 6 Clicks Mobility   Outcome Measure  Help needed turning from your back to your side while in a flat bed without using bedrails?: A Lot Help needed moving from lying on your back to sitting on the side of a flat bed without using bedrails?: A Lot Help needed moving to and from a bed to a chair (including a wheelchair)?: A Lot Help needed standing up from a chair using your arms (e.g., wheelchair or bedside chair)?: A Lot Help needed to walk in hospital room?: A Lot Help needed climbing 3-5 steps with a railing? : Total 6 Click Score: 11    End of Session   Activity Tolerance: Patient tolerated treatment well;Patient limited by fatigue Patient left: in bed;with call Bilek/phone within reach;with bed alarm set Nurse Communication: Mobility status PT Visit Diagnosis: Unsteadiness on feet (R26.81);Repeated falls (R29.6);Muscle weakness (generalized) (M62.81);History of falling (Z91.81);Other abnormalities of gait and mobility (R26.89);Adult, failure to thrive (R62.7)     Time: 1151-1208 PT Time Calculation (min) (ACUTE ONLY): 17 min  Charges:    $Therapeutic Activity: 8-22 mins PT General Charges $$ ACUTE PT VISIT: 1 Visit                    Rankin Essex PTA 12/03/23, 2:26 PM

## 2023-12-03 NOTE — TOC Progression Note (Signed)
 Transition of Care [facility]) - Progression Note    Patient Details  Name: Brittany Benton MRN: [id number] Date of Birth: 20-Feb-1956  Transition [facility]) CM/SW Contact  [name], RN Phone Number: 12/03/2023, 12:23 PM  Clinical Narrative:     Insurance auth received for [employer]                     Expected Discharge Plan and Services                                               Social Drivers of Health (SDOH) Interventions SDOH Screenings   Food Insecurity: No Food Insecurity (11/29/2023)  Housing: Low Risk  (11/29/2023)  Transportation Needs: No Transportation Needs (11/29/2023)  Utilities: Not At Risk (11/29/2023)  Alcohol Screen: Low Risk  (03/30/2022)  Depression (PHQ2-9): Low Risk  (11/04/2023)  Recent Concern: Depression (PHQ2-9) - High Risk (09/12/2023)  Financial Resource Strain: Low Risk  (09/20/2023)  Physical Activity: Insufficiently Active (11/14/2022)  Social Connections: Socially Isolated (11/29/2023)  Stress: No Stress Concern Present (11/14/2022)  Tobacco Use: Low Risk  (11/28/2023)  Health Literacy: Adequate Health Literacy (11/14/2022)    Readmission Risk Interventions    11/29/2023   11:38 AM  Readmission Risk Prevention Plan  Post Dischage Appt Complete  Medication Screening Complete  Transportation Screening Complete

## 2023-12-03 NOTE — Discharge Summary (Signed)
 Physician Discharge Summary   Brittany Benton  female DOB: 1956/01/24  FMW:[id number]  PCP: [name], MD  Admit date: 11/28/2023 Discharge date: 12/03/2023  Admitted From: home Disposition:  SNF rehab CODE STATUS: Full code  Discharge Instructions     Diet general   Complete by: As directed       Hospital Course:  For full details, please see H&P, progress notes, consult notes and ancillary notes.  Briefly,  Brittany Benton is a 68 y.o. female with medical history significant of  Parkinson's disease, HTN and hypothyroidism, recurrent UTIs, who presented presented from home due to weakness.  Husband thinks that pt has a UTI    UTI, ruled out --urine cx with yeast  --hold further abx   Acute metabolic encephalopathy, ruled out Likely dementia --pt is often confused, not oriented, paranoid and forgetful.  Condition has been ongoing since prior to presentation, and did not resolve during hospitalization.  No acute etiology found to suggest acute metabolic encephalopathy. --d/c'ed home benzo as that may worsen mental status   Parkinson's disease  Progressive debility  --husband unable to take care of pt and brought pt to the ED for facility placement. --cont Sinemet    Anxiety/Depression --d/c'ed home benzo as that may worsen mental status --cont sertraline  --resume home imipramine  after discharge   Hypothyroidism -continue synthroid     Hypokalemia --supplemented PRN  Hx of bariatric surgery --per dietician rec:  -Liberalize diet to regular for widest variety of meal selections -500 mg calcium  carbonate TID -MVI with minerals daily -Boost Breeze po TID, each supplement provides 250 kcal and 9 grams of protein    Unless noted above, medications under STOP list are ones pt was not taking PTA.  Discharge Diagnoses:  Principal Problem:   UTI (urinary tract infection)   30 Day Unplanned Readmission Risk Score    Flowsheet Row ED to Hosp-Admission  (Current) from 11/28/2023 in [facility]  30 Day Unplanned Readmission Risk Score (%) 15.29 Filed at 12/03/2023 1200    This score is the patient's risk of an unplanned readmission within 30 days of being discharged (0 -100%). The score is based on dignosis, age, lab data, medications, orders, and past utilization.   Low:  0-14.9   Medium: 15-21.9   High: 22-29.9   Extreme: 30 and above         Discharge Instructions:  Allergies as of 12/03/2023       Reactions   Codeine Nausea And Vomiting, Other (See Comments)   Morphine Hives, Swelling, Other (See Comments)   Tape Other (See Comments)   blisters blisters   Tramadol  Other (See Comments)   Hallucinations        Medication List     STOP taking these medications    cephALEXin  500 MG capsule Commonly known as: KEFLEX    ketoconazole  2 % cream Commonly known as: NIZORAL    KlonoPIN  0.5 MG tablet Generic drug: clonazePAM    metoprolol  succinate 50 MG 24 hr tablet Commonly known as: TOPROL -XL   midodrine  5 MG tablet Commonly known as: PROAMATINE    nitrofurantoin  (macrocrystal-monohydrate) 100 MG capsule Commonly known as: Macrobid        TAKE these medications    atorvastatin  20 MG tablet Commonly known as: LIPITOR TAKE 1 TABLET BY MOUTH EVERY EVENING   calcium  carbonate 500 MG chewable tablet Commonly known as: TUMS - dosed in mg elemental calcium  Chew 1 tablet (200 mg of elemental calcium  total) by mouth 3 (  three) times daily with meals.   Carbidopa -Levodopa  ER 25-100 MG tablet controlled release Commonly known as: SINEMET  CR Take 1 tablet by mouth 2 (two) times daily.   famotidine  20 MG tablet Commonly known as: PEPCID  Take 1 tablet (20 mg total) by mouth every evening. For acid reflux   imipramine  50 MG tablet Commonly known as: TOFRANIL  TAKE ONE TABLET BY MOUTH TWICE DAILY   levothyroxine  88 MCG tablet Commonly known as: SYNTHROID  Take 0.5 tablets (44 mcg  total) by mouth daily.   multivitamin with minerals Tabs tablet Take 1 tablet by mouth daily. What changed: when to take this   omeprazole  20 MG capsule Commonly known as: PRILOSEC TAKE 1 CAPSULE BY MOUTH ONCE DAILY   sertraline  100 MG tablet Commonly known as: ZOLOFT  TAKE ONE TABLET BY MOUTH DAILY   Vitamin D -3 25 MCG (1000 UT) Caps Take 2 capsules by mouth daily.         Contact information for follow-up providers     Fisher, Nancyann BRAVO, MD Follow up.   Specialty: Family Medicine Contact information: [address] [place] [address] [place] [address] [phone]              Contact information for after-discharge care     Destination     [facility] [place], [place]   Service: Skilled Nursing Contact information: [address] [place]  [address] [phone]                     Allergies  Allergen Reactions   Codeine Nausea And Vomiting and Other (See Comments)   Morphine Hives, Swelling and Other (See Comments)   Tape Other (See Comments)    blisters blisters   Tramadol  Other (See Comments)    Hallucinations     The results of significant diagnostics from this hospitalization (including imaging, microbiology, ancillary and laboratory) are listed below for reference.   Consultations:   Procedures/Studies: CT Head Wo Contrast Result Date: 11/28/2023 CLINICAL DATA:  Weakness and altered mental status EXAM: CT HEAD WITHOUT CONTRAST TECHNIQUE: Contiguous axial images were obtained from the base of the skull through the vertex without intravenous contrast. RADIATION DOSE REDUCTION: This exam was performed according to the departmental dose-optimization program which includes automated exposure control, adjustment of the mA and/or kV according to patient size and/or use of iterative reconstruction technique. COMPARISON:  11/17/2023 FINDINGS: Brain: No evidence of acute infarction, hemorrhage, hydrocephalus, extra-axial  collection or mass lesion/mass effect. Mild atrophic changes are again seen. Mild chronic white matter ischemic changes noted as well. Vascular: No hyperdense vessel or unexpected calcification. Skull: Normal. Negative for fracture or focal lesion. Sinuses/Orbits: No acute finding. Other: None. IMPRESSION: Chronic atrophic and ischemic changes without acute abnormality. Electronically Signed   By: Oneil Devonshire M.D.   On: 11/28/2023 22:33   CT Head Wo Contrast Result Date: 11/17/2023 CLINICAL DATA:  Head neck trauma with mental status change. EXAM: CT HEAD WITHOUT CONTRAST CT CERVICAL SPINE WITHOUT CONTRAST TECHNIQUE: Multidetector CT imaging of the head and cervical spine was performed following the standard protocol without intravenous contrast. Multiplanar CT image reconstructions of the cervical spine were also generated. RADIATION DOSE REDUCTION: This exam was performed according to the departmental dose-optimization program which includes automated exposure control, adjustment of the mA and/or kV according to patient size and/or use of iterative reconstruction technique. COMPARISON:  CT 04/03/2021, MRI 11/17/2021 FINDINGS: CT HEAD FINDINGS Brain: Ventricles, cisterns and other CSF spaces are within normal. Stable known meningioma  over the lower lobes left frontal convexity. No evidence of mass effect or midline shift. No acute hemorrhage. Minimal chronic ischemic microvascular disease is present. Vascular: No hyperdense vessel or unexpected calcification. Skull: Normal. Negative for fracture or focal lesion. Sinuses/Orbits: Orbits are normal. Paranasal sinuses are clear. Hypoplastic frontal sinus. Other: None. CT CERVICAL SPINE FINDINGS Alignment: No posttraumatic subluxation. Mild straightening of the normal cervical lordosis. Skull base and vertebrae: Vertebral body heights are maintained. There is mild spondylosis throughout the cervical spine to include uncovertebral joint spurring and facet arthropathy.  No acute fracture. Mild bilateral neural foraminal narrowing at the C4-5 and C5-6 levels. Atlantoaxial articulation is unremarkable. Soft tissues and spinal canal: No prevertebral fluid or swelling. No visible canal hematoma. Disc levels: Minimal disc space narrowing at the C4-5, C5-6 and C6-7 levels. Upper chest: No acute findings. Other: None. IMPRESSION: 1. No acute brain injury. 2. Stable known meningioma over the left frontal convexity. 3. No acute cervical spine injury. 4. Mild spondylosis of the cervical spine with minimal disc disease at the C4-5, C5-6 and C6-7 levels. Mild bilateral neural foraminal narrowing at the C4-5 and C5-6 levels. Electronically Signed   By: Toribio Agreste M.D.   On: 11/17/2023 16:19   CT Cervical Spine Wo Contrast Result Date: 11/17/2023 CLINICAL DATA:  Head neck trauma with mental status change. EXAM: CT HEAD WITHOUT CONTRAST CT CERVICAL SPINE WITHOUT CONTRAST TECHNIQUE: Multidetector CT imaging of the head and cervical spine was performed following the standard protocol without intravenous contrast. Multiplanar CT image reconstructions of the cervical spine were also generated. RADIATION DOSE REDUCTION: This exam was performed according to the departmental dose-optimization program which includes automated exposure control, adjustment of the mA and/or kV according to patient size and/or use of iterative reconstruction technique. COMPARISON:  CT 04/03/2021, MRI 11/17/2021 FINDINGS: CT HEAD FINDINGS Brain: Ventricles, cisterns and other CSF spaces are within normal. Stable known meningioma over the lower lobes left frontal convexity. No evidence of mass effect or midline shift. No acute hemorrhage. Minimal chronic ischemic microvascular disease is present. Vascular: No hyperdense vessel or unexpected calcification. Skull: Normal. Negative for fracture or focal lesion. Sinuses/Orbits: Orbits are normal. Paranasal sinuses are clear. Hypoplastic frontal sinus. Other: None. CT CERVICAL  SPINE FINDINGS Alignment: No posttraumatic subluxation. Mild straightening of the normal cervical lordosis. Skull base and vertebrae: Vertebral body heights are maintained. There is mild spondylosis throughout the cervical spine to include uncovertebral joint spurring and facet arthropathy. No acute fracture. Mild bilateral neural foraminal narrowing at the C4-5 and C5-6 levels. Atlantoaxial articulation is unremarkable. Soft tissues and spinal canal: No prevertebral fluid or swelling. No visible canal hematoma. Disc levels: Minimal disc space narrowing at the C4-5, C5-6 and C6-7 levels. Upper chest: No acute findings. Other: None. IMPRESSION: 1. No acute brain injury. 2. Stable known meningioma over the left frontal convexity. 3. No acute cervical spine injury. 4. Mild spondylosis of the cervical spine with minimal disc disease at the C4-5, C5-6 and C6-7 levels. Mild bilateral neural foraminal narrowing at the C4-5 and C5-6 levels. Electronically Signed   By: Toribio Agreste M.D.   On: 11/17/2023 16:19      Labs: BNP (last 3 results) No results for input(s): BNP in the last 8760 hours. Basic Metabolic Panel: Recent Labs  Lab 11/28/23 1705 11/29/23 0331 11/30/23 0545  NA 137 140  --   K 3.5 2.7* 3.7  CL 102 104  --   CO2 23 25  --   GLUCOSE 89 109*  --  BUN 8 8  --   CREATININE 0.74 0.89  --   CALCIUM  9.4 8.9  --   MG  --   --  2.0   Liver Function Tests: Recent Labs  Lab 11/28/23 1705 11/29/23 0331  AST 24 22  ALT 10 12  ALKPHOS 95 91  BILITOT 1.3* 1.1  PROT 6.6 6.2*  ALBUMIN 3.4* 3.1*   No results for input(s): LIPASE, AMYLASE in the last 168 hours. No results for input(s): AMMONIA in the last 168 hours. CBC: Recent Labs  Lab 11/28/23 1705 11/29/23 0331  WBC 5.1 5.9  HGB 13.9 13.2  HCT 42.3 40.9  MCV 84.3 84.9  PLT 224 207   Cardiac Enzymes: No results for input(s): CKTOTAL, CKMB, CKMBINDEX, TROPONINI in the last 168 hours. BNP: Invalid input(s):  POCBNP CBG: No results for input(s): GLUCAP in the last 168 hours. D-Dimer No results for input(s): DDIMER in the last 72 hours. Hgb A1c No results for input(s): HGBA1C in the last 72 hours. Lipid Profile No results for input(s): CHOL, HDL, LDLCALC, TRIG, CHOLHDL, LDLDIRECT in the last 72 hours. Thyroid  function studies No results for input(s): TSH, T4TOTAL, T3FREE, THYROIDAB in the last 72 hours.  Invalid input(s): FREET3 Anemia work up No results for input(s): VITAMINB12, FOLATE, FERRITIN, TIBC, IRON, RETICCTPCT in the last 72 hours. Urinalysis    Component Value Date/Time   COLORURINE AMBER (A) 11/28/2023 2221   APPEARANCEUR HAZY (A) 11/28/2023 2221   LABSPEC 1.021 11/28/2023 2221   PHURINE 5.0 11/28/2023 2221   GLUCOSEU NEGATIVE 11/28/2023 2221   HGBUR SMALL (A) 11/28/2023 2221   BILIRUBINUR NEGATIVE 11/28/2023 2221   BILIRUBINUR Negative 11/15/2023 1149   KETONESUR 20 (A) 11/28/2023 2221   PROTEINUR 30 (A) 11/28/2023 2221   UROBILINOGEN 0.2 11/15/2023 1149   NITRITE NEGATIVE 11/28/2023 2221   LEUKOCYTESUR SMALL (A) 11/28/2023 2221   Sepsis Labs Recent Labs  Lab 11/28/23 1705 11/29/23 0331  WBC 5.1 5.9   Microbiology Recent Results (from the past 240 hours)  Urine Culture     Status: Abnormal   Collection Time: 11/28/23 10:21 PM   Specimen: In/Out Cath Urine  Result Value Ref Range Status   Specimen Description   Final    IN/OUT CATH URINE Performed at [facility], [address], [place], [place] [address]    Special Requests   Final    NONE Performed at [facility], [address] [place], [place] [address]    Culture >=100,000 COLONIES/mL YEAST (A)  Final   Report Status 11/30/2023 FINAL  Final  Urine Culture (for pregnant, neutropenic or urologic patients or patients with an indwelling urinary catheter)     Status: Abnormal   Collection Time: 11/29/23  3:30 AM   Specimen: Urine, Clean  Catch  Result Value Ref Range Status   Specimen Description   Final    URINE, CLEAN CATCH Performed at [facility], [address], [place], [place] [address]    Special Requests   Final    NONE Performed at [facility], [address] [place], [place] [address]    Culture (A)  Final    <10,000 COLONIES/mL INSIGNIFICANT GROWTH Performed at [facility], [address], [place], [place] [address]    Report Status 12/01/2023 FINAL  Final     Total time spend on discharging this patient, including the last patient exam, discussing the hospital stay, instructions for ongoing care as it relates to all pertinent caregivers,  as well as preparing the medical discharge records, prescriptions, and/or referrals as applicable, is 35 minutes.    Ellouise Haber, MD  [facility] 12/03/2023, 2:20 PM

## 2023-12-04 DIAGNOSIS — K2100 Gastro-esophageal reflux disease with esophagitis, without bleeding: Secondary | ICD-10-CM | POA: Diagnosis not present

## 2023-12-05 DIAGNOSIS — K219 Gastro-esophageal reflux disease without esophagitis: Secondary | ICD-10-CM | POA: Diagnosis not present

## 2023-12-05 DIAGNOSIS — G20A1 Parkinson's disease without dyskinesia, without mention of fluctuations: Secondary | ICD-10-CM | POA: Diagnosis not present

## 2023-12-06 DIAGNOSIS — I10 Essential (primary) hypertension: Secondary | ICD-10-CM | POA: Diagnosis not present

## 2023-12-06 DIAGNOSIS — D509 Iron deficiency anemia, unspecified: Secondary | ICD-10-CM | POA: Diagnosis not present

## 2023-12-06 DIAGNOSIS — F419 Anxiety disorder, unspecified: Secondary | ICD-10-CM

## 2023-12-06 DIAGNOSIS — G20A1 Parkinson's disease without dyskinesia, without mention of fluctuations: Secondary | ICD-10-CM | POA: Diagnosis not present

## 2023-12-06 DIAGNOSIS — E039 Hypothyroidism, unspecified: Secondary | ICD-10-CM | POA: Diagnosis not present

## 2023-12-06 DIAGNOSIS — I951 Orthostatic hypotension: Secondary | ICD-10-CM | POA: Diagnosis not present

## 2023-12-06 DIAGNOSIS — N390 Urinary tract infection, site not specified: Secondary | ICD-10-CM | POA: Diagnosis not present

## 2023-12-06 DIAGNOSIS — E66811 Obesity, class 1: Secondary | ICD-10-CM

## 2023-12-06 DIAGNOSIS — F322 Major depressive disorder, single episode, severe without psychotic features: Secondary | ICD-10-CM | POA: Diagnosis not present

## 2023-12-06 DIAGNOSIS — E538 Deficiency of other specified B group vitamins: Secondary | ICD-10-CM | POA: Diagnosis not present

## 2023-12-06 DIAGNOSIS — Z9181 History of falling: Secondary | ICD-10-CM | POA: Diagnosis not present

## 2023-12-09 DIAGNOSIS — R300 Dysuria: Secondary | ICD-10-CM | POA: Diagnosis not present

## 2023-12-09 DIAGNOSIS — R410 Disorientation, unspecified: Secondary | ICD-10-CM | POA: Diagnosis not present

## 2023-12-13 DIAGNOSIS — N3001 Acute cystitis with hematuria: Secondary | ICD-10-CM | POA: Diagnosis not present

## 2023-12-16 DIAGNOSIS — G20A1 Parkinson's disease without dyskinesia, without mention of fluctuations: Secondary | ICD-10-CM | POA: Diagnosis not present

## 2023-12-18 ENCOUNTER — Ambulatory Visit: Admitting: Family Medicine

## 2023-12-18 DIAGNOSIS — G4700 Insomnia, unspecified: Secondary | ICD-10-CM | POA: Diagnosis not present

## 2023-12-18 DIAGNOSIS — G20A1 Parkinson's disease without dyskinesia, without mention of fluctuations: Secondary | ICD-10-CM | POA: Diagnosis not present

## 2023-12-24 ENCOUNTER — Telehealth: Payer: Self-pay

## 2023-12-24 NOTE — Transitions of Care (Post Inpatient/ED Visit) (Signed)
   12/24/2023  Name: Brittany [place] MRN: [id number] DOB: [birth year]  Today's TOC FU Call Status: Unsuccessful Call (1st Attempt) Date: 12/24/23 Patient's Name and Date of Birth confirmed.  Transition Care Management Follow-up Telephone Call    Items Reviewed:    Medications Reviewed Today: Medications Reviewed Today   Medications were not reviewed in this encounter     Home Care and Equipment/Supplies:    Functional Questionnaire:    Follow up appointments reviewed: PCP Follow-up appointment confirmed?: No (patient in SNF and see PCP there)    SIGNATURE Julian Lemmings, LPN [facility] Nurse Health Advisor Direct Dial [phone]

## 2023-12-27 DIAGNOSIS — G20C Parkinsonism, unspecified: Secondary | ICD-10-CM | POA: Diagnosis not present

## 2023-12-27 DIAGNOSIS — R269 Unspecified abnormalities of gait and mobility: Secondary | ICD-10-CM | POA: Diagnosis not present

## 2023-12-27 DIAGNOSIS — R296 Repeated falls: Secondary | ICD-10-CM | POA: Diagnosis not present

## 2023-12-31 DIAGNOSIS — R296 Repeated falls: Secondary | ICD-10-CM | POA: Diagnosis not present

## 2023-12-31 DIAGNOSIS — R269 Unspecified abnormalities of gait and mobility: Secondary | ICD-10-CM | POA: Diagnosis not present

## 2023-12-31 DIAGNOSIS — G20C Parkinsonism, unspecified: Secondary | ICD-10-CM | POA: Diagnosis not present

## 2024-01-02 DIAGNOSIS — G20C Parkinsonism, unspecified: Secondary | ICD-10-CM | POA: Diagnosis not present

## 2024-01-02 DIAGNOSIS — R296 Repeated falls: Secondary | ICD-10-CM | POA: Diagnosis not present

## 2024-01-03 DIAGNOSIS — G20C Parkinsonism, unspecified: Secondary | ICD-10-CM | POA: Diagnosis not present

## 2024-01-03 DIAGNOSIS — R296 Repeated falls: Secondary | ICD-10-CM | POA: Diagnosis not present

## 2024-01-07 DIAGNOSIS — G20C Parkinsonism, unspecified: Secondary | ICD-10-CM | POA: Diagnosis not present

## 2024-01-07 DIAGNOSIS — R269 Unspecified abnormalities of gait and mobility: Secondary | ICD-10-CM | POA: Diagnosis not present

## 2024-01-07 DIAGNOSIS — R296 Repeated falls: Secondary | ICD-10-CM | POA: Diagnosis not present

## 2024-01-08 ENCOUNTER — Other Ambulatory Visit: Payer: Self-pay

## 2024-01-08 ENCOUNTER — Ambulatory Visit: Payer: Self-pay

## 2024-01-08 ENCOUNTER — Emergency Department
Admission: EM | Admit: 2024-01-08 | Discharge: 2024-01-08 | Disposition: A | Discharge status: Home / Self Care | Attending: Emergency Medicine | Admitting: Emergency Medicine

## 2024-01-08 ENCOUNTER — Encounter: Payer: Self-pay | Admitting: Emergency Medicine

## 2024-01-08 DIAGNOSIS — E876 Hypokalemia: Secondary | ICD-10-CM | POA: Insufficient documentation

## 2024-01-08 DIAGNOSIS — R269 Unspecified abnormalities of gait and mobility: Secondary | ICD-10-CM | POA: Diagnosis not present

## 2024-01-08 DIAGNOSIS — Z85528 Personal history of other malignant neoplasm of kidney: Secondary | ICD-10-CM | POA: Insufficient documentation

## 2024-01-08 DIAGNOSIS — G20C Parkinsonism, unspecified: Secondary | ICD-10-CM | POA: Diagnosis not present

## 2024-01-08 DIAGNOSIS — G20A1 Parkinson's disease without dyskinesia, without mention of fluctuations: Secondary | ICD-10-CM | POA: Insufficient documentation

## 2024-01-08 DIAGNOSIS — E119 Type 2 diabetes mellitus without complications: Secondary | ICD-10-CM | POA: Insufficient documentation

## 2024-01-08 DIAGNOSIS — R300 Dysuria: Secondary | ICD-10-CM

## 2024-01-08 DIAGNOSIS — R296 Repeated falls: Secondary | ICD-10-CM | POA: Diagnosis not present

## 2024-01-08 DIAGNOSIS — I10 Essential (primary) hypertension: Secondary | ICD-10-CM | POA: Insufficient documentation

## 2024-01-08 DIAGNOSIS — N390 Urinary tract infection, site not specified: Secondary | ICD-10-CM | POA: Diagnosis not present

## 2024-01-08 LAB — URINALYSIS, ROUTINE W REFLEX MICROSCOPIC
Bilirubin Urine: NEGATIVE
Glucose, UA: NEGATIVE mg/dL
Ketones, ur: 5 mg/dL — AB
Nitrite: NEGATIVE
Protein, ur: NEGATIVE mg/dL
Specific Gravity, Urine: 1.004 — ABNORMAL LOW (ref 1.005–1.030)
pH: 7 (ref 5.0–8.0)

## 2024-01-08 LAB — BASIC METABOLIC PANEL WITH GFR
Anion gap: 12 (ref 5–15)
BUN: 7 mg/dL — ABNORMAL LOW (ref 8–23)
CO2: 26 mmol/L (ref 22–32)
Calcium: 8.3 mg/dL — ABNORMAL LOW (ref 8.9–10.3)
Chloride: 99 mmol/L (ref 98–111)
Creatinine, Ser: 0.73 mg/dL (ref 0.44–1.00)
GFR, Estimated: >60 mL/min (ref >60)
Glucose, Bld: 83 mg/dL (ref 70–99)
Potassium: 2.6 mmol/L — CL (ref 3.5–5.1)
Sodium: 137 mmol/L (ref 135–145)

## 2024-01-08 LAB — CBC
HCT: 36.9 % (ref 36.0–46.0)
Hemoglobin: 12.3 g/dL (ref 12.0–15.0)
MCH: 27 pg (ref 26.0–34.0)
MCHC: 33.3 g/dL (ref 30.0–36.0)
MCV: 80.9 fL (ref 80.0–100.0)
Platelets: 268 K/uL (ref 150–400)
RBC: 4.56 MIL/uL (ref 3.87–5.11)
RDW: 14.3 % (ref 11.5–15.5)
WBC: 4.5 K/uL (ref 4.0–10.5)
nRBC: 0 % (ref 0.0–0.2)

## 2024-01-08 MED ORDER — CEPHALEXIN 500 MG PO CAPS
500.0000 mg | ORAL_CAPSULE | Freq: Two times a day (BID) | ORAL | 0 refills | Status: AC
Start: 2024-01-08 — End: 2024-01-15

## 2024-01-08 MED ORDER — POTASSIUM CHLORIDE IN NACL 20-0.9 MEQ/L-% IV SOLN
Freq: Once | INTRAVENOUS | Status: AC
Start: 2024-01-08 — End: 2024-01-08
  Filled 2024-01-08: qty 1000

## 2024-01-08 MED ORDER — POTASSIUM CHLORIDE 20 MEQ PO PACK
40.0000 meq | PACK | Freq: Once | ORAL | Status: AC
  Administered 2024-01-08: 40 meq via ORAL
  Filled 2024-01-08: qty 2

## 2024-01-08 MED ORDER — SODIUM CHLORIDE 0.9 % IV SOLN
1.0000 g | Freq: Once | INTRAVENOUS | Status: AC
  Administered 2024-01-08: 1 g via INTRAVENOUS
  Filled 2024-01-08: qty 10

## 2024-01-08 NOTE — Discharge Instructions (Addendum)
 Take the antibiotic as prescribed and finish the full course.  Return to the ER for new, worsening, or persistent  urinary symptoms, abdominal pain, weakness, lightheadedness, vomiting, fever, or any other new or worsening symptoms that concern you.

## 2024-01-08 NOTE — Telephone Encounter (Signed)
 FYI Only or Action Required?: FYI only for provider.  Patient was last seen in primary care on 11/14/2023 by Ostwalt, Janna, PA-C.  Called Nurse Triage reporting Hematuria and Rash.  Symptoms began several days ago.  Interventions attempted: Rest, hydration, or home remedies.  Symptoms are: gradually worsening.  Triage Disposition: Go to ED Now (Notify PCP)  Patient/caregiver understands and will follow disposition?: Yes   Copied from CRM #[id number]. Topic: Clinical - Red Word Triage >> Jan 08, 2024 10:36 AM Myrick T wrote: Red Word that prompted transfer to Nurse Triage: patient says she has an out of control rash under her stomach, under her breast and on her abdomen. She also says she may have a UTI as she has only used the batheroom 1x a day for the past 4 days and it has blood in it. Reason for Disposition  [1] Unable to urinate (or only a few drops) > 4 hours AND [2] bladder feels very full (e.g., palpable bladder or strong urge to urinate)  Answer Assessment - Initial Assessment Questions Additional info: Patient requesting antibiotics for uti and oral rx for persistent rash. Er advised for urinary retention X 3 days.    1. COLOR of URINE: Describe the color of the urine.  (e.g., tea-colored, pink, red, bloody) Do you have blood clots in your urine? (e.g., none, pea, grape, small coin)     Blood in urine 2. ONSET: When did the bleeding start?      4 days ago 3. EPISODES: How many times has there been blood in the urine? or How many times today?     1 4. PAIN with URINATION: Is there any pain with passing your urine? If Yes, ask: How bad is the pain?  (Scale 1-10; or mild, moderate, severe)     Denies  5. FEVER: Do you have a fever? If Yes, ask: What is your temperature, how was it measured, and when did it start?     Denies  6. ASSOCIATED SYMPTOMS: Are you passing urine more frequently than usual?     Retention. Past 3 days only voiding one per day.   7. OTHER SYMPTOMS: Do you have any other symptoms? (e.g., back/flank pain, abdomen pain, vomiting)     Moist red rash on abdomen and under breast-on rx cream. Bladder pressure, feels full but not voiding.  Protocols used: Urine - Blood In-A-AH

## 2024-01-08 NOTE — ED Provider Notes (Signed)
 [facility] Provider Note    Event Date/Time   First MD Initiated Contact with Patient 01/08/24 1322     (approximate)   History   Chief Complaint: Dysuria   HPI  Brittany [place] is a 68 y.o. female with a history of diabetes, GERD, UTI who comes ED complaining of dysuria and urinary urgency, associated loss of appetite, poor oral intake for last few days, fatigue.  Denies dizziness chest pain shortness of breath fever or falls.  States that she has a history of sepsis from UTI but currently is feeling only mildly unwell.        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   GERD (gastroesophageal reflux disease)    Hernia, incisional    after renal surgery   History of chicken pox    Hyperlipidemia    Hypertension    Parkinson disease (HCC)    Renal cell carcinoma (HCC)    s/p right nephrectomy 1994   Sleep apnea    Thyroid  disease     Current Outpatient Rx   Order #: [id number] Class: Normal   Order #: [id number] Class: No Print   Order #: [id number] Class: Historical Med   Order #: [id number] Class: Historical Med   Order #: [id number] Class: Normal   Order #: [id number] Class: Normal   Order #: [id number] Class: Normal   Order #: [id number] Class: No Print   Order #: [id number] Class: Normal   Order #: [id number] Class: Normal    Past Surgical History:  Procedure Laterality Date   APPENDECTOMY  1994   BIOPSY  11/07/2022   Procedure: BIOPSY;  Surgeon: Therisa Bi, MD;  Location[facility] ENDOSCOPY;  Service: Gastroenterology;;   BREAST BIOPSY Right 1991   Negative   CHOLECYSTECTOMY  1994   COLONOSCOPY WITH PROPOFOL  N/A 11/07/2022   Procedure: COLONOSCOPY WITH PROPOFOL ;  Surgeon: Therisa Bi, MD;  Location: [facility] ENDOSCOPY;  Service: Gastroenterology;  Laterality: N/A;    ESOPHAGOGASTRODUODENOSCOPY (EGD) WITH PROPOFOL  N/A 11/07/2022   Procedure: ESOPHAGOGASTRODUODENOSCOPY (EGD) WITH PROPOFOL ;  Surgeon: Therisa Bi, MD;  Location: [facility] ENDOSCOPY;  Service: Gastroenterology;  Laterality: N/A;   JOINT REPLACEMENT  2019   LAPAROSCOPIC GASTRIC SLEEVE RESECTION  02/02/2016   Dr Thedora at [facility] Med   NEPHRECTOMY  1994   Renal Cell Carcinoma   parotid gland removal  1990   also removed a tumor   POLYPECTOMY  11/07/2022   Procedure: POLYPECTOMY INTESTINAL;  Surgeon: Therisa Bi, MD;  Location: [facility] ENDOSCOPY;  Service: Gastroenterology;;   TONSILLECTOMY     TOTAL HIP ARTHROPLASTY Right 08/08/2016   Procedure: TOTAL HIP ARTHROPLASTY;  Surgeon: Kayla Pinal, MD;  Location: [facility] ORS;  Service: Orthopedics;  Laterality: Right;   TUBAL LIGATION  1979    Physical Exam   Triage Vital Signs: ED Triage Vitals [01/08/24 1220]  Encounter Vitals Group     BP 133/74     Girls Systolic BP Percentile      Girls Diastolic BP Percentile      Boys Systolic BP Percentile      Boys Diastolic BP Percentile      Pulse Rate 99     Resp 17     Temp 98.4 F (36.9 C)     Temp Source Oral     SpO2  100 %     Weight 170 lb (77.1 kg)     Height 5' (1.524 m)     Head Circumference      Peak Flow      Pain Score 0     Pain Loc      Pain Education      Exclude from Growth Chart     Most recent vital signs: Vitals:   01/08/24 1220 01/08/24 1430  BP: 133/74 133/78  Pulse: 99 89  Resp: 17 20  Temp: 98.4 F (36.9 C)   SpO2: 100% 100%    General: Awake, no distress.  CV:  Good peripheral perfusion.  Regular rate rhythm Resp:  Normal effort.  Clear lungs Abd:  No distention.  Soft with suprapubic tenderness and right lower quadrant tenderness.  No CVA tenderness Other:  Dry oral mucosa   ED Results / Procedures / Treatments   Labs (all labs ordered are listed, but only abnormal results are displayed) Labs Reviewed  BASIC METABOLIC PANEL WITH GFR - Abnormal; Notable  for the following components:      Result Value   Potassium 2.6 (*)    BUN 7 (*)    Calcium  8.3 (*)    All other components within normal limits  CBC  URINALYSIS, ROUTINE W REFLEX MICROSCOPIC     EKG Interpreted by me Normal sinus rhythm rate of 95.  Normal axis and intervals.  Normal QRS ST segments T waves   RADIOLOGY    PROCEDURES:  Procedures   MEDICATIONS ORDERED IN ED: Medications  0.9 % NaCl with KCl 20 mEq/ L  infusion (0 mL/hr Intravenous Stopped 01/08/24 1518)  potassium chloride  (KLOR-CON ) packet 40 mEq (40 mEq Oral Given 01/08/24 1447)     IMPRESSION / MDM / ASSESSMENT AND PLAN / ED COURSE  I reviewed the triage vital signs and the nursing notes.  DDx: AKI, dehydration, electrolyte derangement, anemia, UTI  Patient's presentation is most consistent with acute presentation with potential threat to life or bodily function.  Patient comes the ED with urinary symptoms, suprapubic tenderness as well as right lower quadrant tenderness.  Reports a history of bariatric surgery, cholecystectomy, appendectomy, right nephrectomy.  She is nontoxic with normal vital signs.  Will give IV fluids for hydration, potassium supplementation.  If UA negative would plan to CT abdomen pelvis.  If positive, thinks she is suitable for outpatient management and oral antibiotics.       FINAL CLINICAL IMPRESSION(S) / ED DIAGNOSES   Final diagnoses:  Dysuria     Rx / DC Orders   ED Discharge Orders     None        Note:  This document was prepared using Dragon voice recognition software and may include unintentional dictation errors.   Brittany Pastor, MD 01/08/24 [phone]

## 2024-01-08 NOTE — ED Notes (Signed)
Pt assisted to bathroom in room.  

## 2024-01-08 NOTE — ED Triage Notes (Signed)
 Patient to ED via POV for decrease urinary output. Pt states she feels like she is only going once a day and does not feel like she needs to go. States she has been drinking water at home. Denies any burning when she does urinate.

## 2024-01-08 NOTE — ED Notes (Signed)
 Attempted in and out cath, unsuccessful at this time.

## 2024-01-08 NOTE — ED Notes (Signed)
 This tech and NT Niels assisted pt to toilet. Pt with unsteady gait. Pt cleaned up and peri care was provided. Pt placed in a new brief and assisted back to bed. Pt with no further needs at this time.

## 2024-01-08 NOTE — ED Provider Notes (Signed)
-----------------------------------------   4:48 PM on 01/08/2024 -----------------------------------------  I took over care on this patient from Dr. Viviann.  The urinalysis shows findings consistent with a UTI.  The patient states that she is feeling significantly better.  There is no indication for inpatient admission at this time.  I have ordered a dose of IV ceftriaxone  and have prescribed a course of Keflex .  I gave strict return precautions, and the patient expressed understanding.   Jacolyn Pae, MD 01/08/24 [phone]

## 2024-01-09 DIAGNOSIS — B372 Candidiasis of skin and nail: Secondary | ICD-10-CM | POA: Diagnosis not present

## 2024-01-09 DIAGNOSIS — G20A1 Parkinson's disease without dyskinesia, without mention of fluctuations: Secondary | ICD-10-CM | POA: Diagnosis not present

## 2024-01-09 DIAGNOSIS — E782 Mixed hyperlipidemia: Secondary | ICD-10-CM | POA: Diagnosis not present

## 2024-01-09 DIAGNOSIS — E039 Hypothyroidism, unspecified: Secondary | ICD-10-CM | POA: Diagnosis not present

## 2024-01-09 DIAGNOSIS — B353 Tinea pedis: Secondary | ICD-10-CM | POA: Diagnosis not present

## 2024-01-09 DIAGNOSIS — I10 Essential (primary) hypertension: Secondary | ICD-10-CM | POA: Diagnosis not present

## 2024-01-09 DIAGNOSIS — N390 Urinary tract infection, site not specified: Secondary | ICD-10-CM | POA: Diagnosis not present

## 2024-01-10 DIAGNOSIS — G20C Parkinsonism, unspecified: Secondary | ICD-10-CM | POA: Diagnosis not present

## 2024-01-10 DIAGNOSIS — R296 Repeated falls: Secondary | ICD-10-CM | POA: Diagnosis not present

## 2024-01-14 DIAGNOSIS — R269 Unspecified abnormalities of gait and mobility: Secondary | ICD-10-CM | POA: Diagnosis not present

## 2024-01-14 DIAGNOSIS — G20C Parkinsonism, unspecified: Secondary | ICD-10-CM | POA: Diagnosis not present

## 2024-01-14 DIAGNOSIS — R296 Repeated falls: Secondary | ICD-10-CM | POA: Diagnosis not present

## 2024-01-16 DIAGNOSIS — R269 Unspecified abnormalities of gait and mobility: Secondary | ICD-10-CM | POA: Diagnosis not present

## 2024-01-16 DIAGNOSIS — G20C Parkinsonism, unspecified: Secondary | ICD-10-CM | POA: Diagnosis not present

## 2024-01-16 DIAGNOSIS — R296 Repeated falls: Secondary | ICD-10-CM | POA: Diagnosis not present

## 2024-01-17 DIAGNOSIS — G20C Parkinsonism, unspecified: Secondary | ICD-10-CM | POA: Diagnosis not present

## 2024-01-17 DIAGNOSIS — R269 Unspecified abnormalities of gait and mobility: Secondary | ICD-10-CM | POA: Diagnosis not present

## 2024-01-17 DIAGNOSIS — R296 Repeated falls: Secondary | ICD-10-CM | POA: Diagnosis not present

## 2024-01-21 DIAGNOSIS — R269 Unspecified abnormalities of gait and mobility: Secondary | ICD-10-CM | POA: Diagnosis not present

## 2024-01-21 DIAGNOSIS — R296 Repeated falls: Secondary | ICD-10-CM | POA: Diagnosis not present

## 2024-01-21 DIAGNOSIS — G20C Parkinsonism, unspecified: Secondary | ICD-10-CM | POA: Diagnosis not present

## 2024-01-23 DIAGNOSIS — M6281 Muscle weakness (generalized): Secondary | ICD-10-CM | POA: Diagnosis not present

## 2024-01-23 DIAGNOSIS — R531 Weakness: Secondary | ICD-10-CM | POA: Diagnosis not present

## 2024-01-23 DIAGNOSIS — R54 Age-related physical debility: Secondary | ICD-10-CM | POA: Diagnosis not present

## 2024-01-23 DIAGNOSIS — G20A1 Parkinson's disease without dyskinesia, without mention of fluctuations: Secondary | ICD-10-CM | POA: Diagnosis not present

## 2024-01-23 DIAGNOSIS — F809 Developmental disorder of speech and language, unspecified: Secondary | ICD-10-CM | POA: Diagnosis not present

## 2024-01-24 DIAGNOSIS — G20A1 Parkinson's disease without dyskinesia, without mention of fluctuations: Secondary | ICD-10-CM | POA: Diagnosis not present

## 2024-01-24 DIAGNOSIS — Z5181 Encounter for therapeutic drug level monitoring: Secondary | ICD-10-CM | POA: Diagnosis not present

## 2024-01-27 DIAGNOSIS — R54 Age-related physical debility: Secondary | ICD-10-CM | POA: Diagnosis not present

## 2024-01-27 DIAGNOSIS — M6281 Muscle weakness (generalized): Secondary | ICD-10-CM | POA: Diagnosis not present

## 2024-01-30 DIAGNOSIS — R296 Repeated falls: Secondary | ICD-10-CM | POA: Diagnosis not present

## 2024-01-30 DIAGNOSIS — R269 Unspecified abnormalities of gait and mobility: Secondary | ICD-10-CM | POA: Diagnosis not present

## 2024-01-30 DIAGNOSIS — G20C Parkinsonism, unspecified: Secondary | ICD-10-CM | POA: Diagnosis not present

## 2024-02-04 DIAGNOSIS — G20C Parkinsonism, unspecified: Secondary | ICD-10-CM | POA: Diagnosis not present

## 2024-02-04 DIAGNOSIS — Z5181 Encounter for therapeutic drug level monitoring: Secondary | ICD-10-CM | POA: Diagnosis not present

## 2024-02-04 DIAGNOSIS — R269 Unspecified abnormalities of gait and mobility: Secondary | ICD-10-CM | POA: Diagnosis not present

## 2024-02-04 DIAGNOSIS — R296 Repeated falls: Secondary | ICD-10-CM | POA: Diagnosis not present

## 2024-02-06 DIAGNOSIS — R269 Unspecified abnormalities of gait and mobility: Secondary | ICD-10-CM | POA: Diagnosis not present

## 2024-02-06 DIAGNOSIS — G20C Parkinsonism, unspecified: Secondary | ICD-10-CM | POA: Diagnosis not present

## 2024-02-06 DIAGNOSIS — R296 Repeated falls: Secondary | ICD-10-CM | POA: Diagnosis not present

## 2024-02-10 DIAGNOSIS — R54 Age-related physical debility: Secondary | ICD-10-CM | POA: Diagnosis not present

## 2024-02-10 DIAGNOSIS — M6281 Muscle weakness (generalized): Secondary | ICD-10-CM | POA: Diagnosis not present

## 2024-02-11 DIAGNOSIS — G20C Parkinsonism, unspecified: Secondary | ICD-10-CM | POA: Diagnosis not present

## 2024-02-11 DIAGNOSIS — R296 Repeated falls: Secondary | ICD-10-CM | POA: Diagnosis not present

## 2024-02-11 DIAGNOSIS — R269 Unspecified abnormalities of gait and mobility: Secondary | ICD-10-CM | POA: Diagnosis not present

## 2024-02-13 DIAGNOSIS — G20C Parkinsonism, unspecified: Secondary | ICD-10-CM | POA: Diagnosis not present

## 2024-02-13 DIAGNOSIS — R269 Unspecified abnormalities of gait and mobility: Secondary | ICD-10-CM | POA: Diagnosis not present

## 2024-02-13 DIAGNOSIS — R296 Repeated falls: Secondary | ICD-10-CM | POA: Diagnosis not present
